# Patient Record
Sex: Female | Born: 1955 | Race: Black or African American | Hispanic: No | Marital: Single | Smoking: Never smoker
Health system: Southern US, Community
[De-identification: ages and names within clinical notes are randomized; demographics above are authoritative.]

## PROBLEM LIST (undated history)

## (undated) DIAGNOSIS — M722 Plantar fascial fibromatosis: Secondary | ICD-10-CM

## (undated) DIAGNOSIS — A64 Unspecified sexually transmitted disease: Secondary | ICD-10-CM

## (undated) DIAGNOSIS — E119 Type 2 diabetes mellitus without complications: Secondary | ICD-10-CM

## (undated) DIAGNOSIS — H269 Unspecified cataract: Secondary | ICD-10-CM

## (undated) DIAGNOSIS — R87619 Unspecified abnormal cytological findings in specimens from cervix uteri: Secondary | ICD-10-CM

## (undated) DIAGNOSIS — E785 Hyperlipidemia, unspecified: Secondary | ICD-10-CM

## (undated) DIAGNOSIS — Z9221 Personal history of antineoplastic chemotherapy: Secondary | ICD-10-CM

## (undated) DIAGNOSIS — Z923 Personal history of irradiation: Secondary | ICD-10-CM

## (undated) DIAGNOSIS — D649 Anemia, unspecified: Secondary | ICD-10-CM

## (undated) HISTORY — DX: Unspecified sexually transmitted disease: A64

## (undated) HISTORY — PX: COLONOSCOPY: SHX174

## (undated) HISTORY — DX: Unspecified abnormal cytological findings in specimens from cervix uteri: R87.619

## (undated) HISTORY — DX: Unspecified cataract: H26.9

## (undated) HISTORY — DX: Hyperlipidemia, unspecified: E78.5

## (undated) HISTORY — PX: EYE SURGERY: SHX253

## (undated) HISTORY — DX: Plantar fascial fibromatosis: M72.2

## (undated) HISTORY — DX: Anemia, unspecified: D64.9

## (undated) HISTORY — DX: Type 2 diabetes mellitus without complications: E11.9

---

## 1993-07-12 DIAGNOSIS — O24419 Gestational diabetes mellitus in pregnancy, unspecified control: Secondary | ICD-10-CM

## 1997-09-26 ENCOUNTER — Encounter: Admission: RE | Admit: 1997-09-26 | Discharge: 1997-12-25 | Payer: Self-pay | Admitting: *Deleted

## 1997-10-10 ENCOUNTER — Encounter: Admission: RE | Admit: 1997-10-10 | Discharge: 1997-10-10 | Payer: Self-pay | Admitting: Family Medicine

## 1997-10-11 ENCOUNTER — Ambulatory Visit (HOSPITAL_COMMUNITY): Admission: RE | Admit: 1997-10-11 | Discharge: 1997-10-11 | Payer: Self-pay | Admitting: *Deleted

## 1998-01-14 ENCOUNTER — Encounter: Admission: RE | Admit: 1998-01-14 | Discharge: 1998-01-14 | Payer: Self-pay | Admitting: Family Medicine

## 1998-01-31 ENCOUNTER — Encounter: Admission: RE | Admit: 1998-01-31 | Discharge: 1998-01-31 | Payer: Self-pay | Admitting: Family Medicine

## 1998-05-21 ENCOUNTER — Encounter: Admission: RE | Admit: 1998-05-21 | Discharge: 1998-05-21 | Payer: Self-pay | Admitting: Family Medicine

## 1998-10-16 ENCOUNTER — Encounter: Admission: RE | Admit: 1998-10-16 | Discharge: 1998-10-16 | Payer: Self-pay | Admitting: Family Medicine

## 1998-12-05 ENCOUNTER — Encounter: Admission: RE | Admit: 1998-12-05 | Discharge: 1998-12-05 | Payer: Self-pay | Admitting: Family Medicine

## 1999-01-30 ENCOUNTER — Encounter: Admission: RE | Admit: 1999-01-30 | Discharge: 1999-01-30 | Payer: Self-pay | Admitting: Family Medicine

## 1999-02-04 ENCOUNTER — Encounter (INDEPENDENT_AMBULATORY_CARE_PROVIDER_SITE_OTHER): Payer: Self-pay | Admitting: Specialist

## 1999-02-04 ENCOUNTER — Other Ambulatory Visit: Admission: RE | Admit: 1999-02-04 | Discharge: 1999-02-04 | Payer: Self-pay | Admitting: Obstetrics and Gynecology

## 1999-02-04 HISTORY — PX: COLPOSCOPY W/ BIOPSY / CURETTAGE: SUR283

## 1999-03-10 ENCOUNTER — Encounter: Admission: RE | Admit: 1999-03-10 | Discharge: 1999-03-10 | Payer: Self-pay | Admitting: Family Medicine

## 1999-07-20 ENCOUNTER — Encounter: Payer: Self-pay | Admitting: Obstetrics and Gynecology

## 1999-07-20 ENCOUNTER — Ambulatory Visit (HOSPITAL_COMMUNITY): Admission: RE | Admit: 1999-07-20 | Discharge: 1999-07-20 | Payer: Self-pay | Admitting: Obstetrics and Gynecology

## 1999-11-17 ENCOUNTER — Other Ambulatory Visit: Admission: RE | Admit: 1999-11-17 | Discharge: 1999-11-17 | Payer: Self-pay | Admitting: *Deleted

## 1999-11-26 ENCOUNTER — Other Ambulatory Visit: Admission: RE | Admit: 1999-11-26 | Discharge: 1999-11-26 | Payer: Self-pay | Admitting: Obstetrics and Gynecology

## 2000-02-02 ENCOUNTER — Encounter: Admission: RE | Admit: 2000-02-02 | Discharge: 2000-02-02 | Payer: Self-pay | Admitting: Family Medicine

## 2000-02-02 ENCOUNTER — Ambulatory Visit (HOSPITAL_COMMUNITY): Admission: RE | Admit: 2000-02-02 | Discharge: 2000-02-02 | Payer: Self-pay | Admitting: Family Medicine

## 2000-02-03 ENCOUNTER — Encounter: Admission: RE | Admit: 2000-02-03 | Discharge: 2000-02-03 | Payer: Self-pay | Admitting: *Deleted

## 2000-02-03 ENCOUNTER — Encounter: Payer: Self-pay | Admitting: *Deleted

## 2000-02-24 ENCOUNTER — Encounter: Payer: Self-pay | Admitting: Cardiology

## 2000-02-24 ENCOUNTER — Ambulatory Visit (HOSPITAL_COMMUNITY): Admission: RE | Admit: 2000-02-24 | Discharge: 2000-02-24 | Payer: Self-pay | Admitting: Cardiology

## 2000-04-13 ENCOUNTER — Encounter: Admission: RE | Admit: 2000-04-13 | Discharge: 2000-04-13 | Payer: Self-pay | Admitting: Family Medicine

## 2000-05-30 ENCOUNTER — Encounter: Admission: RE | Admit: 2000-05-30 | Discharge: 2000-05-30 | Payer: Self-pay | Admitting: Family Medicine

## 2000-07-26 ENCOUNTER — Other Ambulatory Visit: Admission: RE | Admit: 2000-07-26 | Discharge: 2000-07-26 | Payer: Self-pay | Admitting: *Deleted

## 2000-09-12 ENCOUNTER — Encounter: Admission: RE | Admit: 2000-09-12 | Discharge: 2000-09-12 | Payer: Self-pay | Admitting: Family Medicine

## 2000-11-01 ENCOUNTER — Other Ambulatory Visit: Admission: RE | Admit: 2000-11-01 | Discharge: 2000-11-01 | Payer: Self-pay | Admitting: Obstetrics and Gynecology

## 2000-11-16 ENCOUNTER — Encounter: Admission: RE | Admit: 2000-11-16 | Discharge: 2000-11-16 | Payer: Self-pay | Admitting: Family Medicine

## 2001-06-07 ENCOUNTER — Encounter: Admission: RE | Admit: 2001-06-07 | Discharge: 2001-06-07 | Payer: Self-pay | Admitting: Family Medicine

## 2001-06-08 ENCOUNTER — Encounter: Admission: RE | Admit: 2001-06-08 | Discharge: 2001-06-08 | Payer: Self-pay | Admitting: Family Medicine

## 2001-06-13 ENCOUNTER — Encounter: Admission: RE | Admit: 2001-06-13 | Discharge: 2001-06-13 | Payer: Self-pay | Admitting: Family Medicine

## 2001-09-15 ENCOUNTER — Encounter: Admission: RE | Admit: 2001-09-15 | Discharge: 2001-09-15 | Payer: Self-pay | Admitting: Family Medicine

## 2001-10-26 ENCOUNTER — Encounter (INDEPENDENT_AMBULATORY_CARE_PROVIDER_SITE_OTHER): Payer: Self-pay | Admitting: *Deleted

## 2001-10-26 LAB — CONVERTED CEMR LAB

## 2001-11-03 ENCOUNTER — Other Ambulatory Visit: Admission: RE | Admit: 2001-11-03 | Discharge: 2001-11-03 | Payer: Self-pay | Admitting: Obstetrics and Gynecology

## 2001-11-30 ENCOUNTER — Encounter: Admission: RE | Admit: 2001-11-30 | Discharge: 2001-11-30 | Payer: Self-pay | Admitting: Family Medicine

## 2001-12-06 ENCOUNTER — Encounter: Admission: RE | Admit: 2001-12-06 | Discharge: 2002-03-06 | Payer: Self-pay | Admitting: Family Medicine

## 2002-04-09 ENCOUNTER — Encounter: Admission: RE | Admit: 2002-04-09 | Discharge: 2002-04-09 | Payer: Self-pay | Admitting: Family Medicine

## 2002-08-23 ENCOUNTER — Encounter: Admission: RE | Admit: 2002-08-23 | Discharge: 2002-08-23 | Payer: Self-pay | Admitting: Family Medicine

## 2003-02-13 ENCOUNTER — Other Ambulatory Visit: Admission: RE | Admit: 2003-02-13 | Discharge: 2003-02-13 | Payer: Self-pay | Admitting: Obstetrics and Gynecology

## 2003-05-13 ENCOUNTER — Encounter: Admission: RE | Admit: 2003-05-13 | Discharge: 2003-05-13 | Payer: Self-pay | Admitting: Family Medicine

## 2003-05-25 ENCOUNTER — Emergency Department (HOSPITAL_COMMUNITY): Admission: EM | Admit: 2003-05-25 | Discharge: 2003-05-25 | Payer: Self-pay | Admitting: Emergency Medicine

## 2003-05-27 ENCOUNTER — Encounter: Admission: RE | Admit: 2003-05-27 | Discharge: 2003-05-27 | Payer: Self-pay | Admitting: Family Medicine

## 2003-06-14 ENCOUNTER — Encounter: Admission: RE | Admit: 2003-06-14 | Discharge: 2003-06-14 | Payer: Self-pay | Admitting: Sports Medicine

## 2003-07-15 ENCOUNTER — Encounter: Admission: RE | Admit: 2003-07-15 | Discharge: 2003-07-15 | Payer: Self-pay | Admitting: Family Medicine

## 2003-07-17 ENCOUNTER — Encounter: Admission: RE | Admit: 2003-07-17 | Discharge: 2003-07-17 | Payer: Self-pay | Admitting: Family Medicine

## 2003-07-18 ENCOUNTER — Encounter: Admission: RE | Admit: 2003-07-18 | Discharge: 2003-07-18 | Payer: Self-pay | Admitting: Family Medicine

## 2003-07-26 ENCOUNTER — Encounter: Admission: RE | Admit: 2003-07-26 | Discharge: 2003-07-26 | Payer: Self-pay | Admitting: Sports Medicine

## 2003-09-06 ENCOUNTER — Encounter: Admission: RE | Admit: 2003-09-06 | Discharge: 2003-09-06 | Payer: Self-pay | Admitting: Family Medicine

## 2004-03-20 ENCOUNTER — Other Ambulatory Visit: Admission: RE | Admit: 2004-03-20 | Discharge: 2004-03-20 | Payer: Self-pay | Admitting: Obstetrics and Gynecology

## 2004-07-01 ENCOUNTER — Ambulatory Visit: Payer: Self-pay | Admitting: Internal Medicine

## 2004-08-19 ENCOUNTER — Ambulatory Visit (HOSPITAL_COMMUNITY): Admission: RE | Admit: 2004-08-19 | Discharge: 2004-08-19 | Payer: Self-pay | Admitting: Internal Medicine

## 2004-08-19 ENCOUNTER — Ambulatory Visit: Payer: Self-pay | Admitting: *Deleted

## 2005-03-22 ENCOUNTER — Other Ambulatory Visit: Admission: RE | Admit: 2005-03-22 | Discharge: 2005-03-22 | Payer: Self-pay | Admitting: Obstetrics and Gynecology

## 2006-03-24 ENCOUNTER — Other Ambulatory Visit: Admission: RE | Admit: 2006-03-24 | Discharge: 2006-03-24 | Payer: Self-pay | Admitting: Obstetrics & Gynecology

## 2006-08-25 DIAGNOSIS — R519 Headache, unspecified: Secondary | ICD-10-CM | POA: Insufficient documentation

## 2006-08-25 DIAGNOSIS — E119 Type 2 diabetes mellitus without complications: Secondary | ICD-10-CM | POA: Insufficient documentation

## 2006-08-25 DIAGNOSIS — R51 Headache: Secondary | ICD-10-CM | POA: Insufficient documentation

## 2006-08-25 DIAGNOSIS — E669 Obesity, unspecified: Secondary | ICD-10-CM | POA: Insufficient documentation

## 2006-08-25 DIAGNOSIS — D62 Acute posthemorrhagic anemia: Secondary | ICD-10-CM | POA: Insufficient documentation

## 2006-08-25 DIAGNOSIS — F339 Major depressive disorder, recurrent, unspecified: Secondary | ICD-10-CM | POA: Insufficient documentation

## 2006-08-26 ENCOUNTER — Encounter (INDEPENDENT_AMBULATORY_CARE_PROVIDER_SITE_OTHER): Payer: Self-pay | Admitting: *Deleted

## 2007-05-19 ENCOUNTER — Ambulatory Visit (HOSPITAL_COMMUNITY): Admission: RE | Admit: 2007-05-19 | Discharge: 2007-05-19 | Payer: Self-pay | Admitting: Obstetrics

## 2010-04-13 ENCOUNTER — Encounter
Admission: RE | Admit: 2010-04-13 | Discharge: 2010-04-13 | Payer: Self-pay | Source: Home / Self Care | Attending: Internal Medicine | Admitting: Internal Medicine

## 2012-06-28 DIAGNOSIS — R87619 Unspecified abnormal cytological findings in specimens from cervix uteri: Secondary | ICD-10-CM

## 2012-06-28 HISTORY — DX: Unspecified abnormal cytological findings in specimens from cervix uteri: R87.619

## 2012-09-27 ENCOUNTER — Telehealth: Payer: Self-pay | Admitting: *Deleted

## 2012-09-27 MED ORDER — VALACYCLOVIR HCL 500 MG PO TABS: 500.0000 mg | ORAL_TABLET | Freq: Two times a day (BID) | ORAL | Status: DC

## 2012-09-27 NOTE — Telephone Encounter (Signed)
Last seen in office 07/2011

## 2012-09-29 NOTE — Telephone Encounter (Signed)
Pt came to office to follow up on her phone request- patient has scheduled an appointment and rx has been called to the pharmacy.

## 2012-11-02 ENCOUNTER — Ambulatory Visit: Payer: Self-pay | Admitting: Obstetrics

## 2012-12-21 ENCOUNTER — Ambulatory Visit (INDEPENDENT_AMBULATORY_CARE_PROVIDER_SITE_OTHER): Payer: 59 | Admitting: Obstetrics

## 2012-12-21 ENCOUNTER — Encounter: Payer: Self-pay | Admitting: Obstetrics

## 2012-12-21 VITALS — BP 126/78 | HR 82 | Temp 97.9°F | Ht 64.0 in | Wt 180.0 lb

## 2012-12-21 DIAGNOSIS — Z01419 Encounter for gynecological examination (general) (routine) without abnormal findings: Secondary | ICD-10-CM

## 2012-12-21 DIAGNOSIS — A6000 Herpesviral infection of urogenital system, unspecified: Secondary | ICD-10-CM | POA: Insufficient documentation

## 2012-12-21 DIAGNOSIS — N760 Acute vaginitis: Secondary | ICD-10-CM

## 2012-12-21 MED ORDER — VALACYCLOVIR HCL 500 MG PO TABS: 500.0000 mg | ORAL_TABLET | Freq: Two times a day (BID) | ORAL | Status: DC

## 2012-12-21 NOTE — Progress Notes (Signed)
.   Subjective:     Rachel Gaines is a 57 y.o. female here for a routine exam.  Current complaints: slight vaginal itching.  Personal health questionnaire reviewed: yes.   Gynecologic History No LMP recorded. Patient is postmenopausal. Contraception: none Last Pap: 07/2011 . Results were: normal Last mammogram: 2013. Results were: normal  Obstetric History OB History   Grav Para Term Preterm Abortions TAB SAB Ect Mult Living                   The following portions of the patient's history were reviewed and updated as appropriate: allergies, current medications, past family history, past medical history, past social history, past surgical history and problem list.  Review of Systems Pertinent items are noted in HPI.    Objective:    General appearance: alert and no distress Breasts: normal appearance, no masses or tenderness Abdomen: normal findings: soft, non-tender Pelvic: cervix normal in appearance, external genitalia normal, no adnexal masses or tenderness, no cervical motion tenderness, uterus normal size, shape, and consistency and vagina normal without discharge    Assessment:    Healthy female exam.    ?Trichomonas.  Will check Affirm.   Plan:    Education reviewed: safe sex/STD prevention and self breast exams. Follow up in: 1 year. Valtrex Rx renewed.

## 2012-12-21 NOTE — Addendum Note (Signed)
Addended by: Julaine Hua on: 12/21/2012 06:11 PM   Modules accepted: Orders

## 2012-12-22 LAB — PAP IG W/ RFLX HPV ASCU

## 2012-12-22 LAB — WET PREP BY MOLECULAR PROBE
Candida species: NEGATIVE
Gardnerella vaginalis: POSITIVE — AB
Trichomonas vaginosis: NEGATIVE

## 2012-12-25 LAB — HUMAN PAPILLOMAVIRUS, HIGH RISK: HPV DNA High Risk: NOT DETECTED

## 2013-12-03 ENCOUNTER — Ambulatory Visit: Payer: Self-pay | Admitting: Dietician

## 2014-01-25 ENCOUNTER — Ambulatory Visit: Payer: 59

## 2014-02-08 ENCOUNTER — Encounter: Payer: Self-pay | Admitting: Podiatry

## 2014-02-08 ENCOUNTER — Ambulatory Visit (INDEPENDENT_AMBULATORY_CARE_PROVIDER_SITE_OTHER): Payer: 59 | Admitting: Podiatry

## 2014-02-08 ENCOUNTER — Ambulatory Visit (INDEPENDENT_AMBULATORY_CARE_PROVIDER_SITE_OTHER): Payer: 59

## 2014-02-08 VITALS — BP 88/48 | HR 85 | Resp 18

## 2014-02-08 DIAGNOSIS — M7660 Achilles tendinitis, unspecified leg: Secondary | ICD-10-CM

## 2014-02-08 DIAGNOSIS — M7661 Achilles tendinitis, right leg: Secondary | ICD-10-CM

## 2014-02-08 DIAGNOSIS — M898X9 Other specified disorders of bone, unspecified site: Secondary | ICD-10-CM

## 2014-02-08 DIAGNOSIS — B351 Tinea unguium: Secondary | ICD-10-CM

## 2014-02-08 DIAGNOSIS — R52 Pain, unspecified: Secondary | ICD-10-CM

## 2014-02-08 MED ORDER — TRIAMCINOLONE ACETONIDE 10 MG/ML IJ SUSP
2.5000 mg | Freq: Once | INTRAMUSCULAR | Status: AC
  Administered 2014-02-08: 2.5 mg

## 2014-02-08 NOTE — Patient Instructions (Signed)
Achilles Tendinitis Achilles tendinitis is inflammation of the tough, cord-like band that attaches the lower muscles of your leg to your heel (Achilles tendon). It is usually caused by overusing the tendon and joint involved.  CAUSES Achilles tendinitis can happen because of:  A sudden increase in exercise or activity (such as running).  Doing the same exercises or activities (such as jumping) over and over.  Not warming up calf muscles before exercising.  Exercising in shoes that are worn out or not made for exercise.  Having arthritis or a bone growth on the back of the heel bone. This can rub against the tendon and hurt the tendon. SIGNS AND SYMPTOMS The most common symptoms are:  Pain in the back of the leg, just above the heel. The pain usually gets worse with exercise and better with rest.  Stiffness or soreness in the back of the leg, especially in the morning.  Swelling of the skin over the Achilles tendon.  Trouble standing on tiptoe. Sometimes, an Achilles tendon tears (ruptures). Symptoms of an Achilles tendon rupture can include:  Sudden, severe pain in the back of the leg.  Trouble putting weight on the foot or walking normally. DIAGNOSIS Achilles tendinitis will be diagnosed based on symptoms and a physical examination. An X-ray may be done to check if another condition is causing your symptoms. An MRI may be ordered if your health care provider suspects you may have completely torn your tendon, which is called an Achilles tendon rupture.  TREATMENT  Achilles tendinitis usually gets better over time. It can take weeks to months to heal completely. Treatment focuses on treating the symptoms and helping the injury heal. HOME CARE INSTRUCTIONS   Rest your Achilles tendon and avoid activities that cause pain.  Apply ice to the injured area:  Put ice in a plastic bag.  Place a towel between your skin and the bag.  Leave the ice on for 20 minutes, 2-3 times a  day  Try to avoid using the tendon (other than gentle range of motion) while the tendon is painful. Do not resume use until instructed by your health care provider. Then begin use gradually. Do not increase use to the point of pain. If pain does develop, decrease use and continue the above measures. Gradually increase activities that do not cause discomfort until you achieve normal use.  Do exercises to make your calf muscles stronger and more flexible. Your health care provider or physical therapist can recommend exercises for you to do.  Wrap your ankle with an elastic bandage or other wrap. This can help keep your tendon from moving too much. Your health care provider will show you how to wrap your ankle correctly.  Only take over-the-counter or prescription medicines for pain, discomfort, or fever as directed by your health care provider. SEEK MEDICAL CARE IF:   Your pain and swelling increase or pain is uncontrolled with medicines.  You develop new, unexplained symptoms or your symptoms get worse.  You are unable to move your toes or foot.  You develop warmth and swelling in your foot.  You have an unexplained temperature. MAKE SURE YOU:   Understand these instructions.  Will watch your condition.  Will get help right away if you are not doing well or get worse. Document Released: 03/24/2005 Document Revised: 04/04/2013 Document Reviewed: 01/24/2013 ExitCare Patient Information 2015 [employer]. This information is not intended to replace advice given to you by your health care provider. Make sure you discuss   any questions you have with your health care provider.  

## 2014-02-08 NOTE — Progress Notes (Signed)
   Subjective:    Patient ID: Rachel Gaines, female    DOB: 01/25/1956, 58 y.o.   MRN: [id number]  HPI  Patient presents the office today with complaints of right heel pain on the back of her heel as well as a painful bump on the dorsal lateral aspect of her right foot the.  She states that the heel pain his been present for approximately 3 months feels like it throbs and is tender to touch. She states that the lump on the top of her foot has been present for years but has been eating bigger over the last 2 months. She states that the lump has been getting more painful as well. Pain is worse with shoe gear and pressure over the area. She also has complaints of elongated nails which are growing on a curve. She is diabetic and states that his sugar runs in the low 100s..No other complaints at this time    Review of Systems  Eyes: Positive for itching.  All other systems reviewed and are negative.      Objective:   Physical Exam AAOx3, NAD DP/PT pulses palpable b/l 2/4, CRT < 3 sec.  Protective sensation intact with the Semmes Weinstein monofilament vibratory sensation intact. Pain on the posterior aspect of the right heel at the insertion of the Achilles tendon. No pain on the plantar aspect of the calcaneus. Pain with lateral compression. Equinus b/l. Achilles tendon intact. Small dorsal exostosis noted over the dorsolateral aspect of the right foot over the area of the fourth metatarsal base. There is a small amount edema overlying the exostosis tenderness to palpation.  No open skin lesions. Nails hypertrophic, elongated with yellow discoloration to bilateral hallux. No clinical signs of infection.      Assessment & Plan:  58-year-old female with right insertional Achilles tendinitis, painful dorsolateral exostosis right foot -Nails are sharply debrided without complications x2. -For the insertional Achilles tendinitis, recommended a CAM boot to be worn to help offload the area and  decrease the inflammation however the patient does not want at this time. States she "may" have a boot at home, but does not want to wear it. Dispensed heel lifts to be worn in a supportive sneaker. Ice. -Dorsal exostosis- both inserted and surgical intervention was discussed in detail including alternatives, risks, complications. Due to  inflammation overlying exostosis recommended a small amount of steroid to help with her symptoms. Discussed with the patient in detail that steroid could elevate her sugar, and it is important to monitor it closely. Under sterile skin preparation a total of 0.7 cc of a 50-50 mixture of Kenalog, Marcaine 0.5% plain, lidocaine 2% plain was infiltrated over the dorsal exostosis without any complications. Patient tolerated the injection well. -Followup in one month or sooner any problems are to arise.

## 2014-06-24 ENCOUNTER — Telehealth: Payer: Self-pay | Admitting: *Deleted

## 2014-06-24 NOTE — Telephone Encounter (Signed)
"  I want to find out if there's anything I can put on my great toe, I have an ingrown toenail.  It's very painful.  My appointment is not until January 8th.  I need a call back."  I attempted to return her call.  Her mailbox was full so I couldn't leave a message.  I was going to advise her to soak in warm Betadine or Epsom Salt water twice a day for 15 minutes each time and apply a bandaid with triple antibiotic ointment until scheduled appointment.

## 2014-06-27 NOTE — Telephone Encounter (Signed)
I left patient a message about soaking instructions and dressings.  Call if you have any further questions or concerns.

## 2014-07-05 ENCOUNTER — Ambulatory Visit (INDEPENDENT_AMBULATORY_CARE_PROVIDER_SITE_OTHER): Payer: 59 | Admitting: Podiatry

## 2014-07-05 ENCOUNTER — Encounter: Payer: Self-pay | Admitting: Podiatry

## 2014-07-05 VITALS — BP 106/61 | HR 88 | Resp 13

## 2014-07-05 DIAGNOSIS — M79609 Pain in unspecified limb: Secondary | ICD-10-CM

## 2014-07-05 DIAGNOSIS — L600 Ingrowing nail: Secondary | ICD-10-CM

## 2014-07-05 DIAGNOSIS — M7661 Achilles tendinitis, right leg: Secondary | ICD-10-CM

## 2014-07-05 DIAGNOSIS — B351 Tinea unguium: Secondary | ICD-10-CM

## 2014-07-05 MED ORDER — DICLOFENAC SODIUM 1 % TD GEL: 2.0000 g | Freq: Four times a day (QID) | TRANSDERMAL | Status: DC

## 2014-07-05 MED ORDER — TAVABOROLE 5 % EX SOLN: 1.0000 [drp] | CUTANEOUS | Status: DC

## 2014-07-05 NOTE — Patient Instructions (Signed)
Achilles Tendinitis   with Rehab  Achilles tendinitis is a disorder of the Achilles tendon. The Achilles tendon connects the large calf muscles (Gastrocnemius and Soleus) to the heel bone (calcaneus). This tendon is sometimes called the heel cord. It is important for pushing-off and standing on your toes and is important for walking, running, or jumping. Tendinitis is often caused by overuse and repetitive microtrauma.  SYMPTOMS  · Pain, tenderness, swelling, warmth, and redness may occur over the Achilles tendon even at rest.  · Pain with pushing off, or flexing or extending the ankle.  · Pain that is worsened after or during activity.  CAUSES   · Overuse sometimes seen with rapid increase in exercise programs or in sports requiring running and jumping.  · Poor physical conditioning (strength and flexibility or endurance).  · Running sports, especially training running down hills.  · Inadequate warm-up before practice or play or failure to stretch before participation.  · Injury to the tendon.  PREVENTION   · Warm up and stretch before practice or competition.  · Allow time for adequate rest and recovery between practices and competition.  · Keep up conditioning.  ¨ Keep up ankle and leg flexibility.  ¨ Improve or keep muscle strength and endurance.  ¨ Improve cardiovascular fitness.  · Use proper technique.  · Use proper equipment (shoes, skates).  · To help prevent recurrence, taping, protective strapping, or an adhesive bandage may be recommended for several weeks after healing is complete.  PROGNOSIS   · Recovery may take weeks to several months to heal.  · Longer recovery is expected if symptoms have been prolonged.  · Recovery is usually quicker if the inflammation is due to a direct blow as compared with overuse or sudden strain.  RELATED COMPLICATIONS   · Healing time will be prolonged if the condition is not correctly treated. The injury must be given plenty of time to heal.  · Symptoms can reoccur if  activity is resumed too soon.  · Untreated, tendinitis may increase the risk of tendon rupture requiring additional time for recovery and possibly surgery.  TREATMENT   · The first treatment consists of rest anti-inflammatory medication, and ice to relieve the pain.  · Stretching and strengthening exercises after resolution of pain will likely help reduce the risk of recurrence. Referral to a physical therapist or athletic trainer for further evaluation and treatment may be helpful.  · A walking boot or cast may be recommended to rest the Achilles tendon. This can help break the cycle of inflammation and microtrauma.  · Arch supports (orthotics) may be prescribed or recommended by your caregiver as an adjunct to therapy and rest.  · Surgery to remove the inflamed tendon lining or degenerated tendon tissue is rarely necessary and has shown less than predictable results.  MEDICATION   · Nonsteroidal anti-inflammatory medications, such as aspirin and ibuprofen, may be used for pain and inflammation relief. Do not take within 7 days before surgery. Take these as directed by your caregiver. Contact your caregiver immediately if any bleeding, stomach upset, or signs of allergic reaction occur. Other minor pain relievers, such as acetaminophen, may also be used.  · Pain relievers may be prescribed as necessary by your caregiver. Do not take prescription pain medication for longer than 4 to 7 days. Use only as directed and only as much as you need.  · Cortisone injections are rarely indicated. Cortisone injections may weaken tendons and predispose to rupture. It is better   to give the condition more time to heal than to use them.  HEAT AND COLD  · Cold is used to relieve pain and reduce inflammation for acute and chronic Achilles tendinitis. Cold should be applied for 10 to 15 minutes every 2 to 3 hours for inflammation and pain and immediately after any activity that aggravates your symptoms. Use ice packs or an ice  massage.  · Heat may be used before performing stretching and strengthening activities prescribed by your caregiver. Use a heat pack or a warm soak.  SEEK MEDICAL CARE IF:  · Symptoms get worse or do not improve in 2 weeks despite treatment.  · New, unexplained symptoms develop. Drugs used in treatment may produce side effects.  EXERCISES  RANGE OF MOTION (ROM) AND STRETCHING EXERCISES - Achilles Tendinitis   These exercises may help you when beginning to rehabilitate your injury. Your symptoms may resolve with or without further involvement from your physician, physical therapist or athletic trainer. While completing these exercises, remember:   · Restoring tissue flexibility helps normal motion to return to the joints. This allows healthier, less painful movement and activity.  · An effective stretch should be held for at least 30 seconds.  · A stretch should never be painful. You should only feel a gentle lengthening or release in the stretched tissue.  STRETCH - Gastroc, Standing   · Place hands on wall.  · Extend right / left leg, keeping the front knee somewhat bent.  · Slightly point your toes inward on your back foot.  · Keeping your right / left heel on the floor and your knee straight, shift your weight toward the wall, not allowing your back to arch.  · You should feel a gentle stretch in the right / left calf. Hold this position for __________ seconds.  Repeat __________ times. Complete this stretch __________ times per day.  STRETCH - Soleus, Standing   · Place hands on wall.  · Extend right / left leg, keeping the other knee somewhat bent.  · Slightly point your toes inward on your back foot.  · Keep your right / left heel on the floor, bend your back knee, and slightly shift your weight over the back leg so that you feel a gentle stretch deep in your back calf.  · Hold this position for __________ seconds.  Repeat __________ times. Complete this stretch __________ times per day.  STRETCH -  Gastrocsoleus, Standing   Note: This exercise can place a lot of stress on your foot and ankle. Please complete this exercise only if specifically instructed by your caregiver.   · Place the ball of your right / left foot on a step, keeping your other foot firmly on the same step.  · Hold on to the wall or a rail for balance.  · Slowly lift your other foot, allowing your body weight to press your heel down over the edge of the step.  · You should feel a stretch in your right / left calf.  · Hold this position for __________ seconds.  · Repeat this exercise with a slight bend in your knee.  Repeat __________ times. Complete this stretch __________ times per day.   STRENGTHENING EXERCISES - Achilles Tendinitis  These exercises may help you when beginning to rehabilitate your injury. They may resolve your symptoms with or without further involvement from your physician, physical therapist or athletic trainer. While completing these exercises, remember:   · Muscles can gain both the endurance   and the strength needed for everyday activities through controlled exercises.  · Complete these exercises as instructed by your physician, physical therapist or athletic trainer. Progress the resistance and repetitions only as guided.  · You may experience muscle soreness or fatigue, but the pain or discomfort you are trying to eliminate should never worsen during these exercises. If this pain does worsen, stop and make certain you are following the directions exactly. If the pain is still present after adjustments, discontinue the exercise until you can discuss the trouble with your clinician.  STRENGTH - Plantar-flexors   · Sit with your right / left leg extended. Holding onto both ends of a rubber exercise band/tubing, loop it around the ball of your foot. Keep a slight tension in the band.  · Slowly push your toes away from you, pointing them downward.  · Hold this position for __________ seconds. Return slowly, controlling the  tension in the band/tubing.  Repeat __________ times. Complete this exercise __________ times per day.   STRENGTH - Plantar-flexors   · Stand with your feet shoulder width apart. Steady yourself with a wall or table using as little support as needed.  · Keeping your weight evenly spread over the width of your feet, rise up on your toes.*  · Hold this position for __________ seconds.  Repeat __________ times. Complete this exercise __________ times per day.   *If this is too easy, shift your weight toward your right / left leg until you feel challenged. Ultimately, you may be asked to do this exercise with your right / left foot only.  STRENGTH - Plantar-flexors, Eccentric   Note: This exercise can place a lot of stress on your foot and ankle. Please complete this exercise only if specifically instructed by your caregiver.   · Place the balls of your feet on a step. With your hands, use only enough support from a wall or rail to keep your balance.  · Keep your knees straight and rise up on your toes.  · Slowly shift your weight entirely to your right / left toes and pick up your opposite foot. Gently and with controlled movement, lower your weight through your right / left foot so that your heel drops below the level of the step. You will feel a slight stretch in the back of your calf at the end position.  · Use the healthy leg to help rise up onto the balls of both feet, then lower weight only on the right / left leg again. Build up to 15 repetitions. Then progress to 3 consecutive sets of 15 repetitions.*  · After completing the above exercise, complete the same exercise with a slight knee bend (about 30 degrees). Again, build up to 15 repetitions. Then progress to 3 consecutive sets of 15 repetitions.*  Perform this exercise __________ times per day.   *When you easily complete 3 sets of 15, your physician, physical therapist or athletic trainer may advise you to add resistance by wearing a backpack filled with  additional weight.  STRENGTH - Plantar Flexors, Seated   · Sit on a chair that allows your feet to rest flat on the ground. If necessary, sit at the edge of the chair.  · Keeping your toes firmly on the ground, lift your right / left heel as far as you can without increasing any discomfort in your ankle.  Repeat __________ times. Complete this exercise __________ times a day.  *If instructed by your physician, physical therapist or athletic   trainer, you may add ____________________ of resistance by placing a weighted object on your right / left knee.  Document Released: 01/13/2005 Document Revised: 09/06/2011 Document Reviewed: 09/26/2008  Exit[employer]® Patient Information ©2015 [employer]. This information is not intended to replace advice given to you by your health care provider. Make sure you discuss any questions you have with your health care provider.

## 2014-07-08 NOTE — Progress Notes (Signed)
Patient ID: Rachel Gaines, female   DOB: 1955-10-25, 59 y.o.   MRN: [id number]  Subjective: 59 year old female presents the office today with complaints of her right big toe nail becoming increasingly painful as well as continued pain into the right heel. She states that the exostosis off the dorsal aspect of the right foot symptoms have resolved since the injection last appointment. She states that the pain in the back of her heel continues although it has decreased. She has been continuing the stretching exercises as well as ice to the area. In regards to left big toenail pain she was at the area has been ingrown. She has noticed that the nails have become thickened increasingly painful. She denies any recent redness or drainage from the nail sites. No other complaints at this time in no acute changes since last appointment. Denies any systemic complaints as fevers, chills, nausea, vomiting.  Objective: AAO x3, NAD DP/PT pulses palpable bilaterally, CRT less than 3 seconds Protective sensation intact with Simms Weinstein monofilament, vibratory sensation intact, Achilles tendon reflex intact Bilateral hallux nails are hypertrophic, dystrophic, brittle, discolored. There is evidence of incurvation on the medial nail borders. There is no site erythema or drainage around these areas. There is mild tenderness to palpation directly overlying the nail borders distally however there is no pain on the proximal nail borders. There was also found to be significant subungual debris to bilateral hallux nails. Bilateral hallux nails appear to be loose from the underlying nailbed distally and adhered proximally. Fifth digit nails are also hypertrophic, dystrophic, discolored. There is no surrounding erythema or drainage from the nail sites. There is mild tenderness to palpation overlying the right posterior aspect of the ankle along the insertion of the Achilles tendon onto the calcaneus. There is no tenderness on the  mid symptoms of the Achilles tendon. The tendon appears to be intact. There is no overlying edema, erythema, increase in warmth to this area. There is no pinpoint bony tenderness or pain with vibratory sensation to bilateral lower extremities. MMT 5/5, ROM WNL No pain with calf compression, swelling, warmth, erythema. No open lesions or pre-ulcerative lesions identified.  Assessment: 59 year old female with right insertional Achilles tendinitis; symptomatic onychomycosis/ingrown toenail.  Plan: -Treatment options were discussed the patient including alternatives, risks, competitions. -Currently, there is evidence of onychomycosis bilateral hallux and fifth digit toenails. Bilateral hallux nails or loosening underlying nailbed distally and there is evidence of incurvation on the distal nail borders. The nails are sharply debrided without complication/bleeding to patient comfort. Discussed the areas treatment options for onychomycosis. At this time the patient elects to proceed with treatment and she has decided on topical treatment. The prescription for [place] Mon was [facility] the patient was given information to follow-up at the pharmacy. Side effects the medication were discussed the patient and directed to call the office should any occur. -Discussed that if the ingrown portion of nail remains symptomatic may need a partial nail avulsion performed. -In regards the right insertional Achilles tendinitis pain discussed to continue stretching exercises and ice to the area. Continue heel lifts as needed. Prescribed voltaren gel. Discussed shoe gear modifications. -Follow-up in 3 months or sooner should any problems arise. In the meantime, encouraged to call the office with any questions, concerns, change in symptoms.

## 2014-08-02 ENCOUNTER — Telehealth: Payer: Self-pay

## 2014-08-02 NOTE — Telephone Encounter (Signed)
Faxed medical records of last PAP, mammograms, and office notes to [facility] [phone] as requested

## 2014-09-30 NOTE — Patient Outreach (Signed)
[facility]) Care Management  09/30/2014  Rachel Gaines 1956/01/22 [id number]   Member does not have an upcoming appointment with [facility] and she has not attended the required classes for benefit eligibility.  Appointment request letter sent. Peter Garter RN, [facility] Care Management [phone]

## 2014-10-04 ENCOUNTER — Ambulatory Visit: Payer: 59 | Admitting: Podiatry

## 2014-10-25 ENCOUNTER — Encounter: Payer: Self-pay | Admitting: Podiatry

## 2014-10-25 ENCOUNTER — Ambulatory Visit (INDEPENDENT_AMBULATORY_CARE_PROVIDER_SITE_OTHER): Payer: 59 | Admitting: Podiatry

## 2014-10-25 VITALS — BP 127/75 | HR 82 | Ht 64.0 in | Wt 178.0 lb

## 2014-10-25 DIAGNOSIS — M216X1 Other acquired deformities of right foot: Secondary | ICD-10-CM | POA: Diagnosis not present

## 2014-10-25 DIAGNOSIS — M21961 Unspecified acquired deformity of right lower leg: Secondary | ICD-10-CM | POA: Insufficient documentation

## 2014-10-25 DIAGNOSIS — M722 Plantar fascial fibromatosis: Secondary | ICD-10-CM | POA: Diagnosis not present

## 2014-10-25 DIAGNOSIS — M216X9 Other acquired deformities of unspecified foot: Secondary | ICD-10-CM | POA: Insufficient documentation

## 2014-10-25 DIAGNOSIS — G5750 Tarsal tunnel syndrome, unspecified lower limb: Secondary | ICD-10-CM | POA: Insufficient documentation

## 2014-10-25 DIAGNOSIS — G5751 Tarsal tunnel syndrome, right lower limb: Secondary | ICD-10-CM | POA: Diagnosis not present

## 2014-10-25 DIAGNOSIS — M79671 Pain in right foot: Secondary | ICD-10-CM | POA: Diagnosis not present

## 2014-10-25 NOTE — Patient Instructions (Signed)
Seen for pain in right heel. Noted of tight Achilles tendon. Need to do daily stretch exercise. All nails debrided. Cortisone injection given to right heel. Return for custom orthotics.

## 2014-10-25 NOTE — Progress Notes (Signed)
SUBJECTIVE: 59 y.o. year old female presents complaining of right heel pain x 2-3 months. On feet about 12 hours a day. Hurts the most in the morning and at night. First it happened in December and went away.  REVIEW OF SYSTEMS: Constitutional: negative Eyes: negative Ears, nose, mouth, throat, and face: negative Respiratory: negative Cardiovascular: negative Gastrointestinal: negative Genitourinary:negative Integument/breast: negative Musculoskeletal:Having left hip pain off and on.  Neurological: negative Allergic/Immunologic: negative  OBJECTIVE: DERMATOLOGIC EXAMINATION: Normal findings. VASCULAR EXAMINATION OF LOWER LIMBS: Pedal pulses: All pedal pulses are palpable with normal pulsation.  Capillary Filling times within 3 seconds in all digits.  No edema or erythema noted.  NEUROLOGIC EXAMINATION OF THE LOWER LIMBS: With light pressure and eversion of ankle joint, elicited pain along the Tarsal tunnel area that radiate to plantar medial heel right foot.  Monofilament (Semmes-Weinstein 10-gm) sensory testing positive 6 out of 6, bilateral. Vibratory sensations(128Hz  turning fork) intact at medial and lateral forefoot bilateral.  Sharp and Dull discriminatory sensations at the plantar ball of hallux is intact bilateral.  MUSCULOSKELETAL EXAMINATION: Positive for tight Achilles tendon on right. High arched cavus type foot bilateral. Pain at plantar heel and deltoid ligament with valgus rotation of rearfoot right.  RADIOGRAPHIC FINDINGS: Positive of posterior heel spur bilateral. Small spur formation on right plantar heel.  Lateral view shoe mild elevation of the first ray right.  Overall normal forefoot and rear foot relationship. No acute changes in osseous or articular surfaces. Normal metatarsal length, normal CYMA line, and no abnormal lateral deviation of the forefoot noted.   ASSESSMENT: 1. Plantar fasciitis right. 2. 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  3. Ankle Equinus right.  PLAN: Reviewed findings and available treatment options. Cortisone injection given to right plantar heel and Sinus tarsi area. Injected with mixture of 4 mg Dexamethasone, 4 mg Triamcinolone, and 1 cc of 0.5% Marcaine plain. Patient tolerated well without difficulty.  Night splint dispensed with instruction. Reviewed stretch exercise right Achilles tendon. Return for custom orthotics.

## 2014-10-31 ENCOUNTER — Encounter: Payer: 59 | Attending: Internal Medicine

## 2014-10-31 VITALS — Ht 64.0 in | Wt 183.1 lb

## 2014-10-31 DIAGNOSIS — Z713 Dietary counseling and surveillance: Secondary | ICD-10-CM | POA: Diagnosis not present

## 2014-10-31 DIAGNOSIS — Z794 Long term (current) use of insulin: Secondary | ICD-10-CM | POA: Insufficient documentation

## 2014-10-31 DIAGNOSIS — E119 Type 2 diabetes mellitus without complications: Secondary | ICD-10-CM | POA: Diagnosis not present

## 2014-11-02 NOTE — Progress Notes (Signed)

## 2014-11-07 DIAGNOSIS — E119 Type 2 diabetes mellitus without complications: Secondary | ICD-10-CM | POA: Diagnosis not present

## 2014-11-07 NOTE — Progress Notes (Signed)

## 2014-11-14 DIAGNOSIS — E119 Type 2 diabetes mellitus without complications: Secondary | ICD-10-CM

## 2014-11-20 NOTE — Progress Notes (Signed)
Patient was seen on 11/14/14 for the third of a series of three diabetes self-management courses at the [facility].   Rachel Gaines the amount of activity recommended for healthy living . Describe activities suitable for individual needs . Identify ways to regularly incorporate activity into daily life . Identify barriers to activity and ways to over come these barriers  Identify diabetes medications being personally used and their primary action for lowering glucose and possible side effects . Describe role of stress on blood glucose and develop strategies to address psychosocial issues . Identify diabetes complications and ways to prevent them  Explain how to manage diabetes during illness . Evaluate success in meeting personal goal . Establish 2-3 goals that they will plan to diligently work on until they return for the  28-month follow-up visit  Goals:   I will count my carb choices at most meals and snacks  I will try to do more activities  To help manage stress I will  relax at least 7 times a week  Your patient has identified these potential barriers to change:  [place] patient has identified their diabetes self-care support plan as  Family Forensic scientist Resources Plan:  Attend Core 4 in 4 months

## 2014-12-18 ENCOUNTER — Telehealth: Payer: Self-pay | Admitting: *Deleted

## 2014-12-18 NOTE — Telephone Encounter (Signed)
Pt states she had part of her toenail removed due to fungus and it is turning brown, and she would like an appt, because she is a diabetic.  Left message instructing pt to begin epsom salt soaks bid and antibiotic ointment to the toenail area after each soak until seen in the office.

## 2014-12-20 ENCOUNTER — Telehealth: Payer: Self-pay

## 2014-12-20 ENCOUNTER — Ambulatory Visit (INDEPENDENT_AMBULATORY_CARE_PROVIDER_SITE_OTHER): Payer: 59 | Admitting: Podiatry

## 2014-12-20 ENCOUNTER — Telehealth: Payer: Self-pay | Admitting: *Deleted

## 2014-12-20 ENCOUNTER — Encounter: Payer: Self-pay | Admitting: Podiatry

## 2014-12-20 VITALS — BP 108/58 | HR 81 | Resp 14

## 2014-12-20 DIAGNOSIS — L603 Nail dystrophy: Secondary | ICD-10-CM | POA: Diagnosis not present

## 2014-12-20 DIAGNOSIS — G5751 Tarsal tunnel syndrome, right lower limb: Secondary | ICD-10-CM | POA: Diagnosis not present

## 2014-12-20 DIAGNOSIS — S90222A Contusion of left lesser toe(s) with damage to nail, initial encounter: Secondary | ICD-10-CM | POA: Diagnosis not present

## 2014-12-20 DIAGNOSIS — M79674 Pain in right toe(s): Secondary | ICD-10-CM

## 2014-12-20 DIAGNOSIS — R202 Paresthesia of skin: Secondary | ICD-10-CM | POA: Diagnosis not present

## 2014-12-20 DIAGNOSIS — R200 Anesthesia of skin: Secondary | ICD-10-CM

## 2014-12-20 DIAGNOSIS — B351 Tinea unguium: Secondary | ICD-10-CM

## 2014-12-20 MED ORDER — CLINDAMYCIN HCL 300 MG PO CAPS: 300.0000 mg | ORAL_CAPSULE | Freq: Three times a day (TID) | ORAL | Status: DC

## 2014-12-20 NOTE — Telephone Encounter (Signed)
Left 1st toenail fragment sent to [facility] for definitive diagnosis of fungal element.

## 2014-12-20 NOTE — Telephone Encounter (Signed)
Entered in error

## 2014-12-20 NOTE — Patient Instructions (Signed)

## 2014-12-21 NOTE — Progress Notes (Signed)
Patient ID: Towana Stenglein, female   DOB: 06-16-1956, 59 y.o.   MRN: [id number]  Subjective: 59 year old female presents the office today for complaints of left big toe nail pain. She says been ongoing for a couple weeks. She states that she has pain to the toenail continue with pressure in shoe gear. She states the nails have started become more discolored over the last couple weeks and discloses a brown discoloration. She also states that she has numbness to her toe is been ongoing. She denies any drainage or any redness around the toenail. She also continues have pain to the right ankle. Since last point she did see another podiatrist who gave her an injection which she states it helped for approximately one week before the pain reoccurred. She was also given a night for which seems to help the pain somewhat although she does continue to have pain. She describes numbness and tingling to her right foot as well. Denies any history of injury or trauma. Denies any swelling or redness overlying bilateral lower semis. No other complaints at this time in no acute changes.  Objective: AAO 3, NAD DP/PT pulses palpable, CRT less than 3 seconds Protective sensation intact with Simms Weinstein monofilament although there subjective numbness continuing to bilateral feet. Left hallux toenails hypertrophic, dystrophic, brittle, discolored with some evidence of subungual hematoma and brown discoloration to the toenail. The nail does appear to be loose and underlying nailbed distally and only a few along the proximal aspect. Upon debridement of the nail there was thick serosanguineous drainage identified from underneath the toenail. Upon further debridement and nail root did appear to be an ulceration within the nailbed. Because of this I did recommended total nail avulsion see procedure note below. There was mild edema around the toenail. There is no associated erythema, ascending cellulitis, malodor. On the right foot  there is mild to palpation overlying the medial aspect of the medial band of the plantar fascial insertion of the plantar fascia as well as along the insertion of the Achilles tendon. There is no pain with lateral compression of the calcaneus or pain with vibratory sensation. There is a positive Tinel sign present on the right side. There is no other areas of tenderness to bilateral lower extremity's. No overlying edema, erythema, increase in warmth. No other open lesions or pre-ulcerative lesions identified elsewhere. No pain with calf compression, swelling, warmth, erythema.  59 year old female left hallux onychodystrophy/onycholysis with underlying ulceration of the nail bed; possible tarsal tunnel right side versus neuropathy with tendinitis.   Plan: -Treatment options discussed including all alternatives, risks, and complications  1.Left hallux toenail pathology -Upon debridement the nail but didn't appear to be thick drainage identified toenail and there did appear to be a wound within the nail bed underlying the toenail. Because of this it discussed total nail avulsion. -At this time, recommended total nail removal without chemical matricectomy to the left hallux nail. Risks and complications were discussed with the patient for which they understand and  verbally consent to the procedure. Under sterile conditions a total of 3 mL of a mixture of 2% lidocaine plain and 0.5% Marcaine plain was infiltrated in a hallux block fashion. Once anesthetized, the skin was prepped in sterile fashion. A tourniquet was then applied. Next the entire left toenail  of the hallux was excised making sure to remove the entire offending nail borders. Once the nail was removed, the area was debrided. The underlying skin the nail bed was intact. The area  was irrigated and hemostasis was obtained.  A dry sterile dressing was applied. After application of the dressing the tourniquet was removed and there is found  to be an immediate capillary refill time to the digit. The patient tolerated the procedure well any complications. Post procedure instructions were discussed the patient for which he verbally understood. Follow-up in one week for nail check or sooner if any problems are to arise. Discussed signs/symptoms of worsening infection and directed to call the office immediately should any occur or go directly to the emergency room. In the meantime, encouraged to call the office with any questions, concerns, changes symptoms.  2. Right ankle/foot pain -There is a positive tinel sign and there is concern for tarsal tunnel. Also, given diabetes, possible neuropathy leading to the bilateral numbness/tinglings. Will order NCV testing to rule out both of these pathologies. -Discussed further steroid injection however will this time due to the procedure and the left side , patient's request. -Continue stretching, icing activities, night splint. -Continue shoe gear modifications and orthotics.  -Follow-up 1 weeks or sooner if any problems arise. In the meantime, encouraged to call the office with any questions, concerns, change in symptoms.   Celesta Gentile, DPM

## 2014-12-23 ENCOUNTER — Telehealth: Payer: Self-pay | Admitting: *Deleted

## 2014-12-23 DIAGNOSIS — G629 Polyneuropathy, unspecified: Secondary | ICD-10-CM

## 2014-12-23 NOTE — Telephone Encounter (Addendum)
Dr. Jacqualyn Posey request nerve conduction testing r/o neuropathy/tarsal tunnel, B/L.  Informed pt of referral and [facility] [phone].

## 2014-12-24 NOTE — Telephone Encounter (Signed)
Entered in error

## 2014-12-27 ENCOUNTER — Encounter: Payer: Self-pay | Admitting: Podiatry

## 2014-12-27 ENCOUNTER — Ambulatory Visit (INDEPENDENT_AMBULATORY_CARE_PROVIDER_SITE_OTHER): Payer: 59 | Admitting: Podiatry

## 2014-12-27 VITALS — BP 108/59 | HR 76 | Resp 18

## 2014-12-27 DIAGNOSIS — Z9889 Other specified postprocedural states: Secondary | ICD-10-CM

## 2015-01-06 NOTE — Progress Notes (Signed)
Patient ID: Rachel Gaines, female   DOB: [birth year], 59 y.o.   MRN: [id number]  Subjective: 59 year old female presents the office today one week status post left hallux total nail avulsion. She states that she is doing well however she has no some clear drainage the area but denies any. She denies any pain to the area. Denies any surrounding redness or any red streaks. She's been soaking her foot twice a day Epson salt soaks followed by antibiotic ointment and a Band-Aid. Denies any systemic complaints as fevers, chills, nausea, vomiting. No other complaints at this time. She scheduled however nerve conduction test in August.  Objective: AAO 3, NAD Neurovascular status unchanged Status post left hallux total nail avulsion which is healing well. There is a small amount of hyperkeratotic tissue as well as granular tissue at the the procedure site from the nailbed however there is no tenderness to palpation overlying the area. There is no drainage or purulence expressed. No swelling erythema, edema, ascending cellulitis, flexors, crepitus, malodor. There is no signs of infection at this time. No other open lesions or pre-ulcer lesions identified bilaterally. No pain with calf compression, swelling, warmth, erythema bilaterally.  Assessment: 59 year old female 1 week status post left hallux total nail avulsion, healing well  Plan: -Treatment options discussed including all alternatives, risks, and complications -Recommended taking ascending Epson salt soaks twice a day covering with antibody ointment and a Band-Aid in the day. Can leave the area uncovered at night. Finish antibiotics. Monitor for any clinical signs or symptoms of infection and directed to call the office immediately should any occur or go to the ER. Follow-up in 2 weeks of the area is not completely healed or sooner if any palms are to arise. -Follow-up after NCV or sooner if any problems arise. In the meantime, encouraged to call the  office with any questions, concerns, change in symptoms.   Celesta Gentile, DPM

## 2015-01-17 ENCOUNTER — Encounter (INDEPENDENT_AMBULATORY_CARE_PROVIDER_SITE_OTHER): Payer: 59 | Admitting: Ophthalmology

## 2015-01-17 DIAGNOSIS — E11329 Type 2 diabetes mellitus with mild nonproliferative diabetic retinopathy without macular edema: Secondary | ICD-10-CM | POA: Diagnosis not present

## 2015-01-17 DIAGNOSIS — H35033 Hypertensive retinopathy, bilateral: Secondary | ICD-10-CM

## 2015-01-17 DIAGNOSIS — I10 Essential (primary) hypertension: Secondary | ICD-10-CM | POA: Diagnosis not present

## 2015-01-17 DIAGNOSIS — H43813 Vitreous degeneration, bilateral: Secondary | ICD-10-CM

## 2015-01-17 DIAGNOSIS — E11311 Type 2 diabetes mellitus with unspecified diabetic retinopathy with macular edema: Secondary | ICD-10-CM

## 2015-01-17 DIAGNOSIS — E11321 Type 2 diabetes mellitus with mild nonproliferative diabetic retinopathy with macular edema: Secondary | ICD-10-CM | POA: Diagnosis not present

## 2015-01-20 ENCOUNTER — Telehealth: Payer: Self-pay | Admitting: *Deleted

## 2015-01-20 MED ORDER — NUVAIL EX SOLN: 1.0000 [drp] | Freq: Every day | CUTANEOUS | Status: DC

## 2015-01-20 MED ORDER — NUVAIL EX SOLN
1.0000 [drp] | Freq: Every day | CUTANEOUS | Status: DC
Start: 2015-01-20 — End: 2018-12-08

## 2015-01-20 NOTE — Telephone Encounter (Addendum)
Dr. Jacqualyn Posey reviewed pt's results as negative for fungus and ordered [place].  Informed pt of results and treatment, pt request the [place].  Done.

## 2015-01-20 NOTE — Telephone Encounter (Signed)
Informing pt [facility] drug representative brought in new program that the [facility] went throught the [place] and was generally covered at $35.00 per bottle.  Information pertaining to the [place] was mailed to the pt.

## 2015-01-21 NOTE — Telephone Encounter (Signed)
Entered in error

## 2015-01-24 ENCOUNTER — Encounter (INDEPENDENT_AMBULATORY_CARE_PROVIDER_SITE_OTHER): Payer: 59 | Admitting: Ophthalmology

## 2015-01-24 DIAGNOSIS — E11321 Type 2 diabetes mellitus with mild nonproliferative diabetic retinopathy with macular edema: Secondary | ICD-10-CM | POA: Diagnosis not present

## 2015-01-24 DIAGNOSIS — E11311 Type 2 diabetes mellitus with unspecified diabetic retinopathy with macular edema: Secondary | ICD-10-CM

## 2015-02-07 ENCOUNTER — Ambulatory Visit: Payer: 59

## 2015-02-07 ENCOUNTER — Ambulatory Visit: Payer: 59 | Admitting: Podiatry

## 2015-02-14 ENCOUNTER — Ambulatory Visit (INDEPENDENT_AMBULATORY_CARE_PROVIDER_SITE_OTHER): Payer: 59 | Admitting: Podiatry

## 2015-02-14 ENCOUNTER — Encounter: Payer: Self-pay | Admitting: Podiatry

## 2015-02-14 VITALS — BP 105/69 | HR 72 | Resp 12

## 2015-02-14 DIAGNOSIS — L603 Nail dystrophy: Secondary | ICD-10-CM | POA: Diagnosis not present

## 2015-02-14 DIAGNOSIS — M722 Plantar fascial fibromatosis: Secondary | ICD-10-CM | POA: Diagnosis not present

## 2015-02-14 MED ORDER — MELOXICAM 7.5 MG PO TABS: 7.5000 mg | ORAL_TABLET | Freq: Every day | ORAL | Status: DC

## 2015-02-14 NOTE — Patient Instructions (Signed)
Plantar Fasciitis (Heel Spur Syndrome) with Rehab The plantar fascia is a fibrous, ligament-like, soft-tissue structure that spans the bottom of the foot. Plantar fasciitis is a condition that causes pain in the foot due to inflammation of the tissue. SYMPTOMS   Pain and tenderness on the underneath side of the foot.  Pain that worsens with standing or walking. CAUSES  Plantar fasciitis is caused by irritation and injury to the plantar fascia on the underneath side of the foot. Common mechanisms of injury include:  Direct trauma to bottom of the foot.  Damage to a small nerve that runs under the foot where the main fascia attaches to the heel bone.  Stress placed on the plantar fascia due to bone spurs. RISK INCREASES WITH:   Activities that place stress on the plantar fascia (running, jumping, pivoting, or cutting).  Poor strength and flexibility.  Improperly fitted shoes.  Tight calf muscles.  Flat feet.  Failure to warm-up properly before activity.  Obesity. PREVENTION  Warm up and stretch properly before activity.  Allow for adequate recovery between workouts.  Maintain physical fitness:  Strength, flexibility, and endurance.  Cardiovascular fitness.  Maintain a health body weight.  Avoid stress on the plantar fascia.  Wear properly fitted shoes, including arch supports for individuals who have flat feet. PROGNOSIS  If treated properly, then the symptoms of plantar fasciitis usually resolve without surgery. However, occasionally surgery is necessary. RELATED COMPLICATIONS   Recurrent symptoms that may result in a chronic condition.  Problems of the lower back that are caused by compensating for the injury, such as limping.  Pain or weakness of the foot during push-off following surgery.  Chronic inflammation, scarring, and partial or complete fascia tear, occurring more often from repeated injections. TREATMENT  Treatment initially involves the use of  ice and medication to help reduce pain and inflammation. The use of strengthening and stretching exercises may help reduce pain with activity, especially stretches of the Achilles tendon. These exercises may be performed at home or with a therapist. Your caregiver may recommend that you use heel cups of arch supports to help reduce stress on the plantar fascia. Occasionally, corticosteroid injections are given to reduce inflammation. If symptoms persist for greater than 6 months despite non-surgical (conservative), then surgery may be recommended.  MEDICATION   If pain medication is necessary, then nonsteroidal anti-inflammatory medications, such as aspirin and ibuprofen, or other minor pain relievers, such as acetaminophen, are often recommended.  Do not take pain medication within 7 days before surgery.  Prescription pain relievers may be given if deemed necessary by your caregiver. Use only as directed and only as much as you need.  Corticosteroid injections may be given by your caregiver. These injections should be reserved for the most serious cases, because they may only be given a certain number of times. HEAT AND COLD  Cold treatment (icing) relieves pain and reduces inflammation. Cold treatment should be applied for 10 to 15 minutes every 2 to 3 hours for inflammation and pain and immediately after any activity that aggravates your symptoms. Use ice packs or massage the area with a piece of ice (ice massage).  Heat treatment may be used prior to performing the stretching and strengthening activities prescribed by your caregiver, physical therapist, or athletic trainer. Use a heat pack or soak the injury in warm water. SEEK IMMEDIATE MEDICAL CARE IF:  Treatment seems to offer no benefit, or the condition worsens.  Any medications produce adverse side effects. EXERCISES RANGE   OF MOTION (ROM) AND STRETCHING EXERCISES - Plantar Fasciitis (Heel Spur Syndrome) These exercises may help you  when beginning to rehabilitate your injury. Your symptoms may resolve with or without further involvement from your physician, physical therapist or athletic trainer. While completing these exercises, remember:   Restoring tissue flexibility helps normal motion to return to the joints. This allows healthier, less painful movement and activity.  An effective stretch should be held for at least 30 seconds.  A stretch should never be painful. You should only feel a gentle lengthening or release in the stretched tissue. RANGE OF MOTION - Toe Extension, Flexion  Sit with your right / left leg crossed over your opposite knee.  Grasp your toes and gently pull them back toward the top of your foot. You should feel a stretch on the bottom of your toes and/or foot.  Hold this stretch for __________ seconds.  Now, gently pull your toes toward the bottom of your foot. You should feel a stretch on the top of your toes and or foot.  Hold this stretch for __________ seconds. Repeat __________ times. Complete this stretch __________ times per day.  RANGE OF MOTION - Ankle Dorsiflexion, Active Assisted  Remove shoes and sit on a chair that is preferably not on a carpeted surface.  Place right / left foot under knee. Extend your opposite leg for support.  Keeping your heel down, slide your right / left foot back toward the chair until you feel a stretch at your ankle or calf. If you do not feel a stretch, slide your bottom forward to the edge of the chair, while still keeping your heel down.  Hold this stretch for __________ seconds. Repeat __________ times. Complete this stretch __________ times per day.  STRETCH - Gastroc, Standing  Place hands on wall.  Extend right / left leg, keeping the front knee somewhat bent.  Slightly point your toes inward on your back foot.  Keeping your right / left heel on the floor and your knee straight, shift your weight toward the wall, not allowing your back to  arch.  You should feel a gentle stretch in the right / left calf. Hold this position for __________ seconds. Repeat __________ times. Complete this stretch __________ times per day. STRETCH - Soleus, Standing  Place hands on wall.  Extend right / left leg, keeping the other knee somewhat bent.  Slightly point your toes inward on your back foot.  Keep your right / left heel on the floor, bend your back knee, and slightly shift your weight over the back leg so that you feel a gentle stretch deep in your back calf.  Hold this position for __________ seconds. Repeat __________ times. Complete this stretch __________ times per day. STRETCH - Gastrocsoleus, Standing  Note: This exercise can place a lot of stress on your foot and ankle. Please complete this exercise only if specifically instructed by your caregiver.   Place the ball of your right / left foot on a step, keeping your other foot firmly on the same step.  Hold on to the wall or a rail for balance.  Slowly lift your other foot, allowing your body weight to press your heel down over the edge of the step.  You should feel a stretch in your right / left calf.  Hold this position for __________ seconds.  Repeat this exercise with a slight bend in your right / left knee. Repeat __________ times. Complete this stretch __________ times per day.    STRENGTHENING EXERCISES - Plantar Fasciitis (Heel Spur Syndrome)  These exercises may help you when beginning to rehabilitate your injury. They may resolve your symptoms with or without further involvement from your physician, physical therapist or athletic trainer. While completing these exercises, remember:   Muscles can gain both the endurance and the strength needed for everyday activities through controlled exercises.  Complete these exercises as instructed by your physician, physical therapist or athletic trainer. Progress the resistance and repetitions only as guided. STRENGTH -  Towel Curls  Sit in a chair positioned on a non-carpeted surface.  Place your foot on a towel, keeping your heel on the floor.  Pull the towel toward your heel by only curling your toes. Keep your heel on the floor.  If instructed by your physician, physical therapist or athletic trainer, add ____________________ at the end of the towel. Repeat __________ times. Complete this exercise __________ times per day. STRENGTH - Ankle Inversion  Secure one end of a rubber exercise band/tubing to a fixed object (table, pole). Loop the other end around your foot just before your toes.  Place your fists between your knees. This will focus your strengthening at your ankle.  Slowly, pull your big toe up and in, making sure the band/tubing is positioned to resist the entire motion.  Hold this position for __________ seconds.  Have your muscles resist the band/tubing as it slowly pulls your foot back to the starting position. Repeat __________ times. Complete this exercises __________ times per day.  Document Released: 06/14/2005 Document Revised: 09/06/2011 Document Reviewed: 09/26/2008 [employer] Patient Information 2015 [employer]. This information is not intended to replace advice given to you by your health care provider. Make sure you discuss any questions you have with your health care provider.  

## 2015-02-17 NOTE — Progress Notes (Signed)
Patient ID: Rachel Gaines, female   DOB: [birth year], 59 y.o.   MRN: [id number]  Subjective:  59 year old female presents a follow up evaluation of right heel pain. She states that since last time she did have the nerve conduction study performed. She continues have pain to her right heel in the mornings or after periods of activity or after standing for quite some time. She states that she started to have more consistent pain. She describes a throbbing pain has been about the same. She states that she presents in injection with another podiatrist but she is unsure if it helped. She is tried some intermittent stretching exercises well without much relief. She denies any history of injury or trauma. Denies any swelling or redness. The pain does not wake her up at night. She also states her left hallux toenail is doing well and there is no pain. She states that her right big toenail is starting to lift. Denies any drainage or redness. No other complaints at this time.  Objective: AAO x3, NAD DP/PT pulses palpable bilaterally, CRT less than 3 seconds Protective sensation intact with Simms Weinstein monofilament; negative tinel sign at today's appointment. Tenderness to palpation overlying the plantar medial tubercle of the calcaneus to the right heel at the insertion of the plantar fascia. There is no pain along the course of plantar fascia within the arch of the foot. There is no pain with lateral compression of the calcaneus or pain the vibratory sensation.  No pain on the posterior aspect of the calcaneus or along the course/insertion of the Achilles tendon. There is no overlying edema, erythema, increase in warmth.  MMT 5/5, ROM WNL Left hallux toenail site is healed. There is a small hyperkeratotic lesion on the nailbed. There is no tenderness or drainage. No surrounding erythema or drainage. Remaining nails are also hypertrophic, dystrophic, discolored, brittle. Particular the right hallux nail is  loose from the underlying nail bed. There is no drainage or purulence expressed. There is no surrounding erythema or drainage. No other areas of tenderness palpation or pain with vibratory sensation to the foot/ankle.  No open lesions or pre-ulcerative lesions are identified. No pain with calf compression, swelling, warmth, erythema.  Assessment : 59 year old female with right heel pain, likely plantar fasciitis; onychodystrophy   Plan: -Treatment options discussed including all alternatives, risks, and complications  1. Right heel pain - And she study was reviewed. It was negative for tarsal tunnel.  -Discussed likely etiology of her symptoms. For now we will treat for her fasciitis has her symptoms appear to be consistent with this.  Patient elects to proceed with steroid injection into the right heel. Under sterile skin preparation, a total of 2.5cc of kenalog 10, 0.5% Marcaine plain, and 2% lidocaine plain were infiltrated into the symptomatic area without complication. A band-aid was applied. Patient tolerated the injection well without complication. Post-injection care with discussed with the patient. Discussed with the patient to ice the area over the next couple of days to help prevent a steroid flare.  -Plantar fascial brace -Ice to the area daily. -Stretching exercises daily -Discussed shoe gear modifications and orthotics. -Prescribed mobic. Discussed side effects of the medication and directed to stop if any are to occur and call the office.   2. Onychodystrophy -Apparently culture result was negative for fungus. Continue NuVail -Nails debrided 10 without complication/bleeding. Right hallux nails loose. His debrided of all loose nail. She can perform Epson salt soaks the right foot. At this time there is  no signs of infection.  -Follow-up 3 weeks or sooner if any problems arise. In the meantime, encouraged to call the office with any questions, concerns, change in symptoms.

## 2015-02-19 ENCOUNTER — Ambulatory Visit: Payer: 59

## 2015-02-27 ENCOUNTER — Other Ambulatory Visit: Payer: Self-pay

## 2015-02-27 VITALS — BP 120/68 | HR 76 | Resp 14 | Ht 63.5 in | Wt 180.4 lb

## 2015-02-27 DIAGNOSIS — E119 Type 2 diabetes mellitus without complications: Secondary | ICD-10-CM

## 2015-02-27 NOTE — Patient Outreach (Signed)
[facility]) Care Management   02/27/2015  Rachel Gaines [birth year] [id number]  Rachel Gaines is an 59 y.o. female.   Member seen for follow up office visit for Link to Wellness program for self management of Type 2 diabetes  Subjective: Member states she has been having pain in her rt foot and she has been getting cortisone shots which have been making her blood sugars go up.  States that she has not been able to walk due to her foot pain but she is thinking about joining a gym that has water aerobics.  States she is still working 2 jobs and she is worried about her daughter in [place] who has lupus.    Objective:   Review of Systems  Musculoskeletal: Positive for joint pain.    Physical Exam  Today's Vitals   02/27/15 0847 02/27/15 0857  BP: 120/68   Pulse: 76   Resp: 14   Height: 1.613 m (5' 3.5")   Weight: 180 lb 6.4 oz (81.829 kg)   SpO2: 96%   PainSc: 8  8     Current Medications:   Current Outpatient Prescriptions  Medication Sig Dispense Refill  . aspirin EC 81 MG tablet Take 81 mg by mouth daily.    . canagliflozin (INVOKANA) 100 MG TABS tablet Take 100 mg by mouth.    Marland Kitchen CINNAMON PO Take by mouth.    . Dermatological Products, Misc. (NUVAIL) SOLN Apply 1 drop topically daily. 1 Bottle 11  . gabapentin (NEURONTIN) 300 MG capsule Take 300 mg by mouth 3 (three) times daily. Only takes at night    . glimepiride (AMARYL) 4 MG tablet Take 4 mg by mouth daily with breakfast.    . insulin glargine (LANTUS) 100 UNIT/ML injection Inject 30 Units into the skin 2 (two) times daily.    . Multiple Vitamin (MULTIVITAMIN) tablet Take 1 tablet by mouth daily.    . ramipril (ALTACE) 2.5 MG capsule Take 2.5 mg by mouth daily.    . simvastatin (ZOCOR) 40 MG tablet Take 40 mg by mouth daily.    . sitaGLIPtin (JANUVIA) 100 MG tablet Take 100 mg by mouth daily.    . valACYclovir (VALTREX) 500 MG tablet Take 1 tablet (500 mg total) by mouth 2 (two) times daily. For 5 days  30 tablet 11  . clindamycin (CLEOCIN) 300 MG capsule Take 1 capsule (300 mg total) by mouth 3 (three) times daily. (Patient not taking: Reported on 02/27/2015) 30 capsule 2  . diclofenac sodium (VOLTAREN) 1 % GEL Apply 2 g topically 4 (four) times daily. Rub into affected area of foot 2 to 4 times daily (Patient not taking: Reported on 02/27/2015) 100 g 2  . meloxicam (MOBIC) 7.5 MG tablet Take 1 tablet (7.5 mg total) by mouth daily. (Patient not taking: Reported on 02/27/2015) 30 tablet 0  . Tavaborole (KERYDIN) 5 % SOLN Apply 1 drop topically 1 day or 1 dose. Apply 1 drop to the toenail daily. (Patient not taking: Reported on 02/27/2015) 10 mL 2   No current facility-administered medications for this visit.    Functional Status:   In your present state of health, do you have any difficulty performing the following activities: 02/27/2015  Hearing? N  Vision? N  Difficulty concentrating or making decisions? N  Walking or climbing stairs? N  Dressing or bathing? N  Doing errands, shopping? N    Fall/Depression Screening:    PHQ 2/9 Scores 02/27/2015 11/02/2014  PHQ - 2 Score  1 0   THN CM Care Plan Problem One        Most Recent Value   Care Plan Problem One  Elevated blood sugars related to dx of Type 2 DM   Role Documenting the Problem One  Care Management Coordinator   Care Plan for Problem One  Active   THN Long Term Goal (31-90 days)  Member will lower hemoglobin A1C by one point in the next 90 days   THN Long Term Goal Start Date  02/27/15   Interventions for Problem One Long Term Goal  Reviewed CHO counting and portion control, Discussed making wise choices when getting fast food, Reinforced importance of  eating when she takes her glimepiride and to take her insulin daily, Issued True Metrix glucometer and  demonstrated on use, Instructed to contact MD to get order for strips sent to pharmacy, Reinforced importance of regular exercise for glycemic control      Assessment:  Member seen for  follow up office visit for Link to Wellness program for self management of Type 2 diabetes.  Member has not been well controlled with last hemoglobin A1C of 8.6.  Member has not been taking her morning Lantus insulin if her blood sugar is below 100.  Member has not been exercising due to foot pain  Plan:   Plan to eat 30-45 GM (2-3) servings of carbohydrate a meal and 15 GM for snacks.  Do not skip meals  Plan to check blood sugar twice a day either fasting or 1 -2hrs after a meal.  Goals are 80-130 fasting and after meals less than 180. Plan to go to water aerobics twice a week    Goals to get up to 150 minutes. Plan to complete EMMI programs by 05/29/15 Plan to see Dr. Virgina Jock on October 7 Plan to return to Link to Wellness on 05/09/15  Peter Garter RN, [facility] Care Management Coordinator-Link to [place] Management [phone]

## 2015-02-27 NOTE — Patient Instructions (Signed)
1. Plan to eat 30-45 GM (2-3) servings of carbohydrate a meal and 15 GM for snacks.  Do not skip meals  2. Plan to check blood sugar twice a day either fasting or 1 -2hrs after a meal.  Goals are 80-130 fasting and after meals less than 180. 3. Plan to go to water aerobics twice a week    Goals to get up to 150 minutes. 4. Plan to complete EMMI programs by 05/29/15 5. Plan to see Dr. Virgina Jock on October 7 6. Plan to return to Link to Wellness on 05/09/15 at [facility]

## 2015-03-14 ENCOUNTER — Ambulatory Visit (INDEPENDENT_AMBULATORY_CARE_PROVIDER_SITE_OTHER): Payer: 59 | Admitting: Podiatry

## 2015-03-14 ENCOUNTER — Encounter: Payer: Self-pay | Admitting: Podiatry

## 2015-03-14 VITALS — BP 109/56 | HR 73 | Resp 18

## 2015-03-14 DIAGNOSIS — M722 Plantar fascial fibromatosis: Secondary | ICD-10-CM | POA: Diagnosis not present

## 2015-03-14 DIAGNOSIS — L603 Nail dystrophy: Secondary | ICD-10-CM

## 2015-03-14 DIAGNOSIS — L84 Corns and callosities: Secondary | ICD-10-CM | POA: Diagnosis not present

## 2015-03-14 MED ORDER — MELOXICAM 7.5 MG PO TABS: 7.5000 mg | ORAL_TABLET | Freq: Every day | ORAL | Status: DC

## 2015-03-18 ENCOUNTER — Encounter: Payer: Self-pay | Admitting: Podiatry

## 2015-03-19 NOTE — Progress Notes (Signed)
Patient ID: Rachel Gaines, female   DOB: Aug 06, 1955, 59 y.o.   MRN: [id number]  Subjective: 59 year old phenol for since the office they for follow-up evaluation of right heel pain. She states that the pain is significantly improved compared to last appointment although she does continue to have pain to the heel. She's been stretching and icing. She denies any complications after the injection. She's been wearing the plantar fascial brace. Denies any swelling or redness. No tingling or numbness. No recent injury or trauma. She says her right big toenail is thick causing some discomfort. She's had some pain in the left nail bed on the side of the prior nail removal. Denies any surrounding redness or drainage. No other complaints at this time.  Objective: AAO x3, NAD DP/PT pulses palpable bilaterally, CRT less than 3 seconds Protective sensation intact with Simms Weinstein monofilament, vibratory sensation intact, Achilles tendon reflex intact There is mild but continued tenderness to palpation overlying the plantar medial tubercle of the calcaneus to the right heel at the insertion of the plantar fascia. There is no pain along the course of plantar fascia within the arch of the foot and the plantar fascia appears intact. There is no pain with lateral compression of the calcaneus or pain the vibratory sensation. No pain on the posterior aspect of the calcaneus or along the course/insertion of the Achilles tendon. Right hallux nails hypertrophic, dystrophic, brittle, discolored, elongated. Along the left hallux nail bed site there is a hyperkeratotic lesion overlying the area. There is no surrounding erythema or drainage on these sites. No pain to the remaining toenails. No surrounding redness or drainage. No other areas of tenderness to bilateral lower extremity's. There is no overlying edema, erythema, increase in warmth. No other areas of tenderness palpation or pain with vibratory sensation to the  foot/ankle. MMT 5/5, ROM WNL No open lesions or pre-ulcerative lesions are identified. No pain with calf compression, swelling, warmth, erythema.  Assessment: 59 year old female with resolving right heel pain, plantar fasciitis; right hallux onychodystrophy and left hallux hyperkeratotic tissue overlying the procedure site  Plan: -Treatment options discussed including all alternatives, risks, and complications -I discussed steroid injection to the right heel however she wishes to hold off on that at this time. Continue with shoe gear modifications and I discuss orthotics. Continue icing and stretching daily. Prescribed Voltaren gel she can limit the amount of anti-inflammatories she is taking by mouth. -Right hallux nail sharply debrided without complication/bleeding. Left hallux nail bedside with debridement of hyperkeratotic tissue without bleeding. -I recommended follow up in the next 3-4 weeks the right heel pain however she wishes to hold off. She'll follow-up with her regular 3 month routine care appointment or sooner if any problems are to arise. In the meantime I encouraged her to call the office with any questions, concerns, change in symptoms.  Celesta Gentile, DPM

## 2015-04-07 ENCOUNTER — Encounter: Payer: Self-pay | Admitting: Internal Medicine

## 2015-04-11 ENCOUNTER — Ambulatory Visit (INDEPENDENT_AMBULATORY_CARE_PROVIDER_SITE_OTHER): Payer: 59 | Admitting: Nurse Practitioner

## 2015-04-11 ENCOUNTER — Encounter: Payer: Self-pay | Admitting: Nurse Practitioner

## 2015-04-11 VITALS — BP 120/72 | HR 68 | Ht 64.25 in | Wt 178.0 lb

## 2015-04-11 DIAGNOSIS — B009 Herpesviral infection, unspecified: Secondary | ICD-10-CM | POA: Diagnosis not present

## 2015-04-11 DIAGNOSIS — Z01419 Encounter for gynecological examination (general) (routine) without abnormal findings: Secondary | ICD-10-CM | POA: Diagnosis not present

## 2015-04-11 DIAGNOSIS — Z0000 Encounter for general adult medical examination without abnormal findings: Secondary | ICD-10-CM | POA: Diagnosis not present

## 2015-04-11 MED ORDER — VALACYCLOVIR HCL 500 MG PO TABS: 500.0000 mg | ORAL_TABLET | Freq: Two times a day (BID) | ORAL | Status: DC

## 2015-04-11 NOTE — Patient Instructions (Addendum)

## 2015-04-11 NOTE — Progress Notes (Signed)
Patient ID: Rachel Gaines, female   DOB: February 13, 1956, 59 y.o.   MRN: [id number] 58 y.o. [id number] Single  African American Fe here for NGYN annual exam. She is a former pt here.  Rare vaso symptoms.  Not dating or SA.  No symptoms of vaginal dryness.  From time will have' itching' feelings that she thinks may be yeast from her diabetes.  Last Hgb AIC last Friday was 7.1.  Recent steroid injections in her foot for plantar fascitis.  Patient's last menstrual period was 06/28/2006 (approximate).          Sexually active: No.  The current method of family planning is post menopausal status.    Exercising: No.  The patient does not participate in regular exercise at present. Smoker:  no  Health Maintenance: Pap:  2014, Dr. Jodi Mourning ASCUS with negative HR HPV MMG:  11/2013, [facility], "Normal after additional imaging" - copy says pt was to have a follow up in 6 months and pt states she had done - no copy.  She will schedule update. Colonoscopy:  Maryanna Shape, has apt 04/2015 for recall BMD:   "long time ago" TDaP:  UTD Labs: Dr. Virgina Jock, 06/2014, has apt in 07/2015   reports that she has never smoked. She has never used smokeless tobacco. She reports that she does not drink alcohol or use illicit drugs.  Past Medical History  Diagnosis Date  . Diabetes mellitus without complication ([place])   . Plantar fasciitis   . Anemia     No past surgical history on file.  Current Outpatient Prescriptions  Medication Sig Dispense Refill  . aspirin EC 81 MG tablet Take 81 mg by mouth daily.    . canagliflozin (INVOKANA) 100 MG TABS tablet Take 100 mg by mouth.    Marland Kitchen CINNAMON PO Take by mouth.    . Dermatological Products, Misc. (NUVAIL) SOLN Apply 1 drop topically daily. 1 Bottle 11  . gabapentin (NEURONTIN) 300 MG capsule Take 300 mg by mouth 3 (three) times daily. Only takes at night    . glimepiride (AMARYL) 4 MG tablet Take 4 mg by mouth daily with breakfast.    . meloxicam (MOBIC) 7.5 MG tablet Take 1 tablet (7.5 mg  total) by mouth daily. 30 tablet 0  . Multiple Vitamin (MULTIVITAMIN) tablet Take 1 tablet by mouth daily.    . ramipril (ALTACE) 2.5 MG capsule Take 2.5 mg by mouth daily.    . simvastatin (ZOCOR) 40 MG tablet Take 40 mg by mouth daily.    . sitaGLIPtin (JANUVIA) 100 MG tablet Take 100 mg by mouth daily.    . Tavaborole (KERYDIN) 5 % SOLN Apply 1 drop topically 1 day or 1 dose. Apply 1 drop to the toenail daily. 10 mL 2  . valACYclovir (VALTREX) 500 MG tablet Take 1 tablet (500 mg total) by mouth 2 (two) times daily. For 5 days 30 tablet 11  . LANTUS SOLOSTAR 100 UNIT/ML Solostar Pen Inject 30 Units into the skin 2 (two) times daily. If CBG <80 in AM, take only 20 units.  1   No current facility-administered medications for this visit.    Family History  Problem Relation Age of Onset  . Diabetes Mother   . Diabetes Father   . Heart disease Father   . Cancer Father     Prostate  . Heart failure Father   . Heart disease Sister   . Diabetes Sister   . Diabetes Brother   . Lupus Daughter   .  Cancer Brother     lung cancer  . Diabetes Brother   . Diabetes Sister   . Heart disease Sister   . Diabetes Sister   . Diabetes Sister   . Diabetes Sister   . Diabetes Sister   . Diabetes Sister   . Diabetes Sister     ROS:  Pertinent items are noted in HPI.  Otherwise, a comprehensive ROS was negative.  Exam:   BP 120/72 mmHg  Pulse 68  Ht 5' 4.25" (1.632 m)  Wt 178 lb (80.74 kg)  BMI 30.31 kg/m2  LMP 06/28/2006 (Approximate) Height: 5' 4.25" (163.2 cm) (hair bun) Ht Readings from Last 3 Encounters:  04/11/15 5' 4.25" (1.632 m)  02/27/15 5' 3.5" (1.613 m)  11/02/14 5\' 4"  (1.626 m)    General appearance: alert, cooperative and appears stated age Head: Normocephalic, without obvious abnormality, atraumatic Neck: no adenopathy, supple, symmetrical, trachea midline and thyroid normal to inspection and palpation Lungs: clear to auscultation bilaterally Breasts: normal appearance,  no masses or tenderness Heart: regular rate and rhythm Abdomen: soft, non-tender; no masses,  no organomegaly Extremities: extremities normal, atraumatic, no cyanosis or edema Skin: Skin color, texture, turgor normal. No rashes or lesions Lymph nodes: Cervical, supraclavicular, and axillary nodes normal. No abnormal inguinal nodes palpated Neurologic: Grossly normal   Pelvic: External genitalia:  no lesions              Urethra:  normal appearing urethra with no masses, tenderness or lesions              Bartholin's and Skene's: normal                 Vagina: atrophic appearing vagina with normal color and discharge, no lesions              Cervix: anteverted              Pap taken: Yes.   Bimanual Exam:  Uterus:  normal size, contour, position, consistency, mobility, non-tender              Adnexa: no mass, fullness, tenderness               Rectovaginal: Confirms               Anus:  normal sphincter tone, no lesions  Chaperone present: yes  A:  Well Woman with normal exam  Postmenopausal no HRT  S/P colpo biopsy 02/04/1999 with cervicitis    History of ASCUS 2014 with negative HR HPV  History of DM, HTN, hypercholesterolemia  History of HSV with rare outbreak    P:   Reviewed health and wellness pertinent to exam  Pap smear as above  Mammogram is due and she will schedule  Refill of Valtrex for a year  Counseled on breast self exam, mammography screening, adequate intake of calcium and vitamin D, diet and exercise, Kegel's exercises return annually or prn  An After Visit Summary was printed and given to the patient.

## 2015-04-12 NOTE — Progress Notes (Signed)
Encounter reviewed by Dr. Brook Amundson C. Silva.  

## 2015-04-15 LAB — IPS PAP TEST WITH HPV

## 2015-04-18 ENCOUNTER — Encounter: Payer: Self-pay | Admitting: Podiatry

## 2015-05-09 ENCOUNTER — Ambulatory Visit: Payer: 59

## 2015-05-16 ENCOUNTER — Encounter: Payer: Self-pay | Admitting: Internal Medicine

## 2015-05-16 ENCOUNTER — Ambulatory Visit (AMBULATORY_SURGERY_CENTER): Payer: Self-pay | Admitting: *Deleted

## 2015-05-16 VITALS — Ht 64.0 in | Wt 180.0 lb

## 2015-05-16 DIAGNOSIS — Z1211 Encounter for screening for malignant neoplasm of colon: Secondary | ICD-10-CM

## 2015-05-16 NOTE — Progress Notes (Signed)
No egg or soy allergy No issues with past sedation No diet pills No home 02 use   emmi video declined  Pt requested miralax mixed in gatorade as this was her last prep. She tolerated this well. Last colon had good prep per Dr Olevia Perches

## 2015-05-30 ENCOUNTER — Ambulatory Visit (INDEPENDENT_AMBULATORY_CARE_PROVIDER_SITE_OTHER): Payer: 59 | Admitting: Ophthalmology

## 2015-06-06 ENCOUNTER — Encounter: Payer: 59 | Admitting: Internal Medicine

## 2015-06-10 ENCOUNTER — Other Ambulatory Visit: Payer: 59

## 2015-06-10 NOTE — Patient Outreach (Signed)
[facility]) Care Management  06/10/2015  [name] [birth year] [id number]   Member did not show for scheduled appointment.  Plan to send missed appointment letter. Peter Garter RN, [facility] Care Management Coordinator-Link to [place] Management [phone]

## 2015-06-13 ENCOUNTER — Ambulatory Visit (INDEPENDENT_AMBULATORY_CARE_PROVIDER_SITE_OTHER): Payer: 59 | Admitting: Podiatry

## 2015-06-13 ENCOUNTER — Encounter: Payer: Self-pay | Admitting: Podiatry

## 2015-06-13 ENCOUNTER — Ambulatory Visit (INDEPENDENT_AMBULATORY_CARE_PROVIDER_SITE_OTHER): Payer: 59

## 2015-06-13 DIAGNOSIS — M79676 Pain in unspecified toe(s): Secondary | ICD-10-CM | POA: Diagnosis not present

## 2015-06-13 DIAGNOSIS — M722 Plantar fascial fibromatosis: Secondary | ICD-10-CM

## 2015-06-13 DIAGNOSIS — B351 Tinea unguium: Secondary | ICD-10-CM

## 2015-06-13 DIAGNOSIS — M8430XA Stress fracture, unspecified site, initial encounter for fracture: Secondary | ICD-10-CM | POA: Diagnosis not present

## 2015-06-13 MED ORDER — MELOXICAM 7.5 MG PO TABS: 7.5000 mg | ORAL_TABLET | Freq: Every day | ORAL | Status: DC

## 2015-06-15 NOTE — Progress Notes (Signed)
Patient ID: Rachel Gaines, female   DOB: [birth year], 59 y.o.   MRN: [id number]  Subjective: 59 y.o. returns the office today for painful, elongated, thickened toenails which they cannot trim themself. Denies any redness or drainage around the nails. She also presents today for follow-up evaluation of continued right heel pain. She states that the right heel is "killing me" and it seems to have worsened over the last week. She states that she has pain when trying to ambulate after standing for long periods of time. She denies any swelling or redness. No recent injury or trauma. She has not changed increase her activities recently. She was having pain at night and did wake her up. Denies any claudication symptoms. Denies any acute changes since last appointment and no new complaints today. Denies any systemic complaints such as fevers, chills, nausea, vomiting.   Objective: AAO 3, NAD DP/PT pulses palpable 2/4, CRT less than 3 seconds Protective sensation intact with Simms Weinstein monofilament  Nails hypertrophic, dystrophic, elongated, brittle, discolored 9. There is tenderness overlying the nails 1-5 bilaterally. There is no surrounding erythema or drainage along the nail sites. No open lesions or pre-ulcerative lesions are identified. There is tenderness palpation along the plantar aspect of the right calcaneus at insertion the plantar fascia. There is also pain with medial to lateral compression of the calcaneus and there is pain with vibratory sensation overlying the calcaneus. There does appear to be some trace edema along the lateral aspect of the calcaneus as well. There is no erythema or increase in warmth. No pain with subtalar ankle joint range of motion. No other areas of tenderness bilateral lower extremities. No overlying edema, erythema, increased warmth. No pain with calf compression, swelling, warmth, erythema.  Assessment: Patient presents with symptomatic onychomycosis; possible  calcaneal stress fracture.   Plan: -Treatment options including alternatives, risks, complications were discussed -Nails sharply debrided 9 without complication/bleeding. -X-rays were obtained of the right heel. There is question will changes with a sclerotic line of the calcaneus concerning for stress fracture. Also given her clinical findings we'll treat for stress fracture. Cam boot was dispensed. Ice and elevation.  -Discussed daily foot inspection. If there are any changes, to call the office immediately.  -Follow-up in 2-3 weeks or sooner if any problems are to arise. In the meantime, encouraged to call the office with any questions, concerns, changes symptoms. *x-ray right heel; please do calcaneal axial with the foot views.   Celesta Gentile, DPM

## 2015-06-27 ENCOUNTER — Other Ambulatory Visit: Payer: Self-pay

## 2015-06-27 VITALS — BP 114/84 | HR 66 | Resp 16 | Ht 63.0 in | Wt 180.2 lb

## 2015-06-27 DIAGNOSIS — Z794 Long term (current) use of insulin: Principal | ICD-10-CM

## 2015-06-27 DIAGNOSIS — E119 Type 2 diabetes mellitus without complications: Secondary | ICD-10-CM

## 2015-06-27 NOTE — Patient Outreach (Signed)
[facility]) Care Management   06/27/2015  Rachel Gaines 04-26-1956 [id number]  Rachel Gaines is an 59 y.o. female.   Member seen for follow up office visit for Link to Wellness program for self management of Type 2 diabetes  Subjective: Member states she saw Dr.Russo in October and her hemoglobin A1C was 8.1 which was lower.  States he did not change any of her medications.  States she is to go back in February.  States she has had more stress over the holidays with her family and working 2 jobs.  States she has not been taking care of herself as she should.  States she is trying to watch her diet but she does eat more at night when she is at work.  States the only exercise she gets is walking her dog daily.  States her blood sugars have ranged from 70-156 in the morning.    Objective:   Review of Systems  All other systems reviewed and are negative. Did not bring glucometer for review  Physical Exam  Today's Vitals   06/27/15 1043 06/27/15 1048  BP: 114/84   Pulse: 66   Resp: 16   Height: 1.6 m (5\' 3" )   Weight: 180 lb 3.2 oz (81.738 kg)   SpO2: 96%   PainSc: 8  8     Current Medications:   Current Outpatient Prescriptions  Medication Sig Dispense Refill  . albuterol (PROVENTIL HFA;VENTOLIN HFA) 108 (90 BASE) MCG/ACT inhaler Inhale into the lungs every 6 (six) hours as needed for wheezing or shortness of breath.    Marland Kitchen aspirin EC 81 MG tablet Take 81 mg by mouth daily.    Marland Kitchen b complex vitamins tablet Take 1 tablet by mouth daily.    . Biotin (BIOTIN 5000) 5 MG CAPS Take 1 capsule by mouth daily.    . bisacodyl (DULCOLAX) 5 MG EC tablet Take 5 mg by mouth once. 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 canagliflozin (INVOKANA) 100 MG TABS tablet Take 100 mg by mouth.    Marland Kitchen CINNAMON PO Take by mouth.    . gabapentin (NEURONTIN) 300 MG capsule Take 300 mg by mouth 3 (three) times daily. Only takes at night    . glimepiride (AMARYL) 4 MG tablet Take 4 mg by mouth daily with breakfast.     . LANTUS SOLOSTAR 100 UNIT/ML Solostar Pen Inject 30 Units into the skin 2 (two) times daily. If CBG <80 in AM, take only 20 units.  1  . Magnesium 250 MG TABS Take 250 mg by mouth daily.    . meloxicam (MOBIC) 7.5 MG tablet Take 1 tablet (7.5 mg total) by mouth daily. 30 tablet 0  . Multiple Vitamin (MULTIVITAMIN) tablet Take 1 tablet by mouth daily.    . polyethylene glycol powder (MIRALAX) powder Take 1 Container by mouth once. 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 ramipril (ALTACE) 2.5 MG capsule Take 2.5 mg by mouth daily.    . simvastatin (ZOCOR) 40 MG tablet Take 40 mg by mouth daily.    . sitaGLIPtin (JANUVIA) 100 MG tablet Take 100 mg by mouth daily.    . Tavaborole (KERYDIN) 5 % SOLN Apply 1 drop topically 1 day or 1 dose. Apply 1 drop to the toenail daily. 10 mL 2  . Dermatological Products, Misc. (NUVAIL) SOLN Apply 1 drop topically daily. (Patient not taking: Reported on 06/27/2015) 1 Bottle 11  . diclofenac sodium (VOLTAREN) 1 % GEL Apply topically 4 (four) times daily.  Reported on 06/27/2015    . triamcinolone ointment (KENALOG) 0.5 % Apply 1 application topically 2 (two) times daily. Reported on 06/27/2015    . valACYclovir (VALTREX) 500 MG tablet Take 1 tablet (500 mg total) by mouth 2 (two) times daily. For 5 days (Patient not taking: Reported on 05/16/2015) 30 tablet 11   No current facility-administered medications for this visit.    Functional Status:   In your present state of health, do you have any difficulty performing the following activities: 06/27/2015 02/27/2015  Hearing? N N  Vision? N N  Difficulty concentrating or making decisions? N N  Walking or climbing stairs? N N  Dressing or bathing? N N  Doing errands, shopping? N N    Fall/Depression Screening:    PHQ 2/9 Scores 06/27/2015 02/27/2015 11/02/2014  PHQ - 2 Score 1 1 0    Assessment:   Member seen for follow up office visit for Link to Wellness program for self management of Type 2 diabetes.  Member continues to not be  at her diabetes self management goal of hemoglobin A1C of 7 or less but she has decreased it from 8.6 to 8.1.  Member reports watching CHO in diet but is getting very little exercise due to foot pain.  Member is being followed by podiatry.  Member reports increased stress and is willing to go to counseling if it does not improve.   Plan:  1. Plan to eat 30-45 GM (2-3) servings of carbohydrate a meal and 15 GM for snacks with protein.  Do not skip meals  2. Plan to check blood sugar twice a day either fasting or 1 -2hrs after a meal.  Goals are 80-130 fasting and after meals less than 180. 3. Plan to go to water aerobics twice a week after joining [employer].   Goals to get up to 150 minutes. 4. Plan to complete EMMI programs by 08/27/15 5. Plan to see Dr. Virgina Jock in February  6. Plan to return to Link to Wellness on September 05, 2015 at 10:30AM   [facility] CM Care Plan Problem One        Most Recent Value   Care Plan Problem One  Elevated blood sugars related to dx of Type 2 DM   Role Documenting the Problem One  Care Management Coordinator   Care Plan for Problem One  Active   [facility]N Long Term Goal (31-90 days)  Member will lower hemoglobin A1C by one point in the next 90 days   THN Long Term Goal Start Date  06/27/15 [Working on goal decreased hemoglobin A1C to 8.1 from 8.6]   Interventions for Problem One Long Term Goal  Reviewed CHO counting and portion control, Discussed making wise choices when getting fast food, Reinforced importance of  eating when she takes her glimepiride and to take her insulin as ordered, Discussed the role stress can have on her blood sugars and encouraged to contact the Employee Assistance Program to help with her stress, Instructed to contact MD if she starts to have depression that will not improve,  Instructed to keep appointment with Dr.Russo in February, Reinforced importance of regular exercise for glycemic control    Peter Garter RN, [facility] Care Management Coordinator-Link  to [place] Management [phone]

## 2015-06-27 NOTE — Patient Instructions (Signed)
1. Plan to eat 30-45 GM (2-3) servings of carbohydrate a meal and 15 GM for snacks with protein.  Do not skip meals  2. Plan to check blood sugar twice a day either fasting or 1 -2hrs after a meal.  Goals are 80-130 fasting and after meals less than 180. 3. Plan to go to water aerobics twice a week after joining [employer].   Goals to get up to 150 minutes. 4. Plan to complete EMMI programs by 08/27/15 5. Plan to see Dr. Virgina Jock in February  6. Plan to return to Link to Wellness on September 05, 2015 at 10:30AM

## 2015-07-07 ENCOUNTER — Ambulatory Visit: Payer: 59 | Admitting: Podiatry

## 2015-07-11 ENCOUNTER — Ambulatory Visit (INDEPENDENT_AMBULATORY_CARE_PROVIDER_SITE_OTHER): Payer: 59 | Admitting: Ophthalmology

## 2015-07-24 MED FILL — TRUE METRIX GLUCOSE TEST ST: 30 days supply | Qty: 100 | Fill #1

## 2015-07-24 MED FILL — GLIMEPIRIDE 4 MG TABLET: 4 | 90 days supply | Qty: 90 | Fill #3

## 2015-07-24 MED FILL — JANUVIA 100 MG TABLET: 100 | 90 days supply | Qty: 90 | Fill #0

## 2015-08-01 ENCOUNTER — Encounter: Payer: 59 | Admitting: Internal Medicine

## 2015-08-07 DIAGNOSIS — E1165 Type 2 diabetes mellitus with hyperglycemia: Secondary | ICD-10-CM | POA: Diagnosis not present

## 2015-08-07 DIAGNOSIS — Z0000 Encounter for general adult medical examination without abnormal findings: Secondary | ICD-10-CM | POA: Diagnosis not present

## 2015-08-08 ENCOUNTER — Ambulatory Visit (INDEPENDENT_AMBULATORY_CARE_PROVIDER_SITE_OTHER): Payer: 59 | Admitting: Podiatry

## 2015-08-08 ENCOUNTER — Encounter: Payer: Self-pay | Admitting: Podiatry

## 2015-08-08 ENCOUNTER — Ambulatory Visit (INDEPENDENT_AMBULATORY_CARE_PROVIDER_SITE_OTHER): Payer: 59

## 2015-08-08 ENCOUNTER — Ambulatory Visit (INDEPENDENT_AMBULATORY_CARE_PROVIDER_SITE_OTHER): Payer: 59 | Admitting: Ophthalmology

## 2015-08-08 VITALS — BP 102/50 | HR 67 | Resp 18

## 2015-08-08 DIAGNOSIS — R52 Pain, unspecified: Secondary | ICD-10-CM

## 2015-08-08 DIAGNOSIS — M722 Plantar fascial fibromatosis: Secondary | ICD-10-CM

## 2015-08-08 DIAGNOSIS — M79673 Pain in unspecified foot: Secondary | ICD-10-CM | POA: Diagnosis not present

## 2015-08-08 DIAGNOSIS — M8430XD Stress fracture, unspecified site, subsequent encounter for fracture with routine healing: Secondary | ICD-10-CM

## 2015-08-08 MED ORDER — MELOXICAM 7.5 MG PO TABS: 7.5000 mg | ORAL_TABLET | Freq: Every day | ORAL | Status: DC

## 2015-08-08 MED FILL — MELOXICAM 7.5 MG TABLET: 7.5 | 30 days supply | Qty: 30 | Fill #0

## 2015-08-10 NOTE — Progress Notes (Signed)
Patient ID: Rachel Gaines, female   DOB: 1955-08-08, 60 y.o.   MRN: [id number]  Subjective: 60 year old female presents the office they for follow-up evaluation of right heel pain. She states that during the week she does well however when she works on the weekend on hard surfaces she does get quite a bit of pain to her heel. Her pain is not on daily basis and appears to be worse with activity. She denies any swelling or redness. There is no tingling or numbness. She has been stretching and icing intimately. She has not been wearing a cam boot. No other complaints at this time in no acute changes since last appointment.  Objective: AAO 3, NAD DP/PT pulses palpable 2/4, CRT less than 3 seconds Protective sensation intact with Simms Weinstein monofilament  No open lesions or pre-ulcerative lesions are identified. There is tenderness to palpation to the plantar medial tubercle of the calcaneus and insertion of the plantar fascia. There is not appear to be any tenderness medial to lateral compression today there is no pain vibratory sensation. There is no overlying edema, erythema, increase in warmth. There is no pain on the course last insertion of the Achilles tendon. There is no other specific area pinpoint bony tenderness or pain the vibratory sensation. No overlying edema, erythema, increased warmth. No pain with calf compression, swelling, warmth, erythema.  Assessment: Patient presents with improving but continued heel pain right side   Plan: -Treatment options including alternatives, risks, complications were discussed -X-rays were obtained and reviewed with the patient. Sclerotic line does remain however it is very somewhat improved. Given that her pain is only Karren Cobble on hard surfaces and woodworking I do believe that she may benefit from orthotics to help support her foot when she is working. She would proceed with orthotics and she was scanned for them and they're sent to [facility] labs. If  the pain continues or worsens she should remain in the cam boot off and she has not been wearing this. Continue with stretching icing and she sizes daily. Continue supportive shoe gear all times. Follow-up in 3-4 weeks to pick up orthotics or sooner if any issues are to arise. In the meantime encouraged her to call the office with any questions, concerns, change in symptoms.  Celesta Gentile, DPM

## 2015-08-15 DIAGNOSIS — E784 Other hyperlipidemia: Secondary | ICD-10-CM | POA: Diagnosis not present

## 2015-08-15 DIAGNOSIS — E11319 Type 2 diabetes mellitus with unspecified diabetic retinopathy without macular edema: Secondary | ICD-10-CM | POA: Diagnosis not present

## 2015-08-15 DIAGNOSIS — R809 Proteinuria, unspecified: Secondary | ICD-10-CM | POA: Diagnosis not present

## 2015-08-15 DIAGNOSIS — J069 Acute upper respiratory infection, unspecified: Secondary | ICD-10-CM | POA: Diagnosis not present

## 2015-08-15 DIAGNOSIS — Z0000 Encounter for general adult medical examination without abnormal findings: Secondary | ICD-10-CM | POA: Diagnosis not present

## 2015-08-15 DIAGNOSIS — E669 Obesity, unspecified: Secondary | ICD-10-CM | POA: Diagnosis not present

## 2015-08-15 DIAGNOSIS — Z794 Long term (current) use of insulin: Secondary | ICD-10-CM | POA: Diagnosis not present

## 2015-08-15 DIAGNOSIS — Z1389 Encounter for screening for other disorder: Secondary | ICD-10-CM | POA: Diagnosis not present

## 2015-08-15 DIAGNOSIS — E1165 Type 2 diabetes mellitus with hyperglycemia: Secondary | ICD-10-CM | POA: Diagnosis not present

## 2015-08-15 MED FILL — GABAPENTIN 300 MG CAPSULE: 300 | 30 days supply | Qty: 60 | Fill #0

## 2015-09-01 MED FILL — RAMIPRIL 2.5 MG CAPSULE: 2.5 | 90 days supply | Qty: 90 | Fill #3

## 2015-09-01 MED FILL — VALACYCLOVIR HCL 500 MG TAB: 500 | 30 days supply | Qty: 30 | Fill #2

## 2015-09-01 MED FILL — INVOKANA 100 MG TABLET: 100 | 90 days supply | Qty: 90 | Fill #3

## 2015-09-01 MED FILL — SIMVASTATIN 40 MG TABLET: 40 | 90 days supply | Qty: 90 | Fill #0

## 2015-09-05 ENCOUNTER — Encounter: Payer: 59 | Admitting: Internal Medicine

## 2015-09-05 ENCOUNTER — Ambulatory Visit: Payer: 59 | Admitting: Podiatry

## 2015-09-05 ENCOUNTER — Ambulatory Visit: Payer: Self-pay

## 2015-09-11 MED FILL — LANTUS SOLOSTAR 100 UNITS/M: 100 | 90 days supply | Qty: 54 | Fill #0

## 2015-09-12 DIAGNOSIS — H524 Presbyopia: Secondary | ICD-10-CM | POA: Diagnosis not present

## 2015-09-12 DIAGNOSIS — E113312 Type 2 diabetes mellitus with moderate nonproliferative diabetic retinopathy with macular edema, left eye: Secondary | ICD-10-CM | POA: Diagnosis not present

## 2015-09-12 DIAGNOSIS — E11311 Type 2 diabetes mellitus with unspecified diabetic retinopathy with macular edema: Secondary | ICD-10-CM | POA: Diagnosis not present

## 2015-09-12 DIAGNOSIS — H2513 Age-related nuclear cataract, bilateral: Secondary | ICD-10-CM | POA: Diagnosis not present

## 2015-09-12 DIAGNOSIS — E113391 Type 2 diabetes mellitus with moderate nonproliferative diabetic retinopathy without macular edema, right eye: Secondary | ICD-10-CM | POA: Diagnosis not present

## 2015-09-12 DIAGNOSIS — H25013 Cortical age-related cataract, bilateral: Secondary | ICD-10-CM | POA: Diagnosis not present

## 2015-09-12 DIAGNOSIS — H40013 Open angle with borderline findings, low risk, bilateral: Secondary | ICD-10-CM | POA: Diagnosis not present

## 2015-09-19 ENCOUNTER — Other Ambulatory Visit: Payer: Self-pay

## 2015-09-19 VITALS — BP 110/72 | HR 75 | Resp 16 | Ht 63.0 in | Wt 188.0 lb

## 2015-09-19 DIAGNOSIS — Z794 Long term (current) use of insulin: Principal | ICD-10-CM

## 2015-09-19 DIAGNOSIS — E119 Type 2 diabetes mellitus without complications: Secondary | ICD-10-CM

## 2015-09-19 NOTE — Patient Instructions (Signed)
1. Plan to eat 30-45 GM (2-3) servings of carbohydrate a meal and 15 GM for snacks with protein.  Do not skip meals  2. Plan to check blood sugar twice a day either fasting or 1 -2hrs after a meal.  Try checking blood sugar after eating at work.  Goals are 80-130 fasting and after meals less than 180.  Please bring glucometer and blood sugars log to next meeting 3. Plan to go to water aerobics twice a week after joining [employer].   Goals to get up to 150 minutes. 4. Plan to complete EMMI programs by 10/27/15 5. Plan to call Monday Employee Assistance for an appointment [phone] 6. Plan to see Dr. Virgina Jock in May 7. Plan to return to Link to Wellness on 12/05/15 at 10:30AM
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
## 2015-09-26 ENCOUNTER — Ambulatory Visit (INDEPENDENT_AMBULATORY_CARE_PROVIDER_SITE_OTHER): Payer: 59 | Admitting: Podiatry

## 2015-09-26 ENCOUNTER — Encounter: Payer: Self-pay | Admitting: Podiatry

## 2015-09-26 ENCOUNTER — Telehealth: Payer: Self-pay | Admitting: *Deleted

## 2015-09-26 VITALS — BP 98/46 | HR 82 | Resp 18

## 2015-09-26 DIAGNOSIS — M722 Plantar fascial fibromatosis: Secondary | ICD-10-CM | POA: Diagnosis not present

## 2015-09-26 DIAGNOSIS — M79673 Pain in unspecified foot: Secondary | ICD-10-CM

## 2015-09-26 MED ORDER — MELOXICAM 7.5 MG PO TABS: 7.5000 mg | ORAL_TABLET | Freq: Every day | ORAL | Status: DC

## 2015-09-26 MED ORDER — NONFORMULARY OR COMPOUNDED ITEM: Status: DC

## 2015-09-26 MED FILL — MELOXICAM 7.5 MG TABLET: 7.5 | 30 days supply | Qty: 30 | Fill #0

## 2015-09-26 NOTE — Telephone Encounter (Signed)
Dr. Jacqualyn Posey ordered [place] antiinflammatory cream. Faxed.

## 2015-09-26 NOTE — Progress Notes (Signed)
Patient ID: Rachel Gaines, female   DOB: 1955/12/06, 60 y.o.   MRN: [id number]  Subjective: 60 year old female presents the office they for follow-up evaluation of right heel pain. She states that he has continued to have pain to her right heel and she wishes to hold off on any steroid injection. She states the majority of her pain is when she first gets up or when she gets and from work. She does continue to wear shoes which seems to help. She is continuing stretching, icing. She denies any recent injury or trauma. There is no swelling or redness. No tingling or numbness. The pain does not wake her up at night. No other complaints at this time.  Objective: AAO 3, NAD DP/PT pulses palpable 2/4, CRT less than 3 seconds Protective sensation intact with Simms Weinstein monofilament  No open lesions or pre-ulcerative lesions are identified. There is continued tenderness to palpation to the plantar medial tubercle of the calcaneus and insertion of the plantar fascia. There is not appear to be any tenderness medial to lateral compression today there is no pain vibratory sensation to the calcaneus or other areas of the foot. There is no overlying edema, erythema, increase in warmth. There is no pain on the course last insertion of the Achilles tendon. There is no other specific area pinpoint bony tenderness or pain the vibratory sensation. No overlying edema, erythema, increased warmth. No pain with calf compression, swelling, warmth, erythema.  Assessment: Patient presents with heel pain likely result stress fracture with plantar fasciitis.  Plan: -Treatment options including alternatives, risks, complications were discussed -At his home discussed steroid injection but she wishes to hold off. I discussed, concrete is ordered today. -Orthotics were dispensed today. Oral and written break in instructions were discussed. -Continue stretching, icing exercises as well as shoe gear  modifications. -Follow-up in 4 weeks or sooner if any issues are to arise. Call any questions or concerns.  Celesta Gentile, DPM

## 2015-10-27 MED FILL — JANUVIA 100 MG TABLET: 100 | 90 days supply | Qty: 90 | Fill #1

## 2015-10-27 MED FILL — GLIMEPIRIDE 4 MG TABLET: 4 | 90 days supply | Qty: 90 | Fill #0

## 2015-10-27 MED FILL — GABAPENTIN 300 MG CAPSULE: 300 | 30 days supply | Qty: 60 | Fill #1

## 2015-10-31 ENCOUNTER — Ambulatory Visit (INDEPENDENT_AMBULATORY_CARE_PROVIDER_SITE_OTHER): Payer: 59 | Admitting: Podiatry

## 2015-10-31 ENCOUNTER — Encounter: Payer: Self-pay | Admitting: Podiatry

## 2015-10-31 VITALS — BP 101/52 | HR 80 | Resp 16

## 2015-10-31 DIAGNOSIS — M722 Plantar fascial fibromatosis: Secondary | ICD-10-CM

## 2015-10-31 DIAGNOSIS — L03031 Cellulitis of right toe: Secondary | ICD-10-CM

## 2015-10-31 DIAGNOSIS — M79673 Pain in unspecified foot: Secondary | ICD-10-CM

## 2015-10-31 DIAGNOSIS — M79676 Pain in unspecified toe(s): Secondary | ICD-10-CM | POA: Diagnosis not present

## 2015-10-31 DIAGNOSIS — B351 Tinea unguium: Secondary | ICD-10-CM

## 2015-10-31 MED ORDER — DOXYCYCLINE HYCLATE 100 MG PO TABS: 100.0000 mg | ORAL_TABLET | Freq: Two times a day (BID) | ORAL | Status: DC

## 2015-10-31 MED FILL — DOXYCYCLINE HYCLATE 100 MG: 100 | 10 days supply | Qty: 20 | Fill #0

## 2015-10-31 NOTE — Patient Instructions (Signed)

## 2015-11-03 NOTE — Progress Notes (Signed)
Patient ID: Rachel Gaines, female   DOB: March 06, 1956, 60 y.o.   MRN: [id number]  Subjective: 60 year old female persist the also follow up evaluation of right heel pain. She states that the pain is somewhat better compared to last appointment. She still gets some pain to the heel but she does not want any steroid injections. She has been stretching icing at home. The plantar fascial braces at helping. She also presents today for thick, painful, elongated toenails. She denies any redness, drainage to the toenails have the nails are painful. Denies any systemic complaints such as fevers, chills, nausea, vomiting. No acute changes since last appointment, and no other complaints at this time.   Objective: AAO x3, NAD DP/PT pulses palpable bilaterally, CRT less than 3 seconds Protective sensation intact with Simms Weinstein monofilament There is continued tenderness to palpation of the plantar medial tubercle of the calcaneal insertion of the plantar fascia on the right side. There is no pain to medial to lateral compression of calcaneus. There is no overlying edema. Plantar fascia appears intact. No family Achilles tendon. Nails are hypertrophic, dystrophic, brittle, discolored, elongated 10. Upon debridement of the right hallux toenail there was some serosanguineous drainage expressed. The nails loose from the underlying nail bed. There is no surrounding erythema, red streaks. No malodor. There is tenderness to nails 1-5 bilaterally. No areas of pinpoint bony tenderness or pain with vibratory sensation. MMT 5/5, ROM WNL. No edema, erythema, increase in warmth to bilateral lower extremities.  No open lesions or pre-ulcerative lesions.  No pain with calf compression, swelling, warmth, erythema  Assessment: Continued heel pain likely plantar fasciitis as well as symptomatic onychomycosis with abscess right hallux toenail  Plan: -All treatment options discussed with the patient including all  alternatives, risks, complications.  -At this time discussed physical therapy for the heel pain which she be open to. She wishes to further medications. Continue stretching, icing. Perception for physical therapy provided today. -Nails to be 10. Upon debridement right hallux wound there was drainage expressed. The nails debrided. She wishes on total nail avulsion. Recommended Epson salt soaks as well as Neosporin and a bandage. Ability signs or symptoms of worsening infection of the areas not resolved within 1-2 weeks to call the office. Otherwise I'll see her back in 3 months. -Patient encouraged to call the office with any questions, concerns, change in symptoms.   Celesta Gentile, DPM

## 2015-11-21 ENCOUNTER — Ambulatory Visit: Payer: 59 | Admitting: Podiatry

## 2015-12-01 MED FILL — LANTUS SOLOSTAR 100 UNITS/M: 100 | 90 days supply | Qty: 54 | Fill #1

## 2015-12-01 MED FILL — RAMIPRIL 2.5 MG CAPSULE: 2.5 | 90 days supply | Qty: 45 | Fill #0

## 2015-12-01 MED FILL — SIMVASTATIN 40 MG TABLET: 40 | 90 days supply | Qty: 90 | Fill #1

## 2015-12-02 MED FILL — MELOXICAM 7.5 MG TABLET: 7.5 | 30 days supply | Qty: 30 | Fill #1

## 2015-12-02 MED FILL — TRUE METRIX GLUCOSE TEST ST: 30 days supply | Qty: 100 | Fill #2

## 2015-12-05 ENCOUNTER — Ambulatory Visit: Payer: Self-pay

## 2015-12-12 DIAGNOSIS — E668 Other obesity: Secondary | ICD-10-CM | POA: Diagnosis not present

## 2015-12-12 DIAGNOSIS — Z794 Long term (current) use of insulin: Secondary | ICD-10-CM | POA: Diagnosis not present

## 2015-12-12 DIAGNOSIS — Z780 Asymptomatic menopausal state: Secondary | ICD-10-CM | POA: Diagnosis not present

## 2015-12-12 DIAGNOSIS — F418 Other specified anxiety disorders: Secondary | ICD-10-CM | POA: Diagnosis not present

## 2015-12-12 DIAGNOSIS — E1165 Type 2 diabetes mellitus with hyperglycemia: Secondary | ICD-10-CM | POA: Diagnosis not present

## 2015-12-12 DIAGNOSIS — Z6830 Body mass index (BMI) 30.0-30.9, adult: Secondary | ICD-10-CM | POA: Diagnosis not present

## 2015-12-19 MED FILL — INVOKANA 100 MG TABLET: 100 | 90 days supply | Qty: 90 | Fill #0

## 2016-01-01 ENCOUNTER — Ambulatory Visit: Payer: Self-pay

## 2016-01-16 ENCOUNTER — Other Ambulatory Visit: Payer: Self-pay | Admitting: Podiatry

## 2016-01-16 ENCOUNTER — Telehealth: Payer: Self-pay | Admitting: *Deleted

## 2016-01-16 MED FILL — MELOXICAM 7.5 MG TABLET: 7.5 | 30 days supply | Qty: 30 | Fill #0

## 2016-01-16 MED FILL — GABAPENTIN 300 MG CAPSULE: 300 | 30 days supply | Qty: 60 | Fill #2

## 2016-01-16 NOTE — Telephone Encounter (Signed)
Pt needs and appt prior to future refills.

## 2016-01-16 NOTE — Telephone Encounter (Signed)
Pt left message, but only able to understand DOB and phone number.  Looked up in EPIC by DOB and phone number. Left message it call with her question.

## 2016-01-23 MED FILL — RAMIPRIL 2.5 MG CAPSULE: 2.5 | 30 days supply | Qty: 15 | Fill #1

## 2016-02-10 MED FILL — JANUVIA 100 MG TABLET: 100 | 90 days supply | Qty: 90 | Fill #2

## 2016-02-10 MED FILL — GLIMEPIRIDE 4 MG TABLET: 4 | 90 days supply | Qty: 90 | Fill #1

## 2016-02-24 MED FILL — VALACYCLOVIR HCL 500 MG TAB: 500 | 30 days supply | Qty: 30 | Fill #3

## 2016-02-24 MED FILL — RAMIPRIL 2.5 MG CAPSULE: 2.5 | 30 days supply | Qty: 15 | Fill #2

## 2016-02-27 MED FILL — SIMVASTATIN 40 MG TABLET: 40 | 90 days supply | Qty: 90 | Fill #2

## 2016-03-05 ENCOUNTER — Encounter (INDEPENDENT_AMBULATORY_CARE_PROVIDER_SITE_OTHER): Payer: 59 | Admitting: Ophthalmology

## 2016-04-01 MED FILL — LANTUS SOLOSTAR 100 UNITS/M: 100 | 90 days supply | Qty: 54 | Fill #2

## 2016-04-01 MED FILL — GABAPENTIN 300 MG CAPSULE: 300 | 30 days supply | Qty: 60 | Fill #3

## 2016-04-09 DIAGNOSIS — M25552 Pain in left hip: Secondary | ICD-10-CM | POA: Diagnosis not present

## 2016-04-09 DIAGNOSIS — Z6830 Body mass index (BMI) 30.0-30.9, adult: Secondary | ICD-10-CM | POA: Diagnosis not present

## 2016-04-09 DIAGNOSIS — E11319 Type 2 diabetes mellitus with unspecified diabetic retinopathy without macular edema: Secondary | ICD-10-CM | POA: Diagnosis not present

## 2016-04-09 DIAGNOSIS — E1165 Type 2 diabetes mellitus with hyperglycemia: Secondary | ICD-10-CM | POA: Diagnosis not present

## 2016-04-09 DIAGNOSIS — E668 Other obesity: Secondary | ICD-10-CM | POA: Diagnosis not present

## 2016-04-09 DIAGNOSIS — Z794 Long term (current) use of insulin: Secondary | ICD-10-CM | POA: Diagnosis not present

## 2016-04-09 DIAGNOSIS — F418 Other specified anxiety disorders: Secondary | ICD-10-CM | POA: Diagnosis not present

## 2016-04-09 MED FILL — RAMIPRIL 2.5 MG CAPSULE: 2.5 | 90 days supply | Qty: 45 | Fill #0

## 2016-04-16 ENCOUNTER — Other Ambulatory Visit: Payer: Self-pay

## 2016-04-16 MED FILL — INVOKANA 100 MG TABLET: 100 | 90 days supply | Qty: 90 | Fill #1

## 2016-04-16 NOTE — Telephone Encounter (Signed)
Medication refill request: valACYclovir HCL 500mg  Last AEX:  04/11/15 PG Next AEX: none scheduled Last MMG (if hormonal medication request): 12/06/13 BIRADS3 probably benign Refill authorized: 04/11/15 #30 w/11refills; today please advise

## 2016-04-21 ENCOUNTER — Other Ambulatory Visit: Payer: Self-pay

## 2016-04-21 DIAGNOSIS — B009 Herpesviral infection, unspecified: Secondary | ICD-10-CM

## 2016-04-21 MED ORDER — VALACYCLOVIR HCL 500 MG PO TABS: 500.0000 mg | ORAL_TABLET | Freq: Two times a day (BID) | ORAL | 0 refills | Status: DC

## 2016-04-21 NOTE — Telephone Encounter (Signed)
Medication refill request: valACYclovir 500mg  Last AEX:  04/11/15 PG Next AEX: none scheduled  Last MMG (if hormonal medication request): 12/06/13 BIRADS 3 probably benign Refill authorized: 04/11/15 #30 w/11 refills; today please advise

## 2016-04-22 MED FILL — VALACYCLOVIR HCL 500 MG TAB: 500 | 90 days supply | Qty: 30 | Fill #0

## 2016-04-23 ENCOUNTER — Ambulatory Visit (INDEPENDENT_AMBULATORY_CARE_PROVIDER_SITE_OTHER): Payer: 59 | Admitting: Ophthalmology

## 2016-04-23 DIAGNOSIS — H35033 Hypertensive retinopathy, bilateral: Secondary | ICD-10-CM

## 2016-04-23 DIAGNOSIS — E11319 Type 2 diabetes mellitus with unspecified diabetic retinopathy without macular edema: Secondary | ICD-10-CM

## 2016-04-23 DIAGNOSIS — I10 Essential (primary) hypertension: Secondary | ICD-10-CM

## 2016-04-23 DIAGNOSIS — H43813 Vitreous degeneration, bilateral: Secondary | ICD-10-CM | POA: Diagnosis not present

## 2016-04-23 DIAGNOSIS — E113293 Type 2 diabetes mellitus with mild nonproliferative diabetic retinopathy without macular edema, bilateral: Secondary | ICD-10-CM | POA: Diagnosis not present

## 2016-05-21 MED FILL — JANUVIA 100 MG TABLET: 100 | 90 days supply | Qty: 90 | Fill #0

## 2016-05-21 MED FILL — GLIMEPIRIDE 4 MG TABLET: 4 | 90 days supply | Qty: 90 | Fill #2

## 2016-05-24 DIAGNOSIS — H40013 Open angle with borderline findings, low risk, bilateral: Secondary | ICD-10-CM | POA: Diagnosis not present

## 2016-05-24 DIAGNOSIS — H04123 Dry eye syndrome of bilateral lacrimal glands: Secondary | ICD-10-CM | POA: Diagnosis not present

## 2016-06-04 MED FILL — GABAPENTIN 300 MG CAPSULE: 300 | 30 days supply | Qty: 60 | Fill #4

## 2016-06-04 MED FILL — SIMVASTATIN 40 MG TABLET: 40 | 90 days supply | Qty: 90 | Fill #3

## 2016-07-01 MED FILL — RAMIPRIL 2.5 MG CAPSULE: 2.5 | 90 days supply | Qty: 45 | Fill #1

## 2016-07-01 MED FILL — LANTUS SOLOSTAR 100 UNITS/M: 100 | 90 days supply | Qty: 54 | Fill #3

## 2016-07-05 MED FILL — UNIFINE PENTIPS 8MM 31G: 31G X 8 MM | 90 days supply | Qty: 200 | Fill #0

## 2016-07-23 MED FILL — INVOKANA 100 MG TABLET: 100 | 90 days supply | Qty: 90 | Fill #2

## 2016-08-05 MED FILL — GABAPENTIN 300 MG CAPSULE: 300 | 30 days supply | Qty: 60 | Fill #5

## 2016-08-13 DIAGNOSIS — E784 Other hyperlipidemia: Secondary | ICD-10-CM | POA: Diagnosis not present

## 2016-08-13 DIAGNOSIS — E1165 Type 2 diabetes mellitus with hyperglycemia: Secondary | ICD-10-CM | POA: Diagnosis not present

## 2016-08-13 DIAGNOSIS — Z0000 Encounter for general adult medical examination without abnormal findings: Secondary | ICD-10-CM | POA: Diagnosis not present

## 2016-08-20 DIAGNOSIS — Z1389 Encounter for screening for other disorder: Secondary | ICD-10-CM | POA: Diagnosis not present

## 2016-08-20 DIAGNOSIS — E11319 Type 2 diabetes mellitus with unspecified diabetic retinopathy without macular edema: Secondary | ICD-10-CM | POA: Diagnosis not present

## 2016-08-20 DIAGNOSIS — Z794 Long term (current) use of insulin: Secondary | ICD-10-CM | POA: Diagnosis not present

## 2016-08-20 DIAGNOSIS — Z0000 Encounter for general adult medical examination without abnormal findings: Secondary | ICD-10-CM | POA: Diagnosis not present

## 2016-08-20 DIAGNOSIS — E1165 Type 2 diabetes mellitus with hyperglycemia: Secondary | ICD-10-CM | POA: Diagnosis not present

## 2016-08-20 DIAGNOSIS — E668 Other obesity: Secondary | ICD-10-CM | POA: Diagnosis not present

## 2016-08-20 DIAGNOSIS — F418 Other specified anxiety disorders: Secondary | ICD-10-CM | POA: Diagnosis not present

## 2016-08-20 DIAGNOSIS — M25552 Pain in left hip: Secondary | ICD-10-CM | POA: Diagnosis not present

## 2016-08-20 DIAGNOSIS — E784 Other hyperlipidemia: Secondary | ICD-10-CM | POA: Diagnosis not present

## 2016-08-20 DIAGNOSIS — R808 Other proteinuria: Secondary | ICD-10-CM | POA: Diagnosis not present

## 2016-08-26 MED FILL — JANUVIA 100 MG TABLET: 100 | 90 days supply | Qty: 90 | Fill #1

## 2016-08-26 MED FILL — GLIMEPIRIDE 4 MG TABLET: 4 | 90 days supply | Qty: 90 | Fill #3

## 2016-09-02 MED FILL — SIMVASTATIN 40 MG TABLET: 40 | 90 days supply | Qty: 90 | Fill #0

## 2016-09-27 MED FILL — GABAPENTIN 300 MG CAPSULE: 300 | 30 days supply | Qty: 60 | Fill #0

## 2016-09-27 MED FILL — RAMIPRIL 2.5 MG CAPSULE: 2.5 | 90 days supply | Qty: 45 | Fill #2

## 2016-09-27 MED FILL — LANTUS SOLOSTAR 100 UNITS/M: 100 | 90 days supply | Qty: 54 | Fill #0

## 2016-10-15 ENCOUNTER — Ambulatory Visit (INDEPENDENT_AMBULATORY_CARE_PROVIDER_SITE_OTHER): Payer: 59 | Admitting: Nurse Practitioner

## 2016-10-15 ENCOUNTER — Encounter: Payer: Self-pay | Admitting: Nurse Practitioner

## 2016-10-15 VITALS — BP 110/74 | HR 72 | Ht 63.5 in | Wt 177.0 lb

## 2016-10-15 DIAGNOSIS — Z1212 Encounter for screening for malignant neoplasm of rectum: Secondary | ICD-10-CM

## 2016-10-15 DIAGNOSIS — Z1231 Encounter for screening mammogram for malignant neoplasm of breast: Secondary | ICD-10-CM | POA: Diagnosis not present

## 2016-10-15 DIAGNOSIS — Z0000 Encounter for general adult medical examination without abnormal findings: Secondary | ICD-10-CM | POA: Diagnosis not present

## 2016-10-15 DIAGNOSIS — B009 Herpesviral infection, unspecified: Secondary | ICD-10-CM | POA: Diagnosis not present

## 2016-10-15 DIAGNOSIS — E119 Type 2 diabetes mellitus without complications: Secondary | ICD-10-CM

## 2016-10-15 DIAGNOSIS — Z01411 Encounter for gynecological examination (general) (routine) with abnormal findings: Secondary | ICD-10-CM

## 2016-10-15 DIAGNOSIS — Z1211 Encounter for screening for malignant neoplasm of colon: Secondary | ICD-10-CM | POA: Diagnosis not present

## 2016-10-15 DIAGNOSIS — E559 Vitamin D deficiency, unspecified: Secondary | ICD-10-CM

## 2016-10-15 DIAGNOSIS — N898 Other specified noninflammatory disorders of vagina: Secondary | ICD-10-CM

## 2016-10-15 MED ORDER — VALACYCLOVIR HCL 1 G PO TABS: ORAL_TABLET | ORAL | 3 refills | Status: DC

## 2016-10-15 MED FILL — valACYclovir HCL 1 GM TABS: 1 | 45 days supply | Qty: 90 | Fill #0

## 2016-10-15 NOTE — Progress Notes (Signed)
Patient ID: Rachel Gaines, female   DOB: 1955-12-07, 61 y.o.   MRN: [id number]  61 y.o. [id number] Single  African American Fe here for annual exam.  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  She is having more flare of HSV and would like to go back and take daily for a while until stress is decreased.  Not dating in yrs.  Not SA in 15 yrs.  Complains about vaginal odor, no discharge is noted.  Patient's last menstrual period was 06/28/2006 (approximate).          Sexually active: No.  The current method of family planning is post menopausal status.    Exercising: No.  The patient does not participate in regular exercise at present. Smoker:  no  Health Maintenance: Pap: 04/11/15, Negative with neg HR HPV  12/21/12, ASCUS with neg HR HPV  History of Abnormal Pap: yes MMG: 10/15/16, no report at time of visit Self Breast exams: yes Colonoscopy: 08/19/04, normal, repeat in 10 years BMD: 05/23/07 T Score: unable to see images, / more recently with Dr. Virgina Jock TDaP: UTD with employer Shingles: Never Pneumonia: 2016, will have 2nd at next follow up with Dr. Virgina Jock Hep C and HIV: done today Labs: PCP takes care of all labs   reports that she has never smoked. She has never used smokeless tobacco. She reports that she does not drink alcohol or use drugs.  Past Medical History:  Diagnosis Date  . Abnormal Pap smear of cervix 2014   ASCUS with negative HR HPV   . Anemia   . Cataract   . Diabetes mellitus without complication ([place])   . Hyperlipidemia    on medicine  . Plantar fasciitis   . 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 COLONOSCOPY    . COLPOSCOPY W/ BIOPSY / CURETTAGE  02/04/1999   Chronic cervicitis    Current Outpatient Prescriptions  Medication Sig Dispense Refill  . albuterol (PROVENTIL HFA;VENTOLIN HFA) 108 (90 BASE) MCG/ACT inhaler Inhale into the lungs every 6 (six) hours as needed for wheezing  or shortness of breath.    Marland Kitchen aspirin EC 81 MG tablet Take 81 mg by mouth daily.    Marland Kitchen b complex vitamins tablet Take 1 tablet by mouth daily.    . Biotin (BIOTIN 5000) 5 MG CAPS Take 1 capsule by mouth daily.    . bisacodyl (DULCOLAX) 5 MG EC tablet Take 5 mg by mouth once. 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 canagliflozin (INVOKANA) 100 MG TABS tablet Take 100 mg by mouth.    Marland Kitchen CINNAMON PO Take by mouth. Reported on 09/19/2015    . Dermatological Products, Misc. (NUVAIL) SOLN Apply 1 drop topically daily. 1 Bottle 11  . gabapentin (NEURONTIN) 300 MG capsule Take 300 mg by mouth 3 (three) times daily. Only takes at night    . glimepiride (AMARYL) 4 MG tablet Take 4 mg by mouth daily with breakfast.    . LANTUS SOLOSTAR 100 UNIT/ML Solostar Pen Inject 30 Units into the skin 2 (two) times daily. If CBG <80 in AM, take only 20 units.  1  . Magnesium 250 MG TABS Take 250 mg by mouth daily. Reported on 09/19/2015    . Multiple Vitamin (MULTIVITAMIN) tablet Take 1 tablet by mouth daily. Reported on 09/19/2015    . NONFORMULARY OR COMPOUNDED ITEM [facility]:  Antiinflammatory cream - Diclofenac 3%, Baclofen  2%, Cyclobenzaprine 2%, Lidocaine 2%, dispense 120grams, apply 1-2 grams to affected area 3-4 times a day, +2refills. 120 each 2  . ramipril (ALTACE) 2.5 MG capsule Take 2.5 mg by mouth every other day.     . simvastatin (ZOCOR) 40 MG tablet Take 40 mg by mouth daily.    . sitaGLIPtin (JANUVIA) 100 MG tablet Take 100 mg by mouth daily.    . valACYclovir (VALTREX) 500 MG tablet Take 1 tablet (500 mg total) by mouth 2 (two) times daily. For 5 days 30 tablet 0   No current facility-administered medications for this visit.     Family History  Problem Relation Age of Onset  . Diabetes Mother   . Diabetes Father   . Heart disease Father   . Cancer Father     Prostate  . Heart failure Father   . Heart disease Sister   . Diabetes Sister   . Diabetes Brother   . Lupus Daughter   . Cancer Brother     lung  cancer  . Diabetes Brother   . Diabetes Sister   . Heart disease Sister   . Heart failure Sister   . Thyroid disease Sister   . Diabetes Sister   . Diabetes Sister   . Diabetes Sister   . Diabetes Sister   . Diabetes Sister   . Diabetes Sister   . Colon cancer Neg Hx   . Colon polyps Neg Hx   . Rectal cancer Neg Hx   . Stomach cancer Neg Hx   . Esophageal cancer Neg Hx     ROS:  Pertinent items are noted in HPI.  Otherwise, a comprehensive ROS was negative.  Exam:   BP 110/74 (BP Location: Right Arm, Patient Position: Sitting, Cuff Size: Large)   Pulse 72   Ht 5' 3.5" (1.613 m)   Wt 177 lb (80.3 kg)   LMP 06/28/2006 (Approximate)   BMI 30.86 kg/m  Height: 5' 3.5" (161.3 cm) Ht Readings from Last 3 Encounters:  10/15/16 5' 3.5" (1.613 m)  09/19/15 5\' 3"  (1.6 m)  06/27/15 5\' 3"  (1.6 m)    General appearance: alert, cooperative and appears stated age Head: Normocephalic, without obvious abnormality, atraumatic Neck: no adenopathy, supple, symmetrical, trachea midline and thyroid normal to inspection and palpation Lungs: clear to auscultation bilaterally Breasts: normal appearance, no masses or tenderness Heart: regular rate and rhythm Abdomen: soft, non-tender; no masses,  no organomegaly Extremities: extremities normal, atraumatic, no cyanosis or edema Skin: Skin color, texture, turgor normal. No rashes or lesions Lymph nodes: Cervical, supraclavicular, and axillary nodes normal. No abnormal inguinal nodes palpated Neurologic: Grossly normal   Pelvic: External genitalia:  no lesions              Urethra:  normal appearing urethra with no masses, tenderness or lesions              Bartholin's and Skene's: normal                 Vagina: atrophic appearing vagina with normal color and discharge, no lesions              Cervix: anteverted              Pap taken: No. Bimanual Exam:  Uterus:  normal size, contour, position, consistency, mobility, non-tender               Adnexa: no mass, fullness, tenderness  Rectovaginal: Confirms               Anus:  normal sphincter tone, no lesions  Chaperone present: yes  A:  Well Woman with normal exam  Postmenopausal no HRT             S/P colpo biopsy 02/04/1999 with cervicitis               History of ASCUS 2014 with negative HR HPV             History of DM, HTN, hypercholesterolemia             History of HSV with increased outbreaks  Recent stressors  Vaginal odor with atrophic vaginitis               P:   Reviewed health and wellness pertinent to exam  Pap smear: no  Mammogram is due 4/19 if today's is normal  Will get OTC Replens for vaginal dryness and use at least once a week for awhile.  will follow with [facility]  Referral placed with Dr. Collene Mares for repeat colonoscopy  Will follow with labs  Counseled on breast self exam, mammography screening, adequate intake of calcium and vitamin D, diet and exercise return annually or prn  An After Visit Summary was printed and given to the patient.

## 2016-10-15 NOTE — Patient Instructions (Signed)

## 2016-10-16 LAB — VITAMIN D 25 HYDROXY (VIT D DEFICIENCY, FRACTURES): Vit D, 25-Hydroxy: 11 ng/mL — ABNORMAL LOW (ref 30–100)

## 2016-10-16 LAB — WET PREP BY MOLECULAR PROBE
Candida species: NOT DETECTED
Gardnerella vaginalis: NOT DETECTED
Trichomonas vaginosis: NOT DETECTED

## 2016-10-16 LAB — HIV ANTIBODY (ROUTINE TESTING W REFLEX): HIV 1&2 Ab, 4th Generation: NONREACTIVE

## 2016-10-16 LAB — HEPATITIS C ANTIBODY: HCV Ab: NEGATIVE

## 2016-10-17 NOTE — Progress Notes (Signed)
Encounter reviewed by Dr. Barnie Sopko Amundson C. Silva.  

## 2016-10-18 ENCOUNTER — Telehealth: Payer: Self-pay | Admitting: *Deleted

## 2016-10-18 NOTE — Telephone Encounter (Signed)
I have attempted to contact this patient by phone with the following results: mailbox full, unable to leave message. I will continue to try later.

## 2016-10-18 NOTE — Telephone Encounter (Signed)
-----   Message from Kem Boroughs, [place] sent at 10/18/2016  8:34 AM EDT ----- Please let pt know that HIV and Hep C were negative as expected.  The Vit D was very low at 11 and needs Vit D per protocol.  The vaginal wet prep was negative for infection.

## 2016-10-20 NOTE — Telephone Encounter (Signed)
I have attempted to contact this patient by phone with the following results: mailbox full, unable to leave message. I will continue to try later. [phone] (Mobile) *Preferred*

## 2016-10-21 MED ORDER — VITAMIN D (ERGOCALCIFEROL) 1.25 MG (50000 UNIT) PO CAPS: 50000.0000 [IU] | ORAL_CAPSULE | ORAL | 1 refills | Status: DC

## 2016-10-21 MED FILL — VIT D2 1.25 MG (50,000 UNIT: 1.25 MG | 84 days supply | Qty: 24 | Fill #0

## 2016-10-21 NOTE — Telephone Encounter (Signed)
Pt notified in result note.  Closing encounter. 

## 2016-10-21 NOTE — Addendum Note (Signed)
Addended by: Graylon Good on: 10/21/2016 10:43 AM   Modules accepted: Orders

## 2016-10-22 ENCOUNTER — Ambulatory Visit (INDEPENDENT_AMBULATORY_CARE_PROVIDER_SITE_OTHER): Payer: 59 | Admitting: Ophthalmology

## 2016-10-28 ENCOUNTER — Encounter: Payer: Self-pay | Admitting: Nurse Practitioner

## 2016-10-28 DIAGNOSIS — R140 Abdominal distension (gaseous): Secondary | ICD-10-CM | POA: Diagnosis not present

## 2016-10-28 DIAGNOSIS — K580 Irritable bowel syndrome with diarrhea: Secondary | ICD-10-CM | POA: Diagnosis not present

## 2016-10-28 DIAGNOSIS — Z1211 Encounter for screening for malignant neoplasm of colon: Secondary | ICD-10-CM | POA: Diagnosis not present

## 2016-10-29 ENCOUNTER — Ambulatory Visit (INDEPENDENT_AMBULATORY_CARE_PROVIDER_SITE_OTHER): Payer: 59 | Admitting: Ophthalmology

## 2016-11-15 MED FILL — INVOKANA 100 MG TABLET: 100 | 90 days supply | Qty: 90 | Fill #0

## 2016-12-01 MED FILL — GLIMEPIRIDE 4 MG TABLET: 4 | 90 days supply | Qty: 90 | Fill #0

## 2016-12-01 MED FILL — SIMVASTATIN 40 MG TABLET: 40 | 90 days supply | Qty: 90 | Fill #1

## 2016-12-01 MED FILL — GABAPENTIN 300 MG CAPSULE: 300 | 30 days supply | Qty: 60 | Fill #1

## 2016-12-01 MED FILL — JANUVIA 100 MG TABLET: 100 | 90 days supply | Qty: 90 | Fill #2

## 2016-12-07 DIAGNOSIS — R5383 Other fatigue: Secondary | ICD-10-CM | POA: Diagnosis not present

## 2016-12-07 DIAGNOSIS — E1165 Type 2 diabetes mellitus with hyperglycemia: Secondary | ICD-10-CM | POA: Diagnosis not present

## 2016-12-07 DIAGNOSIS — E668 Other obesity: Secondary | ICD-10-CM | POA: Diagnosis not present

## 2016-12-07 DIAGNOSIS — M5489 Other dorsalgia: Secondary | ICD-10-CM | POA: Diagnosis not present

## 2016-12-07 DIAGNOSIS — E11319 Type 2 diabetes mellitus with unspecified diabetic retinopathy without macular edema: Secondary | ICD-10-CM | POA: Diagnosis not present

## 2016-12-07 DIAGNOSIS — Z794 Long term (current) use of insulin: Secondary | ICD-10-CM | POA: Diagnosis not present

## 2016-12-07 DIAGNOSIS — M1990 Unspecified osteoarthritis, unspecified site: Secondary | ICD-10-CM | POA: Diagnosis not present

## 2016-12-07 DIAGNOSIS — Z6831 Body mass index (BMI) 31.0-31.9, adult: Secondary | ICD-10-CM | POA: Diagnosis not present

## 2016-12-07 DIAGNOSIS — E559 Vitamin D deficiency, unspecified: Secondary | ICD-10-CM | POA: Diagnosis not present

## 2016-12-09 DIAGNOSIS — H40013 Open angle with borderline findings, low risk, bilateral: Secondary | ICD-10-CM | POA: Diagnosis not present

## 2016-12-09 DIAGNOSIS — H2513 Age-related nuclear cataract, bilateral: Secondary | ICD-10-CM | POA: Diagnosis not present

## 2016-12-09 DIAGNOSIS — H25013 Cortical age-related cataract, bilateral: Secondary | ICD-10-CM | POA: Diagnosis not present

## 2016-12-09 DIAGNOSIS — H25042 Posterior subcapsular polar age-related cataract, left eye: Secondary | ICD-10-CM | POA: Diagnosis not present

## 2016-12-22 ENCOUNTER — Other Ambulatory Visit: Payer: Self-pay | Admitting: Internal Medicine

## 2016-12-22 DIAGNOSIS — M545 Low back pain, unspecified: Secondary | ICD-10-CM

## 2016-12-24 ENCOUNTER — Ambulatory Visit (INDEPENDENT_AMBULATORY_CARE_PROVIDER_SITE_OTHER): Payer: 59 | Admitting: Ophthalmology

## 2016-12-24 DIAGNOSIS — H43813 Vitreous degeneration, bilateral: Secondary | ICD-10-CM | POA: Diagnosis not present

## 2016-12-24 DIAGNOSIS — E113293 Type 2 diabetes mellitus with mild nonproliferative diabetic retinopathy without macular edema, bilateral: Secondary | ICD-10-CM | POA: Diagnosis not present

## 2016-12-24 DIAGNOSIS — E11319 Type 2 diabetes mellitus with unspecified diabetic retinopathy without macular edema: Secondary | ICD-10-CM | POA: Diagnosis not present

## 2016-12-24 DIAGNOSIS — I10 Essential (primary) hypertension: Secondary | ICD-10-CM | POA: Diagnosis not present

## 2016-12-24 DIAGNOSIS — H35033 Hypertensive retinopathy, bilateral: Secondary | ICD-10-CM | POA: Diagnosis not present

## 2016-12-24 MED FILL — RAMIPRIL 2.5 MG CAPSULE: 2.5 | 90 days supply | Qty: 45 | Fill #3

## 2016-12-24 MED FILL — LANTUS SOLOSTAR 100 UNITS/M: 100 | 90 days supply | Qty: 54 | Fill #1

## 2017-01-11 ENCOUNTER — Telehealth: Payer: Self-pay | Admitting: *Deleted

## 2017-01-11 NOTE — Telephone Encounter (Signed)
I have attempted to contact this patient by phone with the following results: unable to leave message-mailbox full. I will continue to try later.

## 2017-01-11 NOTE — Telephone Encounter (Signed)
-----   Message from Graylon Good, [place] sent at 10/21/2016 10:43 AM EDT ----- Regarding: vit D follow up Needs Vit D check 12 weeks from 10/21/16.

## 2017-01-14 ENCOUNTER — Ambulatory Visit
Admission: RE | Admit: 2017-01-14 | Discharge: 2017-01-14 | Disposition: A | Discharge status: Home / Self Care | Payer: 59 | Source: Ambulatory Visit | Attending: Internal Medicine | Admitting: Internal Medicine

## 2017-01-14 DIAGNOSIS — M5126 Other intervertebral disc displacement, lumbar region: Secondary | ICD-10-CM | POA: Diagnosis not present

## 2017-01-14 DIAGNOSIS — M545 Low back pain, unspecified: Secondary | ICD-10-CM

## 2017-01-28 DIAGNOSIS — Z6830 Body mass index (BMI) 30.0-30.9, adult: Secondary | ICD-10-CM | POA: Diagnosis not present

## 2017-01-28 DIAGNOSIS — M5416 Radiculopathy, lumbar region: Secondary | ICD-10-CM | POA: Diagnosis not present

## 2017-01-28 DIAGNOSIS — M4317 Spondylolisthesis, lumbosacral region: Secondary | ICD-10-CM | POA: Diagnosis not present

## 2017-01-28 DIAGNOSIS — M4727 Other spondylosis with radiculopathy, lumbosacral region: Secondary | ICD-10-CM | POA: Diagnosis not present

## 2017-01-28 MED FILL — DICLOFENAC SOD 75 MG TAB EC: 75 | 30 days supply | Qty: 60 | Fill #0

## 2017-01-28 MED FILL — GABAPENTIN 300 MG CAPS: 300 | 30 days supply | Qty: 90 | Fill #0

## 2017-02-01 ENCOUNTER — Other Ambulatory Visit: Payer: Self-pay | Admitting: Neurological Surgery

## 2017-02-01 DIAGNOSIS — M4727 Other spondylosis with radiculopathy, lumbosacral region: Secondary | ICD-10-CM

## 2017-02-10 ENCOUNTER — Ambulatory Visit
Admission: RE | Admit: 2017-02-10 | Discharge: 2017-02-10 | Disposition: A | Discharge status: Home / Self Care | Payer: 59 | Source: Ambulatory Visit | Attending: Neurological Surgery | Admitting: Neurological Surgery

## 2017-02-10 DIAGNOSIS — M5126 Other intervertebral disc displacement, lumbar region: Secondary | ICD-10-CM | POA: Diagnosis not present

## 2017-02-10 DIAGNOSIS — M4727 Other spondylosis with radiculopathy, lumbosacral region: Secondary | ICD-10-CM

## 2017-02-10 MED ORDER — METHYLPREDNISOLONE ACETATE 40 MG/ML INJ SUSP (RADIOLOG
120.0000 mg | Freq: Once | INTRAMUSCULAR | Status: AC
  Administered 2017-02-10: 120 mg via EPIDURAL

## 2017-02-10 MED ORDER — IOPAMIDOL (ISOVUE-M 200) INJECTION 41%
1.0000 mL | Freq: Once | INTRAMUSCULAR | Status: AC
  Administered 2017-02-10: 1 mL via EPIDURAL

## 2017-02-10 NOTE — Discharge Instructions (Signed)

## 2017-03-01 ENCOUNTER — Telehealth: Payer: Self-pay | Admitting: *Deleted

## 2017-03-01 NOTE — Telephone Encounter (Signed)
That is fine.  I will cancel the Vit D order.  Ok to close encounter.

## 2017-03-01 NOTE — Telephone Encounter (Signed)
-----   Message from Megan Salon, MD sent at 02/28/2017 12:17 AM EDT ----- Regarding: Vit D recheck Raquel Sarna, This is a former pt of [facility].  She has not returned for her Vit D recheck.  Her level was 11.  I would really like her to come back for recheck if possible.  Lab order has been placed.  Thanks.  Vinnie Level

## 2017-03-01 NOTE — Telephone Encounter (Signed)
Patient is returning a call to [place]. Offered patient a vit d reck appointment patient declined. Patient states she will call back later to schedule this appointment.

## 2017-03-01 NOTE — Telephone Encounter (Signed)
Routing to Dr. Miller as FYI

## 2017-03-01 NOTE — Telephone Encounter (Signed)
Message noted from Dr. Miller. Encounter closed. 

## 2017-03-01 NOTE — Telephone Encounter (Signed)
Message left to return call to Johnel Yielding at [phone].    

## 2017-03-03 MED FILL — VIT D2 1.25 MG (50,000 UNIT: 1.25 MG | 84 days supply | Qty: 24 | Fill #1

## 2017-03-03 MED FILL — SIMVASTATIN 40 MG TABLET: 40 | 90 days supply | Qty: 90 | Fill #2

## 2017-03-03 MED FILL — GLIMEPIRIDE 4 MG TABLET: 4 | 90 days supply | Qty: 90 | Fill #1

## 2017-03-03 MED FILL — INVOKANA 100 MG TABLET: 100 | 90 days supply | Qty: 90 | Fill #1

## 2017-03-03 MED FILL — DICLOFENAC SOD 75 MG TAB EC: 75 | 30 days supply | Qty: 60 | Fill #1

## 2017-03-04 NOTE — Telephone Encounter (Signed)
See next phone encounter for attempts to contact patient.   Routing to Dr Sabra Heck for final review. Will close encounter.

## 2017-03-25 MED FILL — LANTUS SOLOSTAR 100 UNITS/M: 100 | 90 days supply | Qty: 54 | Fill #2

## 2017-03-25 MED FILL — RAMIPRIL 2.5 MG CAPSULE: 2.5 | 90 days supply | Qty: 45 | Fill #4

## 2017-04-14 MED FILL — GABAPENTIN 300 MG CAPSULE: 300 | 30 days supply | Qty: 90 | Fill #1

## 2017-04-14 MED FILL — JANUVIA 100 MG TABLET: 100 | 90 days supply | Qty: 90 | Fill #0

## 2017-04-26 DIAGNOSIS — E1165 Type 2 diabetes mellitus with hyperglycemia: Secondary | ICD-10-CM | POA: Diagnosis not present

## 2017-04-26 DIAGNOSIS — E668 Other obesity: Secondary | ICD-10-CM | POA: Diagnosis not present

## 2017-04-26 DIAGNOSIS — Z794 Long term (current) use of insulin: Secondary | ICD-10-CM | POA: Diagnosis not present

## 2017-04-26 DIAGNOSIS — Z6831 Body mass index (BMI) 31.0-31.9, adult: Secondary | ICD-10-CM | POA: Diagnosis not present

## 2017-04-26 DIAGNOSIS — M48061 Spinal stenosis, lumbar region without neurogenic claudication: Secondary | ICD-10-CM | POA: Diagnosis not present

## 2017-05-03 MED FILL — DICLOFENAC SOD 75 MG TAB EC: 75 | 30 days supply | Qty: 60 | Fill #2

## 2017-05-10 MED FILL — ACCU-CHEK GUIDE TEST STRIP: 90 days supply | Qty: 200 | Fill #0

## 2017-05-31 DIAGNOSIS — H2513 Age-related nuclear cataract, bilateral: Secondary | ICD-10-CM | POA: Diagnosis not present

## 2017-05-31 DIAGNOSIS — H25042 Posterior subcapsular polar age-related cataract, left eye: Secondary | ICD-10-CM | POA: Diagnosis not present

## 2017-05-31 DIAGNOSIS — H25013 Cortical age-related cataract, bilateral: Secondary | ICD-10-CM | POA: Diagnosis not present

## 2017-05-31 DIAGNOSIS — E113293 Type 2 diabetes mellitus with mild nonproliferative diabetic retinopathy without macular edema, bilateral: Secondary | ICD-10-CM | POA: Diagnosis not present

## 2017-05-31 DIAGNOSIS — H524 Presbyopia: Secondary | ICD-10-CM | POA: Diagnosis not present

## 2017-05-31 DIAGNOSIS — H25012 Cortical age-related cataract, left eye: Secondary | ICD-10-CM | POA: Diagnosis not present

## 2017-05-31 DIAGNOSIS — H269 Unspecified cataract: Secondary | ICD-10-CM | POA: Diagnosis not present

## 2017-05-31 DIAGNOSIS — H2512 Age-related nuclear cataract, left eye: Secondary | ICD-10-CM | POA: Diagnosis not present

## 2017-06-01 ENCOUNTER — Other Ambulatory Visit: Payer: Self-pay

## 2017-06-01 NOTE — Patient Outreach (Signed)
[facility]) Care Management  06/01/2017  Laurrie Toppin 1956-05-25 [id number]   Case closed in [place].  Member is being followed by the [employer] program Member enrolled in an external program. Peter Garter RN, [facility] Care Management Coordinator-Link to [place] Management [phone]

## 2017-06-13 MED FILL — OFLOXACIN 0.3% EYE DROPS: 0.3 | 25 days supply | Qty: 10 | Fill #0

## 2017-06-13 MED FILL — KETOROLAC 0.5% OPHTH SOLN: 0.5 | 25 days supply | Qty: 5 | Fill #0

## 2017-06-13 MED FILL — PREDNISOLONE AC 1% EYE DROP: 1 | 25 days supply | Qty: 5 | Fill #0

## 2017-06-14 MED FILL — INVOKANA 100 MG TABLET: 100 | 90 days supply | Qty: 90 | Fill #2

## 2017-06-14 MED FILL — GLIMEPIRIDE 4 MG TABLET: 4 | 90 days supply | Qty: 90 | Fill #2

## 2017-06-28 HISTORY — PX: CATARACT EXTRACTION: SUR2

## 2017-06-29 DIAGNOSIS — H25812 Combined forms of age-related cataract, left eye: Secondary | ICD-10-CM | POA: Diagnosis not present

## 2017-06-29 DIAGNOSIS — H2522 Age-related cataract, morgagnian type, left eye: Secondary | ICD-10-CM | POA: Diagnosis not present

## 2017-06-29 DIAGNOSIS — H2589 Other age-related cataract: Secondary | ICD-10-CM | POA: Diagnosis not present

## 2017-07-01 ENCOUNTER — Ambulatory Visit (INDEPENDENT_AMBULATORY_CARE_PROVIDER_SITE_OTHER): Payer: 59 | Admitting: Ophthalmology

## 2017-07-07 MED FILL — LANTUS SOLOSTAR 100 UNITS/M: 100 | 90 days supply | Qty: 54 | Fill #3

## 2017-07-07 MED FILL — SIMVASTATIN 40 MG TABLET: 40 | 90 days supply | Qty: 90 | Fill #0

## 2017-07-26 MED FILL — RAMIPRIL 2.5 MG CAPSULE: 2.5 | 90 days supply | Qty: 45 | Fill #0

## 2017-07-26 MED FILL — JANUVIA 100 MG TABLET: 100 | 90 days supply | Qty: 90 | Fill #1

## 2017-08-04 MED FILL — GABAPENTIN 300 MG CAPSULE: 300 | 30 days supply | Qty: 90 | Fill #2

## 2017-08-19 DIAGNOSIS — Z0000 Encounter for general adult medical examination without abnormal findings: Secondary | ICD-10-CM | POA: Diagnosis not present

## 2017-08-19 DIAGNOSIS — E1165 Type 2 diabetes mellitus with hyperglycemia: Secondary | ICD-10-CM | POA: Diagnosis not present

## 2017-08-19 DIAGNOSIS — R82998 Other abnormal findings in urine: Secondary | ICD-10-CM | POA: Diagnosis not present

## 2017-08-19 DIAGNOSIS — E559 Vitamin D deficiency, unspecified: Secondary | ICD-10-CM | POA: Diagnosis not present

## 2017-08-26 DIAGNOSIS — Z0000 Encounter for general adult medical examination without abnormal findings: Secondary | ICD-10-CM | POA: Diagnosis not present

## 2017-08-26 DIAGNOSIS — R5383 Other fatigue: Secondary | ICD-10-CM | POA: Diagnosis not present

## 2017-08-26 DIAGNOSIS — E1165 Type 2 diabetes mellitus with hyperglycemia: Secondary | ICD-10-CM | POA: Diagnosis not present

## 2017-08-26 DIAGNOSIS — E11319 Type 2 diabetes mellitus with unspecified diabetic retinopathy without macular edema: Secondary | ICD-10-CM | POA: Diagnosis not present

## 2017-08-26 DIAGNOSIS — Z794 Long term (current) use of insulin: Secondary | ICD-10-CM | POA: Diagnosis not present

## 2017-08-26 DIAGNOSIS — Z1389 Encounter for screening for other disorder: Secondary | ICD-10-CM | POA: Diagnosis not present

## 2017-08-26 DIAGNOSIS — R0609 Other forms of dyspnea: Secondary | ICD-10-CM | POA: Diagnosis not present

## 2017-08-26 DIAGNOSIS — M48061 Spinal stenosis, lumbar region without neurogenic claudication: Secondary | ICD-10-CM | POA: Diagnosis not present

## 2017-08-26 DIAGNOSIS — F418 Other specified anxiety disorders: Secondary | ICD-10-CM | POA: Diagnosis not present

## 2017-08-26 DIAGNOSIS — R51 Headache: Secondary | ICD-10-CM | POA: Diagnosis not present

## 2017-08-26 DIAGNOSIS — Z23 Encounter for immunization: Secondary | ICD-10-CM | POA: Diagnosis not present

## 2017-08-26 MED FILL — DICLOFENAC SOD EC 50 MG TAB: 50 | 30 days supply | Qty: 60 | Fill #0

## 2017-08-26 MED FILL — ROSUVASTATIN CALCIUM 20 MG: 20 | 90 days supply | Qty: 90 | Fill #0

## 2017-08-26 MED FILL — VIT D2 1.25 MG (50,000 UNIT: 1.25 MG | 84 days supply | Qty: 12 | Fill #0

## 2017-08-30 ENCOUNTER — Telehealth: Payer: Self-pay

## 2017-08-30 NOTE — Telephone Encounter (Signed)
NT REFERRAL TO SCHEDULING FROM DR Jenny Reichmann RUSSO PH# [phone]

## 2017-09-26 MED FILL — INVOKANA 100 MG TABLET: 100 | 90 days supply | Qty: 90 | Fill #0

## 2017-09-26 MED FILL — LANTUS SOLOSTAR 100 UNITS/M: 100 | 90 days supply | Qty: 54 | Fill #4

## 2017-09-26 MED FILL — GLIMEPIRIDE 4 MG TABLET: 4 | 90 days supply | Qty: 90 | Fill #0

## 2017-10-28 ENCOUNTER — Ambulatory Visit: Payer: 59 | Admitting: Podiatry

## 2017-10-28 ENCOUNTER — Ambulatory Visit (INDEPENDENT_AMBULATORY_CARE_PROVIDER_SITE_OTHER): Payer: 59

## 2017-10-28 ENCOUNTER — Encounter: Payer: Self-pay | Admitting: Podiatry

## 2017-10-28 DIAGNOSIS — B351 Tinea unguium: Secondary | ICD-10-CM | POA: Diagnosis not present

## 2017-10-28 DIAGNOSIS — Z79899 Other long term (current) drug therapy: Secondary | ICD-10-CM | POA: Diagnosis not present

## 2017-10-28 DIAGNOSIS — M79676 Pain in unspecified toe(s): Secondary | ICD-10-CM | POA: Diagnosis not present

## 2017-10-28 DIAGNOSIS — M779 Enthesopathy, unspecified: Secondary | ICD-10-CM

## 2017-10-28 DIAGNOSIS — M722 Plantar fascial fibromatosis: Secondary | ICD-10-CM | POA: Diagnosis not present

## 2017-10-28 MED ORDER — MELOXICAM 15 MG PO TABS: 15.0000 mg | ORAL_TABLET | Freq: Every day | ORAL | 0 refills | Status: DC

## 2017-10-28 MED FILL — MELOXICAM 15 MG TABLET: 15 | 30 days supply | Qty: 30 | Fill #0

## 2017-10-28 NOTE — Patient Instructions (Signed)
Plantar Fasciitis (Heel Spur Syndrome) with Rehab The plantar fascia is a fibrous, ligament-like, soft-tissue structure that spans the bottom of the foot. Plantar fasciitis is a condition that causes pain in the foot due to inflammation of the tissue. SYMPTOMS   Pain and tenderness on the underneath side of the foot.  Pain that worsens with standing or walking. CAUSES  Plantar fasciitis is caused by irritation and injury to the plantar fascia on the underneath side of the foot. Common mechanisms of injury include:  Direct trauma to bottom of the foot.  Damage to a small nerve that runs under the foot where the main fascia attaches to the heel bone.  Stress placed on the plantar fascia due to bone spurs. RISK INCREASES WITH:   Activities that place stress on the plantar fascia (running, jumping, pivoting, or cutting).  Poor strength and flexibility.  Improperly fitted shoes.  Tight calf muscles.  Flat feet.  Failure to warm-up properly before activity.  Obesity. PREVENTION  Warm up and stretch properly before activity.  Allow for adequate recovery between workouts.  Maintain physical fitness:  Strength, flexibility, and endurance.  Cardiovascular fitness.  Maintain a health body weight.  Avoid stress on the plantar fascia.  Wear properly fitted shoes, including arch supports for individuals who have flat feet.  PROGNOSIS  If treated properly, then the symptoms of plantar fasciitis usually resolve without surgery. However, occasionally surgery is necessary.  RELATED COMPLICATIONS   Recurrent symptoms that may result in a chronic condition.  Problems of the lower back that are caused by compensating for the injury, such as limping.  Pain or weakness of the foot during push-off following surgery.  Chronic inflammation, scarring, and partial or complete fascia tear, occurring more often from repeated injections.  TREATMENT  Treatment initially involves the  use of ice and medication to help reduce pain and inflammation. The use of strengthening and stretching exercises may help reduce pain with activity, especially stretches of the Achilles tendon. These exercises may be performed at home or with a therapist. Your caregiver may recommend that you use heel cups of arch supports to help reduce stress on the plantar fascia. Occasionally, corticosteroid injections are given to reduce inflammation. If symptoms persist for greater than 6 months despite non-surgical (conservative), then surgery may be recommended.   MEDICATION   If pain medication is necessary, then nonsteroidal anti-inflammatory medications, such as aspirin and ibuprofen, or other minor pain relievers, such as acetaminophen, are often recommended.  Do not take pain medication within 7 days before surgery.  Prescription pain relievers may be given if deemed necessary by your caregiver. Use only as directed and only as much as you need.  Corticosteroid injections may be given by your caregiver. These injections should be reserved for the most serious cases, because they may only be given a certain number of times.  HEAT AND COLD  Cold treatment (icing) relieves pain and reduces inflammation. Cold treatment should be applied for 10 to 15 minutes every 2 to 3 hours for inflammation and pain and immediately after any activity that aggravates your symptoms. Use ice packs or massage the area with a piece of ice (ice massage).  Heat treatment may be used prior to performing the stretching and strengthening activities prescribed by your caregiver, physical therapist, or athletic trainer. Use a heat pack or soak the injury in warm water.  SEEK IMMEDIATE MEDICAL CARE IF:  Treatment seems to offer no benefit, or the condition worsens.  Any medications   produce adverse side effects.  EXERCISES- RANGE OF MOTION (ROM) AND STRETCHING EXERCISES - Plantar Fasciitis (Heel Spur Syndrome) These exercises  may help you when beginning to rehabilitate your injury. Your symptoms may resolve with or without further involvement from your physician, physical therapist or athletic trainer. While completing these exercises, remember:   Restoring tissue flexibility helps normal motion to return to the joints. This allows healthier, less painful movement and activity.  An effective stretch should be held for at least 30 seconds.  A stretch should never be painful. You should only feel a gentle lengthening or release in the stretched tissue.  RANGE OF MOTION - Toe Extension, Flexion  Sit with your right / left leg crossed over your opposite knee.  Grasp your toes and gently pull them back toward the top of your foot. You should feel a stretch on the bottom of your toes and/or foot.  Hold this stretch for 10 seconds.  Now, gently pull your toes toward the bottom of your foot. You should feel a stretch on the top of your toes and or foot.  Hold this stretch for 10 seconds. Repeat  times. Complete this stretch 3 times per day.   RANGE OF MOTION - Ankle Dorsiflexion, Active Assisted  Remove shoes and sit on a chair that is preferably not on a carpeted surface.  Place right / left foot under knee. Extend your opposite leg for support.  Keeping your heel down, slide your right / left foot back toward the chair until you feel a stretch at your ankle or calf. If you do not feel a stretch, slide your bottom forward to the edge of the chair, while still keeping your heel down.  Hold this stretch for 10 seconds. Repeat 3 times. Complete this stretch 2 times per day.   STRETCH  Gastroc, Standing  Place hands on wall.  Extend right / left leg, keeping the front knee somewhat bent.  Slightly point your toes inward on your back foot.  Keeping your right / left heel on the floor and your knee straight, shift your weight toward the wall, not allowing your back to arch.  You should feel a gentle stretch  in the right / left calf. Hold this position for 10 seconds. Repeat 3 times. Complete this stretch 2 times per day.  STRETCH  Soleus, Standing  Place hands on wall.  Extend right / left leg, keeping the other knee somewhat bent.  Slightly point your toes inward on your back foot.  Keep your right / left heel on the floor, bend your back knee, and slightly shift your weight over the back leg so that you feel a gentle stretch deep in your back calf.  Hold this position for 10 seconds. Repeat 3 times. Complete this stretch 2 times per day.  STRETCH  Gastrocsoleus, Standing  Note: This exercise can place a lot of stress on your foot and ankle. Please complete this exercise only if specifically instructed by your caregiver.   Place the ball of your right / left foot on a step, keeping your other foot firmly on the same step.  Hold on to the wall or a rail for balance.  Slowly lift your other foot, allowing your body weight to press your heel down over the edge of the step.  You should feel a stretch in your right / left calf.  Hold this position for 10 seconds.  Repeat this exercise with a slight bend in your right /   left knee. Repeat 3 times. Complete this stretch 2 times per day.   STRENGTHENING EXERCISES - Plantar Fasciitis (Heel Spur Syndrome)  These exercises may help you when beginning to rehabilitate your injury. They may resolve your symptoms with or without further involvement from your physician, physical therapist or athletic trainer. While completing these exercises, remember:   Muscles can gain both the endurance and the strength needed for everyday activities through controlled exercises.  Complete these exercises as instructed by your physician, physical therapist or athletic trainer. Progress the resistance and repetitions only as guided.  STRENGTH - Towel Curls  Sit in a chair positioned on a non-carpeted surface.  Place your foot on a towel, keeping your heel  on the floor.  Pull the towel toward your heel by only curling your toes. Keep your heel on the floor. Repeat 3 times. Complete this exercise 2 times per day.  STRENGTH - Ankle Inversion  Secure one end of a rubber exercise band/tubing to a fixed object (table, pole). Loop the other end around your foot just before your toes.  Place your fists between your knees. This will focus your strengthening at your ankle.  Slowly, pull your big toe up and in, making sure the band/tubing is positioned to resist the entire motion.  Hold this position for 10 seconds.  Have your muscles resist the band/tubing as it slowly pulls your foot back to the starting position. Repeat 3 times. Complete this exercises 2 times per day.  Document Released: 06/14/2005 Document Revised: 09/06/2011 Document Reviewed: 09/26/2008 [facility] Patient Information 2014 [place], [place].   Terbinafine oral granules What is this medicine? TERBINAFINE (TER bin a feen) is an antifungal medicine. It is used to treat certain kinds of fungal or yeast infections. This medicine may be used for other purposes; ask your health care provider or pharmacist if you have questions. COMMON BRAND NAME(S): Lamisil What should I tell my health care provider before I take this medicine? They need to know if you have any of these conditions: -drink alcoholic beverages -kidney disease -liver disease -an unusual or allergic reaction to Terbinafine, other medicines, foods, dyes, or preservatives -pregnant or trying to get pregnant -breast-feeding How should I use this medicine? Take this medicine by mouth. Follow the directions on the prescription label. Hold packet with cut line on top. Shake packet gently to settle contents. Tear packet open along cut line, or use scissors to cut across line. Carefully pour the entire contents of packet onto a spoonful of a soft food, such as pudding or other soft, non-acidic food such as mashed potatoes (do  NOT use applesauce or a fruit-based food). If two packets are required for each dose, you may either sprinkle the content of both packets on one spoonful of non-acidic food, or sprinkle the contents of both packets on two spoonfuls of non-acidic food. Make sure that no granules remain in the packet. Swallow the mxiture of the food and granules without chewing. Take your medicine at regular intervals. Do not take it more often than directed. Take all of your medicine as directed even if you think you are better. Do not skip doses or stop your medicine early. Contact your pediatrician or health care professional regarding the use of this medicine in children. While this medicine may be prescribed for children as young as 4 years for selected conditions, precautions do apply. Overdosage: If you think you have taken too much of this medicine contact a poison control center or emergency room  at once. NOTE: This medicine is only for you. Do not share this medicine with others. What if I miss a dose? If you miss a dose, take it as soon as you can. If it is almost time for your next dose, take only that dose. Do not take double or extra doses. What may interact with this medicine? Do not take this medicine with any of the following medications: -thioridazine This medicine may also interact with the following medications: -beta-blockers -caffeine -cimetidine -cyclosporine -MAOIs like Carbex, Eldepryl, Marplan, Nardil, and Parnate -medicines for fungal infections like fluconazole and ketoconazole -medicines for irregular heartbeat like amiodarone, flecainide and propafenone -rifampin -SSRIs like citalopram, escitalopram, fluoxetine, fluvoxamine, paroxetine and sertraline -tricyclic antidepressants like amitriptyline, clomipramine, desipramine, imipramine, nortriptyline, and others -warfarin This list may not describe all possible interactions. Give your health care provider a list of all the medicines,  herbs, non-prescription drugs, or dietary supplements you use. Also tell them if you smoke, drink alcohol, or use illegal drugs. Some items may interact with your medicine. What should I watch for while using this medicine? Your doctor may monitor your liver function. Tell your doctor right away if you have nausea or vomiting, loss of appetite, stomach pain on your right upper side, yellow skin, dark urine, light stools, or are over tired. You need to take this medicine for 6 weeks or longer to cure the fungal infection. Take your medicine regularly for as long as your doctor or health care professional tells you to. What side effects may I notice from receiving this medicine? Side effects that you should report to your doctor or health care professional as soon as possible: -allergic reactions like skin rash or hives, swelling of the face, lips, or tongue -change in vision -dark urine -fever or infection -general ill feeling or flu-like symptoms -light-colored stools -loss of appetite, nausea -redness, blistering, peeling or loosening of the skin, including inside the mouth -right upper belly pain -unusually weak or tired -yellowing of the eyes or skin Side effects that usually do not require medical attention (report to your doctor or health care professional if they continue or are bothersome): -changes in taste -diarrhea -hair loss -muscle or joint pain -stomach upset This list may not describe all possible side effects. Call your doctor for medical advice about side effects. You may report side effects to FDA at 1-800-FDA-1088. Where should I keep my medicine? Keep out of the reach of children. Store at room temperature between 15 and 30 degrees C (59 and 86 degrees F). Throw away any unused medicine after the expiration date. NOTE: This sheet is a summary. It may not cover all possible information. If you have questions about this medicine, talk to your doctor, pharmacist, or health  care provider.  2018 Elsevier/Gold Standard (2007-08-25 17:25:48)

## 2017-10-30 NOTE — Progress Notes (Signed)
Subjective: Rachel Gaines presents the office today for concerns of recurrent right heel pain as well as pain in the top of her left foot.  She states this been ongoing for some time.  She denies any recent injury or trauma.  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  Denies any increase in swelling denies any recent redness to the areas.  She also states her nails are thick and elongated mostly her big toes and second toes on both feet.  They do cause irritation with shoes.  Denies any redness or drainage or any swelling to the toenail sites.Denies any systemic complaints such as fevers, chills, nausea, vomiting. No acute changes since last appointment, and no other complaints at this time.   Objective: AAO x3, NAD DP/PT pulses palpable bilaterally, CRT less than 3 seconds Tenderness to palpation along the plantar medial tubercle of the calcaneus at the insertion of plantar fascia on the right foot. There is no pain along the course of the plantar fascia within the arch of the foot. Plantar fascia appears to be intact. There is no pain with lateral compression of the calcaneus or pain with vibratory sensation. There is no pain along the course or insertion of the achilles tendon.  There is tenderness the dorsal aspect the left foot along the Lisfranc joint is a prominent dorsal spurring present.  No other areas of tenderness to bilateral lower extremities. Bilateral hallux as well as second digit toenails are hypertrophic, dystrophic, discolored with ill-defined discoloration most of the hallux toenails are mostly hypertrophic.  There is no surrounding redness or drainage or any clinical signs of infection. No open lesions or pre-ulcerative lesions.  No pain with calf compression, swelling, warmth, erythema  Assessment: Right foot plantar fasciitis; left foot capsulitis, osteoarthritis; symptomatic onychomycosis  Plan: -All treatment  options discussed with the patient including all alternatives, risks, complications.  -X-rays were obtained reviewed.  No evidence of acute fracture or stress pressure.  Inferior calcaneal spurring present.  Arthritic changes present in the midfoot on the left side. -Steroid injection performed on the right foot.  See procedure note below.  Continue with stretching, icing exercises daily right foot.  Continue plantar fascial brace was dispensed to her today as well.  On the also discussed orthotics left foot continue with supportive shoe discussed a stiffer soled shoe. -Prescribed mobic. Discussed side effects of the medication and directed to stop if any are to occur and call the office.  -Debrided nails x4 without any complications or bleeding.  Treatment for the fungus.  We discussed Lamisil as well as other options and she like to proceed with oral Lamisil.  Discussed this is not a guarantee resolution of symptoms we discussed side effects.  We will check a CBC and LFT prior to starting the medication. -Patient encouraged to call the office with any questions, concerns, change in symptoms.   Procedure: Injection Tendon/Ligament Discussed alternatives, risks, complications and verbal consent was obtained.  Location: Right plantar fascia at the glabrous junction; medial approach. Skin Prep: Alcohol. Injectate: 0.5cc 0.5% marcaine plain, 0.5 cc 2% lidocaine plain and, 1 cc kenalog 10. Disposition: Patient tolerated procedure well. Injection site dressed with a band-aid.  Post-injection care was discussed and return precautions discussed.    Trula Slade DPM

## 2017-10-31 MED FILL — JANUVIA 100 MG TABLET: 100 | 90 days supply | Qty: 90 | Fill #0

## 2017-11-04 DIAGNOSIS — Z79899 Other long term (current) drug therapy: Secondary | ICD-10-CM | POA: Diagnosis not present

## 2017-11-04 LAB — HEPATIC FUNCTION PANEL
AG Ratio: 1.4 (calc) (ref 1.0–2.5)
ALT: 15 U/L (ref 6–29)
AST: 14 U/L (ref 10–35)
Albumin: 4.1 g/dL (ref 3.6–5.1)
Alkaline phosphatase (APISO): 85 U/L (ref 33–130)
Bilirubin, Direct: 0.2 mg/dL (ref 0.0–0.2)
Globulin: 3 g/dL (calc) (ref 1.9–3.7)
Indirect Bilirubin: 0.8 mg/dL (calc) (ref 0.2–1.2)
Total Bilirubin: 1 mg/dL (ref 0.2–1.2)
Total Protein: 7.1 g/dL (ref 6.1–8.1)

## 2017-11-04 LAB — CBC WITH DIFFERENTIAL/PLATELET
Basophils Absolute: 41 cells/uL (ref 0–200)
Basophils Relative: 0.7 %
Eosinophils Absolute: 128 cells/uL (ref 15–500)
Eosinophils Relative: 2.2 %
HCT: 43.8 % (ref 35.0–45.0)
Hemoglobin: 14.9 g/dL (ref 11.7–15.5)
Lymphs Abs: 2146 cells/uL (ref 850–3900)
MCH: 28.1 pg (ref 27.0–33.0)
MCHC: 34 g/dL (ref 32.0–36.0)
MCV: 82.6 fL (ref 80.0–100.0)
MPV: 9.9 fL (ref 7.5–12.5)
Monocytes Relative: 6.3 %
Neutro Abs: 3120 cells/uL (ref 1500–7800)
Neutrophils Relative %: 53.8 %
Platelets: 259 10*3/uL (ref 140–400)
RBC: 5.3 10*6/uL — ABNORMAL HIGH (ref 3.80–5.10)
RDW: 13.9 % (ref 11.0–15.0)
Total Lymphocyte: 37 %
WBC mixed population: 365 cells/uL (ref 200–950)
WBC: 5.8 10*3/uL (ref 3.8–10.8)

## 2017-11-10 ENCOUNTER — Telehealth: Payer: Self-pay | Admitting: *Deleted

## 2017-11-10 MED ORDER — TERBINAFINE HCL 250 MG PO TABS: 250.0000 mg | ORAL_TABLET | Freq: Every day | ORAL | 0 refills | Status: DC

## 2017-11-10 MED FILL — TERBINAFINE HCL 250 MG TABS: 250 | 90 days supply | Qty: 90 | Fill #0

## 2017-11-10 NOTE — Telephone Encounter (Signed)
I informed pt of Dr. Leigh Aurora review of results and orders. Pt states understanding and will check her schedule and call again for 6 week appt.

## 2017-11-10 NOTE — Telephone Encounter (Signed)
-----   Message from Trula Slade, DPM sent at 11/09/2017  5:09 PM EDT ----- Val- please let her know that the labs are normral and she can start Lamisil. Please order 90 days and have her follow-up in 6 weeks. Thanks.

## 2017-11-28 MED FILL — DICLOFENAC SOD EC 50 MG TAB: 50 | 30 days supply | Qty: 60 | Fill #1

## 2017-11-28 MED FILL — RAMIPRIL 2.5 MG CAPSULE: 2.5 | 90 days supply | Qty: 45 | Fill #1

## 2017-12-09 DIAGNOSIS — G6289 Other specified polyneuropathies: Secondary | ICD-10-CM | POA: Diagnosis not present

## 2017-12-09 DIAGNOSIS — E7849 Other hyperlipidemia: Secondary | ICD-10-CM | POA: Diagnosis not present

## 2017-12-09 DIAGNOSIS — Z794 Long term (current) use of insulin: Secondary | ICD-10-CM | POA: Diagnosis not present

## 2017-12-09 DIAGNOSIS — E668 Other obesity: Secondary | ICD-10-CM | POA: Diagnosis not present

## 2017-12-09 DIAGNOSIS — E1165 Type 2 diabetes mellitus with hyperglycemia: Secondary | ICD-10-CM | POA: Diagnosis not present

## 2017-12-09 DIAGNOSIS — E11319 Type 2 diabetes mellitus with unspecified diabetic retinopathy without macular edema: Secondary | ICD-10-CM | POA: Diagnosis not present

## 2017-12-09 DIAGNOSIS — M48061 Spinal stenosis, lumbar region without neurogenic claudication: Secondary | ICD-10-CM | POA: Diagnosis not present

## 2017-12-09 DIAGNOSIS — Z23 Encounter for immunization: Secondary | ICD-10-CM | POA: Diagnosis not present

## 2017-12-09 DIAGNOSIS — R0609 Other forms of dyspnea: Secondary | ICD-10-CM | POA: Diagnosis not present

## 2017-12-09 MED FILL — GABAPENTIN 300 MG CAPSULE: 300 | 90 days supply | Qty: 270 | Fill #0

## 2017-12-12 MED FILL — INVOKANA 100 MG TABLET: 100 | 90 days supply | Qty: 90 | Fill #1

## 2017-12-12 MED FILL — ROSUVASTATIN CALCIUM 20 MG: 20 | 90 days supply | Qty: 90 | Fill #1

## 2017-12-26 MED FILL — LANTUS SOLOSTAR 100 UNITS/M: 100 | 90 days supply | Qty: 54 | Fill #0

## 2018-01-06 ENCOUNTER — Encounter: Payer: Self-pay | Admitting: Obstetrics & Gynecology

## 2018-01-06 ENCOUNTER — Ambulatory Visit (INDEPENDENT_AMBULATORY_CARE_PROVIDER_SITE_OTHER): Payer: 59 | Admitting: Obstetrics & Gynecology

## 2018-01-06 ENCOUNTER — Other Ambulatory Visit (HOSPITAL_COMMUNITY)
Admission: RE | Admit: 2018-01-06 | Discharge: 2018-01-06 | Disposition: A | Discharge status: Home / Self Care | Payer: 59 | Source: Ambulatory Visit | Attending: Obstetrics & Gynecology | Admitting: Obstetrics & Gynecology

## 2018-01-06 VITALS — BP 118/64 | HR 84 | Resp 16 | Ht 64.0 in | Wt 175.4 lb

## 2018-01-06 DIAGNOSIS — E2839 Other primary ovarian failure: Secondary | ICD-10-CM

## 2018-01-06 DIAGNOSIS — Z01419 Encounter for gynecological examination (general) (routine) without abnormal findings: Secondary | ICD-10-CM

## 2018-01-06 DIAGNOSIS — Z124 Encounter for screening for malignant neoplasm of cervix: Secondary | ICD-10-CM | POA: Diagnosis not present

## 2018-01-06 DIAGNOSIS — Z1211 Encounter for screening for malignant neoplasm of colon: Secondary | ICD-10-CM | POA: Diagnosis not present

## 2018-01-06 DIAGNOSIS — R109 Unspecified abdominal pain: Secondary | ICD-10-CM | POA: Diagnosis not present

## 2018-01-06 MED ORDER — ROSUVASTATIN CALCIUM 20 MG PO TABS: 20.0000 mg | ORAL_TABLET | Freq: Every day | ORAL | Status: DC

## 2018-01-06 NOTE — Progress Notes (Signed)
Patient scheduled while in office for abdominal US on 01/20/18 at 8:45am, arriving at 8:25am. [facility], [address] location. NPO after midnight. Patient verbalizes understanding and is agreeable.

## 2018-01-06 NOTE — Addendum Note (Signed)
Addended by: Burnice Logan on: 01/06/2018 02:14 PM   Modules accepted: Orders

## 2018-01-06 NOTE — Progress Notes (Signed)
62 y.o. [id number] SingleAfrican AmericanF here for annual exam.  Doing well.  Denies vaginal bleeding.    Having epigastric pain that is worse with eating.  Has been going on for several months.   PCP:  Dr. Virgina Jock.  Last HbA1C 8.4.  Goal is around 7.0.  Had been 10 in February.    Patient's last menstrual period was 06/28/2006 (approximate).          Sexually active: No.  The current method of family planning is post menopausal status.    Exercising: No.   Smoker:  no  Health Maintenance: Pap:  04/11/15 Neg. HR HPV:neg   12/21/12 ASCUS. HR HPV:neg  History of abnormal Pap:  yes MMG:  10/15/16 BIRADS1:neg  Colonoscopy:  08/19/04 Normal. F/u 10 years  BMD:   04/2007 TDaP:  07/2017 Pneumonia vaccine(s):  20Hep C testing: 10/15/16 neg 16 Shingrix:   07/2017 Screening Labs: done 10/2017   reports that she has never smoked. She has never used smokeless tobacco. She reports that she does not drink alcohol or use drugs.  Past Medical History:  Diagnosis Date  . Abnormal Pap smear of cervix 2014   ASCUS with negative HR HPV   . Anemia   . Cataract   . Diabetes mellitus without complication ([place])   . Hyperlipidemia    on medicine  . Plantar fasciitis   . 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 CATARACT EXTRACTION  2019  . COLONOSCOPY    . COLPOSCOPY W/ BIOPSY / CURETTAGE  02/04/1999   Chronic cervicitis    Current Outpatient Medications  Medication Sig Dispense Refill  . albuterol (PROVENTIL HFA;VENTOLIN HFA) 108 (90 BASE) MCG/ACT inhaler Inhale into the lungs every 6 (six) hours as needed for wheezing or shortness of breath.    Marland Kitchen aspirin EC 81 MG tablet Take 81 mg by mouth daily.    Marland Kitchen b complex vitamins tablet Take 1 tablet by mouth daily.    . Biotin (BIOTIN 5000) 5 MG CAPS Take 1 capsule by mouth daily.    . bisacodyl (DULCOLAX) 5 MG EC tablet Take 5 mg by mouth once. 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 canagliflozin (INVOKANA) 100  MG TABS tablet Take 100 mg by mouth.    Marland Kitchen CINNAMON PO Take by mouth. Reported on 09/19/2015    . Dermatological Products, Misc. (NUVAIL) SOLN Apply 1 drop topically daily. 1 Bottle 11  . gabapentin (NEURONTIN) 300 MG capsule Take 300 mg by mouth 3 (three) times daily. Only takes at night    . glimepiride (AMARYL) 4 MG tablet Take 4 mg by mouth daily with breakfast.    . LANTUS SOLOSTAR 100 UNIT/ML Solostar Pen Inject 32 Units into the skin 2 (two) times daily. If CBG <80 in AM, take only 20 units.   1  . Magnesium 250 MG TABS Take 250 mg by mouth daily. Reported on 09/19/2015    . meloxicam (MOBIC) 15 MG tablet Take 1 tablet (15 mg total) by mouth daily. 30 tablet 0  . Multiple Vitamin (MULTIVITAMIN) tablet Take 1 tablet by mouth daily. Reported on 09/19/2015    . NONFORMULARY OR COMPOUNDED ITEM [facility]:  Antiinflammatory cream - Diclofenac 3%, Baclofen 2%, Cyclobenzaprine 2%, Lidocaine 2%, dispense 120grams, apply 1-2 grams to affected area 3-4 times a day, +2refills. 120 each 2  . ramipril (ALTACE) 2.5 MG capsule Take 2.5 mg by mouth every other  day.     . simvastatin (ZOCOR) 40 MG tablet Take 40 mg by mouth daily.    . sitaGLIPtin (JANUVIA) 100 MG tablet Take 100 mg by mouth daily.    Marland Kitchen terbinafine (LAMISIL) 250 MG tablet Take 1 tablet (250 mg total) by mouth daily. 90 tablet 0  . valACYclovir (VALTREX) 1000 MG tablet Take 1/2 daily and then 1/2 tablet twice a day for 3-5 days for flare 90 tablet 3  . Vitamin D, Ergocalciferol, (DRISDOL) 50000 units CAPS capsule Take 1 capsule (50,000 Units total) by mouth every 7 (seven) days. 30 capsule 1   No current facility-administered medications for this visit.     Family History  Problem Relation Age of Onset  . Diabetes Mother   . Diabetes Father   . Heart disease Father   . Cancer Father        Prostate  . Heart failure Father   . Heart disease Sister   . Diabetes Sister   . Diabetes Brother   . Lupus Daughter   . Cancer Brother         lung cancer  . Diabetes Brother   . Diabetes Sister   . Heart disease Sister   . Heart failure Sister   . Thyroid disease Sister   . Diabetes Sister   . Diabetes Sister   . Diabetes Sister   . Diabetes Sister   . Diabetes Sister   . Diabetes Sister   . Colon cancer Neg Hx   . Colon polyps Neg Hx   . Rectal cancer Neg Hx   . Stomach cancer Neg Hx   . Esophageal cancer Neg Hx     Review of Systems  Constitutional: Negative.   HENT: Negative.   Eyes: Negative.   Respiratory: Negative.   Cardiovascular: Negative.   Gastrointestinal:       Stomach pain  Genitourinary: Negative.   Musculoskeletal: Negative.   Skin: Negative.   Neurological: Negative.   Endo/Heme/Allergies: Negative.   Psychiatric/Behavioral: Negative.   All other systems reviewed and are negative.   Exam:   BP 118/64   Pulse 84   Resp 16   Ht 5\' 4"  (1.626 m)   Wt 175 lb 6.4 oz (79.6 kg)   LMP 06/28/2006 (Approximate)   BMI 30.11 kg/m   Height:   Height: 5\' 4"  (162.6 cm)  Ht Readings from Last 3 Encounters:  01/06/18 5\' 4"  (1.626 m)  10/15/16 5' 3.5" (1.613 m)  09/19/15 5\' 3"  (1.6 m)    General appearance: alert, cooperative and appears stated age Head: Normocephalic, without obvious abnormality, atraumatic Neck: no adenopathy, supple, symmetrical, trachea midline and thyroid normal to inspection and palpation Lungs: clear to auscultation bilaterally Breasts: normal appearance, no masses or tenderness Heart: regular rate and rhythm Abdomen: soft, non-tender; bowel sounds normal; no masses,  no organomegaly Extremities: extremities normal, atraumatic, no cyanosis or edema Skin: Skin color, texture, turgor normal. No rashes or lesions Lymph nodes: Cervical, supraclavicular, and axillary nodes normal. No abnormal inguinal nodes palpated Neurologic: Grossly normal   Pelvic: External genitalia:  no lesions              Urethra:  normal appearing urethra with no masses, tenderness or  lesions              Bartholins and Skenes: normal                 Vagina: normal appearing vagina with normal color and discharge, no lesions  Cervix: no lesions              Pap taken: Yes.   Bimanual Exam:  Uterus:  normal size, contour, position, consistency, mobility, non-tender              Adnexa: normal adnexa and no mass, fullness, tenderness               Rectovaginal: Confirms               Anus:  normal sphincter tone, no lesions  Chaperone was present for exam.  A:  Well Woman with normal exam PMP, no HRT Diabetes Elevated lipids H/o HSV, no recent outbreaks H/O ASCUS pap 2014 with neg HR HPV Epigastric pain  P:   Mammogram guidelines reviewed.  Will schedule with BMD for pt Abd ultrasound is needed pap smear with HR HPV obtained today Lab work is done with Dr. Virgina Jock. Vaccines UTD and she is in the process of finishing the Shingrix vaccination Referral to Dr. Collene Mares for colonoscopy Return annually or prn

## 2018-01-09 ENCOUNTER — Telehealth: Payer: Self-pay | Admitting: *Deleted

## 2018-01-09 ENCOUNTER — Other Ambulatory Visit: Payer: Self-pay | Admitting: Obstetrics & Gynecology

## 2018-01-09 DIAGNOSIS — Z1231 Encounter for screening mammogram for malignant neoplasm of breast: Secondary | ICD-10-CM

## 2018-01-09 LAB — CYTOLOGY - PAP
Diagnosis: NEGATIVE
HPV: NOT DETECTED

## 2018-01-09 NOTE — Telephone Encounter (Signed)
Called [facility] scheduled BMD and MMG appt for patient.  appt for Friday 02/24/18. Check in at 7:40am BMD 8:00am MMG 8:30am  Left voicemail for patient with appt information.

## 2018-01-17 ENCOUNTER — Encounter: Payer: Self-pay | Admitting: Podiatry

## 2018-01-17 ENCOUNTER — Ambulatory Visit: Payer: 59 | Admitting: Podiatry

## 2018-01-17 DIAGNOSIS — M7661 Achilles tendinitis, right leg: Secondary | ICD-10-CM | POA: Diagnosis not present

## 2018-01-17 DIAGNOSIS — Z79899 Other long term (current) drug therapy: Secondary | ICD-10-CM | POA: Diagnosis not present

## 2018-01-17 DIAGNOSIS — M722 Plantar fascial fibromatosis: Secondary | ICD-10-CM | POA: Diagnosis not present

## 2018-01-17 MED ORDER — MELOXICAM 15 MG PO TABS: 15.0000 mg | ORAL_TABLET | Freq: Every day | ORAL | 0 refills | Status: DC

## 2018-01-17 MED FILL — MELOXICAM 15 MG TABLET: 15 | 30 days supply | Qty: 30 | Fill #0

## 2018-01-17 MED FILL — GLIMEPIRIDE 4 MG TABLET: 4 | 90 days supply | Qty: 90 | Fill #1

## 2018-01-17 MED FILL — ACCU-CHEK GUIDE STRP: 90 days supply | Qty: 200 | Fill #1

## 2018-01-17 NOTE — Progress Notes (Signed)
Subjective: 62 year old female presents the office today for follow-up evaluation of right plantar fasciitis but she also states that she is to get some pain to the back of her heel and she points on the Achilles tendon.  She also notes some mild bruising to the area.  She denies any recent injury or trauma since I last saw her.  She states that the brace did help with the injection did not help provide significant relief and neither did the anti-inflammatories.  She is also taking the Lamisil she is almost monitor for 30 days and did not have any side effects.  No pain in the nails. Denies any systemic complaints such as fevers, chills, nausea, vomiting. No acute changes since last appointment, and no other complaints at this time.   Objective: AAO x3, NAD DP/PT pulses palpable bilaterally, CRT less than 3 seconds Overall the nails appear to be unchanged and there is nail polish on so I cannot fully evaluate them but there is no pain in the nails and there is no surrounding redness or drainage and there is no signs of infection.  There is tenderness palpation along the plantar medial tubercle of the calcaneus and insertion of the plantar fascia on the right foot however there is no significant pain on the course the plantar fashion the arch of the foot.  There is mild discomfort along the insertion of the Achilles tendon into the calcaneus.  Thompson test is negative the Achilles tendon appears to be intact there is no deficit noted.  There is mild swelling on the posterior calcaneus.  There does appear to be a couple patches of ecchymosis more to the medial heel but there is no significant discomfort to that area. No open lesions or pre-ulcerative lesions.  No pain with calf compression, swelling, warmth, erythema  Assessment: Right plantar fasciitis, Achilles tendinitis; onychomycosis  Plan: -All treatment options discussed with the patient including all alternatives, risks, complications.  -In  regards to onychomycosis going continue Lamisil.  She is having no side effects but continue to monitor closely.  We are going to recheck a CBC and LFT. -Regards to her right foot pain I do recommend immobilization in a cam boot at this time which she Artie has at home.  Doing this because the increase in pain and mild swelling along the Achilles tendon.  I prescribed a compound cream for anti-inflammatory through [employer].  Can also use meloxicam.  Ice to the area daily.  If symptoms continue discussed an MRI but she wishes to hold off on that today. -Patient encouraged to call the office with any questions, concerns, change in symptoms.   Return in about 3 weeks (around 02/07/2018).  Trula Slade DPM

## 2018-01-20 ENCOUNTER — Ambulatory Visit
Admission: RE | Admit: 2018-01-20 | Discharge: 2018-01-20 | Disposition: A | Discharge status: Home / Self Care | Payer: 59 | Source: Ambulatory Visit | Attending: Obstetrics & Gynecology | Admitting: Obstetrics & Gynecology

## 2018-01-20 DIAGNOSIS — H2511 Age-related nuclear cataract, right eye: Secondary | ICD-10-CM | POA: Diagnosis not present

## 2018-01-20 DIAGNOSIS — E113293 Type 2 diabetes mellitus with mild nonproliferative diabetic retinopathy without macular edema, bilateral: Secondary | ICD-10-CM | POA: Diagnosis not present

## 2018-01-20 DIAGNOSIS — H25011 Cortical age-related cataract, right eye: Secondary | ICD-10-CM | POA: Diagnosis not present

## 2018-01-20 DIAGNOSIS — R109 Unspecified abdominal pain: Secondary | ICD-10-CM

## 2018-01-20 DIAGNOSIS — H40013 Open angle with borderline findings, low risk, bilateral: Secondary | ICD-10-CM | POA: Diagnosis not present

## 2018-01-20 DIAGNOSIS — K760 Fatty (change of) liver, not elsewhere classified: Secondary | ICD-10-CM | POA: Diagnosis not present

## 2018-01-20 DIAGNOSIS — Z961 Presence of intraocular lens: Secondary | ICD-10-CM | POA: Diagnosis not present

## 2018-01-27 ENCOUNTER — Telehealth: Payer: Self-pay | Admitting: Obstetrics & Gynecology

## 2018-01-27 NOTE — Telephone Encounter (Signed)
Patient is requesting ultrasound results.

## 2018-01-27 NOTE — Telephone Encounter (Signed)
Dr. Sabra Heck -please review US abdomen results dated 01/20/18 and advise.

## 2018-01-30 NOTE — Telephone Encounter (Signed)
Mailbox full, unable to leave message

## 2018-01-30 NOTE — Telephone Encounter (Signed)
Patient calling for ultrasound results

## 2018-01-31 NOTE — Telephone Encounter (Signed)
Routing to Dr. Miller

## 2018-01-31 NOTE — Telephone Encounter (Signed)
Spoke with patient, calling for US abdomen results.   Advised US abdomen: fatty liver, no acute abnormality noted.   Reviewed with patient ways to reduce risk of fatty liver, healthy diet, healthy weight and exercise.   Patient is scheduled to see Dr. Collene Mares on 02/02/18, will discuss further.   Advised Dr. Sabra Heck will review, I will return call with any additional recommendations. Patient verbalizes understanding.    Routing to provider for final review. Patient is agreeable to disposition. Will close encounter.

## 2018-02-02 ENCOUNTER — Other Ambulatory Visit: Payer: Self-pay | Admitting: Gastroenterology

## 2018-02-02 DIAGNOSIS — K7581 Nonalcoholic steatohepatitis (NASH): Secondary | ICD-10-CM | POA: Diagnosis not present

## 2018-02-02 DIAGNOSIS — R197 Diarrhea, unspecified: Secondary | ICD-10-CM | POA: Diagnosis not present

## 2018-02-02 DIAGNOSIS — R1011 Right upper quadrant pain: Secondary | ICD-10-CM | POA: Diagnosis not present

## 2018-02-02 DIAGNOSIS — R194 Change in bowel habit: Secondary | ICD-10-CM | POA: Diagnosis not present

## 2018-02-02 DIAGNOSIS — Z1211 Encounter for screening for malignant neoplasm of colon: Secondary | ICD-10-CM | POA: Diagnosis not present

## 2018-02-02 DIAGNOSIS — R1033 Periumbilical pain: Secondary | ICD-10-CM | POA: Diagnosis not present

## 2018-02-07 ENCOUNTER — Ambulatory Visit: Payer: 59 | Admitting: Podiatry

## 2018-02-13 ENCOUNTER — Ambulatory Visit: Payer: 59 | Admitting: Podiatry

## 2018-02-13 ENCOUNTER — Other Ambulatory Visit (HOSPITAL_COMMUNITY): Payer: 59

## 2018-02-17 ENCOUNTER — Ambulatory Visit (HOSPITAL_COMMUNITY): Payer: 59

## 2018-02-24 ENCOUNTER — Ambulatory Visit
Admission: RE | Admit: 2018-02-24 | Discharge: 2018-02-24 | Disposition: A | Discharge status: Home / Self Care | Payer: 59 | Source: Ambulatory Visit | Attending: Obstetrics & Gynecology | Admitting: Obstetrics & Gynecology

## 2018-02-24 DIAGNOSIS — Z1231 Encounter for screening mammogram for malignant neoplasm of breast: Secondary | ICD-10-CM

## 2018-02-24 DIAGNOSIS — Z13820 Encounter for screening for osteoporosis: Secondary | ICD-10-CM | POA: Diagnosis not present

## 2018-02-24 DIAGNOSIS — E2839 Other primary ovarian failure: Secondary | ICD-10-CM

## 2018-02-24 DIAGNOSIS — Z780 Asymptomatic menopausal state: Secondary | ICD-10-CM | POA: Diagnosis not present

## 2018-03-10 ENCOUNTER — Ambulatory Visit (HOSPITAL_COMMUNITY)
Admission: RE | Admit: 2018-03-10 | Discharge: 2018-03-10 | Disposition: A | Discharge status: Home / Self Care | Payer: 59 | Source: Ambulatory Visit | Attending: Gastroenterology | Admitting: Gastroenterology

## 2018-03-10 DIAGNOSIS — R1011 Right upper quadrant pain: Secondary | ICD-10-CM | POA: Diagnosis not present

## 2018-03-10 DIAGNOSIS — R110 Nausea: Secondary | ICD-10-CM | POA: Diagnosis not present

## 2018-03-10 MED ORDER — TECHNETIUM TC 99M MEBROFENIN IV KIT: 5.3000 | PACK | Freq: Once | INTRAVENOUS | Status: DC | PRN

## 2018-03-30 ENCOUNTER — Other Ambulatory Visit: Payer: Self-pay | Admitting: Podiatry

## 2018-03-30 DIAGNOSIS — R1033 Periumbilical pain: Secondary | ICD-10-CM | POA: Diagnosis not present

## 2018-03-30 DIAGNOSIS — R194 Change in bowel habit: Secondary | ICD-10-CM | POA: Diagnosis not present

## 2018-03-30 DIAGNOSIS — K7581 Nonalcoholic steatohepatitis (NASH): Secondary | ICD-10-CM | POA: Diagnosis not present

## 2018-03-30 DIAGNOSIS — R110 Nausea: Secondary | ICD-10-CM | POA: Diagnosis not present

## 2018-03-30 MED FILL — LANTUS SOLOSTAR 100 UNITS/M: 100 | 90 days supply | Qty: 54 | Fill #1

## 2018-03-30 MED FILL — ROSUVASTATIN CALCIUM 20 MG: 20 | 90 days supply | Qty: 90 | Fill #2

## 2018-03-30 MED FILL — JANUVIA 100 MG TABLET: 100 | 90 days supply | Qty: 90 | Fill #1

## 2018-04-28 ENCOUNTER — Other Ambulatory Visit: Payer: Self-pay | Admitting: Podiatry

## 2018-04-28 ENCOUNTER — Ambulatory Visit (INDEPENDENT_AMBULATORY_CARE_PROVIDER_SITE_OTHER): Payer: 59

## 2018-04-28 ENCOUNTER — Ambulatory Visit (INDEPENDENT_AMBULATORY_CARE_PROVIDER_SITE_OTHER): Payer: 59 | Admitting: Podiatry

## 2018-04-28 DIAGNOSIS — M778 Other enthesopathies, not elsewhere classified: Secondary | ICD-10-CM

## 2018-04-28 DIAGNOSIS — B351 Tinea unguium: Secondary | ICD-10-CM | POA: Diagnosis not present

## 2018-04-28 DIAGNOSIS — M779 Enthesopathy, unspecified: Secondary | ICD-10-CM

## 2018-04-28 DIAGNOSIS — M7730 Calcaneal spur, unspecified foot: Secondary | ICD-10-CM

## 2018-04-28 DIAGNOSIS — M7661 Achilles tendinitis, right leg: Secondary | ICD-10-CM

## 2018-04-28 DIAGNOSIS — M79676 Pain in unspecified toe(s): Secondary | ICD-10-CM | POA: Diagnosis not present

## 2018-04-28 MED FILL — GAVILYTE-G SOLUTION: 236 | 1 days supply | Qty: 4000 | Fill #0

## 2018-04-30 NOTE — Progress Notes (Signed)
Subjective: 62 year old female presents the office today for follow-up evaluation of continued pain mostly to the back of her heels with the right side worse than the left.  She states that overall her symptoms are about the same and she denies any worsening.  She states that she has not had any injury or trauma to the feet and she denies any redness or swelling.  She also asked for the nails be trimmed as they are getting thick and elongated but really the left hallux toenail as it is being thickening putting pressure on the other toe and wear shoes.  Denies any redness or drainage from the toenail sites. Denies any systemic complaints such as fevers, chills, nausea, vomiting. No acute changes since last appointment, and no other complaints at this time.   Objective: AAO x3, NAD DP/PT pulses palpable bilaterally, CRT less than 3 seconds There is tenderness palpation of the posterior aspect of the calcaneus on insertion of the Achilles tendon.  Spurs palpable.  No pain with lateral compression of calcaneus.  No edema, erythema.  Achilles tendon appears to be intact.  No other areas of pinpoint tenderness identified bilaterally.   Nails are hypertrophic, dystrophic, brittle, discolored, elongated 10. No surrounding redness or drainage. Tenderness nails 1-5 bilaterally. No open lesions or pre-ulcerative lesions are identified today.  Particular left hallux toenail is more hypertrophic compared to the others. No open lesions or pre-ulcerative lesions.  No pain with calf compression, swelling, warmth, erythema  Assessment: Posterior heel spur, Achilles tendinitis; symptomatic onychomycosis  Plan: -All treatment options discussed with the patient including all alternatives, risks, complications.  -Repeat x-rays were obtained reviewed.  Heel spurring is present.  No evidence of acute fracture identified today.  In regards to heel pain, no center to physical therapy.  She states when he stretches it feels  better and I want her to get him good habits and this would be beneficial for her.  Also continue supportive shoes and good arch supports which is also been helpful for her.  She just does not do the treatment consistently. -Nails debrided x10 without any complications or bleeding.  In the future and consider left hallux toenail removal if she wants. -Patient encouraged to call the office with any questions, concerns, change in symptoms.   Trula Slade DPM

## 2018-05-01 MED FILL — GLIMEPIRIDE 4 MG TABLET: 4 | 90 days supply | Qty: 90 | Fill #2

## 2018-05-02 ENCOUNTER — Other Ambulatory Visit: Payer: Self-pay

## 2018-05-02 MED ORDER — VALACYCLOVIR HCL 1 G PO TABS: ORAL_TABLET | ORAL | 3 refills | Status: DC

## 2018-05-02 NOTE — Telephone Encounter (Signed)
Medication refill request: Valtrex 1000mg  Last AEX:  01/06/18 Next AEX: Not scheduled at this time  Last MMG (if hormonal medication request): 03/01/18 Bi-rads 1 neg  Refill authorized: #[id number] with 3RF

## 2018-05-03 MED FILL — valACYclovir HCL 1 GM TABS: 1 | 60 days supply | Qty: 45 | Fill #0

## 2018-05-05 ENCOUNTER — Encounter: Payer: Self-pay | Admitting: Obstetrics & Gynecology

## 2018-05-05 DIAGNOSIS — K621 Rectal polyp: Secondary | ICD-10-CM | POA: Diagnosis not present

## 2018-05-05 DIAGNOSIS — K635 Polyp of colon: Secondary | ICD-10-CM | POA: Diagnosis not present

## 2018-05-05 DIAGNOSIS — D124 Benign neoplasm of descending colon: Secondary | ICD-10-CM | POA: Diagnosis not present

## 2018-05-05 DIAGNOSIS — D128 Benign neoplasm of rectum: Secondary | ICD-10-CM | POA: Diagnosis not present

## 2018-05-05 DIAGNOSIS — K6389 Other specified diseases of intestine: Secondary | ICD-10-CM | POA: Diagnosis not present

## 2018-05-05 DIAGNOSIS — R194 Change in bowel habit: Secondary | ICD-10-CM | POA: Diagnosis not present

## 2018-05-05 DIAGNOSIS — Z1211 Encounter for screening for malignant neoplasm of colon: Secondary | ICD-10-CM | POA: Diagnosis not present

## 2018-05-11 MED FILL — INVOKANA 100 MG TABLET: 100 | 90 days supply | Qty: 90 | Fill #2

## 2018-06-16 DIAGNOSIS — Z6831 Body mass index (BMI) 31.0-31.9, adult: Secondary | ICD-10-CM | POA: Diagnosis not present

## 2018-06-16 DIAGNOSIS — E11319 Type 2 diabetes mellitus with unspecified diabetic retinopathy without macular edema: Secondary | ICD-10-CM | POA: Diagnosis not present

## 2018-06-16 DIAGNOSIS — Z1389 Encounter for screening for other disorder: Secondary | ICD-10-CM | POA: Diagnosis not present

## 2018-06-16 DIAGNOSIS — E1165 Type 2 diabetes mellitus with hyperglycemia: Secondary | ICD-10-CM | POA: Diagnosis not present

## 2018-06-16 DIAGNOSIS — G6289 Other specified polyneuropathies: Secondary | ICD-10-CM | POA: Diagnosis not present

## 2018-06-16 DIAGNOSIS — E668 Other obesity: Secondary | ICD-10-CM | POA: Diagnosis not present

## 2018-06-16 DIAGNOSIS — Z794 Long term (current) use of insulin: Secondary | ICD-10-CM | POA: Diagnosis not present

## 2018-06-16 DIAGNOSIS — M48061 Spinal stenosis, lumbar region without neurogenic claudication: Secondary | ICD-10-CM | POA: Diagnosis not present

## 2018-06-16 MED FILL — RAMIPRIL 2.5 MG CAPSULE: 2.5 | 90 days supply | Qty: 45 | Fill #0

## 2018-06-16 MED FILL — LANTUS SOLOSTAR 100 UNITS/M: 100 | 31 days supply | Qty: 21 | Fill #0

## 2018-07-14 ENCOUNTER — Encounter (INDEPENDENT_AMBULATORY_CARE_PROVIDER_SITE_OTHER): Payer: 59 | Admitting: Ophthalmology

## 2018-07-14 DIAGNOSIS — E113391 Type 2 diabetes mellitus with moderate nonproliferative diabetic retinopathy without macular edema, right eye: Secondary | ICD-10-CM | POA: Diagnosis not present

## 2018-07-14 DIAGNOSIS — H2511 Age-related nuclear cataract, right eye: Secondary | ICD-10-CM

## 2018-07-14 DIAGNOSIS — I10 Essential (primary) hypertension: Secondary | ICD-10-CM

## 2018-07-14 DIAGNOSIS — E11311 Type 2 diabetes mellitus with unspecified diabetic retinopathy with macular edema: Secondary | ICD-10-CM

## 2018-07-14 DIAGNOSIS — E113312 Type 2 diabetes mellitus with moderate nonproliferative diabetic retinopathy with macular edema, left eye: Secondary | ICD-10-CM | POA: Diagnosis not present

## 2018-07-14 DIAGNOSIS — H43813 Vitreous degeneration, bilateral: Secondary | ICD-10-CM

## 2018-07-14 DIAGNOSIS — H35033 Hypertensive retinopathy, bilateral: Secondary | ICD-10-CM

## 2018-07-27 MED FILL — LANTUS SOLOSTAR 100 UNITS/M: 100 | 31 days supply | Qty: 21 | Fill #1

## 2018-07-27 MED FILL — JANUVIA 100 MG TABLET: 100 | 90 days supply | Qty: 90 | Fill #0

## 2018-07-27 MED FILL — GABAPENTIN 300 MG CAPSULE: 300 | 90 days supply | Qty: 270 | Fill #1

## 2018-07-27 MED FILL — INVOKANA 100 MG TABLET: 100 | 90 days supply | Qty: 90 | Fill #0

## 2018-08-01 MED FILL — ROSUVASTATIN CALCIUM 20 MG: 20 | 90 days supply | Qty: 90 | Fill #3

## 2018-08-25 ENCOUNTER — Encounter: Payer: Self-pay | Admitting: Podiatry

## 2018-08-25 ENCOUNTER — Ambulatory Visit: Payer: 59 | Admitting: Podiatry

## 2018-08-25 DIAGNOSIS — M79676 Pain in unspecified toe(s): Secondary | ICD-10-CM | POA: Diagnosis not present

## 2018-08-25 DIAGNOSIS — M7730 Calcaneal spur, unspecified foot: Secondary | ICD-10-CM | POA: Diagnosis not present

## 2018-08-25 DIAGNOSIS — B351 Tinea unguium: Secondary | ICD-10-CM

## 2018-08-25 DIAGNOSIS — E119 Type 2 diabetes mellitus without complications: Secondary | ICD-10-CM

## 2018-08-25 DIAGNOSIS — M7661 Achilles tendinitis, right leg: Secondary | ICD-10-CM | POA: Diagnosis not present

## 2018-08-25 MED ORDER — DICLOFENAC SODIUM 75 MG PO TBEC: 75.0000 mg | DELAYED_RELEASE_TABLET | Freq: Two times a day (BID) | ORAL | 0 refills | Status: DC

## 2018-08-25 MED ORDER — UREA 40 % EX OINT: TOPICAL_OINTMENT | Freq: Every day | CUTANEOUS | 0 refills | Status: DC

## 2018-08-25 MED FILL — ACCU-CHEK GUIDE STRP: 90 days supply | Qty: 200 | Fill #0

## 2018-08-25 MED FILL — DICLOFENAC SODIUM 75 MG TAB: 75 | 15 days supply | Qty: 30 | Fill #0

## 2018-08-25 MED FILL — UREA 40% CREAM: 40 | 30 days supply | Qty: 28 | Fill #0

## 2018-08-25 NOTE — Patient Instructions (Signed)

## 2018-08-26 NOTE — Progress Notes (Signed)
Subjective: 63 year old female presents the office today for concerns of pain to the back of her right heel.  She just got off work and she states that walking aggravates her symptoms.  This is been an ongoing issue for her.  She wants to do physical therapy at this point.  Also discussed other treatment options.  Also she is concerned about her toenails and the fact that she cannot cut them.  She previously was on Lamisil without any significant improvement.  The nails are painful with pressure and she has particularly thickened toenails.  She denies any redness or drainage or any swelling.Denies any systemic complaints such as fevers, chills, nausea, vomiting. No acute changes since last appointment, and no other complaints at this time.   Objective: AAO x3, NAD DP/PT pulses palpable bilaterally, CRT less than 3 seconds To the posterior aspect the right heel is a palpable bone spur present there is localized edema to this area today there is no erythema or warmth.  Thompson test is negative.  Achilles tendon appears to be intact.  No pain with lateral compression of the calcaneus or other areas of tenderness. The toenails are hypertrophic, dystrophic with yellow-brown discoloration tenderness nails 1-5 bilaterally. No open lesions or pre-ulcerative lesions.  No pain with calf compression, swelling, warmth, erythema  Assessment: Posterior calcaneal spurring right side with insertional Achilles tendinitis; symptomatic onychomycosis  Plan: -All treatment options discussed with the patient including all alternatives, risks, complications.  -Reviewed her previous x-rays with her.  Today I dispensed a night splint.  Also order physical [facility] for [facility].  He will hold other anti-inflammatories and recommend switch to Voltaren. -I debrided the nails x10 without any complications or bleeding.  I ordered urea cream for her. -Patient encouraged to call the office with any questions,  concerns, change in symptoms.   Trula Slade DPM

## 2018-08-28 ENCOUNTER — Telehealth: Payer: Self-pay | Admitting: Podiatry

## 2018-08-28 NOTE — Addendum Note (Signed)
Addended by: Cranford Mon R on: 08/28/2018 04:40 PM   Modules accepted: Orders

## 2018-08-28 NOTE — Telephone Encounter (Signed)
I informed [name], the urea ointment could be switched to the urea cream.

## 2018-08-28 NOTE — Telephone Encounter (Signed)
Rachel Gaines called wanting to know if the urea ointment can be urea cream instead as the ointment is not available. Requested a call back to okay the change.

## 2018-09-02 ENCOUNTER — Telehealth: Payer: Self-pay | Admitting: *Deleted

## 2018-09-02 NOTE — Telephone Encounter (Signed)
Per Dr Jacqualyn Posey ok for the patient to get the urea 40% cream due to [facility] did not have the ointment. Lattie Haw

## 2018-09-04 MED FILL — GLIMEPIRIDE 4 MG TABLET: 4 | 90 days supply | Qty: 90 | Fill #0

## 2018-09-22 DIAGNOSIS — M25671 Stiffness of right ankle, not elsewhere classified: Secondary | ICD-10-CM | POA: Diagnosis not present

## 2018-09-22 DIAGNOSIS — M25571 Pain in right ankle and joints of right foot: Secondary | ICD-10-CM | POA: Diagnosis not present

## 2018-09-22 DIAGNOSIS — M722 Plantar fascial fibromatosis: Secondary | ICD-10-CM | POA: Diagnosis not present

## 2018-09-29 ENCOUNTER — Encounter (INDEPENDENT_AMBULATORY_CARE_PROVIDER_SITE_OTHER): Payer: 59 | Admitting: Ophthalmology

## 2018-10-11 ENCOUNTER — Other Ambulatory Visit: Payer: Self-pay

## 2018-10-11 ENCOUNTER — Encounter (INDEPENDENT_AMBULATORY_CARE_PROVIDER_SITE_OTHER): Payer: 59 | Admitting: Ophthalmology

## 2018-10-11 DIAGNOSIS — E113391 Type 2 diabetes mellitus with moderate nonproliferative diabetic retinopathy without macular edema, right eye: Secondary | ICD-10-CM | POA: Diagnosis not present

## 2018-10-11 DIAGNOSIS — H43813 Vitreous degeneration, bilateral: Secondary | ICD-10-CM

## 2018-10-11 DIAGNOSIS — E113312 Type 2 diabetes mellitus with moderate nonproliferative diabetic retinopathy with macular edema, left eye: Secondary | ICD-10-CM

## 2018-10-11 DIAGNOSIS — H35033 Hypertensive retinopathy, bilateral: Secondary | ICD-10-CM | POA: Diagnosis not present

## 2018-10-11 DIAGNOSIS — E11311 Type 2 diabetes mellitus with unspecified diabetic retinopathy with macular edema: Secondary | ICD-10-CM | POA: Diagnosis not present

## 2018-10-11 DIAGNOSIS — I10 Essential (primary) hypertension: Secondary | ICD-10-CM | POA: Diagnosis not present

## 2018-10-12 MED FILL — BESIVANCE 0.6% SUSP: 0.6 | 30 days supply | Qty: 5 | Fill #0

## 2018-10-14 MED FILL — LANTUS SOLOSTAR 100 UNITS/M: 100 | 30 days supply | Qty: 21 | Fill #0 | Status: TO

## 2018-10-27 ENCOUNTER — Ambulatory Visit: Payer: 59 | Admitting: Podiatry

## 2018-11-09 ENCOUNTER — Encounter (INDEPENDENT_AMBULATORY_CARE_PROVIDER_SITE_OTHER): Payer: 59 | Admitting: Ophthalmology

## 2018-11-10 ENCOUNTER — Other Ambulatory Visit: Payer: Self-pay

## 2018-11-10 ENCOUNTER — Encounter (INDEPENDENT_AMBULATORY_CARE_PROVIDER_SITE_OTHER): Payer: 59 | Admitting: Ophthalmology

## 2018-11-10 DIAGNOSIS — H35033 Hypertensive retinopathy, bilateral: Secondary | ICD-10-CM | POA: Diagnosis not present

## 2018-11-10 DIAGNOSIS — H43813 Vitreous degeneration, bilateral: Secondary | ICD-10-CM | POA: Diagnosis not present

## 2018-11-10 DIAGNOSIS — E11311 Type 2 diabetes mellitus with unspecified diabetic retinopathy with macular edema: Secondary | ICD-10-CM | POA: Diagnosis not present

## 2018-11-10 DIAGNOSIS — E113312 Type 2 diabetes mellitus with moderate nonproliferative diabetic retinopathy with macular edema, left eye: Secondary | ICD-10-CM

## 2018-11-10 DIAGNOSIS — I10 Essential (primary) hypertension: Secondary | ICD-10-CM | POA: Diagnosis not present

## 2018-11-10 DIAGNOSIS — E113291 Type 2 diabetes mellitus with mild nonproliferative diabetic retinopathy without macular edema, right eye: Secondary | ICD-10-CM | POA: Diagnosis not present

## 2018-11-21 MED FILL — ROSUVASTATIN CALCIUM 20 MG: 20 | 90 days supply | Qty: 90 | Fill #0

## 2018-11-24 ENCOUNTER — Ambulatory Visit: Payer: 59 | Admitting: Podiatry

## 2018-11-24 DIAGNOSIS — E559 Vitamin D deficiency, unspecified: Secondary | ICD-10-CM | POA: Diagnosis not present

## 2018-11-24 DIAGNOSIS — Z0000 Encounter for general adult medical examination without abnormal findings: Secondary | ICD-10-CM | POA: Diagnosis not present

## 2018-11-24 DIAGNOSIS — E1165 Type 2 diabetes mellitus with hyperglycemia: Secondary | ICD-10-CM | POA: Diagnosis not present

## 2018-11-24 MED FILL — JANUVIA 100 MG TABLET: 100 | 30 days supply | Qty: 30 | Fill #1

## 2018-11-28 DIAGNOSIS — Z0000 Encounter for general adult medical examination without abnormal findings: Secondary | ICD-10-CM | POA: Diagnosis not present

## 2018-11-28 DIAGNOSIS — E669 Obesity, unspecified: Secondary | ICD-10-CM | POA: Diagnosis not present

## 2018-11-28 DIAGNOSIS — E785 Hyperlipidemia, unspecified: Secondary | ICD-10-CM | POA: Diagnosis not present

## 2018-11-28 DIAGNOSIS — E11319 Type 2 diabetes mellitus with unspecified diabetic retinopathy without macular edema: Secondary | ICD-10-CM | POA: Diagnosis not present

## 2018-11-28 DIAGNOSIS — Z794 Long term (current) use of insulin: Secondary | ICD-10-CM | POA: Diagnosis not present

## 2018-11-28 DIAGNOSIS — E1165 Type 2 diabetes mellitus with hyperglycemia: Secondary | ICD-10-CM | POA: Diagnosis not present

## 2018-11-28 DIAGNOSIS — Z1331 Encounter for screening for depression: Secondary | ICD-10-CM | POA: Diagnosis not present

## 2018-11-28 DIAGNOSIS — G629 Polyneuropathy, unspecified: Secondary | ICD-10-CM | POA: Diagnosis not present

## 2018-11-28 DIAGNOSIS — R0609 Other forms of dyspnea: Secondary | ICD-10-CM | POA: Diagnosis not present

## 2018-11-28 DIAGNOSIS — R51 Headache: Secondary | ICD-10-CM | POA: Diagnosis not present

## 2018-11-28 MED FILL — ROSUVASTATIN CALCIUM 40 MG: 40 | 90 days supply | Qty: 90 | Fill #0

## 2018-11-28 MED FILL — GLIMEPIRIDE 4 MG TABLET: 4 | 90 days supply | Qty: 90 | Fill #0

## 2018-11-28 MED FILL — VIT D2 1.25 MG (50,000 UNIT: 1.25 MG | 84 days supply | Qty: 12 | Fill #0

## 2018-11-29 MED FILL — FREESTYLE LITE METER: 30 days supply | Qty: 1 | Fill #0

## 2018-11-29 MED FILL — FREESTYLE LITE TEST STRIP: 90 days supply | Qty: 200 | Fill #0

## 2018-11-29 MED FILL — FREESTYLE LANCETS: 90 days supply | Qty: 200 | Fill #0

## 2018-11-30 DIAGNOSIS — E1165 Type 2 diabetes mellitus with hyperglycemia: Secondary | ICD-10-CM | POA: Diagnosis not present

## 2018-11-30 DIAGNOSIS — R82998 Other abnormal findings in urine: Secondary | ICD-10-CM | POA: Diagnosis not present

## 2018-12-05 MED FILL — LANTUS SOLOSTAR 100 UNITS/M: 100 | 30 days supply | Qty: 21 | Fill #0

## 2018-12-07 ENCOUNTER — Encounter (INDEPENDENT_AMBULATORY_CARE_PROVIDER_SITE_OTHER): Payer: 59 | Admitting: Ophthalmology

## 2018-12-07 ENCOUNTER — Other Ambulatory Visit: Payer: Self-pay

## 2018-12-07 DIAGNOSIS — E113312 Type 2 diabetes mellitus with moderate nonproliferative diabetic retinopathy with macular edema, left eye: Secondary | ICD-10-CM

## 2018-12-07 DIAGNOSIS — E113291 Type 2 diabetes mellitus with mild nonproliferative diabetic retinopathy without macular edema, right eye: Secondary | ICD-10-CM | POA: Diagnosis not present

## 2018-12-07 DIAGNOSIS — H35033 Hypertensive retinopathy, bilateral: Secondary | ICD-10-CM | POA: Diagnosis not present

## 2018-12-07 DIAGNOSIS — H43813 Vitreous degeneration, bilateral: Secondary | ICD-10-CM

## 2018-12-07 DIAGNOSIS — E11311 Type 2 diabetes mellitus with unspecified diabetic retinopathy with macular edema: Secondary | ICD-10-CM | POA: Diagnosis not present

## 2018-12-07 DIAGNOSIS — H2511 Age-related nuclear cataract, right eye: Secondary | ICD-10-CM

## 2018-12-07 DIAGNOSIS — I10 Essential (primary) hypertension: Secondary | ICD-10-CM

## 2018-12-08 ENCOUNTER — Encounter: Payer: Self-pay | Admitting: Obstetrics & Gynecology

## 2018-12-08 ENCOUNTER — Ambulatory Visit: Payer: 59 | Admitting: Obstetrics & Gynecology

## 2018-12-08 ENCOUNTER — Other Ambulatory Visit: Payer: Self-pay

## 2018-12-08 VITALS — BP 126/70 | HR 80 | Temp 98.0°F | Ht 64.0 in | Wt 168.0 lb

## 2018-12-08 DIAGNOSIS — N898 Other specified noninflammatory disorders of vagina: Secondary | ICD-10-CM | POA: Diagnosis not present

## 2018-12-08 MED ORDER — FLUCONAZOLE 150 MG PO TABS: ORAL_TABLET | ORAL | 2 refills | Status: DC

## 2018-12-08 MED ORDER — TERCONAZOLE 0.4 % VA CREA: 1.0000 | TOPICAL_CREAM | Freq: Every day | VAGINAL | 0 refills | Status: DC

## 2018-12-08 MED FILL — CLINDAMYCIN HCL 300 MG CAPS: 300 | 10 days supply | Qty: 40 | Fill #0

## 2018-12-08 MED FILL — TERCONAZOLE 0.4% VAG CREAM: 0.4 | 7 days supply | Qty: 45 | Fill #0

## 2018-12-08 MED FILL — FLUCONAZOLE 150 MG TABS: 150 | 4 days supply | Qty: 2 | Fill #0

## 2018-12-08 MED FILL — HYDROCODON-APAP 10-325: 10-325 | 8 days supply | Qty: 30 | Fill #0

## 2018-12-08 NOTE — Progress Notes (Signed)
GYNECOLOGY  VISIT  CC:   Vaginitis symptoms   HPI: 63 y.o. [id number] Single Black or African American female here for itching and burning x 1 week.  She is having some associated vaginal discharge.  She is not SA.  Denies pelvic pain, back pain or urinary symptoms.  Denies vaginal bleeding.  She admits she is not using her insulin as regularly as she is almost out. BPs have been in the 140s and 150s in the last week.  Needs coupon for Lantus cost.  Helped her with Lantus coupon today.  Reports she has rx at [facility].  GYNECOLOGIC HISTORY: Patient's last menstrual period was 06/28/2006 (approximate). Contraception: PMP Menopausal hormone therapy: none  Patient Active Problem List   Diagnosis Date Noted  . Tendonitis, Achilles, right 01/17/2018  . Heel pain 09/26/2015  . Plantar fasciitis of right foot 10/25/2014  . Equinus deformity of foot, acquired 10/25/2014  . Tarsal tunnel syndrome 10/25/2014  . Deformity of metatarsal bone of right foot 10/25/2014  . Genital herpes, unspecified 12/21/2012  . DIABETES MELLITUS II, UNCOMPLICATED 09/62/8366  . OBESITY, NOS 08/25/2006  . ANEMIA, ACUTE BLOOD LOSS 08/25/2006  . DEPRESSION, MAJOR, RECURRENT 08/25/2006  . HEADACHE, UNSPECIFIED 08/25/2006    Past Medical History:  Diagnosis Date  . Abnormal Pap smear of cervix 2014   ASCUS with negative HR HPV   . Anemia   . Cataract   . Diabetes mellitus without complication ([place])   . Hyperlipidemia    on medicine  . Plantar fasciitis   . 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 CATARACT EXTRACTION  2019  . COLONOSCOPY    . COLPOSCOPY W/ BIOPSY / CURETTAGE  02/04/1999   Chronic cervicitis    MEDS:   Current Outpatient Medications on File Prior to Visit  Medication Sig Dispense Refill  . aspirin EC 81 MG tablet Take 81 mg by mouth daily.    Marland Kitchen b complex vitamins tablet Take 1 tablet by mouth daily.    Marland Kitchen BESIVANCE  0.6 % SUSP     . Biotin (BIOTIN 5000) 5 MG CAPS Take 1 capsule by mouth daily.    . canagliflozin (INVOKANA) 100 MG TABS tablet Take 100 mg by mouth.    . gabapentin (NEURONTIN) 300 MG capsule Take 300 mg by mouth 3 (three) times daily. Only takes at night    . glimepiride (AMARYL) 4 MG tablet Take 4 mg by mouth daily with breakfast.    . LANTUS SOLOSTAR 100 UNIT/ML Solostar Pen Inject 34 Units into the skin 2 (two) times daily. If CBG <80 in AM, take only 20 units.   1  . Magnesium 250 MG TABS Take 250 mg by mouth daily. Reported on 09/19/2015    . Multiple Vitamin (MULTIVITAMIN) tablet Take 1 tablet by mouth daily. Reported on 09/19/2015    . NON FORMULARY [facility]  Achilles Tendonitis Cream- Diclofenac 3%, Baclofen 2%, Bupivacaine 1%, Doxepin 5%, Gabapentin 6%, Ibuprofen 3%, Pentoxifylline 3% Apply 1-2 grams to affected area 3-4 times daily Qty. 120 gm 3 refills    . NONFORMULARY OR COMPOUNDED ITEM [facility]:  Antiinflammatory cream - Diclofenac 3%, Baclofen 2%, Cyclobenzaprine 2%, Lidocaine 2%, dispense 120grams, apply 1-2 grams to affected area 3-4 times a day, +2refills. 120 each 2  . ramipril (ALTACE) 2.5 MG capsule Take 2.5 mg by mouth every other day.     . rosuvastatin (CRESTOR)  40 MG tablet Take 1 tablet by mouth daily.    . sitaGLIPtin (JANUVIA) 100 MG tablet Take 100 mg by mouth daily.    . urea (GORDONS UREA) 40 % ointment Apply topically at bedtime. Apply to toenails 30 g 0  . valACYclovir (VALTREX) 1000 MG tablet Take 1/2 daily and then 1/2 tablet twice a day for 3-5 days for flare 90 tablet 3  . Vitamin D, Ergocalciferol, (DRISDOL) 50000 units CAPS capsule Take 1 capsule (50,000 Units total) by mouth every 7 (seven) days. 30 capsule 1   No current facility-administered medications on file prior to visit.     ALLERGIES: Penicillins  Family History  Problem Relation Age of Onset  . Diabetes Mother   . Diabetes Father   . Heart disease Father   . Cancer  Father        Prostate  . Heart failure Father   . Heart disease Sister   . Diabetes Sister   . Diabetes Brother   . Lupus Daughter   . Cancer Brother        lung cancer  . Diabetes Brother   . Diabetes Sister   . Heart disease Sister   . Heart failure Sister   . Thyroid disease Sister   . Diabetes Sister   . Diabetes Sister   . Diabetes Sister   . Diabetes Sister   . Diabetes Sister   . Diabetes Sister   . Colon cancer Neg Hx   . Colon polyps Neg Hx   . Rectal cancer Neg Hx   . Stomach cancer Neg Hx   . Esophageal cancer Neg Hx     SH:  Single, non smoker  Review of Systems  Genitourinary: Positive for frequency.       Itching and burning   All other systems reviewed and are negative.   PHYSICAL EXAMINATION:    BP 126/70   Pulse 80   Temp 98 F (36.7 C) (Temporal)   Ht 5\' 4"  (1.626 m)   Wt 168 lb (76.2 kg)   LMP 06/28/2006 (Approximate)   BMI 28.84 kg/m     General appearance: alert, cooperative and appears stated age Abdomen: soft, non-tender; bowel sounds normal; no masses,  no organomegaly Lymph:  no inguinal LAD noted  Pelvic: External genitalia:  no lesions              Urethra:  normal appearing urethra with no masses, tenderness or lesions              Bartholins and Skenes: normal                 Vagina: normal appearing vagina with yellowish discharge, no lesions              Cervix: no lesions              Bimanual Exam:  Uterus:  normal size, contour, position, consistency, mobility, non-tender              Adnexa: no mass, fullness, tenderness  Chaperone was present for exam.  Assessment: Vaginal discharge Vaginal and vulvar itching Diabetes, insulin requiring  Plan: Rx for diflucan 150mg  po x 1, repeat 72 hours.  #2/0RF Terazol 7 nightly vaginally and externally for 7 days for symptom relief  Affirm probe obtained today

## 2018-12-09 LAB — VAGINITIS/VAGINOSIS, DNA PROBE
Candida Species: NEGATIVE
Gardnerella vaginalis: NEGATIVE
Trichomonas vaginosis: NEGATIVE

## 2018-12-12 ENCOUNTER — Telehealth: Payer: Self-pay | Admitting: *Deleted

## 2018-12-12 NOTE — Telephone Encounter (Signed)
-----   Message from Megan Salon, MD sent at 12/09/2018 10:45 PM EDT ----- Please let pt know her vaginitis testing was negative.  How are her symptoms?  Was she able to get her insulin?

## 2018-12-12 NOTE — Telephone Encounter (Signed)
LM for pt with normal results and to call back to update symptoms. Per pt release form.

## 2018-12-15 ENCOUNTER — Ambulatory Visit: Payer: 59 | Admitting: Podiatry

## 2018-12-15 ENCOUNTER — Other Ambulatory Visit: Payer: Self-pay

## 2018-12-15 ENCOUNTER — Encounter: Payer: Self-pay | Admitting: Podiatry

## 2018-12-15 VITALS — Temp 97.6°F

## 2018-12-15 DIAGNOSIS — E119 Type 2 diabetes mellitus without complications: Secondary | ICD-10-CM

## 2018-12-15 DIAGNOSIS — B351 Tinea unguium: Secondary | ICD-10-CM | POA: Diagnosis not present

## 2018-12-15 DIAGNOSIS — M79676 Pain in unspecified toe(s): Secondary | ICD-10-CM | POA: Diagnosis not present

## 2018-12-15 NOTE — Telephone Encounter (Signed)
Patient states she is feeling better and not having any symptoms. She was able to get her insulin with a coupon from the pharmacy.  Encounter closed.

## 2018-12-21 DIAGNOSIS — Z794 Long term (current) use of insulin: Secondary | ICD-10-CM | POA: Diagnosis not present

## 2018-12-21 DIAGNOSIS — E1165 Type 2 diabetes mellitus with hyperglycemia: Secondary | ICD-10-CM | POA: Diagnosis not present

## 2018-12-21 DIAGNOSIS — R079 Chest pain, unspecified: Secondary | ICD-10-CM | POA: Diagnosis not present

## 2018-12-22 NOTE — Progress Notes (Signed)
Subjective: 63 year old female presents the office today with concerns mostly of thick toenails mostly in the left big toe that she cannot trim himself.  The nails do cause pain with pressure in shoes.  Denies any redness or drainage or any swelling.  Overall the heels are doing much better.  He should get some occasional discomfort but is much improved.  No swelling, recent injury. Denies any systemic complaints such as fevers, chills, nausea, vomiting. No acute changes since last appointment, and no other complaints at this time.   Objective: AAO x3, NAD DP/PT pulses palpable bilaterally, CRT less than 3 seconds Nails are hypertrophic, dystrophic, brittle, discolored, elongated 10. No surrounding redness or drainage. Tenderness nails 1-5 bilaterally. No open lesions or pre-ulcerative lesions are identified today. At this time there is no tenderness on palpation of the posterior aspect of the heel along the course/insertion of Achilles tendon.  Thompson test is negative. No open lesions or pre-ulcerative lesions.  No pain with calf compression, swelling, warmth, erythema  Assessment: Symptomatic onychomycosis  Plan: -All treatment options discussed with the patient including all alternatives, risks, complications.  -Debrided nails x10 without any complications or bleeding. -Continue with stretching, ice today as well as supportive shoes.  Offloading for the heel spur.  -Patient encouraged to call the office with any questions, concerns, change in symptoms.   Trula Slade DPM

## 2019-01-03 MED FILL — INVOKANA 100 MG TABLET: 100 | 30 days supply | Qty: 30 | Fill #1

## 2019-01-03 MED FILL — BESIVANCE 0.6% SUSP: 0.6 | 30 days supply | Qty: 5 | Fill #0

## 2019-01-03 MED FILL — JANUVIA 100 MG TABLET: 100 | 30 days supply | Qty: 30 | Fill #2

## 2019-01-04 ENCOUNTER — Other Ambulatory Visit: Payer: Self-pay

## 2019-01-04 ENCOUNTER — Encounter (INDEPENDENT_AMBULATORY_CARE_PROVIDER_SITE_OTHER): Payer: 59 | Admitting: Ophthalmology

## 2019-01-04 DIAGNOSIS — H2511 Age-related nuclear cataract, right eye: Secondary | ICD-10-CM

## 2019-01-04 DIAGNOSIS — H43813 Vitreous degeneration, bilateral: Secondary | ICD-10-CM | POA: Diagnosis not present

## 2019-01-04 DIAGNOSIS — H35033 Hypertensive retinopathy, bilateral: Secondary | ICD-10-CM

## 2019-01-04 DIAGNOSIS — E113312 Type 2 diabetes mellitus with moderate nonproliferative diabetic retinopathy with macular edema, left eye: Secondary | ICD-10-CM

## 2019-01-04 DIAGNOSIS — I10 Essential (primary) hypertension: Secondary | ICD-10-CM | POA: Diagnosis not present

## 2019-01-04 DIAGNOSIS — E113391 Type 2 diabetes mellitus with moderate nonproliferative diabetic retinopathy without macular edema, right eye: Secondary | ICD-10-CM

## 2019-01-04 DIAGNOSIS — E11311 Type 2 diabetes mellitus with unspecified diabetic retinopathy with macular edema: Secondary | ICD-10-CM

## 2019-01-05 DIAGNOSIS — Z794 Long term (current) use of insulin: Secondary | ICD-10-CM | POA: Diagnosis not present

## 2019-01-05 DIAGNOSIS — E113212 Type 2 diabetes mellitus with mild nonproliferative diabetic retinopathy with macular edema, left eye: Secondary | ICD-10-CM | POA: Diagnosis not present

## 2019-01-05 DIAGNOSIS — H40013 Open angle with borderline findings, low risk, bilateral: Secondary | ICD-10-CM | POA: Diagnosis not present

## 2019-01-05 DIAGNOSIS — H25041 Posterior subcapsular polar age-related cataract, right eye: Secondary | ICD-10-CM | POA: Diagnosis not present

## 2019-01-11 MED FILL — LANTUS SOLOSTAR 100 UNITS/M: 100 | 30 days supply | Qty: 21 | Fill #1

## 2019-01-15 MED FILL — HYDROCODON-APAP 10-325: 10-325 | 5 days supply | Qty: 20 | Fill #0

## 2019-01-16 DIAGNOSIS — M545 Low back pain: Secondary | ICD-10-CM | POA: Diagnosis not present

## 2019-01-16 DIAGNOSIS — M1612 Unilateral primary osteoarthritis, left hip: Secondary | ICD-10-CM | POA: Diagnosis not present

## 2019-01-17 MED FILL — predniSONE 5 MG TABS: 5 | 12 days supply | Qty: 48 | Fill #0

## 2019-02-02 ENCOUNTER — Other Ambulatory Visit: Payer: Self-pay

## 2019-02-02 ENCOUNTER — Encounter (INDEPENDENT_AMBULATORY_CARE_PROVIDER_SITE_OTHER): Payer: 59 | Admitting: Ophthalmology

## 2019-02-02 DIAGNOSIS — E113312 Type 2 diabetes mellitus with moderate nonproliferative diabetic retinopathy with macular edema, left eye: Secondary | ICD-10-CM | POA: Diagnosis not present

## 2019-02-02 DIAGNOSIS — H43813 Vitreous degeneration, bilateral: Secondary | ICD-10-CM

## 2019-02-02 DIAGNOSIS — E11311 Type 2 diabetes mellitus with unspecified diabetic retinopathy with macular edema: Secondary | ICD-10-CM

## 2019-02-02 DIAGNOSIS — I10 Essential (primary) hypertension: Secondary | ICD-10-CM | POA: Diagnosis not present

## 2019-02-02 DIAGNOSIS — H35033 Hypertensive retinopathy, bilateral: Secondary | ICD-10-CM

## 2019-02-02 DIAGNOSIS — E113391 Type 2 diabetes mellitus with moderate nonproliferative diabetic retinopathy without macular edema, right eye: Secondary | ICD-10-CM | POA: Diagnosis not present

## 2019-02-02 DIAGNOSIS — H2511 Age-related nuclear cataract, right eye: Secondary | ICD-10-CM | POA: Diagnosis not present

## 2019-02-02 DIAGNOSIS — H25041 Posterior subcapsular polar age-related cataract, right eye: Secondary | ICD-10-CM | POA: Diagnosis not present

## 2019-02-02 DIAGNOSIS — H25011 Cortical age-related cataract, right eye: Secondary | ICD-10-CM | POA: Diagnosis not present

## 2019-02-02 MED FILL — JANUVIA 100 MG TABLET: 100 | 30 days supply | Qty: 30 | Fill #3

## 2019-02-15 MED FILL — JARDIANCE 10 MG TABLET: 10 | 30 days supply | Qty: 30 | Fill #0

## 2019-02-27 MED FILL — LANTUS SOLOSTAR 100 UNITS/M: 100 | 30 days supply | Qty: 21 | Fill #2

## 2019-03-01 ENCOUNTER — Encounter (INDEPENDENT_AMBULATORY_CARE_PROVIDER_SITE_OTHER): Payer: 59 | Admitting: Ophthalmology

## 2019-03-09 ENCOUNTER — Other Ambulatory Visit: Payer: Self-pay

## 2019-03-09 ENCOUNTER — Encounter (INDEPENDENT_AMBULATORY_CARE_PROVIDER_SITE_OTHER): Payer: 59 | Admitting: Ophthalmology

## 2019-03-09 DIAGNOSIS — E113312 Type 2 diabetes mellitus with moderate nonproliferative diabetic retinopathy with macular edema, left eye: Secondary | ICD-10-CM | POA: Diagnosis not present

## 2019-03-09 DIAGNOSIS — E113391 Type 2 diabetes mellitus with moderate nonproliferative diabetic retinopathy without macular edema, right eye: Secondary | ICD-10-CM

## 2019-03-09 DIAGNOSIS — E11311 Type 2 diabetes mellitus with unspecified diabetic retinopathy with macular edema: Secondary | ICD-10-CM

## 2019-03-09 DIAGNOSIS — H43813 Vitreous degeneration, bilateral: Secondary | ICD-10-CM

## 2019-03-09 DIAGNOSIS — H35033 Hypertensive retinopathy, bilateral: Secondary | ICD-10-CM

## 2019-03-09 DIAGNOSIS — H2511 Age-related nuclear cataract, right eye: Secondary | ICD-10-CM | POA: Diagnosis not present

## 2019-03-09 DIAGNOSIS — I10 Essential (primary) hypertension: Secondary | ICD-10-CM | POA: Diagnosis not present

## 2019-03-12 MED FILL — JANUVIA 100 MG TABLET: 100 | 30 days supply | Qty: 30 | Fill #4

## 2019-03-12 MED FILL — GLIMEPIRIDE 4 MG TABLET: 4 | 90 days supply | Qty: 90 | Fill #1

## 2019-03-14 DIAGNOSIS — H25011 Cortical age-related cataract, right eye: Secondary | ICD-10-CM | POA: Diagnosis not present

## 2019-03-14 DIAGNOSIS — H25041 Posterior subcapsular polar age-related cataract, right eye: Secondary | ICD-10-CM | POA: Diagnosis not present

## 2019-03-14 DIAGNOSIS — H2511 Age-related nuclear cataract, right eye: Secondary | ICD-10-CM | POA: Diagnosis not present

## 2019-03-14 DIAGNOSIS — H25811 Combined forms of age-related cataract, right eye: Secondary | ICD-10-CM | POA: Diagnosis not present

## 2019-03-19 MED FILL — JARDIANCE 10 MG TABLET: 10 | 30 days supply | Qty: 30 | Fill #1

## 2019-03-23 ENCOUNTER — Ambulatory Visit: Payer: 59 | Admitting: Podiatry

## 2019-03-27 ENCOUNTER — Other Ambulatory Visit: Payer: Self-pay | Admitting: Obstetrics & Gynecology

## 2019-03-27 DIAGNOSIS — Z1231 Encounter for screening mammogram for malignant neoplasm of breast: Secondary | ICD-10-CM

## 2019-03-30 DIAGNOSIS — R079 Chest pain, unspecified: Secondary | ICD-10-CM | POA: Diagnosis not present

## 2019-03-30 DIAGNOSIS — R0609 Other forms of dyspnea: Secondary | ICD-10-CM | POA: Diagnosis not present

## 2019-03-30 DIAGNOSIS — E1165 Type 2 diabetes mellitus with hyperglycemia: Secondary | ICD-10-CM | POA: Diagnosis not present

## 2019-03-30 DIAGNOSIS — E11319 Type 2 diabetes mellitus with unspecified diabetic retinopathy without macular edema: Secondary | ICD-10-CM | POA: Diagnosis not present

## 2019-03-30 DIAGNOSIS — R519 Headache, unspecified: Secondary | ICD-10-CM | POA: Diagnosis not present

## 2019-03-30 DIAGNOSIS — E669 Obesity, unspecified: Secondary | ICD-10-CM | POA: Diagnosis not present

## 2019-03-30 DIAGNOSIS — Z794 Long term (current) use of insulin: Secondary | ICD-10-CM | POA: Diagnosis not present

## 2019-03-30 MED FILL — LANTUS SOLOSTAR 100 UNITS/M: 100 | 30 days supply | Qty: 21 | Fill #0

## 2019-03-30 MED FILL — GABAPENTIN 300 MG CAPSULE: 300 | 90 days supply | Qty: 270 | Fill #0

## 2019-03-30 MED FILL — RAMIPRIL 2.5 MG CAPSULE: 2.5 | 90 days supply | Qty: 45 | Fill #0

## 2019-04-02 ENCOUNTER — Telehealth: Payer: Self-pay | Admitting: Cardiology

## 2019-04-02 NOTE — Telephone Encounter (Signed)
LVM for patient to call and schedule a new patient appointment for chest pain and dyspepsia.

## 2019-04-05 DIAGNOSIS — Z23 Encounter for immunization: Secondary | ICD-10-CM | POA: Diagnosis not present

## 2019-04-06 ENCOUNTER — Other Ambulatory Visit: Payer: Self-pay

## 2019-04-06 ENCOUNTER — Encounter (INDEPENDENT_AMBULATORY_CARE_PROVIDER_SITE_OTHER): Payer: 59 | Admitting: Ophthalmology

## 2019-04-06 DIAGNOSIS — H43813 Vitreous degeneration, bilateral: Secondary | ICD-10-CM

## 2019-04-06 DIAGNOSIS — E11311 Type 2 diabetes mellitus with unspecified diabetic retinopathy with macular edema: Secondary | ICD-10-CM

## 2019-04-06 DIAGNOSIS — I10 Essential (primary) hypertension: Secondary | ICD-10-CM | POA: Diagnosis not present

## 2019-04-06 DIAGNOSIS — E113313 Type 2 diabetes mellitus with moderate nonproliferative diabetic retinopathy with macular edema, bilateral: Secondary | ICD-10-CM | POA: Diagnosis not present

## 2019-04-06 DIAGNOSIS — H35033 Hypertensive retinopathy, bilateral: Secondary | ICD-10-CM

## 2019-04-06 DIAGNOSIS — E1165 Type 2 diabetes mellitus with hyperglycemia: Secondary | ICD-10-CM | POA: Diagnosis not present

## 2019-04-06 MED FILL — PREDNISOLONE AC 1% EYE DROP: 1 | 30 days supply | Qty: 5 | Fill #0

## 2019-04-13 DIAGNOSIS — Z20828 Contact with and (suspected) exposure to other viral communicable diseases: Secondary | ICD-10-CM | POA: Diagnosis not present

## 2019-04-19 MED FILL — ROSUVASTATIN CALCIUM 40 MG: 40 | 90 days supply | Qty: 90 | Fill #1

## 2019-04-19 MED FILL — JANUVIA 100 MG TABLET: 100 | 30 days supply | Qty: 30 | Fill #5

## 2019-04-19 MED FILL — JARDIANCE 10 MG TABLET: 10 | 30 days supply | Qty: 30 | Fill #2

## 2019-04-27 ENCOUNTER — Ambulatory Visit: Payer: 59 | Admitting: Cardiology

## 2019-04-27 ENCOUNTER — Other Ambulatory Visit: Payer: Self-pay

## 2019-04-27 ENCOUNTER — Encounter: Payer: Self-pay | Admitting: Cardiology

## 2019-04-27 VITALS — BP 118/70 | HR 66 | Temp 97.0°F | Ht 64.0 in | Wt 176.0 lb

## 2019-04-27 DIAGNOSIS — Z01812 Encounter for preprocedural laboratory examination: Secondary | ICD-10-CM | POA: Diagnosis not present

## 2019-04-27 DIAGNOSIS — R0609 Other forms of dyspnea: Secondary | ICD-10-CM

## 2019-04-27 DIAGNOSIS — R072 Precordial pain: Secondary | ICD-10-CM | POA: Diagnosis not present

## 2019-04-27 DIAGNOSIS — E669 Obesity, unspecified: Secondary | ICD-10-CM | POA: Diagnosis not present

## 2019-04-27 DIAGNOSIS — Z7189 Other specified counseling: Secondary | ICD-10-CM | POA: Diagnosis not present

## 2019-04-27 DIAGNOSIS — E782 Mixed hyperlipidemia: Secondary | ICD-10-CM | POA: Diagnosis not present

## 2019-04-27 DIAGNOSIS — R0600 Dyspnea, unspecified: Secondary | ICD-10-CM

## 2019-04-27 DIAGNOSIS — E1169 Type 2 diabetes mellitus with other specified complication: Secondary | ICD-10-CM | POA: Diagnosis not present

## 2019-04-27 DIAGNOSIS — Z79899 Other long term (current) drug therapy: Secondary | ICD-10-CM

## 2019-04-27 LAB — BASIC METABOLIC PANEL
BUN/Creatinine Ratio: 7 — ABNORMAL LOW (ref 12–28)
BUN: 5 mg/dL — ABNORMAL LOW (ref 8–27)
CO2: 24 mmol/L (ref 20–29)
Calcium: 9.9 mg/dL (ref 8.7–10.3)
Chloride: 105 mmol/L (ref 96–106)
Creatinine, Ser: 0.68 mg/dL (ref 0.57–1.00)
GFR calc Af Amer: 107 mL/min/{1.73_m2} (ref >59)
GFR calc non Af Amer: 93 mL/min/{1.73_m2} (ref >59)
Glucose: 169 mg/dL — ABNORMAL HIGH (ref 65–99)
Potassium: 4.2 mmol/L (ref 3.5–5.2)
Sodium: 146 mmol/L — ABNORMAL HIGH (ref 134–144)

## 2019-04-27 LAB — LIPID PANEL
Chol/HDL Ratio: 3.3 ratio (ref 0.0–4.4)
Cholesterol, Total: 156 mg/dL (ref 100–199)
HDL: 47 mg/dL (ref >39)
LDL Chol Calc (NIH): 84 mg/dL (ref 0–99)
Triglycerides: 143 mg/dL (ref 0–149)
VLDL Cholesterol Cal: 25 mg/dL (ref 5–40)

## 2019-04-27 MED ORDER — METOPROLOL TARTRATE 25 MG PO TABS: ORAL_TABLET | ORAL | 0 refills | Status: DC

## 2019-04-27 MED FILL — METOPROLOL TARTRATE 25 MG T: 25 | 1 days supply | Qty: 1 | Fill #0

## 2019-04-27 NOTE — Patient Instructions (Signed)
Medication Instructions:  Your Physician recommend you continue on your current medication as directed.    *If you need a refill on your cardiac medications before your next appointment, please call your pharmacy*  Lab Work: Your physician recommends that you return for lab work today (BMP, Lipid)  If you have labs (blood work) drawn today and your tests are completely normal, you will receive your results only by: Marland Kitchen MyChart Message (if you have MyChart) OR . A paper copy in the mail If you have any lab test that is abnormal or we need to change your treatment, we will call you to review the results.  Testing/Procedures: Your physician has requested that you have an echocardiogram. Echocardiography is a painless test that uses sound waves to create images of your heart. It provides your doctor with information about the size and shape of your heart and how well your heart's chambers and valves are working. This procedure takes approximately one hour. There are no restrictions for this procedure. [place] [address]   Non-Cardiac CT Angiography (CTA), is a special type of CT scan that uses a computer to produce multi-dimensional views of major blood vessels throughout the body. In CT angiography, a contrast material is injected through an IV to help visualize the blood vessels [facility]  Follow-Up: At [facility], you and your health needs are our priority.  As part of our continuing mission to provide you with exceptional heart care, we have created designated Provider Care Teams.  These Care Teams include your primary Cardiologist (physician) and Advanced Practice Providers (APPs -  Physician Assistants and Nurse Practitioners) who all work together to provide you with the care you need, when you need it.  Your next appointment:   2 months  The format for your next appointment:   In Person  Provider:   Buford Dresser, MD  Your cardiac CT will be  scheduled at one of the below locations:   [facility] [address] [place], [place] [address] [phone]  If scheduled at [facility], please arrive at the [facility] main entrance of [facility] 30-45 minutes prior to test start time. Proceed to the [facility] Radiology Department (first floor) to check-in and test prep.  If scheduled at [facility], please arrive 15 mins early for check-in and test prep.  Please follow these instructions carefully (unless otherwise directed):   On the Night Before the Test: . Be sure to Drink plenty of water. . Do not consume any caffeinated/decaffeinated beverages or chocolate 12 hours prior to your test. . Do not take any antihistamines 12 hours prior to your test.   On the Day of the Test: . Drink plenty of water. Do not drink any water within one hour of the test. . Do not eat any food 4 hours prior to the test. . You may take your regular medications prior to the test.  . Take metoprolol (Lopressor) two hours prior to test. . FEMALES- please wear underwire-free bra if available        After the Test: . Drink plenty of water. . After receiving IV contrast, you may experience a mild flushed feeling. This is normal. . On occasion, you may experience a mild rash up to 24 hours after the test. This is not dangerous. If this occurs, you can take Benadryl 25 mg and increase your fluid intake. . If you experience trouble breathing, this can be  serious. If it is severe call 911 IMMEDIATELY. If it is mild, please call our office. . If you take any of these medications: Glipizide/Metformin, Avandament, Glucavance, please do not take 48 hours after completing test unless otherwise instructed.   Once we have confirmed authorization from your insurance company, we will call you to set up a date and time for your test.   For non-scheduling related questions, please contact the cardiac  imaging nurse navigator should you have any questions/concerns: Marchia Bond, RN Navigator Cardiac Imaging [facility] [phone] Office

## 2019-04-27 NOTE — Progress Notes (Signed)
Cardiology Office Note:    Date:  04/27/2019   ID:  Tory, Lindenmuth [birth year], MRN [id number]  PCP:  Shon Baton, MD  Cardiologist:  Buford Dresser, MD  Referring MD: Shon Baton, MD   Chief Complaint  Patient presents with  . New Patient (Initial Visit)  . Shortness of Breath  . Chest Pain    Discomfort.    History of Present Illness:    Rachel Gaines is a 63 y.o. female with a hx of type II diabetes, obesity, arthritis, hyperlipidemia who is seen as a new consult at the request of Shon Baton, MD for the evaluation and management of chest pain.  She is also concerned about shortness of breath walking up the stairs. 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  Chest pain: -Initial onset: several months ago -Quality: left breast region, discomfort. Not too sharp, hard to describe. Does not radiate -Frequency: not in several weeks. At peak happening about 1x/week -Duration: several minutes -Associated symptoms: occasional shortness of breath, no nausea or diaphoresis -Aggravating/alleviating factors: worse with movement. Worse with spicy foods. Better with rest -Prior cardiac history: none -Prior workup: nuclear stress in 28-Sep-1999 normal. Not sure if she has had one since. -Prior treatment: none -Alcohol: none -Tobacco: never -Comorbidities: Has type II diabetes, since about 09-28-11. Denies hypertension personally, but is on ramipril. Denies kidney disease. BMI 30. -Exercise level: no routine exercise, but does daily activities without issue. -Cardiac ROS: no shortness of breath, no PND, no orthopnea, no LE edema, no syncope -Family history: father, sister had heart problems. Sister had PM, then ICD, had frequent shocks so they took out his ICD. Passed away in 09-28-2015 in her late 51s. Brother had unknown heart issues, had "dark legs." Another brother with unknown heart issues, has diabetes; she thinks he had stents placed.  Dyspnea on exertion: notices mostly when she walks up steps. Not  every day, but has been noticing more recently. Denies shortness of breath at rest. No PND, orthopnea, LE edema or unexpected weight gain. No syncope or palpitations.  Has been on crestor since about June, was on simvastatin prior. Hasn't had labs rechecked since starting this.   Mild cough wthout sputum for 2-3 weeks, covid neg.  Past Medical History:  Diagnosis Date  . Abnormal Pap smear of cervix 09/27/2012   ASCUS with negative HR HPV   . Anemia   . Cataract   . Diabetes mellitus without complication ([place])   . Hyperlipidemia    on medicine  . Plantar fasciitis   . 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 CATARACT EXTRACTION  09-27-2017  . COLONOSCOPY    . COLPOSCOPY W/ BIOPSY / CURETTAGE  02/04/1999   Chronic cervicitis    Current Medications: Current Outpatient Medications on File Prior to Visit  Medication Sig  . aspirin EC 81 MG tablet Take 81 mg by mouth daily.  Marland Kitchen b complex vitamins tablet Take 1 tablet by mouth daily.  Marland Kitchen BESIVANCE 0.6 % SUSP   . Biotin (BIOTIN 5000) 5 MG CAPS Take 1 capsule by mouth daily.  . canagliflozin (INVOKANA) 100 MG TABS tablet Take 100 mg by mouth.  . fluconazole (DIFLUCAN) 150 MG tablet Take 1 tablet and repeat in 72 hours  . gabapentin (NEURONTIN) 300 MG capsule Take 300 mg by mouth 3 (three) times daily. Only takes at night  . glimepiride (AMARYL) 4 MG tablet Take 4  mg by mouth daily with breakfast.  . LANTUS SOLOSTAR 100 UNIT/ML Solostar Pen Inject 34 Units into the skin 2 (two) times daily. If CBG <80 in AM, take only 20 units.   . Magnesium 250 MG TABS Take 250 mg by mouth daily. Reported on 09/19/2015  . Multiple Vitamin (MULTIVITAMIN) tablet Take 1 tablet by mouth daily. Reported on 09/19/2015  . NON FORMULARY [facility]  [place] Tendonitis Cream- Diclofenac 3%, Baclofen 2%, Bupivacaine 1%, Doxepin 5%, Gabapentin 6%, Ibuprofen 3%, Pentoxifylline 3% Apply 1-2 grams to  affected area 3-4 times daily Qty. 120 gm 3 refills  . NONFORMULARY OR COMPOUNDED ITEM [facility]:  Antiinflammatory cream - Diclofenac 3%, Baclofen 2%, Cyclobenzaprine 2%, Lidocaine 2%, dispense 120grams, apply 1-2 grams to affected area 3-4 times a day, +2refills.  . ramipril (ALTACE) 2.5 MG capsule Take 2.5 mg by mouth every other day.   . rosuvastatin (CRESTOR) 40 MG tablet Take 1 tablet by mouth daily.  . sitaGLIPtin (JANUVIA) 100 MG tablet Take 100 mg by mouth daily.  Marland Kitchen terconazole (TERAZOL 7) 0.4 % vaginal cream Place 1 applicator vaginally at bedtime.  . urea (GORDONS UREA) 40 % ointment Apply topically at bedtime. Apply to toenails  . valACYclovir (VALTREX) 1000 MG tablet Take 1/2 daily and then 1/2 tablet twice a day for 3-5 days for flare  . Vitamin D, Ergocalciferol, (DRISDOL) 50000 units CAPS capsule Take 1 capsule (50,000 Units total) by mouth every 7 (seven) days.   No current facility-administered medications on file prior to visit.      Allergies:   Penicillins   Social History   Tobacco Use  . Smoking status: Never Smoker  . Smokeless tobacco: Never Used  Substance Use Topics  . Alcohol use: No    Alcohol/week: 0.0 standard drinks  . Drug use: No    Family History: family history includes Cancer in her brother and father; Diabetes in her brother, brother, father, mother, sister, sister, sister, sister, sister, sister, sister, and sister; Heart disease in her father, sister, and sister; Heart failure in her father and sister; Lupus in her daughter; Thyroid disease in her sister. There is no history of Colon cancer, Colon polyps, Rectal cancer, Stomach cancer, or Esophageal cancer.  ROS:   Please see the history of present illness.  Additional pertinent ROS: Constitutional: Negative for chills, fever, night sweats, unintentional weight loss  HENT: Negative for ear pain and hearing loss.   Eyes: Negative for loss of vision and eye pain.  Respiratory: Mild  cough, sputum, no wheezing.   Cardiovascular: See HPI. Gastrointestinal: Negative for abdominal pain, melena, and hematochezia.  Genitourinary: Negative for dysuria and hematuria.  Musculoskeletal: Negative for falls and myalgias.  Skin: Negative for itching and rash.  Neurological: Negative for focal weakness, focal sensory changes and loss of consciousness.  Endo/Heme/Allergies: Does not bruise/bleed easily.     EKGs/Labs/Other Studies Reviewed:    The following studies were reviewed today: 02/24/2000 Nuclear stress FINDINGS CLINICAL DATA:  CHEST PAIN.  FAMILY HISTORY OF CAD. STRESS CARDIOLITE SPECT IMAGING: REST SPECT IMAGES WERE OBTAINED IN THREE PROJECTIONS FOLLOWING IV INJECTION OF 10 mCi 74mTc SESTAMIBI.  THE PATIENT EXERCISED ON THE TREADMILL ACCORDING TO THE BRUCE PROTOCOL FOR FIVE MINUTES, AND ACHIEVED A HEART RATE OF 175.   DURING PEAK EXERCISE, 30 mCi 5mTc SESTAMIBI WAS INJECTED, AND POST STRESS SPECT IMAGES WERE ACQUIRED.  NO REVERSIBLE OR FIXED PERFUSION DEFECTS ARE DETECTED. GATED SPECT WALL MOTION STUDY: LV WALL MOTION IS STUDIED IN THREE  PROJECTIONS.  WITH SYSTOLE THERE IS NORMAL LV WALL THICKENING AND COLOR INTENSIFICATION.  NO HYPOKINETIC SEGMENTS ARE NOTED. QUANTITATIVE GATED SPECT STUDY: EDV OF THE LV IS 71 CC.  ESV IS 70 CC.  RESTING LVEF IS 76%. IMPRESSION THE STUDY IS NEGATIVE FOR ISCHEMIA OR INFARCTION.  NORMAL LV WALL MOTION AT REST.  RESTING LVEF IS 76%.  EKG:  EKG is personally reviewed.  The ekg ordered today demonstrates NSR, poor R wave progression  Recent Labs: No results found for requested labs within last 8760 hours.  Recent Lipid Panel No results found for: CHOL, TRIG, HDL, CHOLHDL, VLDL, LDLCALC, LDLDIRECT  Physical Exam:    VS:  BP 118/70 (BP Location: Left Arm, Patient Position: Sitting, Cuff Size: Normal)   Pulse 66   Temp (!) 97 F (36.1 C)   Ht 5\' 4"  (1.626 m)   Wt 176 lb (79.8 kg)   LMP 06/28/2006 (Approximate)   BMI 30.21 kg/m      Wt Readings from Last 3 Encounters:  04/27/19 176 lb (79.8 kg)  12/08/18 168 lb (76.2 kg)  01/06/18 175 lb 6.4 oz (79.6 kg)    GEN: Well nourished, well developed in no acute distress HEENT: Normal, moist mucous membranes NECK: No JVD CARDIAC: regular rhythm, normal S1 and S2, no rubs or gallops. No murmurs. VASCULAR: Radial and DP pulses 2+ bilaterally. No carotid bruits RESPIRATORY:  Clear to auscultation without rales, wheezing or rhonchi  ABDOMEN: Soft, non-tender, non-distended MUSCULOSKELETAL:  Ambulates independently SKIN: Warm and dry, no edema NEUROLOGIC:  Alert and oriented x 3. No focal neuro deficits noted. PSYCHIATRIC:  Normal affect    ASSESSMENT:    1. Precordial pain   2. Pre-procedure lab exam   3. Medication management   4. DOE (dyspnea on exertion)   5. Diabetes mellitus type 2 in obese (HCC)   6. Cardiac risk counseling   7. Counseling on health promotion and disease prevention   8. Mixed hyperlipidemia    PLAN:    Chest pain, dyspnea on exertion: significant risk factors, including type II diabetes that has been difficult to control. -discussed treadmill stress, nuclear stress/lexiscan, and CT coronary angiography. Discussed pros and cons of each, including but not limited to false positive/false negative risk, radiation risk, and risk of IV contrast dye. Based on shared decision making, decision was made to pursue CT coronary angiography. -will give one time dose of metoprolol -counseled on need to get BMET prior to test, ordered today -counseled on use of sublingual nitroglycerin and its importance to a good test -will get echocardiogram as well given her shortness of breath and risk factors  Type II diabetes in obesity, managed by Dr. Virgina Jock -on ramipril for history of microalbuminuria -on empagliflozin, glimepiride, lantus insulin, sitagliptin -on aspirin, rosuvastatin  Mixed hyperlipidemia: has not been rechecked since starting rosuvastatin  -check today  Cardiac risk counseling and prevention recommendations: -recommend heart healthy/Mediterranean diet, with whole grains, fruits, vegetable, fish, lean meats, nuts, and olive oil. Limit salt. -recommend moderate walking, 3-5 times/week for 30-50 minutes each session. Aim for at least 150 minutes.week. Goal should be pace of 3 miles/hours, or walking 1.5 miles in 30 minutes -recommend avoidance of tobacco products. Avoid excess alcohol. -Additional risk factor control:  -Diabetes risk: A1c is 9.5  -Lipids: as above  -Blood pressure control: at goal  -Weight: BMI 30, just over the line of obesity -ASCVD risk score: The 10-year ASCVD risk score Mikey Bussing DC Brooke Bonito., et al., 2013) is: 13.6%  Values used to calculate the score:     Age: 24 years     Sex: Female     Is Non-Hispanic African American: Yes     Diabetic: Yes     Tobacco smoker: No     Systolic Blood Pressure: 123456 mmHg     Is BP treated: Yes     HDL Cholesterol: 47 mg/dL     Total Cholesterol: 156 mg/dL    Plan for follow up: 2 mos  Medication Adjustments/Labs and Tests Ordered: Current medicines are reviewed at length with the patient today.  Concerns regarding medicines are outlined above.  Orders Placed This Encounter  Procedures  . CT CORONARY MORPH W/CTA COR W/SCORE W/CA W/CM &/OR WO/CM  . CT CORONARY FRACTIONAL FLOW RESERVE DATA PREP  . CT CORONARY FRACTIONAL FLOW RESERVE FLUID ANALYSIS  . Basic metabolic panel  . Lipid panel  . EKG 12-Lead  . ECHOCARDIOGRAM COMPLETE   Meds ordered this encounter  Medications  . metoprolol tartrate (LOPRESSOR) 25 MG tablet    Sig: TAKE 1 TABLET 2 HR PRIOR TO CARDIAC PROCEDURE    Dispense:  1 tablet    Refill:  0    Patient Instructions  Medication Instructions:  Your Physician recommend you continue on your current medication as directed.    *If you need a refill on your cardiac medications before your next appointment, please call your pharmacy*  Lab Work: Your  physician recommends that you return for lab work today (BMP, Lipid)  If you have labs (blood work) drawn today and your tests are completely normal, you will receive your results only by: Marland Kitchen MyChart Message (if you have MyChart) OR . A paper copy in the mail If you have any lab test that is abnormal or we need to change your treatment, we will call you to review the results.  Testing/Procedures: Your physician has requested that you have an echocardiogram. Echocardiography is a painless test that uses sound waves to create images of your heart. It provides your doctor with information about the size and shape of your heart and how well your heart's chambers and valves are working. This procedure takes approximately one hour. There are no restrictions for this procedure. [address]   Non-Cardiac CT Angiography (CTA), is a special type of CT scan that uses a computer to produce multi-dimensional views of major blood vessels throughout the body. In CT angiography, a contrast material is injected through an IV to help visualize the blood vessels [facility]  Follow-Up: At [facility], you and your health needs are our priority.  As part of our continuing mission to provide you with exceptional heart care, we have created designated Provider Care Teams.  These Care Teams include your primary Cardiologist (physician) and Advanced Practice Providers (APPs -  Physician Assistants and Nurse Practitioners) who all work together to provide you with the care you need, when you need it.  Your next appointment:   2 months  The format for your next appointment:   In Person  Provider:   Buford Dresser, MD  Your cardiac CT will be scheduled at one of the below locations:   [facility] [address] [place], [place] [address] [phone]  If scheduled at [facility], please arrive at the [facility] main entrance of [facility]  30-45 minutes prior to test start time. Proceed to the [facility] Radiology Department (first floor) to check-in and test  prep.  If scheduled at [facility], please arrive 15 mins early for check-in and test prep.  Please follow these instructions carefully (unless otherwise directed):   On the Night Before the Test: . Be sure to Drink plenty of water. . Do not consume any caffeinated/decaffeinated beverages or chocolate 12 hours prior to your test. . Do not take any antihistamines 12 hours prior to your test.   On the Day of the Test: . Drink plenty of water. Do not drink any water within one hour of the test. . Do not eat any food 4 hours prior to the test. . You may take your regular medications prior to the test.  . Take metoprolol (Lopressor) two hours prior to test. . FEMALES- please wear underwire-free bra if available        After the Test: . Drink plenty of water. . After receiving IV contrast, you may experience a mild flushed feeling. This is normal. . On occasion, you may experience a mild rash up to 24 hours after the test. This is not dangerous. If this occurs, you can take Benadryl 25 mg and increase your fluid intake. . If you experience trouble breathing, this can be serious. If it is severe call 911 IMMEDIATELY. If it is mild, please call our office. . If you take any of these medications: Glipizide/Metformin, Avandament, Glucavance, please do not take 48 hours after completing test unless otherwise instructed.   Once we have confirmed authorization from your insurance company, we will call you to set up a date and time for your test.   For non-scheduling related questions, please contact the cardiac imaging nurse navigator should you have any questions/concerns: Marchia Bond, RN Navigator Cardiac Imaging [facility] [phone] Office       Signed, Buford Dresser, MD PhD 04/27/2019  [place]

## 2019-04-28 DIAGNOSIS — R072 Precordial pain: Secondary | ICD-10-CM | POA: Insufficient documentation

## 2019-04-28 DIAGNOSIS — E669 Obesity, unspecified: Secondary | ICD-10-CM | POA: Insufficient documentation

## 2019-04-28 DIAGNOSIS — R0609 Other forms of dyspnea: Secondary | ICD-10-CM | POA: Insufficient documentation

## 2019-04-28 DIAGNOSIS — E1169 Type 2 diabetes mellitus with other specified complication: Secondary | ICD-10-CM | POA: Insufficient documentation

## 2019-04-28 DIAGNOSIS — R0600 Dyspnea, unspecified: Secondary | ICD-10-CM | POA: Insufficient documentation

## 2019-05-04 ENCOUNTER — Other Ambulatory Visit: Payer: Self-pay

## 2019-05-04 ENCOUNTER — Encounter (INDEPENDENT_AMBULATORY_CARE_PROVIDER_SITE_OTHER): Payer: 59 | Admitting: Ophthalmology

## 2019-05-04 DIAGNOSIS — E113392 Type 2 diabetes mellitus with moderate nonproliferative diabetic retinopathy without macular edema, left eye: Secondary | ICD-10-CM

## 2019-05-04 DIAGNOSIS — H43813 Vitreous degeneration, bilateral: Secondary | ICD-10-CM | POA: Diagnosis not present

## 2019-05-04 DIAGNOSIS — E113311 Type 2 diabetes mellitus with moderate nonproliferative diabetic retinopathy with macular edema, right eye: Secondary | ICD-10-CM

## 2019-05-04 DIAGNOSIS — I10 Essential (primary) hypertension: Secondary | ICD-10-CM | POA: Diagnosis not present

## 2019-05-04 DIAGNOSIS — H35033 Hypertensive retinopathy, bilateral: Secondary | ICD-10-CM

## 2019-05-04 DIAGNOSIS — E11311 Type 2 diabetes mellitus with unspecified diabetic retinopathy with macular edema: Secondary | ICD-10-CM | POA: Diagnosis not present

## 2019-05-11 ENCOUNTER — Ambulatory Visit
Admission: RE | Admit: 2019-05-11 | Discharge: 2019-05-11 | Disposition: A | Discharge status: Home / Self Care | Payer: 59 | Source: Ambulatory Visit | Attending: Obstetrics & Gynecology | Admitting: Obstetrics & Gynecology

## 2019-05-11 ENCOUNTER — Ambulatory Visit (HOSPITAL_COMMUNITY): Payer: 59 | Attending: Cardiovascular Disease

## 2019-05-11 ENCOUNTER — Other Ambulatory Visit: Payer: Self-pay

## 2019-05-11 DIAGNOSIS — R0600 Dyspnea, unspecified: Secondary | ICD-10-CM | POA: Diagnosis not present

## 2019-05-11 DIAGNOSIS — Z1231 Encounter for screening mammogram for malignant neoplasm of breast: Secondary | ICD-10-CM

## 2019-05-11 DIAGNOSIS — R0609 Other forms of dyspnea: Secondary | ICD-10-CM

## 2019-05-11 MED FILL — LANTUS SOLOSTAR 100 UNITS/M: 100 | 30 days supply | Qty: 21 | Fill #1

## 2019-05-28 MED FILL — JANUVIA 100 MG TABLET: 100 | 30 days supply | Qty: 30 | Fill #6

## 2019-05-28 MED FILL — JARDIANCE 10 MG TABLET: 10 | 30 days supply | Qty: 30 | Fill #3

## 2019-05-29 ENCOUNTER — Telehealth: Payer: Self-pay | Admitting: Cardiology

## 2019-05-29 NOTE — Telephone Encounter (Signed)
New Message    Pt is calling to schedule her CT     Please call

## 2019-05-29 NOTE — Telephone Encounter (Signed)
Called patient, advised that the appointment for CT is scheduled by the hospital- they are behind due to [place], but hopefully she would be contacted soon. Patient verbalized understanding. Thankful for callback.

## 2019-06-01 ENCOUNTER — Other Ambulatory Visit: Payer: Self-pay

## 2019-06-01 ENCOUNTER — Encounter (INDEPENDENT_AMBULATORY_CARE_PROVIDER_SITE_OTHER): Payer: 59 | Admitting: Ophthalmology

## 2019-06-01 DIAGNOSIS — E113213 Type 2 diabetes mellitus with mild nonproliferative diabetic retinopathy with macular edema, bilateral: Secondary | ICD-10-CM

## 2019-06-01 DIAGNOSIS — E11311 Type 2 diabetes mellitus with unspecified diabetic retinopathy with macular edema: Secondary | ICD-10-CM | POA: Diagnosis not present

## 2019-06-01 DIAGNOSIS — H35033 Hypertensive retinopathy, bilateral: Secondary | ICD-10-CM | POA: Diagnosis not present

## 2019-06-01 DIAGNOSIS — H43813 Vitreous degeneration, bilateral: Secondary | ICD-10-CM | POA: Diagnosis not present

## 2019-06-01 DIAGNOSIS — I10 Essential (primary) hypertension: Secondary | ICD-10-CM | POA: Diagnosis not present

## 2019-06-11 MED FILL — LANTUS SOLOSTAR 100 UNITS/M: 100 | 30 days supply | Qty: 21 | Fill #3

## 2019-06-18 ENCOUNTER — Other Ambulatory Visit: Payer: Self-pay | Admitting: Cardiology

## 2019-06-18 ENCOUNTER — Telehealth: Payer: Self-pay

## 2019-06-18 DIAGNOSIS — Z01812 Encounter for preprocedural laboratory examination: Secondary | ICD-10-CM | POA: Diagnosis not present

## 2019-06-18 MED ORDER — METOPROLOL TARTRATE 25 MG PO TABS: ORAL_TABLET | ORAL | 0 refills | Status: DC

## 2019-06-18 MED FILL — METOPROLOL TARTRATE 25 MG T: 25 | 1 days supply | Qty: 1 | Fill #0

## 2019-06-18 NOTE — Telephone Encounter (Signed)
Patient calling stating she was given 1 metoprolol tartrate (LOPRESSOR) 25 MG tablet for her CT on 12/24. She states she misplaced it and asked for a refill, but it was denied.

## 2019-06-18 NOTE — Telephone Encounter (Signed)
Pt came to office for labs for pre procedure CTA, pt had test BMET on 04/17/19 and was not in the 30 day window prior to test. Order has been place.

## 2019-06-19 ENCOUNTER — Encounter (HOSPITAL_COMMUNITY): Payer: Self-pay

## 2019-06-19 LAB — BASIC METABOLIC PANEL
BUN/Creatinine Ratio: 14 (ref 12–28)
BUN: 10 mg/dL (ref 8–27)
CO2: 24 mmol/L (ref 20–29)
Calcium: 10.2 mg/dL (ref 8.7–10.3)
Chloride: 105 mmol/L (ref 96–106)
Creatinine, Ser: 0.71 mg/dL (ref 0.57–1.00)
GFR calc Af Amer: 104 mL/min/{1.73_m2} (ref >59)
GFR calc non Af Amer: 90 mL/min/{1.73_m2} (ref >59)
Glucose: 119 mg/dL — ABNORMAL HIGH (ref 65–99)
Potassium: 4 mmol/L (ref 3.5–5.2)
Sodium: 142 mmol/L (ref 134–144)

## 2019-06-20 ENCOUNTER — Telehealth (HOSPITAL_COMMUNITY): Payer: Self-pay | Admitting: Emergency Medicine

## 2019-06-20 NOTE — Telephone Encounter (Signed)
Reaching out to patient to offer assistance regarding upcoming cardiac imaging study; pt verbalizes understanding of appt date/time, parking situation and where to check in, pre-test NPO status and medications ordered, and verified current allergies; name and call back number provided for further questions should they arise Trenell Moxey RN Navigator Cardiac Imaging [facility] [phone] office [phone] cell 

## 2019-06-21 ENCOUNTER — Other Ambulatory Visit: Payer: Self-pay

## 2019-06-21 ENCOUNTER — Ambulatory Visit (HOSPITAL_COMMUNITY)
Admission: RE | Admit: 2019-06-21 | Discharge: 2019-06-21 | Disposition: A | Discharge status: Home / Self Care | Payer: 59 | Source: Ambulatory Visit | Attending: Cardiology | Admitting: Cardiology

## 2019-06-21 DIAGNOSIS — R072 Precordial pain: Secondary | ICD-10-CM | POA: Insufficient documentation

## 2019-06-21 MED ORDER — IOHEXOL 350 MG/ML SOLN
80.0000 mL | Freq: Once | INTRAVENOUS | Status: AC | PRN
  Administered 2019-06-21: 80 mL via INTRAVENOUS

## 2019-06-21 MED ORDER — NITROGLYCERIN 0.4 MG SL SUBL
SUBLINGUAL_TABLET | SUBLINGUAL | Status: AC
  Filled 2019-06-21: qty 2

## 2019-06-21 MED ORDER — NITROGLYCERIN 0.4 MG SL SUBL
0.8000 mg | SUBLINGUAL_TABLET | Freq: Once | SUBLINGUAL | Status: AC
  Administered 2019-06-21: 0.8 mg via SUBLINGUAL

## 2019-06-28 DIAGNOSIS — B351 Tinea unguium: Secondary | ICD-10-CM | POA: Diagnosis not present

## 2019-06-28 DIAGNOSIS — M25774 Osteophyte, right foot: Secondary | ICD-10-CM | POA: Diagnosis not present

## 2019-06-28 DIAGNOSIS — M25775 Osteophyte, left foot: Secondary | ICD-10-CM | POA: Diagnosis not present

## 2019-07-02 ENCOUNTER — Encounter: Payer: Self-pay | Admitting: Cardiology

## 2019-07-02 ENCOUNTER — Other Ambulatory Visit: Payer: Self-pay

## 2019-07-02 ENCOUNTER — Ambulatory Visit (INDEPENDENT_AMBULATORY_CARE_PROVIDER_SITE_OTHER): Payer: 59 | Admitting: Cardiology

## 2019-07-02 VITALS — BP 132/62 | HR 62 | Ht 64.0 in | Wt 171.6 lb

## 2019-07-02 DIAGNOSIS — R0600 Dyspnea, unspecified: Secondary | ICD-10-CM

## 2019-07-02 DIAGNOSIS — E782 Mixed hyperlipidemia: Secondary | ICD-10-CM

## 2019-07-02 DIAGNOSIS — Z712 Person consulting for explanation of examination or test findings: Secondary | ICD-10-CM

## 2019-07-02 DIAGNOSIS — R0609 Other forms of dyspnea: Secondary | ICD-10-CM

## 2019-07-02 DIAGNOSIS — Z7189 Other specified counseling: Secondary | ICD-10-CM

## 2019-07-02 DIAGNOSIS — R072 Precordial pain: Secondary | ICD-10-CM | POA: Diagnosis not present

## 2019-07-02 NOTE — Progress Notes (Signed)
Cardiology Office Note:    Date:  07/02/2019   ID:  Rachel Gaines, DOB [birth year], MRN [id number]  PCP:  Shon Baton, MD  Cardiologist:  Buford Dresser, MD  Referring MD: Shon Baton, MD   CC: follow up  History of Present Illness:    Rachel Gaines is a 64 y.o. female with a hx of type II diabetes, obesity, arthritis, hyperlipidemia who is seen for follow up. I initially saw her 04/27/19 as a new consult at the request of Shon Baton, MD for the evaluation and management of chest pain.  Today: Reviewed test results today, both cardiac CT and echo. We reviewed these together at length. Spoke that the findings are very positive, suggest that her chest pain and shortness of breath are unlikely to be caused by issues with her heart. She is relieved by this.   Reviewed medications, discussed prevention strategy. She is tolerating all of her medications and is on an excellent prevention strategy.  Still having intermittent chest discomfort, 1-2 times/week. Cut back on spicy foods, which helped. Still has mild dyspnea on exertion, which is worse when she is wearing her mask. Able to climb stairs with groceries, though feels tired at the top of the stairs.   Denies shortness of breath at rest. No PND, orthopnea, LE edema or unexpected weight gain. No syncope or palpitations.  Has had rare dark stools occasionally, sometimes looks "thick" but not sticky. Takes supplements but no iron. Did start taking turmeric for arthritis.    Past Medical History:  Diagnosis Date  . Abnormal Pap smear of cervix 2014   ASCUS with negative HR HPV   . Anemia   . Cataract   . Diabetes mellitus without complication ([place])   . Hyperlipidemia    on medicine  . Plantar fasciitis   . 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 CATARACT EXTRACTION  2019  . COLONOSCOPY    . COLPOSCOPY W/ BIOPSY / CURETTAGE  02/04/1999   Chronic cervicitis     Current Medications: Current Outpatient Medications on File Prior to Visit  Medication Sig  . aspirin EC 81 MG tablet Take 81 mg by mouth daily.  . Biotin (BIOTIN 5000) 5 MG CAPS Take 1 capsule by mouth daily.  . empagliflozin (JARDIANCE) 10 MG TABS tablet Take 10 mg by mouth daily.  . fluconazole (DIFLUCAN) 150 MG tablet Take 1 tablet and repeat in 72 hours  . gabapentin (NEURONTIN) 300 MG capsule Take 300 mg by mouth 3 (three) times daily. Only takes at night  . glimepiride (AMARYL) 4 MG tablet Take 4 mg by mouth daily with breakfast.  . LANTUS SOLOSTAR 100 UNIT/ML Solostar Pen Inject 34 Units into the skin 2 (two) times daily. If CBG <80 in AM, take only 20 units.   . metoprolol tartrate (LOPRESSOR) 25 MG tablet TAKE 1 TABLET 2 HR PRIOR TO CARDIAC PROCEDURE  . NONFORMULARY OR COMPOUNDED ITEM [facility]:  Antiinflammatory cream - Diclofenac 3%, Baclofen 2%, Cyclobenzaprine 2%, Lidocaine 2%, dispense 120grams, apply 1-2 grams to affected area 3-4 times a day, +2refills.  . ramipril (ALTACE) 2.5 MG capsule Take 2.5 mg by mouth every other day.   . rosuvastatin (CRESTOR) 40 MG tablet Take 1 tablet by mouth daily.  . sitaGLIPtin (JANUVIA) 100 MG tablet Take 100 mg by mouth daily.  Marland Kitchen terconazole (TERAZOL 7) 0.4 % vaginal cream Place 1 applicator vaginally at bedtime. (Patient  taking differently: Place 1 applicator vaginally as needed. )  . valACYclovir (VALTREX) 1000 MG tablet Take 1/2 daily and then 1/2 tablet twice a day for 3-5 days for flare  . Vitamin D, Ergocalciferol, (DRISDOL) 50000 units CAPS capsule Take 1 capsule (50,000 Units total) by mouth every 7 (seven) days.   No current facility-administered medications on file prior to visit.     Allergies:   Penicillins   Social History   Tobacco Use  . Smoking status: Never Smoker  . Smokeless tobacco: Never Used  Substance Use Topics  . Alcohol use: No    Alcohol/week: 0.0 standard drinks  . Drug use: No    Family  History: family history includes Cancer in her brother and father; Diabetes in her brother, brother, father, mother, sister, sister, sister, sister, sister, sister, sister, and sister; Heart disease in her father, sister, and sister; Heart failure in her father and sister; Lupus in her daughter; Thyroid disease in her sister. There is no history of Colon cancer, Colon polyps, Rectal cancer, Stomach cancer, or Esophageal cancer.  father, sister had heart problems. Sister had PM, then ICD, had frequent shocks so they took out his ICD. Passed away in 10-11-15 in her late 58s. Brother had unknown heart issues, had "dark legs." Another brother with unknown heart issues, has diabetes; she thinks he had stents placed.  ROS:   Please see the history of present illness.  Additional pertinent ROS: Constitutional: Negative for chills, fever, night sweats, unintentional weight loss  HENT: Negative for ear pain and hearing loss.   Eyes: Negative for loss of vision and eye pain.  Respiratory: Negative for cough, sputum, wheezing.   Cardiovascular: See HPI. Gastrointestinal: Negative for abdominal pain, melena, and hematochezia. See HPI re: dark stools. Genitourinary: Negative for dysuria and hematuria.  Musculoskeletal: Negative for falls and myalgias.  Skin: Negative for itching and rash.  Neurological: Negative for focal weakness, focal sensory changes and loss of consciousness.  Endo/Heme/Allergies: Does not bruise/bleed easily.    EKGs/Labs/Other Studies Reviewed:    The following studies were reviewed today: Cardiac CT 06/21/19 1. No evidence of CAD, CADRADS = 0. 2. Coronary calcium score of 0. This was 0 percentile for age and sex matched control. 3. Normal coronary origin with right dominance.  Echo 05/11/19  1. Left ventricular ejection fraction, by visual estimation, is 60 to 65%. The left ventricle has normal function. There is no left ventricular hypertrophy.  2. The left ventricle has no  regional wall motion abnormalities.  3. LVEF by 3D assessment 63%.  4. Global right ventricle has normal systolic function.The right ventricular size is normal. No increase in right ventricular wall thickness.  5. Left atrial size was normal.  6. Right atrial size was normal.  7. Presence of pericardial fat pad.  8. Trivial pericardial effusion is present.  9. The mitral valve is grossly normal. No evidence of mitral valve regurgitation. 10. The tricuspid valve is grossly normal. Tricuspid valve regurgitation is not demonstrated. 11. The aortic valve is tricuspid. Aortic valve regurgitation is not visualized. No evidence of aortic valve sclerosis or stenosis. 12. The pulmonic valve was grossly normal. Pulmonic valve regurgitation is not visualized. 13. The average left ventricular global longitudinal strain is -21.7 %.  02/24/2000 Nuclear stress FINDINGS CLINICAL DATA:  CHEST PAIN.  FAMILY HISTORY OF CAD. STRESS CARDIOLITE SPECT IMAGING: REST SPECT IMAGES WERE OBTAINED IN THREE PROJECTIONS FOLLOWING IV INJECTION OF 10 mCi 78mTc SESTAMIBI.  THE PATIENT EXERCISED ON THE  TREADMILL ACCORDING TO THE BRUCE PROTOCOL FOR FIVE MINUTES, AND ACHIEVED A HEART RATE OF 175.   DURING PEAK EXERCISE, 30 mCi 29mTc SESTAMIBI WAS INJECTED, AND POST STRESS SPECT IMAGES WERE ACQUIRED.  NO REVERSIBLE OR FIXED PERFUSION DEFECTS ARE DETECTED. GATED SPECT WALL MOTION STUDY: LV WALL MOTION IS STUDIED IN THREE PROJECTIONS.  WITH SYSTOLE THERE IS NORMAL LV WALL THICKENING AND COLOR INTENSIFICATION.  NO HYPOKINETIC SEGMENTS ARE NOTED. QUANTITATIVE GATED SPECT STUDY: EDV OF THE LV IS 71 CC.  ESV IS 70 CC.  RESTING LVEF IS 76%. IMPRESSION THE STUDY IS NEGATIVE FOR ISCHEMIA OR INFARCTION.  NORMAL LV WALL MOTION AT REST.  RESTING LVEF IS 76%.  EKG:  EKG is personally reviewed.  The ekg ordered 04/26/29 demonstrates NSR, poor R wave progression  Recent Labs: 06/18/2019: BUN 10; Creatinine, Ser 0.71; Potassium 4.0; Sodium  142  Recent Lipid Panel    Component Value Date/Time   CHOL 156 04/27/2019 1033   TRIG 143 04/27/2019 1033   HDL 47 04/27/2019 1033   CHOLHDL 3.3 04/27/2019 1033   LDLCALC 84 04/27/2019 1033    Physical Exam:    VS:  BP 132/62   Pulse 62   Ht 5\' 4"  (1.626 m)   Wt 171 lb 9.6 oz (77.8 kg)   LMP 06/28/2006 (Approximate)   SpO2 94%   BMI 29.46 kg/m     Wt Readings from Last 3 Encounters:  07/02/19 171 lb 9.6 oz (77.8 kg)  04/27/19 176 lb (79.8 kg)  12/08/18 168 lb (76.2 kg)    GEN: Well nourished, well developed in no acute distress HEENT: Normal, moist mucous membranes NECK: No JVD CARDIAC: regular rhythm, normal S1 and S2, no rubs or gallops. No murmur. VASCULAR: Radial and DP pulses 2+ bilaterally. No carotid bruits RESPIRATORY:  Clear to auscultation without rales, wheezing or rhonchi  ABDOMEN: Soft, non-tender, non-distended MUSCULOSKELETAL:  Ambulates independently SKIN: Warm and dry, no edema NEUROLOGIC:  Alert and oriented x 3. No focal neuro deficits noted. PSYCHIATRIC:  Normal affect   ASSESSMENT:    1. Encounter to discuss test results   2. Precordial pain   3. DOE (dyspnea on exertion)   4. Cardiac risk counseling   5. Mixed hyperlipidemia   6. Counseling on health promotion and disease prevention    PLAN:    Chest pain, dyspnea on exertion: both cardiac CT and echo personally reviewed with her, >25 minutes of test discussion, prevention strategies, long term risk scoring -highly suggests that her symptoms are not cardiac related -encouraged her to continue to try to exercise, etc to increase her conditioning level  Type II diabetes in obesity, managed by Dr. Virgina Jock -on ramipril for history of microalbuminuria -on empagliflozin, glimepiride, lantus insulin, sitagliptin -on aspirin, rosuvastatin  Mixed hyperlipidemia:  -lipids checked 04/27/19, LDL 84  Cardiac risk counseling and prevention recommendations: -recommend heart healthy/Mediterranean  diet, with whole grains, fruits, vegetable, fish, lean meats, nuts, and olive oil. Limit salt. -recommend moderate walking, 3-5 times/week for 30-50 minutes each session. Aim for at least 150 minutes.week. Goal should be pace of 3 miles/hours, or walking 1.5 miles in 30 minutes -recommend avoidance of tobacco products. Avoid excess alcohol. -ASCVD risk score: The 10-year ASCVD risk score Mikey Bussing DC Brooke Bonito., et al., 2013) is: 17.7%   Values used to calculate the score:     Age: 59 years     Sex: Female     Is Non-Hispanic African American: Yes     Diabetic: Yes  Tobacco smoker: No     Systolic Blood Pressure: [id number] mmHg     Is BP treated: Yes     HDL Cholesterol: 47 mg/dL     Total Cholesterol: 156 mg/dL    Plan for follow up: 1 year or sooner PRN  TIME SPENT WITH PATIENT: 30 minutes of direct patient care. More than 50% of that time was spent on coordination of care and counseling regarding test results, long term prevention and risk management.  Buford Dresser, MD, PhD [place]  [facility]   Medication Adjustments/Labs and Tests Ordered: Current medicines are reviewed at length with the patient today.  Concerns regarding medicines are outlined above.  No orders of the defined types were placed in this encounter.  No orders of the defined types were placed in this encounter.   Patient Instructions  Medication Instructions:  Your Physician recommend you continue on your current medication as directed.    *If you need a refill on your cardiac medications before your next appointment, please call your pharmacy*  Lab Work: None  Testing/Procedures: None  Follow-Up: At [facility], you and your health needs are our priority.  As part of our continuing mission to provide you with exceptional heart care, we have created designated Provider Care Teams.  These Care Teams include your primary Cardiologist (physician) and Advanced Practice Providers (APPs -  Physician  Assistants and Nurse Practitioners) who all work together to provide you with the care you need, when you need it.  Your next appointment:   1 year(s)  The format for your next appointment:   In Person  Provider:   Buford Dresser, MD     Signed, Buford Dresser, MD PhD 07/02/2019  Protection

## 2019-07-02 NOTE — Patient Instructions (Signed)

## 2019-07-03 ENCOUNTER — Telehealth: Payer: Self-pay | Admitting: Obstetrics & Gynecology

## 2019-07-03 ENCOUNTER — Other Ambulatory Visit: Payer: Self-pay | Admitting: Obstetrics & Gynecology

## 2019-07-03 NOTE — Telephone Encounter (Signed)
Spoke with patient. Patient reports external vaginal itching, requesting RX for terazol. Denies vag d/c, odor, bleeding. Patient is diabetic. Was seen in office on 12/08/18 for vag itching, vaginitis testing neg. Was prescribed terazol 7 to apply externally, this helped with itching.   Advised patient she could try OTC monistat externally or coconut oil, patient declines. Offered OV, patient declined for now.   Last AEX 01/06/18. Pap neg, hov neg. Hx of ASCUS pap 12/19/12, neg hpv. No AEX scheduled. Advised AEX recommended yearly for Well Woman Exam to include breast exam, menopause issues, pelvic exam and bone health to name a few. Patient states she is at work, will return call to schedule AEX at later date.    1. Patient asking if RX for Terazol 7 can be sent to pharmacy on file?   2. Patient asking when next PAP due?   Advised I will review with Dr. Sabra Heck and return call with recommendations. Patient agreeable.   Dr. Sabra Heck -please advise

## 2019-07-03 NOTE — Telephone Encounter (Signed)
Terazol 7 sent to pharmacy.  If symptoms do not resolve, needs OV.  Pap and HR HPV both negative 2019.  By current guidelines, repeat 5 years from that date.

## 2019-07-03 NOTE — Telephone Encounter (Signed)
Call placed to patient, left detailed message, ok per dpr. Advised as seen below per Dr. Sabra Heck. Return call to office if any additional questions.   Encounter closed.

## 2019-07-03 NOTE — Telephone Encounter (Signed)
Patient requesting refill on terazol cream. States she doesn't think she has a yeast infection, just having some itching. Denied OV. Would also like to know if she needs to have a pap this year. Last pap done at [facility] 01/06/2018.

## 2019-07-05 ENCOUNTER — Other Ambulatory Visit (HOSPITAL_COMMUNITY): Payer: Self-pay | Admitting: Internal Medicine

## 2019-07-05 ENCOUNTER — Telehealth: Payer: Self-pay | Admitting: *Deleted

## 2019-07-05 MED FILL — JANUVIA 100 MG TABLET: 100 | 60 days supply | Qty: 60 | Fill #0

## 2019-07-05 MED FILL — GLIMEPIRIDE 4 MG TABLET: 4 | 90 days supply | Qty: 90 | Fill #2

## 2019-07-05 MED FILL — JARDIANCE 10 MG TABLET: 10 | 30 days supply | Qty: 30 | Fill #4

## 2019-07-05 NOTE — Telephone Encounter (Signed)
Pt states she was to have an appt with Dr. Prudence Davidson and was scheduled in March with Dr. Jacqualyn Posey, and she went to see Dr. Gershon Mussel and was told she has an infection. I told pt that we last saw her in June and she could have gotten an infection between then and now, I would have one of the schedulers call to see if that can get her in earlier then March, we sometimes get cancellations.

## 2019-07-09 MED FILL — LANTUS SOLOSTAR 100 UNITS/M: 100 | 30 days supply | Qty: 21 | Fill #2

## 2019-07-10 MED FILL — JARDIANCE 10 MG TABLET: 10 | 30 days supply | Qty: 30 | Fill #4

## 2019-07-20 ENCOUNTER — Encounter (INDEPENDENT_AMBULATORY_CARE_PROVIDER_SITE_OTHER): Payer: 59 | Admitting: Ophthalmology

## 2019-07-20 ENCOUNTER — Other Ambulatory Visit: Payer: Self-pay

## 2019-08-02 DIAGNOSIS — M1990 Unspecified osteoarthritis, unspecified site: Secondary | ICD-10-CM | POA: Diagnosis not present

## 2019-08-02 DIAGNOSIS — M25552 Pain in left hip: Secondary | ICD-10-CM | POA: Diagnosis not present

## 2019-08-02 DIAGNOSIS — R079 Chest pain, unspecified: Secondary | ICD-10-CM | POA: Diagnosis not present

## 2019-08-02 DIAGNOSIS — M48061 Spinal stenosis, lumbar region without neurogenic claudication: Secondary | ICD-10-CM | POA: Diagnosis not present

## 2019-08-02 DIAGNOSIS — E1165 Type 2 diabetes mellitus with hyperglycemia: Secondary | ICD-10-CM | POA: Diagnosis not present

## 2019-08-02 DIAGNOSIS — Z794 Long term (current) use of insulin: Secondary | ICD-10-CM | POA: Diagnosis not present

## 2019-08-02 DIAGNOSIS — E11319 Type 2 diabetes mellitus with unspecified diabetic retinopathy without macular edema: Secondary | ICD-10-CM | POA: Diagnosis not present

## 2019-08-02 DIAGNOSIS — E669 Obesity, unspecified: Secondary | ICD-10-CM | POA: Diagnosis not present

## 2019-08-14 MED FILL — ROSUVASTATIN CALCIUM 40 MG: 40 | 90 days supply | Qty: 90 | Fill #2

## 2019-08-14 MED FILL — LANTUS SOLOSTAR 100 UNITS/M: 100 | 30 days supply | Qty: 21 | Fill #3

## 2019-08-14 MED FILL — JARDIANCE 10 MG TABLET: 10 | 30 days supply | Qty: 30 | Fill #5

## 2019-08-31 ENCOUNTER — Other Ambulatory Visit: Payer: Self-pay

## 2019-08-31 ENCOUNTER — Encounter: Payer: Self-pay | Admitting: Podiatry

## 2019-08-31 ENCOUNTER — Ambulatory Visit (INDEPENDENT_AMBULATORY_CARE_PROVIDER_SITE_OTHER): Payer: 59 | Admitting: Podiatry

## 2019-08-31 VITALS — Temp 97.1°F

## 2019-08-31 DIAGNOSIS — B351 Tinea unguium: Secondary | ICD-10-CM

## 2019-08-31 DIAGNOSIS — E119 Type 2 diabetes mellitus without complications: Secondary | ICD-10-CM

## 2019-08-31 NOTE — Progress Notes (Signed)
Complaint:  Visit Type: Patient returns to my office for continued preventative foot care services. Complaint: Patient states" my nails have grown long and thick and become painful to walk and wear shoes" Patient has been diagnosed with DM with no foot complications. The patient presents for preventative foot care services.  Podiatric Exam: Vascular: dorsalis pedis and posterior tibial pulses are palpable bilateral. Capillary return is immediate. Temperature gradient is WNL. Skin turgor WNL  Sensorium: Normal Semmes Weinstein monofilament test. Normal tactile sensation bilaterally. Nail Exam: Pt has thick disfigured discolored nails with subungual debris noted bilateral entire nail hallux through fifth toenails Ulcer Exam: There is no evidence of ulcer or pre-ulcerative changes or infection. Orthopedic Exam: Muscle tone and strength are WNL. No limitations in general ROM. No crepitus or effusions noted. Foot type and digits show no abnormalities.HAV  B/L.   Skin: No Porokeratosis. No infection or ulcers  Diagnosis:  Onychomycosis, , Pain in right toe, pain in left toes  Treatment & Plan Procedures and Treatment: Consent by patient was obtained for treatment procedures.   Debridement of mycotic and hypertrophic toenails, 1 through 5 bilateral and clearing of subungual debris. No ulceration, no infection noted.  Return Visit-Office Procedure: Patient instructed to return to the office for a follow up visit 3 months for continued evaluation and treatment.    Gardiner Barefoot DPM

## 2019-09-24 DIAGNOSIS — E1165 Type 2 diabetes mellitus with hyperglycemia: Secondary | ICD-10-CM | POA: Diagnosis not present

## 2019-09-24 MED FILL — LANTUS SOLOSTAR 100 UNITS/M: 100 | 30 days supply | Qty: 21 | Fill #4

## 2019-09-24 MED FILL — HUMALOG 100 UNITS/ML KWIKPE: 100 | 80 days supply | Qty: 12 | Fill #0

## 2019-09-24 MED FILL — JARDIANCE 10 MG TABLET: 10 | 30 days supply | Qty: 30 | Fill #6

## 2019-09-24 MED FILL — UNIFINE PENTIPS 32GX5/32: 32G X 4 MM | 75 days supply | Qty: 300 | Fill #0

## 2019-09-24 MED FILL — RAMIPRIL 2.5 MG CAPSULE: 2.5 | 90 days supply | Qty: 45 | Fill #1

## 2019-09-24 MED FILL — JANUVIA 100 MG TABLET: 100 | 60 days supply | Qty: 60 | Fill #1

## 2019-10-19 DIAGNOSIS — Z961 Presence of intraocular lens: Secondary | ICD-10-CM | POA: Diagnosis not present

## 2019-10-19 DIAGNOSIS — E113291 Type 2 diabetes mellitus with mild nonproliferative diabetic retinopathy without macular edema, right eye: Secondary | ICD-10-CM | POA: Diagnosis not present

## 2019-10-19 DIAGNOSIS — H40013 Open angle with borderline findings, low risk, bilateral: Secondary | ICD-10-CM | POA: Diagnosis not present

## 2019-10-19 DIAGNOSIS — E113212 Type 2 diabetes mellitus with mild nonproliferative diabetic retinopathy with macular edema, left eye: Secondary | ICD-10-CM | POA: Diagnosis not present

## 2019-10-25 ENCOUNTER — Other Ambulatory Visit (HOSPITAL_COMMUNITY): Payer: Self-pay | Admitting: Internal Medicine

## 2019-10-25 DIAGNOSIS — Z794 Long term (current) use of insulin: Secondary | ICD-10-CM | POA: Diagnosis not present

## 2019-10-25 DIAGNOSIS — E1165 Type 2 diabetes mellitus with hyperglycemia: Secondary | ICD-10-CM | POA: Diagnosis not present

## 2019-10-26 ENCOUNTER — Other Ambulatory Visit (HOSPITAL_COMMUNITY): Payer: Self-pay | Admitting: Internal Medicine

## 2019-10-26 MED FILL — BASAGLAR 100 UNIT/ML KWIKPE: 100 | 22 days supply | Qty: 15 | Fill #0

## 2019-10-29 MED FILL — FLUCONAZOLE 150 MG TABLET: 150 | 2 days supply | Qty: 1 | Fill #1

## 2019-10-29 MED FILL — JARDIANCE 10 MG TABLET: 10 | 30 days supply | Qty: 30 | Fill #7

## 2019-11-22 DIAGNOSIS — E1165 Type 2 diabetes mellitus with hyperglycemia: Secondary | ICD-10-CM | POA: Diagnosis not present

## 2019-11-22 DIAGNOSIS — E7849 Other hyperlipidemia: Secondary | ICD-10-CM | POA: Diagnosis not present

## 2019-11-22 DIAGNOSIS — E559 Vitamin D deficiency, unspecified: Secondary | ICD-10-CM | POA: Diagnosis not present

## 2019-11-22 DIAGNOSIS — Z0000 Encounter for general adult medical examination without abnormal findings: Secondary | ICD-10-CM | POA: Diagnosis not present

## 2019-11-23 MED FILL — BASAGLAR 100 UNIT/ML KWIKPE: 100 | 22 days supply | Qty: 15 | Fill #1

## 2019-11-27 ENCOUNTER — Other Ambulatory Visit (HOSPITAL_COMMUNITY): Payer: Self-pay | Admitting: Internal Medicine

## 2019-11-27 ENCOUNTER — Other Ambulatory Visit: Payer: Self-pay | Admitting: Obstetrics & Gynecology

## 2019-11-27 MED FILL — JANUVIA 100 MG TABLET: 100 | 30 days supply | Qty: 30 | Fill #2

## 2019-11-27 MED FILL — GABAPENTIN 300 MG CAPSULE: 300 | 90 days supply | Qty: 270 | Fill #1

## 2019-11-27 MED FILL — ROSUVASTATIN CALCIUM 40 MG: 40 | 90 days supply | Qty: 90 | Fill #0

## 2019-11-27 MED FILL — valACYclovir HCL 1 GM TABS: 1 | 60 days supply | Qty: 45 | Fill #0

## 2019-11-27 NOTE — Telephone Encounter (Signed)
Medication refill request: Valtrex Last AEX:  01-06-18 SM  Next AEX: message left for patient to call and schedule  Last MMG (if hormonal medication request): 05-11-2019 density B/BIRADS 1 negative  Refill authorized: Today, please advise.   Medication pended for #45, 0RF. Please refill if appropriate.

## 2019-11-29 DIAGNOSIS — E1165 Type 2 diabetes mellitus with hyperglycemia: Secondary | ICD-10-CM | POA: Diagnosis not present

## 2019-11-29 DIAGNOSIS — E7849 Other hyperlipidemia: Secondary | ICD-10-CM | POA: Diagnosis not present

## 2019-11-29 DIAGNOSIS — R5383 Other fatigue: Secondary | ICD-10-CM | POA: Diagnosis not present

## 2019-11-29 DIAGNOSIS — E559 Vitamin D deficiency, unspecified: Secondary | ICD-10-CM | POA: Diagnosis not present

## 2019-11-29 DIAGNOSIS — Z0000 Encounter for general adult medical examination without abnormal findings: Secondary | ICD-10-CM | POA: Diagnosis not present

## 2019-11-29 DIAGNOSIS — E11319 Type 2 diabetes mellitus with unspecified diabetic retinopathy without macular edema: Secondary | ICD-10-CM | POA: Diagnosis not present

## 2019-11-29 DIAGNOSIS — Z794 Long term (current) use of insulin: Secondary | ICD-10-CM | POA: Diagnosis not present

## 2019-11-29 DIAGNOSIS — G629 Polyneuropathy, unspecified: Secondary | ICD-10-CM | POA: Diagnosis not present

## 2019-11-29 DIAGNOSIS — E669 Obesity, unspecified: Secondary | ICD-10-CM | POA: Diagnosis not present

## 2019-11-29 MED FILL — HUMALOG 100 UNITS/ML KWIKPE: 100 | 80 days supply | Qty: 12 | Fill #0

## 2019-12-07 ENCOUNTER — Other Ambulatory Visit: Payer: Self-pay | Admitting: Podiatry

## 2019-12-07 ENCOUNTER — Encounter: Payer: Self-pay | Admitting: Podiatry

## 2019-12-07 ENCOUNTER — Ambulatory Visit (INDEPENDENT_AMBULATORY_CARE_PROVIDER_SITE_OTHER): Payer: 59 | Admitting: Podiatry

## 2019-12-07 ENCOUNTER — Other Ambulatory Visit: Payer: Self-pay

## 2019-12-07 DIAGNOSIS — M7661 Achilles tendinitis, right leg: Secondary | ICD-10-CM

## 2019-12-07 DIAGNOSIS — E119 Type 2 diabetes mellitus without complications: Secondary | ICD-10-CM

## 2019-12-07 DIAGNOSIS — B351 Tinea unguium: Secondary | ICD-10-CM | POA: Diagnosis not present

## 2019-12-07 MED ORDER — MELOXICAM 7.5 MG PO TABS: 7.5000 mg | ORAL_TABLET | Freq: Two times a day (BID) | ORAL | 2 refills | Status: DC

## 2019-12-07 NOTE — Progress Notes (Signed)
This patient returns to my office for at risk foot care.  This patient requires this care by a professional since this patient will be at risk due to having diabetes type 2.   Patient is having pain in the back of her right heel.  Previously diagnosed as achilles tendinitis by Dr.  Jacqualyn Posey.  This patient is unable to cut nails herself since the patient cannot reach her nails.These nails are painful walking and wearing shoes.  This patient presents for at risk foot care today.  General Appearance  Alert, conversant and in no acute stress.  Vascular  Dorsalis pedis and posterior tibial  pulses are palpable  bilaterally.  Capillary return is within normal limits  bilaterally. Temperature is within normal limits  bilaterally.  Neurologic  Senn-Weinstein monofilament wire test within normal limits  bilaterally. Muscle power within normal limits bilaterally.  Nails Thick disfigured discolored nails with subungual debris  from hallux to fifth toes bilaterally. No evidence of bacterial infection or drainage bilaterally.  Orthopedic  No limitations of motion  feet .  No crepitus or effusions noted.  No bony pathology or digital deformities noted.  HAV  B/L.  Retrocancaneal spur right foot.  Skin  normotropic skin with no porokeratosis noted bilaterally.  No signs of infections or ulcers noted.     Onychomycosis  Pain in right toes  Pain in left toes  Achilles tendinitis right foot.  Consent was obtained for treatment procedures.   Mechanical debridement of nails 1-5  bilaterally performed with a nail nipper.  Filed with dremel without incident. Told her to follow up Dr. Jacqualyn Posey for continued achilles tendinitis treatment.  Prescribe Mobic 7.5 mg  With 2 refills.   Return office visit  3 months                    Told patient to return for periodic foot care and evaluation due to potential at risk complications.   Gardiner Barefoot DPM

## 2019-12-13 MED FILL — DICLOFENAC SODIUM 1 % GEL: 1 | 13 days supply | Qty: 100 | Fill #0

## 2019-12-17 MED FILL — BASAGLAR 100 UNIT/ML KWIKPE: 100 | 57 days supply | Qty: 39 | Fill #2

## 2020-01-10 MED FILL — JANUVIA 100 MG TABLET: 100 | 30 days supply | Qty: 30 | Fill #3

## 2020-01-10 MED FILL — JARDIANCE 10 MG TABLET: 10 | 30 days supply | Qty: 30 | Fill #9

## 2020-02-11 MED FILL — CELECOXIB 100 MG CAP: 100 | 14 days supply | Qty: 28 | Fill #0

## 2020-02-11 MED FILL — JANUVIA 100 MG TABLET: 100 | 30 days supply | Qty: 30 | Fill #4

## 2020-02-11 MED FILL — JARDIANCE 10 MG TABLET: 10 | 30 days supply | Qty: 30 | Fill #10

## 2020-02-11 MED FILL — BASAGLAR 100 UNIT/ML KWIKPE: 100 | 57 days supply | Qty: 39 | Fill #3

## 2020-02-11 MED FILL — MELOXICAM 7.5 MG TABLET: 7.5 | 15 days supply | Qty: 30 | Fill #1

## 2020-02-28 NOTE — Progress Notes (Deleted)
64 y.o. [id number] Single Black or African American female here for annual exam.    Patient's last menstrual period was 06/28/2006 (approximate).          Sexually active: {yes no:314532}  The current method of family planning is post menopausal status.    Exercising: {yes no:314532}  {types:19826} Smoker:  {YES NO:22349}  Health Maintenance: Pap:  04-11-15 neg HPV HR neg,01-06-18 neg HPV HR neg History of abnormal Pap:  yes MMG:  05-13-2019 category b density birads 1:neg Colonoscopy: 05-05-18  BMD:   2019 TDaP:  2019 Pneumonia vaccine(s):  *** Shingrix:   *** Hep C testing: *** Screening Labs: ***   reports that she has never smoked. She has never used smokeless tobacco. She reports that she does not drink alcohol and does not use drugs.  Past Medical History:  Diagnosis Date  . Abnormal Pap smear of cervix 2014   ASCUS with negative HR HPV   . Anemia   . Cataract   . Diabetes mellitus without complication ([place])   . Hyperlipidemia    on medicine  . Plantar fasciitis   . 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 CATARACT EXTRACTION  2019  . COLONOSCOPY    . COLPOSCOPY W/ BIOPSY / CURETTAGE  02/04/1999   Chronic cervicitis    Current Outpatient Medications  Medication Sig Dispense Refill  . aspirin EC 81 MG tablet Take 81 mg by mouth daily.    . Biotin (BIOTIN 5000) 5 MG CAPS Take 1 capsule by mouth daily.    . clindamycin (CLEOCIN) 300 MG capsule     . Continuous Blood Gluc Sensor (FREESTYLE LIBRE 2 SENSOR) MISC CHANGE EVERY 14 DAYS TO MONITOR BLOOD GLUCOSE CONTINUOSLY    . empagliflozin (JARDIANCE) 10 MG TABS tablet Take 10 mg by mouth daily.    . fluconazole (DIFLUCAN) 150 MG tablet Take 1 tablet and repeat in 72 hours 1 tablet 2  . FREESTYLE LITE test strip     . gabapentin (NEURONTIN) 300 MG capsule Take 300 mg by mouth 3 (three) times daily. Only takes at night    . glimepiride (AMARYL) 4 MG tablet  Take 4 mg by mouth daily with breakfast.    . HUMALOG KWIKPEN 100 UNIT/ML KwikPen SMARTSIG:5 Unit(s) SUB-Q 3 Times Daily    . LANTUS SOLOSTAR 100 UNIT/ML Solostar Pen Inject 34 Units into the skin 2 (two) times daily. If CBG <80 in AM, take only 20 units.   1  . LEVEMIR FLEXTOUCH 100 UNIT/ML FlexPen     . meloxicam (MOBIC) 7.5 MG tablet Take 1 tablet (7.5 mg total) by mouth in the morning and at bedtime. 30 tablet 2  . metoprolol tartrate (LOPRESSOR) 25 MG tablet TAKE 1 TABLET 2 HR PRIOR TO CARDIAC PROCEDURE 1 tablet 0  . NONFORMULARY OR COMPOUNDED ITEM [facility]:  Antiinflammatory cream - Diclofenac 3%, Baclofen 2%, Cyclobenzaprine 2%, Lidocaine 2%, dispense 120grams, apply 1-2 grams to affected area 3-4 times a day, +2refills. 120 each 2  . prednisoLONE acetate (PRED FORTE) 1 % ophthalmic suspension     . ramipril (ALTACE) 2.5 MG capsule Take 2.5 mg by mouth every other day.     . rosuvastatin (CRESTOR) 40 MG tablet Take 1 tablet by mouth daily.    . sitaGLIPtin (JANUVIA) 100 MG tablet Take 100 mg by mouth daily.    Marland Kitchen terconazole (TERAZOL 7) 0.4 % vaginal cream Place  1 applicator vaginally at bedtime. (Patient taking differently: Place 1 applicator vaginally as needed. ) 45 g 0  . UNIFINE PENTIPS 32G X 4 MM MISC USE WITH LEVEMIR AND HUMALOG 4X A DAY [id number]    . valACYclovir (VALTREX) 1000 MG tablet TAKE 1/2 TABLET BY MOUTH DAILY, AND THEN 1/2 TABLET TWICE DAILY FOR 3 TO 5 DAYS FOR FLARE 45 tablet 0  . Vitamin D, Ergocalciferol, (DRISDOL) 50000 units CAPS capsule Take 1 capsule (50,000 Units total) by mouth every 7 (seven) days. 30 capsule 1   No current facility-administered medications for this visit.    Family History  Problem Relation Age of Onset  . Diabetes Mother   . Diabetes Father   . Heart disease Father   . Cancer Father        Prostate  . Heart failure Father   . Heart disease Sister   . Diabetes Sister   . Diabetes Brother   . Lupus Daughter   . Cancer Brother         lung cancer  . Diabetes Brother   . Diabetes Sister   . Heart disease Sister   . Heart failure Sister   . Thyroid disease Sister   . Diabetes Sister   . Diabetes Sister   . Diabetes Sister   . Diabetes Sister   . Diabetes Sister   . Diabetes Sister   . Colon cancer Neg Hx   . Colon polyps Neg Hx   . Rectal cancer Neg Hx   . Stomach cancer Neg Hx   . Esophageal cancer Neg Hx     Review of Systems  Exam:   LMP 06/28/2006 (Approximate)      General appearance: alert, cooperative and appears stated age Head: Normocephalic, without obvious abnormality, atraumatic Neck: no adenopathy, supple, symmetrical, trachea midline and thyroid {EXAM; THYROID:18604} Lungs: clear to auscultation bilaterally Breasts: {Exam; breast:13139::"normal appearance, no masses or tenderness"} Heart: regular rate and rhythm Abdomen: soft, non-tender; bowel sounds normal; no masses,  no organomegaly Extremities: extremities normal, atraumatic, no cyanosis or edema Skin: Skin color, texture, turgor normal. No rashes or lesions Lymph nodes: Cervical, supraclavicular, and axillary nodes normal. No abnormal inguinal nodes palpated Neurologic: Grossly normal   Pelvic: External genitalia:  no lesions              Urethra:  normal appearing urethra with no masses, tenderness or lesions              Bartholins and Skenes: normal                 Vagina: normal appearing vagina with normal color and discharge, no lesions              Cervix: {exam; cervix:14595}              Pap taken: {yes no:314532} Bimanual Exam:  Uterus:  {exam; uterus:12215}              Adnexa: {exam; adnexa:12223}               Rectovaginal: Confirms               Anus:  normal sphincter tone, no lesions  Chaperone, ***Terence Lux, CMA, was present for exam.  A:  Well Woman with normal exam  P:   {plan; gyn:5269::"mammogram","pap smear","return annually or prn"}

## 2020-03-04 ENCOUNTER — Ambulatory Visit: Payer: 59 | Admitting: Obstetrics & Gynecology

## 2020-03-04 ENCOUNTER — Ambulatory Visit (INDEPENDENT_AMBULATORY_CARE_PROVIDER_SITE_OTHER): Payer: 59 | Admitting: Obstetrics & Gynecology

## 2020-03-04 ENCOUNTER — Other Ambulatory Visit: Payer: Self-pay

## 2020-03-04 ENCOUNTER — Encounter: Payer: Self-pay | Admitting: Obstetrics & Gynecology

## 2020-03-04 VITALS — BP 116/72 | HR 68 | Resp 16 | Ht 62.5 in | Wt 164.0 lb

## 2020-03-04 DIAGNOSIS — N898 Other specified noninflammatory disorders of vagina: Secondary | ICD-10-CM

## 2020-03-04 DIAGNOSIS — Z01419 Encounter for gynecological examination (general) (routine) without abnormal findings: Secondary | ICD-10-CM | POA: Diagnosis not present

## 2020-03-04 MED ORDER — VALACYCLOVIR HCL 500 MG PO TABS: 500.0000 mg | ORAL_TABLET | Freq: Two times a day (BID) | ORAL | 2 refills | Status: DC

## 2020-03-04 MED ORDER — TERCONAZOLE 0.4 % VA CREA: TOPICAL_CREAM | VAGINAL | 0 refills | Status: DC

## 2020-03-04 MED FILL — VALACYCLOVIR HCL 500 MG TAB: 500 | 15 days supply | Qty: 30 | Fill #0

## 2020-03-04 MED FILL — TERCONAZOLE 0.4% VAG CREAM: 0.4 | 7 days supply | Qty: 45 | Fill #0

## 2020-03-04 NOTE — Progress Notes (Signed)
64 y.o. [id number] Single Black or [place] American female here for annual exam.  Denies vaginal bleeding.  Is having external vaginal itching.    Having significant issues with diarrhea.  Has appt with Dr. Collene Mares on 03/11/2020.  Taking kaopectate.  Wonders if this is psychological because she always has issues at work.  Sometimes she doesn't even feel it coming out of her.  Denies blood in stool  Saw Dr. Harrell Gave in December.  Was having some SOB.  Had coronary CT and echo.  Has follow up one year.  Patient's last menstrual period was 06/28/2006 (approximate).          Sexually active: No.  The current method of family planning is post menopausal status.    Exercising: Yes.    walking Smoker:  no  Health Maintenance: Pap:  04-11-15 neg HPV HR neg, 01-06-18 neg HPV HR neg History of abnormal Pap:  yes MMG:  05-13-2019 category b density birads 1:neg Colonoscopy:  05-15-18 BMD:   2019 TDaP:  2019 Pneumonia vaccine(s):  no Shingrix:   Per patient no Hep C testing: neg per patient Screening Labs: does with Dr. Virgina Jock   reports that she has never smoked. She has never used smokeless tobacco. She reports that she does not drink alcohol and does not use drugs.  Past Medical History:  Diagnosis Date  . Abnormal Pap smear of cervix 2014   ASCUS with negative HR HPV   . Anemia   . Cataract   . Diabetes mellitus without complication ([place])   . Hyperlipidemia    on medicine  . Plantar fasciitis   . 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 CATARACT EXTRACTION  2019  . COLONOSCOPY    . COLPOSCOPY W/ BIOPSY / CURETTAGE  02/04/1999   Chronic cervicitis    Current Outpatient Medications  Medication Sig Dispense Refill  . aspirin EC 81 MG tablet Take 81 mg by mouth daily.    . Biotin (BIOTIN 5000) 5 MG CAPS Take 1 capsule by mouth daily.    . Continuous Blood Gluc Sensor (FREESTYLE LIBRE 2 SENSOR) MISC CHANGE EVERY 14 DAYS TO  MONITOR BLOOD GLUCOSE CONTINUOSLY    . empagliflozin (JARDIANCE) 10 MG TABS tablet Take 10 mg by mouth daily.    Marland Kitchen FREESTYLE LITE test strip     . gabapentin (NEURONTIN) 300 MG capsule Take 300 mg by mouth 3 (three) times daily. Only takes at night    . HUMALOG KWIKPEN 100 UNIT/ML KwikPen SMARTSIG:5 Unit(s) SUB-Q 3 Times Daily    . LANTUS SOLOSTAR 100 UNIT/ML Solostar Pen Inject 34 Units into the skin 2 (two) times daily. If CBG <80 in AM, take only 20 units.   1  . NONFORMULARY OR COMPOUNDED ITEM [facility]:  Antiinflammatory cream - Diclofenac 3%, Baclofen 2%, Cyclobenzaprine 2%, Lidocaine 2%, dispense 120grams, apply 1-2 grams to affected area 3-4 times a day, +2refills. 120 each 2  . ramipril (ALTACE) 2.5 MG capsule Take 2.5 mg by mouth every other day.     . rosuvastatin (CRESTOR) 40 MG tablet Take 1 tablet by mouth daily.    . sitaGLIPtin (JANUVIA) 100 MG tablet Take 100 mg by mouth daily.    Marland Kitchen UNIFINE PENTIPS 32G X 4 MM MISC USE WITH LEVEMIR AND HUMALOG 4X A DAY DX-E11.65    . VITAMIN D PO Take by mouth.    . meloxicam (MOBIC) 7.5 MG  tablet Take 1 tablet (7.5 mg total) by mouth in the morning and at bedtime. (Patient not taking: Reported on 03/04/2020) 30 tablet 2   No current facility-administered medications for this visit.    Family History  Problem Relation Age of Onset  . Diabetes Mother   . Diabetes Father   . Heart disease Father   . Cancer Father        Prostate  . Heart failure Father   . Heart disease Sister   . Diabetes Sister   . Diabetes Brother   . Lupus Daughter   . Cancer Brother        lung cancer  . Diabetes Brother   . Diabetes Sister   . Heart disease Sister   . Heart failure Sister   . Thyroid disease Sister   . Diabetes Sister   . Diabetes Sister   . Diabetes Sister   . Diabetes Sister   . Diabetes Sister   . Diabetes Sister   . Colon cancer Neg Hx   . Colon polyps Neg Hx   . Rectal cancer Neg Hx   . Stomach cancer Neg Hx   . Esophageal  cancer Neg Hx     Review of Systems  Constitutional: Negative.   HENT: Negative.   Eyes: Negative.   Respiratory: Negative.   Cardiovascular: Negative.   Gastrointestinal: Negative.   Endocrine: Negative.   Genitourinary:       Vaginal itching  Musculoskeletal: Negative.   Skin: Negative.   Allergic/Immunologic: Negative.   Neurological: Negative.   Hematological: Negative.   Psychiatric/Behavioral: Negative.     Exam:   Ht 5' 2.5" (1.588 m)   Wt 164 lb (74.4 kg)   LMP 06/28/2006 (Approximate)   BMI 29.52 kg/m   Height: 5' 2.5" (158.8 cm)  General appearance: alert, cooperative and appears stated age Head: Normocephalic, without obvious abnormality, atraumatic Neck: no adenopathy, supple, symmetrical, trachea midline and thyroid normal to inspection and palpation Lungs: clear to auscultation bilaterally Breasts: normal appearance, no masses or tenderness Heart: regular rate and rhythm Abdomen: soft, non-tender; bowel sounds normal; no masses,  no organomegaly Extremities: extremities normal, atraumatic, no cyanosis or edema Skin: Skin color, texture, turgor normal. No rashes or lesions Lymph nodes: Cervical, supraclavicular, and axillary nodes normal. No abnormal inguinal nodes palpated Neurologic: Grossly normal   Pelvic: External genitalia:  Erythema with whitish adherent discharge to skin in inner labia majora and no perirectal lesions              Urethra:  normal appearing urethra with no masses, tenderness or lesions              Bartholins and Skenes: normal                 Vagina: normal appearing vagina with normal color and discharge, no lesions              Cervix: no lesions              Pap taken: No. Bimanual Exam:  Uterus:  normal size, contour, position, consistency, mobility, non-tender              Adnexa: normal adnexa and no mass, fullness, tenderness               Rectovaginal: not done today due to diarrhea  Chaperone, Terence Lux, CMA,  was present for exam.  A:  Well Woman with normal exam PMP, no HRT Diabetes, recent hba1c per  pt was in th e9 range Elevated lipids H/o HSV, no recent outbreaks H/o ASCUS pap 2014 with neg HR HPV Recent worsening diarrhea Vulvar, external, itching  P:   Mammogram guidelines reviewed pap smear neg with neg HR HPV 12/2017 Colonoscopy neg 2019 BMD done 2019 Lab work done with Dr. Virgina Jock Vaccines UTD.  Pt states she has completed the shingrix series.  Pneumonia vaccination reviewed for after 64th birthday Affirm pending Terazol 7 externally bid x 7 days return annually or prn

## 2020-03-05 LAB — VAGINITIS/VAGINOSIS, DNA PROBE
Candida Species: POSITIVE — AB
Gardnerella vaginalis: NEGATIVE
Trichomonas vaginosis: NEGATIVE

## 2020-03-07 ENCOUNTER — Telehealth: Payer: Self-pay

## 2020-03-07 MED FILL — ROSUVASTATIN CALCIUM 40 MG: 40 | 90 days supply | Qty: 90 | Fill #1

## 2020-03-07 MED FILL — RAMIPRIL 2.5 MG CAPSULE: 2.5 | 90 days supply | Qty: 45 | Fill #3

## 2020-03-07 NOTE — Telephone Encounter (Signed)
Left message for call back.

## 2020-03-07 NOTE — Telephone Encounter (Signed)
Patient notified of results. See lab 

## 2020-03-07 NOTE — Telephone Encounter (Signed)
-----   Message from Megan Salon, MD sent at 03/07/2020  8:03 AM EDT ----- Please let pt know her vaginitis testing showed yeast.  She was treated with Terazol 7.  I'd like to know if she is feeling better.  She should finish 7 full days of treatment.  Thanks.

## 2020-03-11 DIAGNOSIS — R194 Change in bowel habit: Secondary | ICD-10-CM | POA: Diagnosis not present

## 2020-03-11 DIAGNOSIS — R152 Fecal urgency: Secondary | ICD-10-CM | POA: Diagnosis not present

## 2020-03-11 DIAGNOSIS — K7581 Nonalcoholic steatohepatitis (NASH): Secondary | ICD-10-CM | POA: Diagnosis not present

## 2020-03-11 DIAGNOSIS — R197 Diarrhea, unspecified: Secondary | ICD-10-CM | POA: Diagnosis not present

## 2020-03-14 ENCOUNTER — Other Ambulatory Visit: Payer: Self-pay

## 2020-03-14 ENCOUNTER — Encounter: Payer: Self-pay | Admitting: Podiatry

## 2020-03-14 ENCOUNTER — Ambulatory Visit: Payer: 59 | Admitting: Podiatry

## 2020-03-14 DIAGNOSIS — E119 Type 2 diabetes mellitus without complications: Secondary | ICD-10-CM | POA: Diagnosis not present

## 2020-03-14 DIAGNOSIS — M79676 Pain in unspecified toe(s): Secondary | ICD-10-CM

## 2020-03-14 DIAGNOSIS — B351 Tinea unguium: Secondary | ICD-10-CM

## 2020-03-14 NOTE — Progress Notes (Signed)
This patient returns to my office for at risk foot care.  This patient requires this care by a professional since this patient will be at risk due to having diabetes type 2.   Patient is having pain in the back of her right heel.  Previously diagnosed as achilles tendinitis by Dr.  Jacqualyn Posey.  This patient is unable to cut nails herself since the patient cannot reach her nails.These nails are painful walking and wearing shoes.  This patient presents for at risk foot care today.  General Appearance  Alert, conversant and in no acute stress.  Vascular  Dorsalis pedis and posterior tibial  pulses are palpable  bilaterally.  Capillary return is within normal limits  bilaterally. Temperature is within normal limits  bilaterally.  Neurologic  Senn-Weinstein monofilament wire test within normal limits  bilaterally. Muscle power within normal limits bilaterally.  Nails Thick disfigured discolored nails with subungual debris  from hallux to fifth toes bilaterally. No evidence of bacterial infection or drainage bilaterally.  Orthopedic  No limitations of motion  feet .  No crepitus or effusions noted.  No bony pathology or digital deformities noted.  HAV  B/L.  Retrocancaneal spur right foot.  Skin  normotropic skin with no porokeratosis noted bilaterally.  No signs of infections or ulcers noted.     Onychomycosis  Pain in right toes  Pain in left toes  Achilles tendinitis right foot.  Consent was obtained for treatment procedures.   Mechanical debridement of nails 1-5  bilaterally performed with a nail nipper.  Filed with dremel without incident. Told her to follow up Dr. Jacqualyn Posey for continued achilles tendinitis treatment.   Return office visit  3 months                    Told patient to return for periodic foot care and evaluation due to potential at risk complications.   Gardiner Barefoot DPM

## 2020-03-20 ENCOUNTER — Other Ambulatory Visit (HOSPITAL_COMMUNITY): Payer: Self-pay | Admitting: Internal Medicine

## 2020-03-20 MED FILL — JARDIANCE 10 MG TABLET: 10 | 30 days supply | Qty: 30 | Fill #0

## 2020-03-20 MED FILL — JANUVIA 100 MG TABLET: 100 | 30 days supply | Qty: 30 | Fill #5

## 2020-03-31 MED FILL — JANUVIA 100 MG TABLET: 100 | 30 days supply | Qty: 30 | Fill #5

## 2020-03-31 MED FILL — JARDIANCE 10 MG TABLET: 10 | 30 days supply | Qty: 30 | Fill #0

## 2020-03-31 MED FILL — FREESTYLE LIBRE 2 SENSOR MI: 28 days supply | Qty: 2 | Fill #1

## 2020-04-16 ENCOUNTER — Other Ambulatory Visit (HOSPITAL_COMMUNITY): Payer: Self-pay | Admitting: Registered Nurse

## 2020-04-16 DIAGNOSIS — E785 Hyperlipidemia, unspecified: Secondary | ICD-10-CM | POA: Diagnosis not present

## 2020-04-16 DIAGNOSIS — Z1152 Encounter for screening for COVID-19: Secondary | ICD-10-CM | POA: Diagnosis not present

## 2020-04-16 DIAGNOSIS — R059 Cough, unspecified: Secondary | ICD-10-CM | POA: Diagnosis not present

## 2020-04-16 DIAGNOSIS — Z794 Long term (current) use of insulin: Secondary | ICD-10-CM | POA: Diagnosis not present

## 2020-04-16 DIAGNOSIS — E11319 Type 2 diabetes mellitus with unspecified diabetic retinopathy without macular edema: Secondary | ICD-10-CM | POA: Diagnosis not present

## 2020-04-16 DIAGNOSIS — U071 COVID-19: Secondary | ICD-10-CM | POA: Diagnosis not present

## 2020-04-16 MED FILL — levoFLOXacin 500 MG TABS: 500 | 7 days supply | Qty: 7 | Fill #0

## 2020-04-16 MED FILL — ALBUTEROL SULFATE HFA 108 (: 108 (90 BAS | 17 days supply | Qty: 18 | Fill #0

## 2020-04-17 ENCOUNTER — Telehealth (HOSPITAL_COMMUNITY): Payer: Self-pay

## 2020-04-17 ENCOUNTER — Other Ambulatory Visit: Payer: Self-pay | Admitting: Family

## 2020-04-17 DIAGNOSIS — U071 COVID-19: Secondary | ICD-10-CM

## 2020-04-17 NOTE — Telephone Encounter (Signed)
Called to Discuss with patient about Covid symptoms and the use of the monoclonal antibody infusion for those with mild to moderate Covid symptoms and at a high risk of hospitalization.     Pt appears to qualify for this infusion due to co-morbid conditions and/or a member of an at-risk group in accordance with the FDA Emergency Use Authorization.    Pre-screened and referred to APP to call and schedule.

## 2020-04-17 NOTE — Progress Notes (Signed)
I connected by phone with Rachel Gaines on 04/17/2020 at 12:48 PM to discuss the potential use of a new treatment for mild to moderate COVID-19 viral infection in non-hospitalized patients.  This patient is a 64 y.o. female that meets the FDA criteria for Emergency Use Authorization of COVID monoclonal antibody casirivimab/imdevimab or bamlanivimab/eteseviamb.  Has a (+) direct SARS-CoV-2 viral test result  Has mild or moderate COVID-19   Is NOT hospitalized due to COVID-19  Is within 10 days of symptom onset  Has at least one of the high risk factor(s) for progression to severe COVID-19 and/or hospitalization as defined in EUA.  Specific high risk criteria : Older age (>/= 65 yo) and Diabetes Symptoms of cough and SOB began 04/12/20.  I have spoken and communicated the following to the patient or parent/caregiver regarding COVID monoclonal antibody treatment:  1. FDA has authorized the emergency use for the treatment of mild to moderate COVID-19 in adults and pediatric patients with positive results of direct SARS-CoV-2 viral testing who are 61 years of age and older weighing at least 40 kg, and who are at high risk for progressing to severe COVID-19 and/or hospitalization.  2. The significant known and potential risks and benefits of COVID monoclonal antibody, and the extent to which such potential risks and benefits are unknown.  3. Information on available alternative treatments and the risks and benefits of those alternatives, including clinical trials.  4. Patients treated with COVID monoclonal antibody should continue to self-isolate and use infection control measures (e.g., wear mask, isolate, social distance, avoid sharing personal items, clean and disinfect "high touch" surfaces, and frequent handwashing) according to CDC guidelines.   5. The patient or parent/caregiver has the option to accept or refuse COVID monoclonal antibody treatment.  After reviewing this information  with the patient, the patient has agreed to receive one of the available covid 19 monoclonal antibodies and will be provided an appropriate fact sheet prior to infusion. [name], NP 04/17/2020 12:48 PM

## 2020-04-18 ENCOUNTER — Other Ambulatory Visit (HOSPITAL_COMMUNITY): Payer: Self-pay | Admitting: Internal Medicine

## 2020-04-18 ENCOUNTER — Ambulatory Visit (HOSPITAL_COMMUNITY)
Admission: RE | Admit: 2020-04-18 | Discharge: 2020-04-18 | Disposition: A | Discharge status: Home / Self Care | Payer: 59 | Source: Ambulatory Visit | Attending: Pulmonary Disease | Admitting: Pulmonary Disease

## 2020-04-18 DIAGNOSIS — R54 Age-related physical debility: Secondary | ICD-10-CM | POA: Diagnosis not present

## 2020-04-18 DIAGNOSIS — E119 Type 2 diabetes mellitus without complications: Secondary | ICD-10-CM | POA: Insufficient documentation

## 2020-04-18 DIAGNOSIS — U071 COVID-19: Secondary | ICD-10-CM | POA: Diagnosis not present

## 2020-04-18 MED ORDER — METHYLPREDNISOLONE SODIUM SUCC 125 MG IJ SOLR: 125.0000 mg | Freq: Once | INTRAMUSCULAR | Status: DC | PRN

## 2020-04-18 MED ORDER — EPINEPHRINE 0.3 MG/0.3ML IJ SOAJ: 0.3000 mg | Freq: Once | INTRAMUSCULAR | Status: DC | PRN

## 2020-04-18 MED ORDER — DIPHENHYDRAMINE HCL 50 MG/ML IJ SOLN: 50.0000 mg | Freq: Once | INTRAMUSCULAR | Status: DC | PRN

## 2020-04-18 MED ORDER — FAMOTIDINE IN NACL 20-0.9 MG/50ML-% IV SOLN: 20.0000 mg | Freq: Once | INTRAVENOUS | Status: DC | PRN

## 2020-04-18 MED ORDER — ALBUTEROL SULFATE HFA 108 (90 BASE) MCG/ACT IN AERS: 2.0000 | INHALATION_SPRAY | Freq: Once | RESPIRATORY_TRACT | Status: DC | PRN

## 2020-04-18 MED ORDER — SODIUM CHLORIDE 0.9 % IV SOLN: INTRAVENOUS | Status: DC | PRN

## 2020-04-18 MED ORDER — LOPERAMIDE HCL 2 MG PO CAPS
4.0000 mg | ORAL_CAPSULE | ORAL | Status: DC | PRN
  Filled 2020-04-18 (×2): qty 2

## 2020-04-18 MED ORDER — SODIUM CHLORIDE 0.9 % IV SOLN: Freq: Once | INTRAVENOUS | Status: AC

## 2020-04-18 NOTE — Discharge Instructions (Signed)

## 2020-04-18 NOTE — Progress Notes (Signed)
  Diagnosis: COVID-19  Physician: Dr. Joya Gaskins  Procedure: Cov[facility] Infusion Clinic Med: bamlanivimab\etesevimab infusion - Provided patient with bamlanimivab\etesevimab fact sheet for patients, parents and caregivers prior to infusion.  Complications: No immediate complications noted.  Discharge: Discharged home   Rachel Gaines 04/18/2020

## 2020-05-12 MED FILL — BASAGLAR 100 UNIT/ML KWIKPE: 100 | 57 days supply | Qty: 39 | Fill #4

## 2020-05-12 MED FILL — JANUVIA 100 MG TABLET: 100 | 30 days supply | Qty: 30 | Fill #6

## 2020-05-13 MED FILL — UNIFINE PENTIPS 8MM 31G: 31G X 8 MM | 25 days supply | Qty: 100 | Fill #0

## 2020-05-20 MED FILL — JARDIANCE 10 MG TABLET: 10 | 30 days supply | Qty: 30 | Fill #1

## 2020-05-20 MED FILL — MELOXICAM 7.5 MG TABLET: 7.5 | 15 days supply | Qty: 30 | Fill #2

## 2020-06-13 ENCOUNTER — Ambulatory Visit: Payer: 59 | Admitting: Podiatry

## 2020-06-17 DIAGNOSIS — K5730 Diverticulosis of large intestine without perforation or abscess without bleeding: Secondary | ICD-10-CM | POA: Diagnosis not present

## 2020-06-17 DIAGNOSIS — R152 Fecal urgency: Secondary | ICD-10-CM | POA: Diagnosis not present

## 2020-06-17 DIAGNOSIS — R197 Diarrhea, unspecified: Secondary | ICD-10-CM | POA: Diagnosis not present

## 2020-06-17 DIAGNOSIS — R159 Full incontinence of feces: Secondary | ICD-10-CM | POA: Diagnosis not present

## 2020-06-17 DIAGNOSIS — K7581 Nonalcoholic steatohepatitis (NASH): Secondary | ICD-10-CM | POA: Diagnosis not present

## 2020-06-28 DIAGNOSIS — C801 Malignant (primary) neoplasm, unspecified: Secondary | ICD-10-CM

## 2020-06-28 HISTORY — DX: Malignant (primary) neoplasm, unspecified: C80.1

## 2020-07-01 ENCOUNTER — Other Ambulatory Visit (HOSPITAL_COMMUNITY): Payer: Self-pay | Admitting: Internal Medicine

## 2020-07-01 DIAGNOSIS — H40013 Open angle with borderline findings, low risk, bilateral: Secondary | ICD-10-CM | POA: Diagnosis not present

## 2020-07-01 DIAGNOSIS — E113212 Type 2 diabetes mellitus with mild nonproliferative diabetic retinopathy with macular edema, left eye: Secondary | ICD-10-CM | POA: Diagnosis not present

## 2020-07-01 DIAGNOSIS — Z794 Long term (current) use of insulin: Secondary | ICD-10-CM | POA: Diagnosis not present

## 2020-07-01 DIAGNOSIS — E113291 Type 2 diabetes mellitus with mild nonproliferative diabetic retinopathy without macular edema, right eye: Secondary | ICD-10-CM | POA: Diagnosis not present

## 2020-07-01 DIAGNOSIS — Z961 Presence of intraocular lens: Secondary | ICD-10-CM | POA: Diagnosis not present

## 2020-07-01 MED FILL — BENZONATATE 200 MG CAPS: 200 | 10 days supply | Qty: 30 | Fill #0

## 2020-07-02 DIAGNOSIS — Z03818 Encounter for observation for suspected exposure to other biological agents ruled out: Secondary | ICD-10-CM | POA: Diagnosis not present

## 2020-07-03 ENCOUNTER — Other Ambulatory Visit (HOSPITAL_COMMUNITY): Payer: Self-pay | Admitting: Internal Medicine

## 2020-07-03 MED FILL — BASAGLAR 100 UNIT/ML KWIKPE: 100 | 48 days supply | Qty: 33 | Fill #5

## 2020-07-03 MED FILL — JANUVIA 100 MG TABLET: 100 | 30 days supply | Qty: 30 | Fill #7

## 2020-07-03 MED FILL — RAMIPRIL 2.5 MG CAPSULE: 2.5 | 90 days supply | Qty: 45 | Fill #0

## 2020-07-04 ENCOUNTER — Other Ambulatory Visit: Payer: Self-pay

## 2020-07-04 ENCOUNTER — Encounter: Payer: Self-pay | Admitting: Cardiology

## 2020-07-04 ENCOUNTER — Ambulatory Visit: Payer: 59 | Admitting: Cardiology

## 2020-07-04 VITALS — BP 128/66 | HR 68 | Ht 64.0 in | Wt 165.8 lb

## 2020-07-04 DIAGNOSIS — G629 Polyneuropathy, unspecified: Secondary | ICD-10-CM | POA: Diagnosis not present

## 2020-07-04 DIAGNOSIS — E559 Vitamin D deficiency, unspecified: Secondary | ICD-10-CM | POA: Diagnosis not present

## 2020-07-04 DIAGNOSIS — Z794 Long term (current) use of insulin: Secondary | ICD-10-CM

## 2020-07-04 DIAGNOSIS — R079 Chest pain, unspecified: Secondary | ICD-10-CM | POA: Diagnosis not present

## 2020-07-04 DIAGNOSIS — E782 Mixed hyperlipidemia: Secondary | ICD-10-CM | POA: Insufficient documentation

## 2020-07-04 DIAGNOSIS — Z8616 Personal history of COVID-19: Secondary | ICD-10-CM | POA: Diagnosis not present

## 2020-07-04 DIAGNOSIS — F418 Other specified anxiety disorders: Secondary | ICD-10-CM | POA: Diagnosis not present

## 2020-07-04 DIAGNOSIS — Z7189 Other specified counseling: Secondary | ICD-10-CM | POA: Diagnosis not present

## 2020-07-04 DIAGNOSIS — E785 Hyperlipidemia, unspecified: Secondary | ICD-10-CM | POA: Diagnosis not present

## 2020-07-04 DIAGNOSIS — E119 Type 2 diabetes mellitus without complications: Secondary | ICD-10-CM

## 2020-07-04 DIAGNOSIS — M1990 Unspecified osteoarthritis, unspecified site: Secondary | ICD-10-CM | POA: Diagnosis not present

## 2020-07-04 DIAGNOSIS — R072 Precordial pain: Secondary | ICD-10-CM | POA: Diagnosis not present

## 2020-07-04 DIAGNOSIS — E669 Obesity, unspecified: Secondary | ICD-10-CM | POA: Diagnosis not present

## 2020-07-04 DIAGNOSIS — E11319 Type 2 diabetes mellitus with unspecified diabetic retinopathy without macular edema: Secondary | ICD-10-CM | POA: Diagnosis not present

## 2020-07-04 NOTE — Patient Instructions (Signed)
Medication Instructions:  Your Physician recommend you continue on your current medication as directed.    *If you need a refill on your cardiac medications before your next appointment, please call your pharmacy*   Lab Work: None   Testing/Procedures: None   Follow-Up: At [facility], you and your health needs are our priority.  As part of our continuing mission to provide you with exceptional heart care, we have created designated Provider Care Teams.  These Care Teams include your primary Cardiologist (physician) and Advanced Practice Providers (APPs -  Physician Assistants and Nurse Practitioners) who all work together to provide you with the care you need, when you need it.  We recommend signing up for the patient portal called "MyChart".  Sign up information is provided on this After Visit Summary.  MyChart is used to connect with patients for Virtual Visits (Telemedicine).  Patients are able to view lab/test results, encounter notes, upcoming appointments, etc.  Non-urgent messages can be sent to your provider as well.   To learn more about what you can do with MyChart, go to [email or web address].    Your next appointment:   2 year(s)  The format for your next appointment:   In Person  Provider:   Bridgette Christopher, MD     

## 2020-07-04 NOTE — Progress Notes (Signed)
Cardiology Office Note:    Date:  07/04/2020   ID:  Rachel Gaines, DOB 1955-09-15, MRN [id number]  PCP:  Shon Baton, MD  Cardiologist:  Buford Dresser, MD  Referring MD: Shon Baton, MD   CC: follow up  History of Present Illness:    Rachel Gaines is a 65 y.o. female with a hx of type II diabetes, obesity, arthritis, hyperlipidemia who is seen for follow up. I initially saw her 04/27/19 as a new consult at the request of Shon Baton, MD for the evaluation and management of chest pain.  Today: Since our last visit, diagnose with Covid 03/2020, received infusion. Had been vaccinated, had mild symptoms. Is waiting on a Covid test result from last week, had a runny nose but no fevers/chills. She was told not to come to her appt with Dr. Virgina Jock later today unless negative, but she presented in person to our appt today.  Continues to have occasional chest tightness, mild. Not exertional, not limiting.    Denies shortness of breath at rest or with normal exertion. If she pushes herself with heavy exertion, she gets mild shortness of breath. No PND, orthopnea, LE edema or unexpected weight gain. No syncope or palpitations.   Past Medical History:  Diagnosis Date  . Abnormal Pap smear of cervix 2014   ASCUS with negative HR HPV   . Anemia   . Cataract   . Diabetes mellitus without complication ([place])   . Hyperlipidemia    on medicine  . Plantar fasciitis   . 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 CATARACT EXTRACTION  2019  . COLONOSCOPY    . COLPOSCOPY W/ BIOPSY / CURETTAGE  02/04/1999   Chronic cervicitis    Current Medications: Current Outpatient Medications on File Prior to Visit  Medication Sig  . aspirin EC 81 MG tablet Take 81 mg by mouth daily.  . Biotin 5 MG CAPS Take 1 capsule by mouth daily.  . Continuous Blood Gluc Sensor (FREESTYLE LIBRE 2 SENSOR) MISC CHANGE EVERY 14 DAYS TO MONITOR BLOOD  GLUCOSE CONTINUOSLY  . diclofenac Sodium (VOLTAREN) 1 % GEL SMARTSIG:2 Gram(s) Topical 1 to 4 Times Daily PRN  . empagliflozin (JARDIANCE) 10 MG TABS tablet Take 10 mg by mouth daily.  Marland Kitchen FREESTYLE LITE test strip   . gabapentin (NEURONTIN) 300 MG capsule Take 300 mg by mouth 3 (three) times daily. Only takes at night  . HUMALOG KWIKPEN 100 UNIT/ML KwikPen SMARTSIG:5 Unit(s) SUB-Q 3 Times Daily  . LANTUS SOLOSTAR 100 UNIT/ML Solostar Pen Inject 34 Units into the skin 2 (two) times daily. If CBG <80 in AM, take only 20 units.   . meloxicam (MOBIC) 7.5 MG tablet Take 1 tablet (7.5 mg total) by mouth in the morning and at bedtime.  . NONFORMULARY OR COMPOUNDED ITEM [facility]:  Antiinflammatory cream - Diclofenac 3%, Baclofen 2%, Cyclobenzaprine 2%, Lidocaine 2%, dispense 120grams, apply 1-2 grams to affected area 3-4 times a day, +2refills.  . ramipril (ALTACE) 2.5 MG capsule Take 2.5 mg by mouth every other day.   . rosuvastatin (CRESTOR) 40 MG tablet Take 1 tablet by mouth daily.  . sitaGLIPtin (JANUVIA) 100 MG tablet Take 100 mg by mouth daily.  Marland Kitchen UNIFINE PENTIPS 32G X 4 MM MISC USE WITH LEVEMIR AND HUMALOG 4X A DAY DX-E11.65  . valACYclovir (VALTREX) 500 MG tablet Take 1 tablet (500 mg total) by mouth 2 (two) times  daily. Take for 3 days for outbreak  . VITAMIN D PO Take by mouth.   No current facility-administered medications on file prior to visit.     Allergies:   Penicillins   Social History   Tobacco Use  . Smoking status: Never Smoker  . Smokeless tobacco: Never Used  Substance Use Topics  . Alcohol use: No    Alcohol/week: 0.0 standard drinks  . Drug use: No    Family History: family history includes Cancer in her brother and father; Diabetes in her brother, brother, father, mother, sister, sister, sister, sister, sister, sister, sister, and sister; Heart disease in her father, sister, and sister; Heart failure in her father and sister; Lupus in her daughter; Thyroid  disease in her sister. There is no history of Colon cancer, Colon polyps, Rectal cancer, Stomach cancer, or Esophageal cancer.  father, sister had heart problems. Sister had PM, then ICD, had frequent shocks so they took out his ICD. Passed away in 10-12-2015 in her late 68s. Brother had unknown heart issues, had "dark legs." Another brother with unknown heart issues, has diabetes; she thinks he had stents placed.  ROS:   Please see the history of present illness.  Additional pertinent ROS otherwise unremarkable.  EKGs/Labs/Other Studies Reviewed:    The following studies were reviewed today: Cardiac CT 06/21/19 1. No evidence of CAD, CADRADS = 0. 2. Coronary calcium score of 0. This was 0 percentile for age and sex matched control. 3. Normal coronary origin with right dominance.  Echo 05/11/19  1. Left ventricular ejection fraction, by visual estimation, is 60 to 65%. The left ventricle has normal function. There is no left ventricular hypertrophy.  2. The left ventricle has no regional wall motion abnormalities.  3. LVEF by 3D assessment 63%.  4. Global right ventricle has normal systolic function.The right ventricular size is normal. No increase in right ventricular wall thickness.  5. Left atrial size was normal.  6. Right atrial size was normal.  7. Presence of pericardial fat pad.  8. Trivial pericardial effusion is present.  9. The mitral valve is grossly normal. No evidence of mitral valve regurgitation. 10. The tricuspid valve is grossly normal. Tricuspid valve regurgitation is not demonstrated. 11. The aortic valve is tricuspid. Aortic valve regurgitation is not visualized. No evidence of aortic valve sclerosis or stenosis. 12. The pulmonic valve was grossly normal. Pulmonic valve regurgitation is not visualized. 13. The average left ventricular global longitudinal strain is -21.7 %.  02/24/2000 Nuclear stress FINDINGS CLINICAL DATA:  CHEST PAIN.  FAMILY HISTORY OF CAD. STRESS  CARDIOLITE SPECT IMAGING: REST SPECT IMAGES WERE OBTAINED IN THREE PROJECTIONS FOLLOWING IV INJECTION OF 10 mCi 48mTc SESTAMIBI.  THE PATIENT EXERCISED ON THE TREADMILL ACCORDING TO THE BRUCE PROTOCOL FOR FIVE MINUTES, AND ACHIEVED A HEART RATE OF 175.   DURING PEAK EXERCISE, 30 mCi 54mTc SESTAMIBI WAS INJECTED, AND POST STRESS SPECT IMAGES WERE ACQUIRED.  NO REVERSIBLE OR FIXED PERFUSION DEFECTS ARE DETECTED. GATED SPECT WALL MOTION STUDY: LV WALL MOTION IS STUDIED IN THREE PROJECTIONS.  WITH SYSTOLE THERE IS NORMAL LV WALL THICKENING AND COLOR INTENSIFICATION.  NO HYPOKINETIC SEGMENTS ARE NOTED. QUANTITATIVE GATED SPECT STUDY: EDV OF THE LV IS 71 CC.  ESV IS 70 CC.  RESTING LVEF IS 76%. IMPRESSION THE STUDY IS NEGATIVE FOR ISCHEMIA OR INFARCTION.  NORMAL LV WALL MOTION AT REST.  RESTING LVEF IS 76%.  EKG:  EKG is personally reviewed.  The ekg ordered today demonstrates NSR at 68  bpm.  Recent Labs: No results found for requested labs within last 8760 hours.  Recent Lipid Panel    Component Value Date/Time   CHOL 156 04/27/2019 1033   TRIG 143 04/27/2019 1033   HDL 47 04/27/2019 1033   CHOLHDL 3.3 04/27/2019 1033   LDLCALC 84 04/27/2019 1033    Physical Exam:    VS:  BP 128/66   Pulse 68   Ht 5\' 4"  (1.626 m)   Wt 165 lb 12.8 oz (75.2 kg)   LMP 06/28/2006 (Approximate)   SpO2 98%   BMI 28.46 kg/m     Wt Readings from Last 3 Encounters:  07/04/20 165 lb 12.8 oz (75.2 kg)  03/04/20 164 lb (74.4 kg)  07/02/19 171 lb 9.6 oz (77.8 kg)    GEN: Well nourished, well developed in no acute distress HEENT: Normal, moist mucous membranes NECK: No JVD CARDIAC: regular rhythm, normal S1 and S2, no rubs or gallops. No murmur. VASCULAR: Radial and DP pulses 2+ bilaterally. No carotid bruits RESPIRATORY:  Clear to auscultation without rales, wheezing or rhonchi  ABDOMEN: Soft, non-tender, non-distended MUSCULOSKELETAL:  Ambulates independently SKIN: Warm and dry, no edema NEUROLOGIC:   Alert and oriented x 3. No focal neuro deficits noted. PSYCHIATRIC:  Normal affect   ASSESSMENT:    1. Precordial pain   2. Type 2 diabetes mellitus without complication, with long-term current use of insulin (HCC)   3. Mixed hyperlipidemia   4. Personal history of COVID-19   5. Counseling on health promotion and disease prevention   6. Cardiac risk counseling    PLAN:    Chest pain, dyspnea on exertion: mild, nonlimiting -both cardiac CT and echo have been personally reviewed. -highly suggests that her symptoms are not cardiac related -encouraged her to continue to try to exercise, etc to increase her conditioning level  Type II diabetes on insulin, managed by Dr. Timothy Lasso -on ramipril for history of microalbuminuria -on empagliflozin, lantus insulin, sitagliptin -on aspirin, rosuvastatin -per [facility], last A1c 9.3 11/22/19  Mixed hyperlipidemia:  -lipids per [facility] 11/22/19, LDL 83, TG 113  Personal history of Covid-19 -mild symptoms, received infusion 03/2020 -has been vaccinated -awaiting results on a Covid test from last week. Symptom of runny nose. Instructed that our policy is the same as Dr. Ferd Hibbs, we do virtual visits while covid tests are pending  Cardiac risk counseling and prevention recommendations: -recommend heart healthy/Mediterranean diet, with whole grains, fruits, vegetable, fish, lean meats, nuts, and olive oil. Limit salt. -recommend moderate walking, 3-5 times/week for 30-50 minutes each session. Aim for at least 150 minutes.week. Goal should be pace of 3 miles/hours, or walking 1.5 miles in 30 minutes -recommend avoidance of tobacco products. Avoid excess alcohol. -ASCVD risk score: The 10-year ASCVD risk score Denman George DC Montez Hageman., et al., 2013) is: 17.2%   Values used to calculate the score:     Age: 25 years     Sex: Female     Is Non-Hispanic African American: Yes     Diabetic: Yes     Tobacco smoker: No     Systolic Blood Pressure: 128 mmHg     Is BP treated:  Yes     HDL Cholesterol: 47 mg/dL     Total Cholesterol: 156 mg/dL    Plan for follow up: 2 years or sooner PRN  Jodelle Red, MD, PhD, [facility]  [facility]   Medication Adjustments/Labs and Tests Ordered: Current medicines are reviewed at length with the patient today.  Concerns regarding medicines are outlined above.  Orders Placed This Encounter  Procedures  . EKG 12-Lead   No orders of the defined types were placed in this encounter.   Patient Instructions  Medication Instructions:  Your Physician recommend you continue on your current medication as directed.     *If you need a refill on your cardiac medications before your next appointment, please call your pharmacy*   Lab Work: None   Testing/Procedures: None   Follow-Up: At [facility], you and your health needs are our priority.  As part of our continuing mission to provide you with exceptional heart care, we have created designated Provider Care Teams.  These Care Teams include your primary Cardiologist (physician) and Advanced Practice Providers (APPs -  Physician Assistants and Nurse Practitioners) who all work together to provide you with the care you need, when you need it.  We recommend signing up for the patient portal called "MyChart".  Sign up information is provided on this After Visit Summary.  MyChart is used to connect with patients for Virtual Visits (Telemedicine).  Patients are able to view lab/test results, encounter notes, upcoming appointments, etc.  Non-urgent messages can be sent to your provider as well.   To learn more about what you can do with MyChart, go to [email or web address].    Your next appointment:   2 year(s)  The format for your next appointment:   In Person  Provider:   Buford Dresser, MD       Signed, Buford Dresser, MD PhD 07/04/2020  [place]

## 2020-07-08 ENCOUNTER — Other Ambulatory Visit: Payer: Self-pay | Admitting: Internal Medicine

## 2020-07-08 DIAGNOSIS — Z1231 Encounter for screening mammogram for malignant neoplasm of breast: Secondary | ICD-10-CM

## 2020-07-11 ENCOUNTER — Other Ambulatory Visit (HOSPITAL_COMMUNITY): Payer: Self-pay | Admitting: Gastroenterology

## 2020-07-11 ENCOUNTER — Other Ambulatory Visit: Payer: Self-pay

## 2020-07-11 ENCOUNTER — Ambulatory Visit
Admission: RE | Admit: 2020-07-11 | Discharge: 2020-07-11 | Disposition: A | Discharge status: Home / Self Care | Payer: 59 | Source: Ambulatory Visit | Attending: Internal Medicine | Admitting: Internal Medicine

## 2020-07-11 DIAGNOSIS — Z1231 Encounter for screening mammogram for malignant neoplasm of breast: Secondary | ICD-10-CM | POA: Diagnosis not present

## 2020-07-11 MED FILL — ZENPEP 40000-126000 UNIT CP: 40000-12600 | 30 days supply | Qty: 240 | Fill #0

## 2020-07-15 ENCOUNTER — Other Ambulatory Visit: Payer: Self-pay | Admitting: Internal Medicine

## 2020-07-15 DIAGNOSIS — R928 Other abnormal and inconclusive findings on diagnostic imaging of breast: Secondary | ICD-10-CM

## 2020-07-18 ENCOUNTER — Ambulatory Visit: Payer: 59 | Admitting: Podiatry

## 2020-07-18 ENCOUNTER — Other Ambulatory Visit: Payer: Self-pay | Admitting: Podiatry

## 2020-07-18 MED FILL — JARDIANCE 10 MG TABLET: 10 | 30 days supply | Qty: 30 | Fill #2

## 2020-07-18 MED FILL — ROSUVASTATIN CALCIUM 40 MG: 40 | 90 days supply | Qty: 90 | Fill #2

## 2020-07-25 ENCOUNTER — Ambulatory Visit
Admission: RE | Admit: 2020-07-25 | Discharge: 2020-07-25 | Disposition: A | Discharge status: Home / Self Care | Payer: 59 | Source: Ambulatory Visit | Attending: Internal Medicine | Admitting: Internal Medicine

## 2020-07-25 ENCOUNTER — Other Ambulatory Visit: Payer: Self-pay | Admitting: Internal Medicine

## 2020-07-25 ENCOUNTER — Other Ambulatory Visit: Payer: Self-pay

## 2020-07-25 DIAGNOSIS — N6310 Unspecified lump in the right breast, unspecified quadrant: Secondary | ICD-10-CM

## 2020-07-25 DIAGNOSIS — R928 Other abnormal and inconclusive findings on diagnostic imaging of breast: Secondary | ICD-10-CM | POA: Diagnosis not present

## 2020-07-25 DIAGNOSIS — N6311 Unspecified lump in the right breast, upper outer quadrant: Secondary | ICD-10-CM | POA: Diagnosis not present

## 2020-07-28 DIAGNOSIS — M545 Low back pain, unspecified: Secondary | ICD-10-CM | POA: Diagnosis not present

## 2020-07-29 DIAGNOSIS — E11319 Type 2 diabetes mellitus with unspecified diabetic retinopathy without macular edema: Secondary | ICD-10-CM | POA: Diagnosis not present

## 2020-07-29 DIAGNOSIS — Z794 Long term (current) use of insulin: Secondary | ICD-10-CM | POA: Diagnosis not present

## 2020-08-08 ENCOUNTER — Other Ambulatory Visit: Payer: Self-pay

## 2020-08-08 ENCOUNTER — Other Ambulatory Visit: Payer: Self-pay | Admitting: Radiology

## 2020-08-08 ENCOUNTER — Ambulatory Visit
Admission: RE | Admit: 2020-08-08 | Discharge: 2020-08-08 | Disposition: A | Discharge status: Home / Self Care | Payer: 59 | Source: Ambulatory Visit | Attending: Internal Medicine | Admitting: Internal Medicine

## 2020-08-08 DIAGNOSIS — N6310 Unspecified lump in the right breast, unspecified quadrant: Secondary | ICD-10-CM

## 2020-08-08 DIAGNOSIS — C50411 Malignant neoplasm of upper-outer quadrant of right female breast: Secondary | ICD-10-CM | POA: Diagnosis not present

## 2020-08-08 HISTORY — PX: BREAST BIOPSY: SHX20

## 2020-08-15 ENCOUNTER — Other Ambulatory Visit: Payer: Self-pay

## 2020-08-15 ENCOUNTER — Other Ambulatory Visit: Payer: Self-pay | Admitting: Hematology and Oncology

## 2020-08-15 ENCOUNTER — Encounter: Payer: Self-pay | Admitting: *Deleted

## 2020-08-15 ENCOUNTER — Inpatient Hospital Stay: Payer: 59 | Attending: Hematology and Oncology | Admitting: Hematology and Oncology

## 2020-08-15 VITALS — BP 144/59 | HR 82 | Temp 98.1°F | Resp 18 | Ht 64.0 in | Wt 166.0 lb

## 2020-08-15 DIAGNOSIS — C50411 Malignant neoplasm of upper-outer quadrant of right female breast: Secondary | ICD-10-CM | POA: Diagnosis not present

## 2020-08-15 DIAGNOSIS — Z880 Allergy status to penicillin: Secondary | ICD-10-CM | POA: Diagnosis not present

## 2020-08-15 DIAGNOSIS — Z170 Estrogen receptor positive status [ER+]: Secondary | ICD-10-CM | POA: Diagnosis not present

## 2020-08-15 DIAGNOSIS — Z801 Family history of malignant neoplasm of trachea, bronchus and lung: Secondary | ICD-10-CM | POA: Diagnosis not present

## 2020-08-15 MED ORDER — LIDOCAINE-PRILOCAINE 2.5-2.5 % EX CREA: TOPICAL_CREAM | CUTANEOUS | 3 refills | Status: DC

## 2020-08-15 MED FILL — LIDOCAINE-PRILOCAINE CREAM: 2.5-2.5 | 30 days supply | Qty: 30 | Fill #0

## 2020-08-15 NOTE — Assessment & Plan Note (Addendum)
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 After reviewing the pathology in detail, we proceeded to discuss the different treatment options between surgery, radiation, chemotherapy, antiestrogen therapies.   Treatment plan: 1.  Neoadjuvant chemotherapy with Taxol and Herceptin weekly x12 followed by Herceptin maintenance 2. breast conserving surgery with some Huntington biopsy 3.  Adjuvant radiation 4.  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  She lives at home and her son visits her often.  Chemotherapy Counseling: I discussed the risks and benefits of chemotherapy including the risks of nausea/ vomiting, risk of infection from low WBC count, fatigue due to chemo or anemia, bruising or bleeding due to low platelets, mouth sores, loss/ change in taste and decreased appetite. Liver and kidney function will be monitored through out chemotherapy as abnormalities in liver and kidney function may be a side effect of treatment. Cardiac dysfunction due to Herceptin and neuropathy risk from Taxol were discussed in detail. Risk of permanent bone marrow dysfunction and leukemia due to chemo were also discussed.  Plan: Echo, chemo class, port  Return to clinic in couple weeks to start chemo.

## 2020-08-15 NOTE — Progress Notes (Signed)
START OFF PATHWAY REGIMEN - Breast   [id number]:Paclitaxel + Trastuzumab:   A cycle is every 28 days:     Paclitaxel      Trastuzumab-xxxx      Trastuzumab-xxxx   **Always confirm dose/schedule in your pharmacy ordering system**  Patient Characteristics: Preoperative or Nonsurgical Candidate (Clinical Staging), Neoadjuvant Therapy followed by Surgery, Invasive Disease, Chemotherapy, HER2 Positive, ER Positive Therapeutic Status: Preoperative or Nonsurgical Candidate (Clinical Staging) AJCC M Category: cM0 AJCC Grade: G3 Breast Surgical Plan: Neoadjuvant Therapy followed by Surgery ER Status: Positive (+) AJCC 8 Stage Grouping: IB HER2 Status: Positive (+) AJCC T Category: cT2 AJCC N Category: cN0 PR Status: Positive (+) Intent of Therapy: Curative Intent, Discussed with Patient

## 2020-08-15 NOTE — Progress Notes (Signed)
[place] NOTE  Patient Care Team: Shon Baton, MD as PCP - General (Internal Medicine) Buford Dresser, MD as PCP - Cardiology (Cardiology)  CHIEF COMPLAINTS/PURPOSE OF CONSULTATION:  Newly diagnosed breast cancer  HISTORY OF PRESENTING ILLNESS:  Rachel Gaines 65 y.o. female is here because of recent diagnosis of right breast cancer.  Patient had a routine screening mammogram detected right breast mass which led to ultrasound.  It showed a 2.2 cm mass in the 10 o'clock position of the right breast.  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  She was seen by Dr. Donne Hazel who referred her to [place] for consideration for neoadjuvant chemotherapy.  I reviewed her records extensively and collaborated the history with the patient.  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 Abnormal Pap smear of cervix 2014   ASCUS with negative HR HPV   . Anemia   . Cataract   . Diabetes mellitus without complication ([place])   . Hyperlipidemia    on medicine  . Plantar fasciitis   . 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 CATARACT EXTRACTION  2019  . COLONOSCOPY    . COLPOSCOPY W/ BIOPSY / CURETTAGE  02/04/1999   Chronic cervicitis    SOCIAL HISTORY: Social History   Socioeconomic History  . Marital status: Single    Spouse name: Not on file  . Number of children: Not on file  . Years of education: Not on file  . Highest education  level: Not on file  Occupational History  . Not on file  Tobacco Use  . Smoking status: Never Smoker  . Smokeless tobacco: Never Used  Substance and Sexual Activity  . Alcohol use: No    Alcohol/week: 0.0 standard drinks  . Drug use: No  . Sexual activity: Not Currently    Birth control/protection: Post-menopausal  Other Topics Concern  . Not on file  Social History Narrative  . Not on file   Social Determinants of Health   Financial Resource Strain: Not on file  Food Insecurity: Not on file  Transportation Needs: Not on file  Physical Activity: Not on file  Stress: Not on file  Social Connections: Not on file  Intimate Partner Violence: Not on file    FAMILY HISTORY: Family History  Problem Relation Age of Onset  . Diabetes Mother   . Diabetes Father   . Heart disease Father   . Cancer Father        Prostate  . Heart failure Father   . Heart disease Sister   . Diabetes Sister   . Diabetes Brother   . Lupus Daughter   . Cancer Brother        lung cancer  . Diabetes Brother   . Diabetes Sister   . Heart disease Sister   . Heart failure Sister   . Thyroid disease Sister   . Diabetes Sister   . Diabetes Sister   . Diabetes Sister   .  Diabetes Sister   . Diabetes Sister   . Diabetes Sister   . Colon cancer Neg Hx   . Colon polyps Neg Hx   . Rectal cancer Neg Hx   . Stomach cancer Neg Hx   . Esophageal cancer Neg Hx     ALLERGIES:  is allergic to penicillins.  MEDICATIONS:  Current Outpatient Medications  Medication Sig Dispense Refill  . aspirin EC 81 MG tablet Take 81 mg by mouth daily.    . Biotin 5 MG CAPS Take 1 capsule by mouth daily.    . Continuous Blood Gluc Sensor (FREESTYLE LIBRE 2 SENSOR) MISC CHANGE EVERY 14 DAYS TO MONITOR BLOOD GLUCOSE CONTINUOSLY    . diclofenac Sodium (VOLTAREN) 1 % GEL SMARTSIG:2 Gram(s) Topical 1 to 4 Times Daily PRN    . empagliflozin (JARDIANCE) 10 MG TABS tablet Take 10 mg by mouth daily.    Marland Kitchen FREESTYLE LITE test  strip     . gabapentin (NEURONTIN) 300 MG capsule Take 300 mg by mouth 3 (three) times daily. Only takes at night    . HUMALOG KWIKPEN 100 UNIT/ML KwikPen SMARTSIG:5 Unit(s) SUB-Q 3 Times Daily    . LANTUS SOLOSTAR 100 UNIT/ML Solostar Pen Inject 34 Units into the skin 2 (two) times daily. If CBG <80 in AM, take only 20 units.   1  . meloxicam (MOBIC) 7.5 MG tablet Take 1 tablet (7.5 mg total) by mouth in the morning and at bedtime. 30 tablet 2  . NONFORMULARY OR COMPOUNDED ITEM [facility]:  Antiinflammatory cream - Diclofenac 3%, Baclofen 2%, Cyclobenzaprine 2%, Lidocaine 2%, dispense 120grams, apply 1-2 grams to affected area 3-4 times a day, +2refills. 120 each 2  . ramipril (ALTACE) 2.5 MG capsule Take 2.5 mg by mouth every other day.     . rosuvastatin (CRESTOR) 40 MG tablet Take 1 tablet by mouth daily.    . sitaGLIPtin (JANUVIA) 100 MG tablet Take 100 mg by mouth daily.    Marland Kitchen UNIFINE PENTIPS 32G X 4 MM MISC USE WITH LEVEMIR AND HUMALOG 4X A DAY DX-E11.65    . valACYclovir (VALTREX) 500 MG tablet Take 1 tablet (500 mg total) by mouth 2 (two) times daily. Take for 3 days for outbreak 30 tablet 2  . VITAMIN D PO Take by mouth.     No current facility-administered medications for this visit.    REVIEW OF SYSTEMS:    All other systems were reviewed with the patient and are negative.  PHYSICAL EXAMINATION: ECOG PERFORMANCE STATUS: 1 - Symptomatic but completely ambulatory  Vitals:   08/15/20 1242  BP: (!) 144/59  Pulse: 82  Resp: 18  Temp: 98.1 F (36.7 C)  SpO2: 97%   Filed Weights   08/15/20 1242  Weight: 166 lb (75.3 kg)       LABORATORY DATA:  I have reviewed the data as listed Lab Results  Component Value Date   WBC 5.8 11/04/2017   HGB 14.9 11/04/2017   HCT 43.8 11/04/2017   MCV 82.6 11/04/2017   PLT 259 11/04/2017   Lab Results  Component Value Date   NA 142 06/18/2019   K 4.0 06/18/2019   CL 105 06/18/2019   CO2 24 06/18/2019    RADIOGRAPHIC  STUDIES: I have personally reviewed the radiological reports and agreed with the findings in the report.  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 After reviewing the pathology in detail, we proceeded to discuss the different treatment options between surgery, radiation, chemotherapy, antiestrogen therapies.   Treatment plan: 1.  Neoadjuvant chemotherapy with Taxol and Herceptin weekly x12 followed by Herceptin maintenance 2. breast conserving surgery with some Huntington biopsy 3.  Adjuvant radiation 4.  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  She lives at home and her son visits her often.  Chemotherapy Counseling: I discussed the risks and benefits of chemotherapy including the risks of nausea/ vomiting, risk of infection from low WBC count, fatigue due to chemo or anemia, bruising or bleeding due to low platelets, mouth sores, loss/ change in taste and decreased appetite. Liver and kidney function will be monitored through out chemotherapy as abnormalities in liver and kidney function may be a side effect of treatment. Cardiac dysfunction due to Herceptin and neuropathy risk from Taxol were discussed in detail. Risk of permanent bone marrow dysfunction and leukemia due to chemo were also discussed.  Plan: Echo, chemo class, port  Return to clinic in couple weeks to start chemo.     All questions were answered. The patient knows to  call the clinic with any problems, questions or concerns.    Harriette Ohara, MD 08/15/20

## 2020-08-18 ENCOUNTER — Other Ambulatory Visit: Payer: Self-pay | Admitting: *Deleted

## 2020-08-18 ENCOUNTER — Encounter: Payer: Self-pay | Admitting: *Deleted

## 2020-08-18 DIAGNOSIS — Z170 Estrogen receptor positive status [ER+]: Secondary | ICD-10-CM

## 2020-08-18 DIAGNOSIS — C50411 Malignant neoplasm of upper-outer quadrant of right female breast: Secondary | ICD-10-CM

## 2020-08-19 ENCOUNTER — Other Ambulatory Visit: Payer: Self-pay | Admitting: Hematology and Oncology

## 2020-08-19 ENCOUNTER — Encounter: Payer: Self-pay | Admitting: *Deleted

## 2020-08-19 ENCOUNTER — Encounter: Payer: Self-pay | Admitting: Licensed Clinical Social Worker

## 2020-08-19 DIAGNOSIS — Z170 Estrogen receptor positive status [ER+]: Secondary | ICD-10-CM

## 2020-08-19 DIAGNOSIS — C50411 Malignant neoplasm of upper-outer quadrant of right female breast: Secondary | ICD-10-CM

## 2020-08-19 MED FILL — JANUVIA 100 MG TABLET: 100 | 30 days supply | Qty: 30 | Fill #0

## 2020-08-19 MED FILL — FREESTYLE LIBRE 2 SENSOR MI: 28 days supply | Qty: 2 | Fill #2

## 2020-08-19 NOTE — Progress Notes (Signed)
[facility] Work  Clinical Social Work was referred by new patient protocol for assessment of psychosocial needs.  Clinical Social Worker contacted patient by phone  to offer support and assess for needs.     Patient works for [employer] and is planning to continue working if possible, depending on how she feels during chemo. She has started the process to get paperwork for [employer] [facility] and does have short-term disability benefit if needed.  No other resource needs noted at this time. Patient stated that she may want someone to talk to as she goes through treatment. CSW encouraged patient to call if she decides she wants to talk and will also monitor pt's schedule. Provided information on support services team and programs and gave direct contact information.    [place], Perrin Worker [employer]

## 2020-08-20 ENCOUNTER — Other Ambulatory Visit: Payer: Self-pay

## 2020-08-20 ENCOUNTER — Encounter (HOSPITAL_BASED_OUTPATIENT_CLINIC_OR_DEPARTMENT_OTHER): Payer: Self-pay | Admitting: General Surgery

## 2020-08-20 NOTE — Progress Notes (Signed)
Pharmacist Chemotherapy Monitoring - Initial Assessment    Anticipated start date: 08/27/20  Regimen:  . Are orders appropriate based on the patient's diagnosis, regimen, and cycle? Yes . Does the plan date match the patient's scheduled date? Yes . Is the sequencing of drugs appropriate? Yes . Are the premedications appropriate for the patient's regimen? Yes . Prior Authorization for treatment is: Pending o If applicable, is the correct biosimilar selected based on the patient's insurance? yes  Organ Function and Labs: Marland Kitchen Are dose adjustments needed based on the patient's renal function, hepatic function, or hematologic function? Yes . Are appropriate labs ordered prior to the start of patient's treatment? Yes . Other organ system assessment, if indicated: trastuzumab: Echo/ MUGA . The following baseline labs, if indicated, have been ordered: N/A  Dose Assessment: . Are the drug doses appropriate? Yes . Are the following correct: o Drug concentrations Yes o IV fluid compatible with drug Yes o Administration routes Yes o Timing of therapy Yes . If applicable, does the patient have documented access for treatment and/or plans for port-a-cath placement? yes . If applicable, have lifetime cumulative doses been properly documented and assessed? not applicable Lifetime Dose Tracking  No doses have been documented on this patient for the following tracked chemicals: Doxorubicin, Epirubicin, Idarubicin, Daunorubicin, Mitoxantrone, Bleomycin, Oxaliplatin, Carboplatin, Liposomal Doxorubicin  o   Toxicity Monitoring/Prevention: . The patient has the following take home antiemetics prescribed: Prochlorperazine . The patient has the following take home medications prescribed: N/A . Medication allergies and previous infusion related reactions, if applicable, have been reviewed and addressed. Yes . The patient's current medication list has been assessed for drug-drug interactions with their  chemotherapy regimen. no significant drug-drug interactions were identified on review.  Order Review: . Are the treatment plan orders signed? Yes . Is the patient scheduled to see a provider prior to their treatment? Yes  I verify that I have reviewed each item in the above checklist and answered each question accordingly.  Philomena Course, [place], 08/20/2020  3:01 PM

## 2020-08-21 ENCOUNTER — Telehealth: Payer: Self-pay | Admitting: *Deleted

## 2020-08-21 ENCOUNTER — Other Ambulatory Visit: Payer: Self-pay | Admitting: *Deleted

## 2020-08-21 ENCOUNTER — Other Ambulatory Visit: Payer: Self-pay | Admitting: Hematology and Oncology

## 2020-08-21 MED ORDER — ONDANSETRON HCL 8 MG PO TABS: 8.0000 mg | ORAL_TABLET | Freq: Three times a day (TID) | ORAL | 0 refills | Status: DC | PRN

## 2020-08-21 MED ORDER — PROCHLORPERAZINE MALEATE 10 MG PO TABS: 10.0000 mg | ORAL_TABLET | Freq: Four times a day (QID) | ORAL | 0 refills | Status: DC | PRN

## 2020-08-21 MED ORDER — LIDOCAINE-PRILOCAINE 2.5-2.5 % EX CREA: 1.0000 "application " | TOPICAL_CREAM | CUTANEOUS | 0 refills | Status: DC | PRN

## 2020-08-21 MED FILL — PROCHLORPERAZINE 10 MG TAB: 10 | 7 days supply | Qty: 30 | Fill #0

## 2020-08-21 MED FILL — ONDANSETRON HCL 8 MG TABLET: 8 | 10 days supply | Qty: 30 | Fill #0

## 2020-08-21 NOTE — Telephone Encounter (Signed)
Attempted to call patient but unable to leave voicemail because it was full.

## 2020-08-21 NOTE — Progress Notes (Signed)
The following biosimilar Kanjinti (trastuzumab-anns) has been selected for use in this patient.  Kennith Center, Pharm.D., CPP 08/21/2020@9 :20 AM

## 2020-08-22 ENCOUNTER — Encounter: Payer: Self-pay | Admitting: Licensed Clinical Social Worker

## 2020-08-22 ENCOUNTER — Other Ambulatory Visit (HOSPITAL_COMMUNITY)
Admission: RE | Admit: 2020-08-22 | Discharge: 2020-08-22 | Disposition: A | Discharge status: Home / Self Care | Payer: 59 | Source: Ambulatory Visit | Attending: General Surgery | Admitting: General Surgery

## 2020-08-22 ENCOUNTER — Inpatient Hospital Stay: Payer: 59

## 2020-08-22 ENCOUNTER — Other Ambulatory Visit: Payer: Self-pay

## 2020-08-22 ENCOUNTER — Other Ambulatory Visit (HOSPITAL_COMMUNITY): Payer: 59

## 2020-08-22 ENCOUNTER — Encounter (HOSPITAL_BASED_OUTPATIENT_CLINIC_OR_DEPARTMENT_OTHER)
Admission: RE | Admit: 2020-08-22 | Discharge: 2020-08-22 | Disposition: A | Discharge status: Home / Self Care | Payer: 59 | Source: Ambulatory Visit | Attending: General Surgery | Admitting: General Surgery

## 2020-08-22 ENCOUNTER — Ambulatory Visit (HOSPITAL_COMMUNITY)
Admission: RE | Admit: 2020-08-22 | Discharge: 2020-08-22 | Disposition: A | Discharge status: Home / Self Care | Payer: 59 | Source: Ambulatory Visit | Attending: Hematology and Oncology | Admitting: Hematology and Oncology

## 2020-08-22 DIAGNOSIS — Z01812 Encounter for preprocedural laboratory examination: Secondary | ICD-10-CM | POA: Insufficient documentation

## 2020-08-22 DIAGNOSIS — Z20822 Contact with and (suspected) exposure to covid-19: Secondary | ICD-10-CM | POA: Insufficient documentation

## 2020-08-22 DIAGNOSIS — Z01818 Encounter for other preprocedural examination: Secondary | ICD-10-CM | POA: Insufficient documentation

## 2020-08-22 DIAGNOSIS — C50411 Malignant neoplasm of upper-outer quadrant of right female breast: Secondary | ICD-10-CM

## 2020-08-22 DIAGNOSIS — E119 Type 2 diabetes mellitus without complications: Secondary | ICD-10-CM | POA: Insufficient documentation

## 2020-08-22 DIAGNOSIS — I10 Essential (primary) hypertension: Secondary | ICD-10-CM | POA: Insufficient documentation

## 2020-08-22 DIAGNOSIS — Z170 Estrogen receptor positive status [ER+]: Secondary | ICD-10-CM

## 2020-08-22 LAB — BASIC METABOLIC PANEL
Anion gap: 7 (ref 5–15)
BUN: 7 mg/dL — ABNORMAL LOW (ref 8–23)
CO2: 29 mmol/L (ref 22–32)
Calcium: 9.8 mg/dL (ref 8.9–10.3)
Chloride: 106 mmol/L (ref 98–111)
Creatinine, Ser: 0.77 mg/dL (ref 0.44–1.00)
GFR, Estimated: >60 mL/min (ref >60)
Glucose, Bld: 125 mg/dL — ABNORMAL HIGH (ref 70–99)
Potassium: 3.7 mmol/L (ref 3.5–5.1)
Sodium: 142 mmol/L (ref 135–145)

## 2020-08-22 LAB — SARS CORONAVIRUS 2 (TAT 6-24 HRS): SARS Coronavirus 2: NEGATIVE

## 2020-08-22 LAB — ECHOCARDIOGRAM COMPLETE
Area-P 1/2: 2.99 cm2
S' Lateral: 2.5 cm

## 2020-08-22 NOTE — Progress Notes (Signed)
[place] Psychosocial Distress Screening Clinical Social Work  Clinical Social Work was referred by distress screening protocol.  The patient scored a 8 on the Psychosocial Distress Thermometer which indicates severe distress. Clinical Social Worker completed assessment with patient on 08/19/2020. Provided information on support services and direct contact information at that time.  [facility] DISTRESS SCREENING 08/22/2020  Screening Type Initial Screening  Distress experienced in past week (1-10) 8  Emotional problem type Nervousness/Anxiety;Isolation/feeling alone  Referral to clinical social work Yes     Clinical Social Worker follow up needed: Yes.    If yes, follow up plan: CSW will plan to see pt in infusion on 3/2.    Sohaib Vereen E Liborio Saccente, LCSW

## 2020-08-22 NOTE — Progress Notes (Signed)
  Echocardiogram 2D Echocardiogram has been performed.  Rachel Gaines 08/22/2020, 11:06 AM

## 2020-08-22 NOTE — Progress Notes (Signed)

## 2020-08-25 ENCOUNTER — Other Ambulatory Visit: Payer: Self-pay | Admitting: General Surgery

## 2020-08-25 NOTE — Anesthesia Preprocedure Evaluation (Addendum)
Anesthesia Evaluation  Patient identified by MRN, date of birth, ID band Patient awake    Reviewed: Allergy & Precautions, NPO status , Patient's Chart, lab work & pertinent test results  Airway Mallampati: II  TM Distance: >3 FB Neck ROM: Full    Dental no notable dental hx. (+) Dental Advisory Given, Upper Dentures   Pulmonary    Pulmonary exam normal breath sounds clear to auscultation       Cardiovascular Exercise Tolerance: Good Normal cardiovascular exam Rhythm:Regular Rate:Normal     Neuro/Psych  Headaches, PSYCHIATRIC DISORDERS Depression  Neuromuscular disease    GI/Hepatic negative GI ROS, Neg liver ROS,   Endo/Other  diabetes, Well Controlled, Type 1  Renal/GU negative Renal ROS     Musculoskeletal negative musculoskeletal ROS (+)   Abdominal   Peds  Hematology  (+) anemia ,   Anesthesia Other Findings Breast ca  Reproductive/Obstetrics                           Anesthesia Physical Anesthesia Plan  ASA: III  Anesthesia Plan: General   Post-op Pain Management:    Induction: Intravenous  PONV Risk Score and Plan: 4 or greater and Treatment may vary due to age or medical condition, Midazolam, Dexamethasone and Ondansetron  Airway Management Planned: LMA  Additional Equipment: None  Intra-op Plan:   Post-operative Plan:   Informed Consent: I have reviewed the patients History and Physical, chart, labs and discussed the procedure including the risks, benefits and alternatives for the proposed anesthesia with the patient or authorized representative who has indicated his/her understanding and acceptance.     Dental advisory given  Plan Discussed with: CRNA and Anesthesiologist  Anesthesia Plan Comments:        Anesthesia Quick Evaluation

## 2020-08-26 ENCOUNTER — Encounter (HOSPITAL_BASED_OUTPATIENT_CLINIC_OR_DEPARTMENT_OTHER)
Admission: RE | Disposition: A | Discharge status: Home / Self Care | Payer: Self-pay | Source: Ambulatory Visit | Attending: General Surgery

## 2020-08-26 ENCOUNTER — Other Ambulatory Visit (HOSPITAL_BASED_OUTPATIENT_CLINIC_OR_DEPARTMENT_OTHER): Payer: Self-pay | Admitting: General Surgery

## 2020-08-26 ENCOUNTER — Ambulatory Visit (HOSPITAL_BASED_OUTPATIENT_CLINIC_OR_DEPARTMENT_OTHER)
Admission: RE | Admit: 2020-08-26 | Discharge: 2020-08-26 | Disposition: A | Discharge status: Home / Self Care | Payer: 59 | Source: Ambulatory Visit | Attending: General Surgery | Admitting: General Surgery

## 2020-08-26 ENCOUNTER — Ambulatory Visit (HOSPITAL_COMMUNITY): Payer: 59

## 2020-08-26 ENCOUNTER — Other Ambulatory Visit: Payer: Self-pay

## 2020-08-26 ENCOUNTER — Ambulatory Visit (HOSPITAL_BASED_OUTPATIENT_CLINIC_OR_DEPARTMENT_OTHER): Payer: 59 | Admitting: Anesthesiology

## 2020-08-26 ENCOUNTER — Encounter (HOSPITAL_BASED_OUTPATIENT_CLINIC_OR_DEPARTMENT_OTHER): Payer: Self-pay | Admitting: General Surgery

## 2020-08-26 DIAGNOSIS — Z452 Encounter for adjustment and management of vascular access device: Secondary | ICD-10-CM | POA: Diagnosis not present

## 2020-08-26 DIAGNOSIS — Z880 Allergy status to penicillin: Secondary | ICD-10-CM | POA: Insufficient documentation

## 2020-08-26 DIAGNOSIS — Z794 Long term (current) use of insulin: Secondary | ICD-10-CM | POA: Insufficient documentation

## 2020-08-26 DIAGNOSIS — Z95828 Presence of other vascular implants and grafts: Secondary | ICD-10-CM

## 2020-08-26 DIAGNOSIS — Z79899 Other long term (current) drug therapy: Secondary | ICD-10-CM | POA: Diagnosis not present

## 2020-08-26 DIAGNOSIS — Z170 Estrogen receptor positive status [ER+]: Secondary | ICD-10-CM | POA: Diagnosis not present

## 2020-08-26 DIAGNOSIS — E782 Mixed hyperlipidemia: Secondary | ICD-10-CM | POA: Diagnosis not present

## 2020-08-26 DIAGNOSIS — C50411 Malignant neoplasm of upper-outer quadrant of right female breast: Secondary | ICD-10-CM | POA: Diagnosis not present

## 2020-08-26 DIAGNOSIS — E109 Type 1 diabetes mellitus without complications: Secondary | ICD-10-CM | POA: Diagnosis not present

## 2020-08-26 HISTORY — PX: PORTACATH PLACEMENT: SHX2246

## 2020-08-26 LAB — GLUCOSE, CAPILLARY
Glucose-Capillary: 139 mg/dL — ABNORMAL HIGH (ref 70–99)
Glucose-Capillary: 116 mg/dL — ABNORMAL HIGH (ref 70–99)

## 2020-08-26 SURGERY — INSERTION, TUNNELED CENTRAL VENOUS DEVICE, WITH PORT
Anesthesia: General | Site: Breast

## 2020-08-26 MED ORDER — DEXAMETHASONE SODIUM PHOSPHATE 4 MG/ML IJ SOLN
INTRAMUSCULAR | Status: DC | PRN
  Administered 2020-08-26: 4 mg via INTRAVENOUS

## 2020-08-26 MED ORDER — EPHEDRINE 5 MG/ML INJ
INTRAVENOUS | Status: AC
  Filled 2020-08-26: qty 10

## 2020-08-26 MED ORDER — ACETAMINOPHEN 500 MG PO TABS
ORAL_TABLET | ORAL | Status: AC
  Filled 2020-08-26: qty 2

## 2020-08-26 MED ORDER — CIPROFLOXACIN IN D5W 400 MG/200ML IV SOLN
400.0000 mg | INTRAVENOUS | Status: AC
  Administered 2020-08-26: 600 mg via INTRAVENOUS
  Administered 2020-08-26: 400 mg via INTRAVENOUS

## 2020-08-26 MED ORDER — HEPARIN (PORCINE) IN NACL 2-0.9 UNITS/ML
INTRAMUSCULAR | Status: AC | PRN
  Administered 2020-08-26: 1 via INTRAVENOUS

## 2020-08-26 MED ORDER — PROPOFOL 10 MG/ML IV BOLUS
INTRAVENOUS | Status: DC | PRN
  Administered 2020-08-26: 100 mg via INTRAVENOUS

## 2020-08-26 MED ORDER — HEPARIN SOD (PORK) LOCK FLUSH 100 UNIT/ML IV SOLN
INTRAVENOUS | Status: DC | PRN
  Administered 2020-08-26: 500 [IU]

## 2020-08-26 MED ORDER — LACTATED RINGERS IV SOLN: INTRAVENOUS | Status: DC

## 2020-08-26 MED ORDER — LIDOCAINE 2% (20 MG/ML) 5 ML SYRINGE
INTRAMUSCULAR | Status: AC
  Filled 2020-08-26: qty 5

## 2020-08-26 MED ORDER — ONDANSETRON HCL 4 MG/2ML IJ SOLN
INTRAMUSCULAR | Status: AC
  Filled 2020-08-26: qty 4

## 2020-08-26 MED ORDER — ONDANSETRON HCL 4 MG/2ML IJ SOLN: 4.0000 mg | Freq: Once | INTRAMUSCULAR | Status: DC | PRN

## 2020-08-26 MED ORDER — BUPIVACAINE HCL (PF) 0.25 % IJ SOLN
INTRAMUSCULAR | Status: AC
  Filled 2020-08-26: qty 30

## 2020-08-26 MED ORDER — FENTANYL CITRATE (PF) 100 MCG/2ML IJ SOLN
INTRAMUSCULAR | Status: AC
  Filled 2020-08-26: qty 2

## 2020-08-26 MED ORDER — FENTANYL CITRATE (PF) 100 MCG/2ML IJ SOLN
INTRAMUSCULAR | Status: DC | PRN
  Administered 2020-08-26 (×3): 50 ug via INTRAVENOUS

## 2020-08-26 MED ORDER — ACETAMINOPHEN 500 MG PO TABS
1000.0000 mg | ORAL_TABLET | ORAL | Status: AC
  Administered 2020-08-26: 1000 mg via ORAL

## 2020-08-26 MED ORDER — MIDAZOLAM HCL 2 MG/2ML IJ SOLN
INTRAMUSCULAR | Status: AC
  Filled 2020-08-26: qty 2

## 2020-08-26 MED ORDER — HEPARIN SOD (PORK) LOCK FLUSH 100 UNIT/ML IV SOLN
INTRAVENOUS | Status: AC
  Filled 2020-08-26: qty 5

## 2020-08-26 MED ORDER — ACETAMINOPHEN 10 MG/ML IV SOLN
1000.0000 mg | Freq: Once | INTRAVENOUS | Status: DC | PRN
Start: 2020-08-26 — End: 2020-08-26

## 2020-08-26 MED ORDER — CIPROFLOXACIN IN D5W 400 MG/200ML IV SOLN
INTRAVENOUS | Status: AC
  Filled 2020-08-26: qty 200

## 2020-08-26 MED ORDER — TRAMADOL HCL 50 MG PO TABS: 50.0000 mg | ORAL_TABLET | Freq: Four times a day (QID) | ORAL | 0 refills | Status: DC | PRN

## 2020-08-26 MED ORDER — HEPARIN (PORCINE) IN NACL 1000-0.9 UT/500ML-% IV SOLN
INTRAVENOUS | Status: AC
  Filled 2020-08-26: qty 500

## 2020-08-26 MED ORDER — BUPIVACAINE HCL (PF) 0.25 % IJ SOLN
INTRAMUSCULAR | Status: DC | PRN
  Administered 2020-08-26: 7 mL

## 2020-08-26 MED ORDER — MIDAZOLAM HCL 5 MG/5ML IJ SOLN
INTRAMUSCULAR | Status: DC | PRN
  Administered 2020-08-26: 1 mg via INTRAVENOUS

## 2020-08-26 MED ORDER — ENSURE PRE-SURGERY PO LIQD: 296.0000 mL | Freq: Once | ORAL | Status: DC

## 2020-08-26 MED ORDER — PROPOFOL 10 MG/ML IV BOLUS
INTRAVENOUS | Status: AC
  Filled 2020-08-26: qty 20

## 2020-08-26 MED ORDER — ONDANSETRON HCL 4 MG/2ML IJ SOLN
INTRAMUSCULAR | Status: DC | PRN
  Administered 2020-08-26: 4 mg via INTRAVENOUS

## 2020-08-26 MED ORDER — FENTANYL CITRATE (PF) 100 MCG/2ML IJ SOLN: 25.0000 ug | INTRAMUSCULAR | Status: DC | PRN

## 2020-08-26 MED ORDER — PHENYLEPHRINE 40 MCG/ML (10ML) SYRINGE FOR IV PUSH (FOR BLOOD PRESSURE SUPPORT)
PREFILLED_SYRINGE | INTRAVENOUS | Status: AC
  Filled 2020-08-26: qty 20

## 2020-08-26 MED FILL — traMADol HCL 50 MG TABS: 50 | 3 days supply | Qty: 10 | Fill #0

## 2020-08-26 SURGICAL SUPPLY — 51 items
ADH SKN CLS APL DERMABOND .7 (GAUZE/BANDAGES/DRESSINGS) ×1
APL PRP STRL LF DISP 70% ISPRP (MISCELLANEOUS) ×1
APL SKNCLS STERI-STRIP NONHPOA (GAUZE/BANDAGES/DRESSINGS) ×1
BAG DECANTER FOR FLEXI CONT (MISCELLANEOUS) ×2 IMPLANT
BENZOIN TINCTURE PRP APPL 2/3 (GAUZE/BANDAGES/DRESSINGS) ×2 IMPLANT
BLADE SURG 11 STRL SS (BLADE) ×2 IMPLANT
BLADE SURG 15 STRL LF DISP TIS (BLADE) ×1 IMPLANT
BLADE SURG 15 STRL SS (BLADE) ×2
CANISTER SUCT 1200ML W/VALVE (MISCELLANEOUS) IMPLANT
CHLORAPREP W/TINT 26 (MISCELLANEOUS) ×2 IMPLANT
COVER BACK TABLE 60X90IN (DRAPES) ×2 IMPLANT
COVER MAYO STAND STRL (DRAPES) ×2 IMPLANT
COVER PROBE 5X48 (MISCELLANEOUS)
COVER WAND RF STERILE (DRAPES) IMPLANT
DECANTER SPIKE VIAL GLASS SM (MISCELLANEOUS) IMPLANT
DERMABOND ADVANCED (GAUZE/BANDAGES/DRESSINGS) ×1
DERMABOND ADVANCED .7 DNX12 (GAUZE/BANDAGES/DRESSINGS) ×1 IMPLANT
DRAPE C-ARM 42X72 X-RAY (DRAPES) ×2 IMPLANT
DRAPE LAPAROSCOPIC ABDOMINAL (DRAPES) ×2 IMPLANT
DRAPE UTILITY XL STRL (DRAPES) ×2 IMPLANT
DRSG TEGADERM 4X4.75 (GAUZE/BANDAGES/DRESSINGS) IMPLANT
ELECT COATED BLADE 2.86 ST (ELECTRODE) ×2 IMPLANT
ELECT REM PT RETURN 9FT ADLT (ELECTROSURGICAL) ×2
ELECTRODE REM PT RTRN 9FT ADLT (ELECTROSURGICAL) ×1 IMPLANT
GAUZE SPONGE 4X4 12PLY STRL LF (GAUZE/BANDAGES/DRESSINGS) ×2 IMPLANT
GLOVE SURG ENC MOIS LTX SZ7 (GLOVE) ×2 IMPLANT
GLOVE SURG UNDER POLY LF SZ7.5 (GLOVE) ×2 IMPLANT
GOWN STRL REUS W/ TWL LRG LVL3 (GOWN DISPOSABLE) ×2 IMPLANT
GOWN STRL REUS W/TWL LRG LVL3 (GOWN DISPOSABLE) ×4
IV KIT MINILOC 20X1 SAFETY (NEEDLE) IMPLANT
KIT CVR 48X5XPRB PLUP LF (MISCELLANEOUS) IMPLANT
KIT PORT POWER 8FR ISP CVUE (Port) ×1 IMPLANT
NDL HYPO 25X1 1.5 SAFETY (NEEDLE) ×1 IMPLANT
NDL SAFETY ECLIPSE 18X1.5 (NEEDLE) IMPLANT
NEEDLE HYPO 18GX1.5 SHARP (NEEDLE)
NEEDLE HYPO 25X1 1.5 SAFETY (NEEDLE) ×2 IMPLANT
PACK BASIN DAY SURGERY FS (CUSTOM PROCEDURE TRAY) ×2 IMPLANT
PENCIL SMOKE EVACUATOR (MISCELLANEOUS) ×2 IMPLANT
SLEEVE SCD COMPRESS KNEE MED (STOCKING) ×2 IMPLANT
STRIP CLOSURE SKIN 1/2X4 (GAUZE/BANDAGES/DRESSINGS) ×2 IMPLANT
SUT MNCRL AB 4-0 PS2 18 (SUTURE) ×2 IMPLANT
SUT PROLENE 2 0 SH DA (SUTURE) ×2 IMPLANT
SUT SILK 2 0 TIES 17X18 (SUTURE)
SUT SILK 2-0 18XBRD TIE BLK (SUTURE) IMPLANT
SUT VIC AB 3-0 SH 27 (SUTURE) ×2
SUT VIC AB 3-0 SH 27X BRD (SUTURE) ×1 IMPLANT
SYR 5ML LUER SLIP (SYRINGE) ×2 IMPLANT
SYR CONTROL 10ML LL (SYRINGE) ×2 IMPLANT
TOWEL GREEN STERILE FF (TOWEL DISPOSABLE) ×2 IMPLANT
TUBE CONNECTING 20X1/4 (TUBING) IMPLANT
YANKAUER SUCT BULB TIP NO VENT (SUCTIONS) IMPLANT

## 2020-08-26 NOTE — Discharge Instructions (Signed)
No Tylenol before 6:30pm today.           PORT-A-CATH: POST OP INSTRUCTIONS  Always review your discharge instruction sheet given to you by the facility where your surgery was performed.   1. A prescription for pain medication may be given to you upon discharge. Take your pain medication as prescribed, if needed. If narcotic pain medicine is not needed, then you make take acetaminophen (Tylenol) or ibuprofen (Advil) as needed.  2. Take your usually prescribed medications unless otherwise directed. 3. If you need a refill on your pain medication, please contact our office. All narcotic pain medicine now requires a paper prescription.  Phoned in and fax refills are no longer allowed by law.  Prescriptions will not be filled after 5 pm or on weekends.  4. You should follow a light diet for the remainder of the day after your procedure. 5. Most patients will experience some mild swelling and/or bruising in the area of the incision. It may take several days to resolve. 6. It is common to experience some constipation if taking pain medication after surgery. Increasing fluid intake and taking a stool softener (such as Colace) will usually help or prevent this problem from occurring. A mild laxative (Milk of Magnesia or Miralax) should be taken according to package directions if there are no bowel movements after 48 hours.  7. Unless discharge instructions indicate otherwise, you may remove your bandages 48 hours after surgery, and you may shower at that time. You may have steri-strips (small white skin tapes) in place directly over the incision.  These strips should be left on the skin for 7-10 days.  If your surgeon used Dermabond (skin glue) on the incision, you may shower in 24 hours.  The glue will flake off over the next 2-3 weeks.  8. If your port is left accessed at the end of surgery (needle left in port), the dressing cannot get wet and should only by changed by a healthcare professional.  When the port is no longer accessed (when the needle has been removed), follow step 7.   9. ACTIVITIES:  Limit activity involving your arms for the next 72 hours. Do no strenuous exercise or activity for 1 week. You may drive when you are no longer taking prescription pain medication, you can comfortably wear a seatbelt, and you can maneuver your car. 10.You may need to see your doctor in the office for a follow-up appointment.  Please       check with your doctor.  11.When you receive a new Port-a-Cath, you will get a product guide and        ID card.  Please keep them in case you need them.  WHEN TO CALL YOUR DOCTOR [phone]): 1. Fever over 101.0 2. Chills 3. Continued bleeding from incision 4. Increased redness and tenderness at the site 5. Shortness of breath, difficulty breathing   The clinic staff is available to answer your questions during regular business hours. Please don't hesitate to call and ask to speak to one of the nurses or medical assistants for clinical concerns. If you have a medical emergency, go to the nearest emergency room or call 911.  A surgeon from [facility] is always on call at the hospital.     For further information, please visit [email or web address]

## 2020-08-26 NOTE — Interval H&P Note (Signed)
History and Physical Interval Note:  08/26/2020 11:57 AM  Rachel Gaines  has presented today for surgery, with the diagnosis of BREAST CANCER.  The various methods of treatment have been discussed with the patient and family. After consideration of risks, benefits and other options for treatment, the patient has consented to  Procedure(s) with comments: INSERTION PORT-A-CATH (N/A) - START TIME OF 12:30 PM FOR 60 MINUTES ROOM 8 as a surgical intervention.  The patient's history has been reviewed, patient examined, no change in status, stable for surgery.  I have reviewed the patient's chart and labs.  Questions were answered to the patient's satisfaction.     Rolm Bookbinder

## 2020-08-26 NOTE — Assessment & Plan Note (Signed)
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  Neoadjuvant chemotherapy with Taxol and Herceptin weekly x12 followed by Herceptin maintenance 2. breast conserving surgery with some [place] biopsy 3.  Adjuvant radiation 4.  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

## 2020-08-26 NOTE — Anesthesia Postprocedure Evaluation (Signed)
Anesthesia Post Note  Patient: Rachel Gaines  Procedure(s) Performed: INSERTION PORT-A-CATH (N/A Breast)     Patient location during evaluation: PACU Anesthesia Type: General Level of consciousness: awake and alert Pain management: pain level controlled Vital Signs Assessment: post-procedure vital signs reviewed and stable Respiratory status: spontaneous breathing, nonlabored ventilation, respiratory function stable and patient connected to nasal cannula oxygen Cardiovascular status: blood pressure returned to baseline and stable Postop Assessment: no apparent nausea or vomiting Anesthetic complications: no   No complications documented.  Last Vitals:  Vitals:   08/26/20 1405 08/26/20 1415  BP:  119/72  Pulse: 82 85  Resp: (!) 9 16  Temp:    SpO2: 100% 95%    Last Pain:  Vitals:   08/26/20 1415  TempSrc:   PainSc: 0-No pain                 Barnet Glasgow

## 2020-08-26 NOTE — Transfer of Care (Signed)
Immediate Anesthesia Transfer of Care Note  Patient: Dayanne Yiu  Procedure(s) Performed: INSERTION PORT-A-CATH (N/A Breast)  Patient Location: PACU  Anesthesia Type:General  Level of Consciousness: drowsy and patient cooperative  Airway & Oxygen Therapy: Patient Spontanous Breathing and Patient connected to face mask oxygen  Post-op Assessment: Report given to RN and Post -op Vital signs reviewed and stable  Post vital signs: Reviewed and stable  Last Vitals:  Vitals Value Taken Time  BP 99/63 08/26/20 1347  Temp    Pulse 88 08/26/20 1349  Resp 11 08/26/20 1349  SpO2 100 % 08/26/20 1349  Vitals shown include unvalidated device data.  Last Pain:  Vitals:   08/26/20 1222  TempSrc: Oral  PainSc: 0-No pain         Complications: No complications documented.

## 2020-08-26 NOTE — Op Note (Signed)
Preoperative diagnosisbreast cancer Postoperative diagnosis: Same as above Procedure: Left IJ port placement Surgeon: Dr. Serita Grammes Anesthesia: General  Estimated blood loss:minimal Specimens:none Sponge and needlecount was correct atcompletion Drains: None Disposition recovery stable condition  Indications:64 yof with her 2 positive breast cancer. Discussed primary systemic therapy.  Discussed port placement.  Procedure: After informed consent was obtainedshe was taken to the OR.She was given antibiotics. SCDs were placed. She was placed under general anesthesia without complication. She was prepped and draped in the standard sterile surgical fashion. A surgical timeout was then performed.  I used the ultrasound to identify theleftinternal jugular vein. Under ultrasound guidance I then accessed the vein with the needle. I passed the wire and confirmed this was in the IJ with ultrasound. Fluoroscopy confirmed in correct position. . I thenmade an incisionon herleft chest.I tunneled the line between the 2 sites. I then placed the dilator over the wire. I observed this with fluoroscopy to go in the correct position. I then removedthe wire. I then passed the line. The peel-away sheath was removed. The line went into the left IJ. It appeared to have flipped back. I rewired the line and placed in correct position under fluoro. I pulled the line back to be in the superior vena cava. I then attached the port. I sutured this into place with 2-0 Prolene. I then closed this with 3-0 Vicryl and 4-0 Monocryl. Glue was placed. Final fluoroscopic image showed the port to be in good position. I then accessed the port and was able to aspirate blood and packed this with heparin.

## 2020-08-26 NOTE — H&P (Signed)
.36 yof who has no prior breast history, no mass or dc had her screening mm. she has b-c density breasts. she was noted to have a 2.3 cm uoq breast mass in ruoq. this is 2.2 cm by [place]. [place] axilla by negative. biopsy of the mass is II-III IDC with DCIS that is er pos at 84, pr pos at 79, her 2 pos, and Ki is 15%. 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  Past Surgical History Illene Regulus, CMA; 08/15/2020 1:18 PM) Breast Biopsy  Right. Colon Polyp Removal - Colonoscopy   Diagnostic Studies History Illene Regulus, CMA; 08/15/2020 1:18 PM) Colonoscopy  1-5 years ago Mammogram  within last year Pap Smear  1-5 years ago  Allergies Janeann Forehand, CNA; 08/15/2020 10:25 AM) No Known Drug Allergies  [08/15/2020]: (Marked as Inactive) Allergies Reconciled  Penicillins   Medication History Janeann Forehand, CNA; 08/15/2020 10:23 AM) Albuterol Sulfate HFA (108 (90 Base)MCG/ACT Aerosol Soln, Inhalation) Active. Basaglar KwikPen (100UNIT/ML Soln Pen-inj, Subcutaneous) Active. Celecoxib (100MG  Capsule, Oral) Active. Benzonatate (200MG  Capsule, Oral) Active. FreeStyle Libre 2 Sensor Active. Terconazole (0.4% Cream, Vaginal) Active. Cyclobenzaprine HCl (5MG  Tablet, Oral) Active. Diclofenac Sodium (1% Gel, External) Active. Doxycycline Hyclate (100MG  Tablet, Oral) Active. Fluconazole (150MG  Tablet, Oral) Active. Gabapentin (300MG  Capsule, Oral) Active. HumaLOG KwikPen (100UNIT/ML Soln Pen-inj, Subcutaneous) Active. Januvia (100MG  Tablet, Oral) Active. Jardiance (10MG  Tablet, Oral) Active. levoFLOXacin (500MG  Tablet, Oral) Active. Meloxicam (7.5MG  Tablet, Oral) Active. methylPREDNISolone (4MG  Tab Ther Pack, Oral) Active. Ramipril (2.5MG  Capsule, Oral) Active. Rosuvastatin Calcium (40MG  Tablet, Oral) Active. Unifine Pentips (32G X 4 MM Misc,) Active. Unifine Pentips (31G X 8 MM Misc,) Active. valACYclovir HCl (1GM Tablet, Oral)  Active. valACYclovir HCl (500MG  Tablet, Oral) Active. Zenpep (40000-126000 UNIT Capsule DR Part, Oral) Active. Medications Reconciled  Social History Illene Regulus, CMA; 08/15/2020 1:18 PM) Alcohol use  Occasional alcohol use. Caffeine use  Coffee, Tea. No drug use  Tobacco use  Never smoker.  Family History Illene Regulus, [place]; 08/15/2020 1:18 PM) Alcohol Abuse  Father. Diabetes Mellitus  Brother, Father. Heart Disease  Brother, Father. Heart disease in female family member before age 25  Hypertension  Sister. Prostate Cancer  Father.  Pregnancy / Birth History Illene Regulus, CMA; 08/15/2020 1:18 PM) Age at menarche  65 years. Age of menopause  44-50 Gravida  3 Length (months) of breastfeeding  3-6 Maternal age  102-20 Para  2  Other Problems Illene Regulus, CMA; 08/15/2020 1:18 PM) Breast Cancer  Diabetes Mellitus  Sickle cell disease    Review of Systems (Alisha Spillers CMA; 08/15/2020 1:18 PM) General Not Present- Appetite Loss, Chills, Fatigue, Fever, Night Sweats, Weight Gain and Weight Loss. Skin Not Present- Change in Wart/Mole, Dryness, Hives, Jaundice, New Lesions, Non-Healing Wounds, Rash and Ulcer. HEENT Present- Wears glasses/contact lenses. Not Present- Earache, Hearing Loss, Hoarseness, Nose Bleed, Oral Ulcers, Ringing in the Ears, Seasonal Allergies, Sinus Pain, Sore Throat, Visual Disturbances and Yellow Eyes. Respiratory Not Present- Bloody sputum, Chronic Cough, Difficulty Breathing, Snoring and Wheezing. Breast Present- Breast Mass. Not Present- Breast Pain, Nipple Discharge and Skin Changes. Cardiovascular Not Present- Chest Pain, Difficulty Breathing Lying Down, Leg Cramps, Palpitations, Rapid Heart Rate, Shortness of Breath and Swelling of Extremities. Female Genitourinary Not Present- Frequency, Nocturia, Painful Urination, Pelvic Pain and Urgency. Musculoskeletal Not Present- Back Pain, Joint Pain, Joint Stiffness, Muscle  Pain, Muscle Weakness and Swelling of Extremities. Neurological Not Present- Decreased Memory, Fainting, Headaches, Numbness, Seizures, Tingling, Tremor, Trouble walking and Weakness.  Psychiatric Not Present- Anxiety, Bipolar, Change in Sleep Pattern, Depression, Fearful and Frequent crying. Endocrine Not Present- Cold Intolerance, Excessive Hunger, Hair Changes, Heat Intolerance, Hot flashes and New Diabetes. Hematology Not Present- Blood Thinners, Easy Bruising, Excessive bleeding, Gland problems, HIV and Persistent Infections.  Vitals Adriana Reams [place] CNA; 08/15/2020 10:26 AM) 08/15/2020 10:25 AM Weight: 166.13 lb Height: 62in Body Surface Area: 1.77 m Body Mass Index: 30.38 kg/m  Temp.: 98.84F  Pulse: 83 (Regular)  P.OX: 97% (Room air) BP: 130/64(Sitting, Left Arm, Standard) Physical Exam Rolm Bookbinder MD; 08/15/2020 10:31 AM) General Mental Status-Alert. Orientation-Oriented X3. Breast Nipples-No Discharge. Note: no left breast mass vague ruoq breast mass about 2 cm mobile with some hematoma Lymphatic Head & Neck General Head & Neck Lymphatics: Bilateral - Description - Normal. Axillary General Axillary Region: Bilateral - Description - Normal. Note: no Felsenthal adenopathy   Assessment & Plan Rolm Bookbinder MD; 08/15/2020 3:44 PM) BREAST CANCER OF UPPER-OUTER QUADRANT OF RIGHT FEMALE BREAST (C50.411) Story: Port placement, primary systemic therapy followed by likely lumpectomy/sn biopsy We discussed the staging and pathophysiology of breast cancer. We discussed all of the different options for treatment for breast cancer including surgery, chemotherapy, radiation therapy, Herceptin, and antiestrogen therapy. We discussed a sentinel lymph node biopsy at the time of surgery. We discussed the performance of that with injection of radioactive tracer. We discussed that there is a chance of having a positive node with a sentinel lymph node biopsy and we will await  the permanent pathology to make any other first further decisions in terms of her treatment. We discussed up to a 5% risk lifetime of chronic shoulder pain as well as lymphedema associated with a sentinel lymph node biopsy. We discussed the options for treatment of the breast cancer which included lumpectomy versus a mastectomy. We discussed the performance of the lumpectomy with radioactive seed placement. We discussed a 5-10% chance of a positive margin requiring reexcision in the operating room. We also discussed that she will likely need radiation therapy if she undergoes lumpectomy. We discussed mastectomy and the postoperative care for that as well. Mastectomy can be followed by reconstruction. The decision for lumpectomy vs mastectomy has no impact on decision for chemotherapy. Most mastectomy patients will not need radiation therapy. We discussed that there is no difference in her survival whether she undergoes lumpectomy with radiation therapy or antiestrogen therapy versus a mastectomy. There is also no real difference between her recurrence in the breast. we also discussed possible reduction in associated with lumpectomy for T2 her 2 pos cancer discussed primary systemic therapy and oncology agreeable to this. will plan for port placement

## 2020-08-26 NOTE — Progress Notes (Signed)
Patient Care Team: Shon Baton, MD as PCP - General (Internal Medicine) Buford Dresser, MD as PCP - Cardiology (Cardiology) Mauro Kaufmann, RN as Oncology Nurse Navigator Rockwell Germany, RN as Oncology Nurse Navigator  DIAGNOSIS:    ICD-10-CM   1. Malignant neoplasm of upper-outer quadrant of right breast in female, estrogen receptor positive ([place])  C50.411    Z17.0     SUMMARY OF ONCOLOGIC HISTORY: Oncology History  Malignant neoplasm of upper-outer quadrant of right breast in female, estrogen receptor positive ([place])  08/08/2020 Initial Diagnosis   Screening detected right breast mass 2.2 cm by ultrasound upper outer quadrant biopsy: Grade 2-3 IDC with DCIS ER 60%, PR 20%, Ki-67 15%, HER-2 +3+   08/15/2020 Cancer Staging   Staging form: Breast, AJCC 8th Edition - Clinical stage from 08/15/2020: Stage IB (cT2, cN0, cM0, G3, ER+, PR+, HER2+) - Signed by Nicholas Lose, MD on 08/15/2020 Stage prefix: Initial diagnosis   08/27/2020 -  Chemotherapy    Patient is on Treatment Plan: BREAST PACLITAXEL + TRASTUZUMAB Q7D / TRASTUZUMAB Q21D        CHIEF COMPLIANT: Cycle 1 Taxol Herceptin  INTERVAL HISTORY: Rachel Gaines is a 65 y.o. with above-mentioned history of right breast cancer currently on neoadjuvant chemotherapy with Taxol Herceptin. Echo on 08/22/20 showed an ejection fraction of 55-60%. Her port was placed by Dr. Donne Hazel on 08/26/20. She presents to the clinic today for cycle 1.  She is feeling well.  She is anxious to get start of the treatment and worries about side effects of treatment.  ALLERGIES:  is allergic to penicillins.  MEDICATIONS:  Current Outpatient Medications  Medication Sig Dispense Refill  . aspirin EC 81 MG tablet Take 81 mg by mouth daily.    . Biotin 5 MG CAPS Take 1 capsule by mouth daily.    . Continuous Blood Gluc Sensor (FREESTYLE LIBRE 2 SENSOR) MISC CHANGE EVERY 14 DAYS TO MONITOR BLOOD GLUCOSE CONTINUOSLY    . diclofenac Sodium (VOLTAREN)  1 % GEL SMARTSIG:2 Gram(s) Topical 1 to 4 Times Daily PRN    . empagliflozin (JARDIANCE) 10 MG TABS tablet Take 10 mg by mouth daily.    Marland Kitchen FREESTYLE LITE test strip     . gabapentin (NEURONTIN) 300 MG capsule Take 300 mg by mouth at bedtime. Only takes at night    . HUMALOG KWIKPEN 100 UNIT/ML KwikPen 10 Units 3 (three) times daily.    Marland Kitchen LANTUS SOLOSTAR 100 UNIT/ML Solostar Pen Inject 34 Units into the skin 2 (two) times daily. If CBG <80 in AM, take only 20 units.   1  . lidocaine-prilocaine (EMLA) cream Apply 1 application topically as needed. 30 g 0  . NONFORMULARY OR COMPOUNDED ITEM [facility]:  Antiinflammatory cream - Diclofenac 3%, Baclofen 2%, Cyclobenzaprine 2%, Lidocaine 2%, dispense 120grams, apply 1-2 grams to affected area 3-4 times a day, +2refills. 120 each 2  . ondansetron (ZOFRAN) 8 MG tablet Take 1 tablet (8 mg total) by mouth every 8 (eight) hours as needed for nausea or vomiting. 30 tablet 0  . prochlorperazine (COMPAZINE) 10 MG tablet Take 1 tablet (10 mg total) by mouth every 6 (six) hours as needed for nausea or vomiting. 30 tablet 0  . ramipril (ALTACE) 2.5 MG capsule Take 2.5 mg by mouth every other day.     . rosuvastatin (CRESTOR) 40 MG tablet Take 1 tablet by mouth daily.    . sitaGLIPtin (JANUVIA) 100 MG tablet Take 100 mg by mouth daily.    Marland Kitchen  traMADol (ULTRAM) 50 MG tablet Take 1 tablet (50 mg total) by mouth every 6 (six) hours as needed. 10 tablet 0  . UNIFINE PENTIPS 32G X 4 MM MISC USE WITH LEVEMIR AND HUMALOG 4X A DAY DX-E11.65    . valACYclovir (VALTREX) 500 MG tablet Take 1 tablet (500 mg total) by mouth 2 (two) times daily. Take for 3 days for outbreak 30 tablet 2  . VITAMIN D PO Take by mouth.     No current facility-administered medications for this visit.    PHYSICAL EXAMINATION: ECOG PERFORMANCE STATUS: 1 - Symptomatic but completely ambulatory  Vitals:   08/27/20 0805  BP: 137/64  Pulse: 98  Resp: 18  Temp: 97.7 F (36.5 C)  SpO2: 97%    Filed Weights   08/27/20 0805  Weight: 167 lb 14.4 oz (76.2 kg)    LABORATORY DATA:  I have reviewed the data as listed CMP Latest Ref Rng & Units 08/22/2020 06/18/2019 04/27/2019  Glucose 70 - 99 mg/dL 125(H) 119(H) 169(H)  BUN 8 - 23 mg/dL 7(L) 10 5(L)  Creatinine 0.44 - 1.00 mg/dL 0.77 0.71 0.68  Sodium 135 - 145 mmol/L 142 142 146(H)  Potassium 3.5 - 5.1 mmol/L 3.7 4.0 4.2  Chloride 98 - 111 mmol/L 106 105 105  CO2 22 - 32 mmol/L '29 24 24  ' Calcium 8.9 - 10.3 mg/dL 9.8 10.2 9.9  Total Protein 6.1 - 8.1 g/dL - - -  Total Bilirubin 0.2 - 1.2 mg/dL - - -  AST 10 - 35 U/L - - -  ALT 6 - 29 U/L - - -    Lab Results  Component Value Date   WBC 7.8 08/27/2020   HGB 14.1 08/27/2020   HCT 42.3 08/27/2020   MCV 82.6 08/27/2020   PLT 244 08/27/2020   NEUTROABS 6.1 08/27/2020    ASSESSMENT & PLAN:  Malignant neoplasm of upper-outer quadrant of right breast in female, estrogen receptor positive ([place]) 08/08/2020:Screening detected right breast mass 2.2 cm by ultrasound upper outer quadrant biopsy: Grade 2-3 IDC with DCIS ER 60%, PR 20%, Ki-67 15%, HER-2 +3+ T2N0 stage Ia Treatment plan: 1.  Neoadjuvant chemotherapy with Taxol and Herceptin weekly x12 followed by Herceptin maintenance 2. breast conserving surgery with some [place] biopsy 3.  Adjuvant radiation 4.  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  The patient has a good understanding of the overall plan. she agrees with it. she will call with any problems that may develop before the next visit here.  Total time spent: 30 mins including face to face time and  time spent for planning, charting and coordination of care  Rulon Eisenmenger, MD, MPH 08/27/2020  I, Cloyde Reams Dorshimer, am acting as scribe for Dr. Nicholas Lose.  I have reviewed the above documentation for accuracy and completeness, and I agree with the above.

## 2020-08-26 NOTE — Anesthesia Procedure Notes (Signed)
Procedure Name: LMA Insertion Date/Time: 08/26/2020 12:49 PM Performed by: Maryella Shivers, CRNA Pre-anesthesia Checklist: Patient identified, Emergency Drugs available, Suction available and Patient being monitored Patient Re-evaluated:Patient Re-evaluated prior to induction Oxygen Delivery Method: Circle system utilized Preoxygenation: Pre-oxygenation with 100% oxygen Induction Type: IV induction Ventilation: Mask ventilation without difficulty LMA: LMA inserted LMA Size: 4.0 Number of attempts: 1 Airway Equipment and Method: Bite block Placement Confirmation: positive ETCO2 Tube secured with: Tape Dental Injury: Teeth and Oropharynx as per pre-operative assessment

## 2020-08-27 ENCOUNTER — Inpatient Hospital Stay: Payer: 59

## 2020-08-27 ENCOUNTER — Other Ambulatory Visit: Payer: Self-pay

## 2020-08-27 ENCOUNTER — Encounter: Payer: Self-pay | Admitting: *Deleted

## 2020-08-27 ENCOUNTER — Inpatient Hospital Stay (HOSPITAL_BASED_OUTPATIENT_CLINIC_OR_DEPARTMENT_OTHER): Payer: 59 | Admitting: Hematology and Oncology

## 2020-08-27 ENCOUNTER — Encounter (HOSPITAL_BASED_OUTPATIENT_CLINIC_OR_DEPARTMENT_OTHER): Payer: Self-pay | Admitting: General Surgery

## 2020-08-27 ENCOUNTER — Encounter: Payer: Self-pay | Admitting: Licensed Clinical Social Worker

## 2020-08-27 ENCOUNTER — Inpatient Hospital Stay: Payer: 59 | Attending: Hematology and Oncology

## 2020-08-27 VITALS — BP 143/69 | HR 79 | Temp 98.2°F | Resp 16

## 2020-08-27 DIAGNOSIS — Z170 Estrogen receptor positive status [ER+]: Secondary | ICD-10-CM | POA: Insufficient documentation

## 2020-08-27 DIAGNOSIS — Z95828 Presence of other vascular implants and grafts: Secondary | ICD-10-CM

## 2020-08-27 DIAGNOSIS — C50411 Malignant neoplasm of upper-outer quadrant of right female breast: Secondary | ICD-10-CM | POA: Diagnosis not present

## 2020-08-27 DIAGNOSIS — Z801 Family history of malignant neoplasm of trachea, bronchus and lung: Secondary | ICD-10-CM | POA: Insufficient documentation

## 2020-08-27 DIAGNOSIS — Z880 Allergy status to penicillin: Secondary | ICD-10-CM | POA: Diagnosis not present

## 2020-08-27 DIAGNOSIS — Z5112 Encounter for antineoplastic immunotherapy: Secondary | ICD-10-CM | POA: Insufficient documentation

## 2020-08-27 LAB — CBC WITH DIFFERENTIAL (CANCER CENTER ONLY)
Abs Immature Granulocytes: 0.03 10*3/uL (ref 0.00–0.07)
Basophils Absolute: 0 10*3/uL (ref 0.0–0.1)
Basophils Relative: 0 %
Eosinophils Absolute: 0 10*3/uL (ref 0.0–0.5)
Eosinophils Relative: 0 %
HCT: 42.3 % (ref 36.0–46.0)
Hemoglobin: 14.1 g/dL (ref 12.0–15.0)
Immature Granulocytes: 0 %
Lymphocytes Relative: 15 %
Lymphs Abs: 1.1 10*3/uL (ref 0.7–4.0)
MCH: 27.5 pg (ref 26.0–34.0)
MCHC: 33.3 g/dL (ref 30.0–36.0)
MCV: 82.6 fL (ref 80.0–100.0)
Monocytes Absolute: 0.5 10*3/uL (ref 0.1–1.0)
Monocytes Relative: 6 %
Neutro Abs: 6.1 10*3/uL (ref 1.7–7.7)
Neutrophils Relative %: 79 %
Platelet Count: 244 10*3/uL (ref 150–400)
RBC: 5.12 MIL/uL — ABNORMAL HIGH (ref 3.87–5.11)
RDW: 13.7 % (ref 11.5–15.5)
WBC Count: 7.8 10*3/uL (ref 4.0–10.5)
nRBC: 0 % (ref 0.0–0.2)

## 2020-08-27 LAB — CMP (CANCER CENTER ONLY)
ALT: 22 U/L (ref 0–44)
AST: 18 U/L (ref 15–41)
Albumin: 3.9 g/dL (ref 3.5–5.0)
Alkaline Phosphatase: 99 U/L (ref 38–126)
Anion gap: 11 (ref 5–15)
BUN: 12 mg/dL (ref 8–23)
CO2: 22 mmol/L (ref 22–32)
Calcium: 9.7 mg/dL (ref 8.9–10.3)
Chloride: 106 mmol/L (ref 98–111)
Creatinine: 0.96 mg/dL (ref 0.44–1.00)
GFR, Estimated: >60 mL/min (ref >60)
Glucose, Bld: 400 mg/dL — ABNORMAL HIGH (ref 70–99)
Potassium: 3.7 mmol/L (ref 3.5–5.1)
Sodium: 139 mmol/L (ref 135–145)
Total Bilirubin: 1.2 mg/dL (ref 0.3–1.2)
Total Protein: 7.7 g/dL (ref 6.5–8.1)

## 2020-08-27 LAB — GLUCOSE, CAPILLARY: Glucose-Capillary: 258 mg/dL — ABNORMAL HIGH (ref 70–99)

## 2020-08-27 MED ORDER — SODIUM CHLORIDE 0.9% FLUSH
10.0000 mL | Freq: Once | INTRAVENOUS | Status: AC
  Administered 2020-08-27: 10 mL via INTRAVENOUS
  Filled 2020-08-27: qty 10

## 2020-08-27 MED ORDER — FAMOTIDINE IN NACL 20-0.9 MG/50ML-% IV SOLN
20.0000 mg | Freq: Once | INTRAVENOUS | Status: AC
Start: 2020-08-27 — End: 2020-08-27
  Administered 2020-08-27: 20 mg via INTRAVENOUS

## 2020-08-27 MED ORDER — DIPHENHYDRAMINE HCL 50 MG/ML IJ SOLN
INTRAMUSCULAR | Status: AC
  Filled 2020-08-27: qty 1

## 2020-08-27 MED ORDER — FAMOTIDINE IN NACL 20-0.9 MG/50ML-% IV SOLN
INTRAVENOUS | Status: AC
  Filled 2020-08-27: qty 50

## 2020-08-27 MED ORDER — SODIUM CHLORIDE 0.9% FLUSH
10.0000 mL | INTRAVENOUS | Status: DC | PRN
  Administered 2020-08-27: 10 mL
  Filled 2020-08-27: qty 10

## 2020-08-27 MED ORDER — INSULIN ASPART 100 UNIT/ML ~~LOC~~ SOLN
6.0000 [IU] | Freq: Once | SUBCUTANEOUS | Status: AC
  Administered 2020-08-27: 6 [IU] via SUBCUTANEOUS

## 2020-08-27 MED ORDER — INSULIN ASPART 100 UNIT/ML ~~LOC~~ SOLN
SUBCUTANEOUS | Status: AC
  Filled 2020-08-27: qty 1

## 2020-08-27 MED ORDER — SODIUM CHLORIDE 0.9 % IV SOLN
10.0000 mg | Freq: Once | INTRAVENOUS | Status: DC
  Filled 2020-08-27: qty 1

## 2020-08-27 MED ORDER — HEPARIN SOD (PORK) LOCK FLUSH 100 UNIT/ML IV SOLN
500.0000 [IU] | Freq: Once | INTRAVENOUS | Status: AC | PRN
  Administered 2020-08-27: 500 [IU]
  Filled 2020-08-27: qty 5

## 2020-08-27 MED ORDER — DIPHENHYDRAMINE HCL 50 MG/ML IJ SOLN
25.0000 mg | Freq: Once | INTRAMUSCULAR | Status: AC
  Administered 2020-08-27: 25 mg via INTRAVENOUS

## 2020-08-27 MED ORDER — SODIUM CHLORIDE 0.9 % IV SOLN
80.0000 mg/m2 | Freq: Once | INTRAVENOUS | Status: AC
  Administered 2020-08-27: 150 mg via INTRAVENOUS
  Filled 2020-08-27: qty 25

## 2020-08-27 MED ORDER — TRASTUZUMAB-ANNS CHEMO 150 MG IV SOLR
300.0000 mg | Freq: Once | INTRAVENOUS | Status: AC
Start: 2020-08-27 — End: 2020-08-27
  Administered 2020-08-27: 300 mg via INTRAVENOUS
  Filled 2020-08-27: qty 14.29

## 2020-08-27 MED ORDER — ACETAMINOPHEN 325 MG PO TABS
ORAL_TABLET | ORAL | Status: AC
  Filled 2020-08-27: qty 2

## 2020-08-27 MED ORDER — ACETAMINOPHEN 325 MG PO TABS
650.0000 mg | ORAL_TABLET | Freq: Once | ORAL | Status: AC
  Administered 2020-08-27: 650 mg via ORAL

## 2020-08-27 MED ORDER — SODIUM CHLORIDE 0.9 % IV SOLN
Freq: Once | INTRAVENOUS | Status: AC
  Filled 2020-08-27: qty 250

## 2020-08-27 NOTE — Patient Instructions (Signed)
[facility] Discharge Instructions for Patients Receiving Chemotherapy  Today you received the following chemotherapy agents Trastuzumab & Paclitaxel  To help prevent nausea and vomiting after your treatment, we encourage you to take your nausea medication as prescribed.   If you develop nausea and vomiting that is not controlled by your nausea medication, call the clinic.   BELOW ARE SYMPTOMS THAT SHOULD BE REPORTED IMMEDIATELY:  *FEVER GREATER THAN 100.5 F  *CHILLS WITH OR WITHOUT FEVER  NAUSEA AND VOMITING THAT IS NOT CONTROLLED WITH YOUR NAUSEA MEDICATION  *UNUSUAL SHORTNESS OF BREATH  *UNUSUAL BRUISING OR BLEEDING  TENDERNESS IN MOUTH AND THROAT WITH OR WITHOUT PRESENCE OF ULCERS  *URINARY PROBLEMS  *BOWEL PROBLEMS  UNUSUAL RASH Items with * indicate a potential emergency and should be followed up as soon as possible.  Feel free to call the clinic should you have any questions or concerns. The clinic phone number is [phone].  Please show the [facility] at check-in to the Emergency Department and triage nurse.  Trastuzumab injection for infusion What is this medicine? TRASTUZUMAB (tras TOO zoo mab) is a monoclonal antibody. It is used to treat breast cancer and stomach cancer. This medicine may be used for other purposes; ask your health care provider or pharmacist if you have questions. COMMON BRAND NAME(S): Herceptin, Galvin Proffer, Trazimera What should I tell my health care provider before I take this medicine? They need to know if you have any of these conditions:  heart disease  heart failure  lung or breathing disease, like asthma  an unusual or allergic reaction to trastuzumab, benzyl alcohol, or other medications, foods, dyes, or preservatives  pregnant or trying to get pregnant  breast-feeding How should I use this medicine? This drug is given as an infusion into a vein. It is administered in a  hospital or clinic by a specially trained health care professional. Talk to your pediatrician regarding the use of this medicine in children. This medicine is not approved for use in children. Overdosage: If you think you have taken too much of this medicine contact a poison control center or emergency room at once. NOTE: This medicine is only for you. Do not share this medicine with others. What if I miss a dose? It is important not to miss a dose. Call your doctor or health care professional if you are unable to keep an appointment. What may interact with this medicine? This medicine may interact with the following medications:  certain types of chemotherapy, such as daunorubicin, doxorubicin, epirubicin, and idarubicin This list may not describe all possible interactions. Give your health care provider a list of all the medicines, herbs, non-prescription drugs, or dietary supplements you use. Also tell them if you smoke, drink alcohol, or use illegal drugs. Some items may interact with your medicine. What should I watch for while using this medicine? Visit your doctor for checks on your progress. Report any side effects. Continue your course of treatment even though you feel ill unless your doctor tells you to stop. Call your doctor or health care professional for advice if you get a fever, chills or sore throat, or other symptoms of a cold or flu. Do not treat yourself. Try to avoid being around people who are sick. You may experience fever, chills and shaking during your first infusion. These effects are usually mild and can be treated with other medicines. Report any side effects during the infusion to your health care professional. Fever  and chills usually do not happen with later infusions. Do not become pregnant while taking this medicine or for 7 months after stopping it. Women should inform their doctor if they wish to become pregnant or think they might be pregnant. Women of child-bearing  potential will need to have a negative pregnancy test before starting this medicine. There is a potential for serious side effects to an unborn child. Talk to your health care professional or pharmacist for more information. Do not breast-feed an infant while taking this medicine or for 7 months after stopping it. Women must use effective birth control with this medicine. What side effects may I notice from receiving this medicine? Side effects that you should report to your doctor or health care professional as soon as possible:  allergic reactions like skin rash, itching or hives, swelling of the face, lips, or tongue  chest pain or palpitations  cough  dizziness  feeling faint or lightheaded, falls  fever  general ill feeling or flu-like symptoms  signs of worsening heart failure like breathing problems; swelling in your legs and feet  unusually weak or tired Side effects that usually do not require medical attention (report to your doctor or health care professional if they continue or are bothersome):  bone pain  changes in taste  diarrhea  joint pain  nausea/vomiting  weight loss This list may not describe all possible side effects. Call your doctor for medical advice about side effects. You may report side effects to FDA at 1-800-FDA-1088. Where should I keep my medicine? This drug is given in a hospital or clinic and will not be stored at home. NOTE: This sheet is a summary. It may not cover all possible information. If you have questions about this medicine, talk to your doctor, pharmacist, or health care provider.  2021 Elsevier/Gold Standard (2016-06-08 14:37:52)  Paclitaxel injection What is this medicine? PACLITAXEL (PAK li TAX el) is a chemotherapy drug. It targets fast dividing cells, like cancer cells, and causes these cells to die. This medicine is used to treat ovarian cancer, breast cancer, lung cancer, Kaposi's sarcoma, and other cancers. This medicine  may be used for other purposes; ask your health care provider or pharmacist if you have questions. COMMON BRAND NAME(S): Onxol, Taxol What should I tell my health care provider before I take this medicine? They need to know if you have any of these conditions:  history of irregular heartbeat  liver disease  low blood counts, like low white cell, platelet, or red cell counts  lung or breathing disease, like asthma  tingling of the fingers or toes, or other nerve disorder  an unusual or allergic reaction to paclitaxel, alcohol, polyoxyethylated castor oil, other chemotherapy, other medicines, foods, dyes, or preservatives  pregnant or trying to get pregnant  breast-feeding How should I use this medicine? This drug is given as an infusion into a vein. It is administered in a hospital or clinic by a specially trained health care professional. Talk to your pediatrician regarding the use of this medicine in children. Special care may be needed. Overdosage: If you think you have taken too much of this medicine contact a poison control center or emergency room at once. NOTE: This medicine is only for you. Do not share this medicine with others. What if I miss a dose? It is important not to miss your dose. Call your doctor or health care professional if you are unable to keep an appointment. What may interact with this medicine?  Do not take this medicine with any of the following medications:  live virus vaccines This medicine may also interact with the following medications:  antiviral medicines for hepatitis, HIV or AIDS  certain antibiotics like erythromycin and clarithromycin  certain medicines for fungal infections like ketoconazole and itraconazole  certain medicines for seizures like carbamazepine, phenobarbital, phenytoin  gemfibrozil  nefazodone  rifampin  St. John's wort This list may not describe all possible interactions. Give your health care provider a list of all  the medicines, herbs, non-prescription drugs, or dietary supplements you use. Also tell them if you smoke, drink alcohol, or use illegal drugs. Some items may interact with your medicine. What should I watch for while using this medicine? Your condition will be monitored carefully while you are receiving this medicine. You will need important blood work done while you are taking this medicine. This medicine can cause serious allergic reactions. To reduce your risk you will need to take other medicine(s) before treatment with this medicine. If you experience allergic reactions like skin rash, itching or hives, swelling of the face, lips, or tongue, tell your doctor or health care professional right away. In some cases, you may be given additional medicines to help with side effects. Follow all directions for their use. This drug may make you feel generally unwell. This is not uncommon, as chemotherapy can affect healthy cells as well as cancer cells. Report any side effects. Continue your course of treatment even though you feel ill unless your doctor tells you to stop. Call your doctor or health care professional for advice if you get a fever, chills or sore throat, or other symptoms of a cold or flu. Do not treat yourself. This drug decreases your body's ability to fight infections. Try to avoid being around people who are sick. This medicine may increase your risk to bruise or bleed. Call your doctor or health care professional if you notice any unusual bleeding. Be careful brushing and flossing your teeth or using a toothpick because you may get an infection or bleed more easily. If you have any dental work done, tell your dentist you are receiving this medicine. Avoid taking products that contain aspirin, acetaminophen, ibuprofen, naproxen, or ketoprofen unless instructed by your doctor. These medicines may hide a fever. Do not become pregnant while taking this medicine. Women should inform their  doctor if they wish to become pregnant or think they might be pregnant. There is a potential for serious side effects to an unborn child. Talk to your health care professional or pharmacist for more information. Do not breast-feed an infant while taking this medicine. Men are advised not to father a child while receiving this medicine. This product may contain alcohol. Ask your pharmacist or healthcare provider if this medicine contains alcohol. Be sure to tell all healthcare providers you are taking this medicine. Certain medicines, like metronidazole and disulfiram, can cause an unpleasant reaction when taken with alcohol. The reaction includes flushing, headache, nausea, vomiting, sweating, and increased thirst. The reaction can last from 30 minutes to several hours. What side effects may I notice from receiving this medicine? Side effects that you should report to your doctor or health care professional as soon as possible:  allergic reactions like skin rash, itching or hives, swelling of the face, lips, or tongue  breathing problems  changes in vision  fast, irregular heartbeat  high or low blood pressure  mouth sores  pain, tingling, numbness in the hands or feet  signs  of decreased platelets or bleeding - bruising, pinpoint red spots on the skin, black, tarry stools, blood in the urine  signs of decreased red blood cells - unusually weak or tired, feeling faint or lightheaded, falls  signs of infection - fever or chills, cough, sore throat, pain or difficulty passing urine  signs and symptoms of liver injury like dark yellow or brown urine; general ill feeling or flu-like symptoms; light-colored stools; loss of appetite; nausea; right upper belly pain; unusually weak or tired; yellowing of the eyes or skin  swelling of the ankles, feet, hands  unusually slow heartbeat Side effects that usually do not require medical attention (report to your doctor or health care professional if  they continue or are bothersome):  diarrhea  hair loss  loss of appetite  muscle or joint pain  nausea, vomiting  pain, redness, or irritation at site where injected  tiredness This list may not describe all possible side effects. Call your doctor for medical advice about side effects. You may report side effects to FDA at 1-800-FDA-1088. Where should I keep my medicine? This drug is given in a hospital or clinic and will not be stored at home. NOTE: This sheet is a summary. It may not cover all possible information. If you have questions about this medicine, talk to your doctor, pharmacist, or health care provider.  2021 Elsevier/Gold Standard (2019-05-16 13:37:23)

## 2020-08-27 NOTE — Patient Instructions (Signed)

## 2020-08-27 NOTE — Progress Notes (Signed)
CBG 258 one hour after insulin administration.

## 2020-08-27 NOTE — Progress Notes (Signed)
Pt Chest Xray pending for port tip placement.  RN contacted Dr. Donne Hazel who states tip of port a cath is in the SVC per fluoroscopic imaging.  Per Dr. Lindi Adie, okay to proceed with tx with Dr. Melissa Montane recommendations.

## 2020-08-27 NOTE — Progress Notes (Signed)
Pt blood sugar 600 this morning.  Pt states she has taken Lantus 34 units and Humalog 10 units at 0600.  Per MD pt to not receive 10 of IV dex with tx today.  Pt to receive an additional 6 units of Novolog and re check FSBS one hour post.  MD also states pt needing to be seen by PCP this week for further evaluation of diabetes management.  Infusion RN Bethena Roys notified and states she will inform pt.

## 2020-08-27 NOTE — Progress Notes (Signed)
Elevated blood glucose; hold Dex per Dr. Lindi Adie.  Kennith Center, Pharm.D., CPP 08/27/2020@8 :59 AM

## 2020-08-27 NOTE — Progress Notes (Signed)
[facility] CSW Progress Note  Holiday representative met with patient in infusion to check on coping during first treatment. Patient reports to be doing okay right now. She did note that she is trying to get FMLA paperwork and said that there is a chance she might need transportation help in the future but not at this time. CSW provided contact information for patient to call if she does need transportation help. Provided calendars for [facility] wellness groups and classes for March.     Christeen Douglas , LCSW

## 2020-08-28 ENCOUNTER — Telehealth: Payer: Self-pay | Admitting: *Deleted

## 2020-08-28 ENCOUNTER — Other Ambulatory Visit (HOSPITAL_COMMUNITY): Payer: Self-pay | Admitting: Internal Medicine

## 2020-08-28 MED FILL — JARDIANCE 10 MG TABLET: 10 | 30 days supply | Qty: 30 | Fill #3

## 2020-08-28 MED FILL — BASAGLAR 100 UNIT/ML KWIKPE: 100 | 49 days supply | Qty: 33 | Fill #0

## 2020-08-28 NOTE — Telephone Encounter (Signed)
RN attempt x1 to f/u with pt post first dose of chemotherapy yesterday.  No answer.  Unable to LVM due to VM not being set up.

## 2020-08-28 NOTE — Telephone Encounter (Signed)
RN placed call to pt to f.u post chemotherapy tx yesterday. Pt states port is sore, denies redness, warmth to the touch or drainage.  RN educated pt to apple ice pack to port and to monitor for s/s of infection and to call the office if symptoms develop. Pt states she is also slightly nauseated.  RN reviewed PRN antiemetics with pt and encouraged pt to take dose now. Also educated pt on increasing p.o oral intake.  Pt verbalized understanding and appreciative of the advice.

## 2020-08-29 ENCOUNTER — Ambulatory Visit: Payer: 59

## 2020-08-29 ENCOUNTER — Ambulatory Visit: Payer: 59 | Admitting: Podiatry

## 2020-09-03 ENCOUNTER — Other Ambulatory Visit: Payer: 59

## 2020-09-03 ENCOUNTER — Ambulatory Visit: Payer: 59 | Admitting: Hematology and Oncology

## 2020-09-03 ENCOUNTER — Other Ambulatory Visit (HOSPITAL_COMMUNITY): Payer: Self-pay | Admitting: Internal Medicine

## 2020-09-04 ENCOUNTER — Other Ambulatory Visit: Payer: Self-pay

## 2020-09-04 ENCOUNTER — Encounter: Payer: Self-pay | Admitting: Physical Therapy

## 2020-09-04 ENCOUNTER — Ambulatory Visit: Payer: 59 | Attending: Hematology and Oncology | Admitting: Physical Therapy

## 2020-09-04 DIAGNOSIS — Z170 Estrogen receptor positive status [ER+]: Secondary | ICD-10-CM | POA: Diagnosis not present

## 2020-09-04 DIAGNOSIS — R293 Abnormal posture: Secondary | ICD-10-CM | POA: Insufficient documentation

## 2020-09-04 DIAGNOSIS — C50411 Malignant neoplasm of upper-outer quadrant of right female breast: Secondary | ICD-10-CM | POA: Diagnosis not present

## 2020-09-04 NOTE — Therapy (Signed)
[place], [place], [address] Phone: [phone]   Fax:  [phone]  Physical Therapy Evaluation  Patient Details  Name: Rachel Gaines MRN: [id number] Date of Birth: 11-21-1955 Referring Provider (PT): Lindi Adie   Encounter Date: 09/04/2020   PT End of Session - 09/04/20 0850    Visit Number 1    Number of Visits 2    Date for PT Re-Evaluation 01/04/21    PT Start Time 0805    PT Stop Time 0845    PT Time Calculation (min) 40 min    Activity Tolerance Patient tolerated treatment well    Behavior During Therapy [facility] for tasks assessed/performed           Past Medical History:  Diagnosis Date  . Abnormal Pap smear of cervix 2014   ASCUS with negative HR HPV   . Anemia   . Cancer [facility]) 06/2020   right breast IDC with DCIS  . Cataract   . Diabetes mellitus without complication ([place])   . Hyperlipidemia    on medicine  . Plantar fasciitis   . 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 CATARACT EXTRACTION  2019  . COLONOSCOPY    . COLPOSCOPY W/ BIOPSY / CURETTAGE  02/04/1999   Chronic cervicitis  . EYE SURGERY    . PORTACATH PLACEMENT N/A 08/26/2020   Procedure: INSERTION PORT-A-CATH;  Surgeon: Rolm Bookbinder, MD;  Location: [place];  Service: General;  Laterality: N/A;  START TIME OF 12:30 PM FOR 60 MINUTES ROOM 8    There were no vitals filed for this visit.    Subjective Assessment - 09/04/20 0808    Subjective I have 12 weeks of chemo, then surgery and then radiation.    Pertinent History R breast cancer, neoadjuvant chemo, grade 2-3 DCIS ER/PR+, will need radiation and antiestrogen treatment, diabetes    Patient Stated Goals to gain info from provider    Currently in Pain? No/denies    Pain Score 0-No pain              [facility] PT Assessment - 09/04/20 0001      Assessment   Medical Diagnosis R  breast cancer    Referring Provider (PT) Gudena    Onset Date/Surgical Date 07/30/20    Hand Dominance Right    Prior Therapy none      Precautions   Precautions Other (comment)    Precaution Comments active cancer      Restrictions   Weight Bearing Restrictions No      Balance Screen   Has the patient fallen in the past 6 months No    Has the patient had a decrease in activity level because of a fear of falling?  No    Is the patient reluctant to leave their home because of a fear of falling?  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, blood work, [employer]    Leisure pt walks dog 2x/day for  about 20 min      Cognition   Overall Cognitive Status Within Functional Limits for tasks assessed      Posture/Postural Control   Posture/Postural Control Postural limitations    Postural Limitations Rounded Shoulders;Forward head      ROM / Strength   AROM / PROM / Strength AROM      AROM   AROM Assessment Site Shoulder    Right/Left Shoulder Right;Left    Right Shoulder Flexion 160 Degrees    Right Shoulder ABduction 173 Degrees    Right Shoulder Internal Rotation 60 Degrees    Right Shoulder External Rotation 77 Degrees    Left Shoulder Flexion 165 Degrees    Left Shoulder ABduction 165 Degrees    Left Shoulder Internal Rotation 57 Degrees    Left Shoulder External Rotation 77 Degrees             LYMPHEDEMA/ONCOLOGY QUESTIONNAIRE - 09/04/20 0001      Type   Cancer Type R breast cancer      Lymphedema Assessments   Lymphedema Assessments Upper extremities      Right Upper Extremity Lymphedema   15 cm Proximal to Olecranon  Process 29.5 cm    Olecranon Process 24.5 cm    15 cm Proximal to Ulnar Styloid Process 22.6 cm    Just Proximal to Ulnar Styloid Process 15.5 cm    Across Hand at [employer] 19.6 cm    At [place] of 2nd Digit 6.2 cm      Left Upper Extremity Lymphedema   15 cm Proximal to Olecranon Process 29.5 cm    Olecranon Process 24.5 cm    15 cm Proximal to Ulnar Styloid Process 21.7 cm    Just Proximal to Ulnar Styloid Process 15.5 cm    Across Hand at [employer] 19 cm    At [place] of 2nd Digit 6.4 cm           L-DEX FLOWSHEETS - 09/04/20 0800      L-DEX LYMPHEDEMA SCREENING   Measurement Type Unilateral    L-DEX MEASUREMENT EXTREMITY Upper Extremity    POSITION  Standing    DOMINANT SIDE Right    At Risk Side Right    BASELINE SCORE (UNILATERAL) -4.6            The patient was assessed using the L-Dex machine today to produce a lymphedema index baseline score. The patient will be reassessed on a regular basis (typically every 3 months) to obtain new L-Dex scores. If the score is > 6.5 points away from his/her baseline score indicating onset of subclinical lymphedema, it will be recommended to wear a compression garment for 4 weeks, 12 hours per day and then be reassessed. If the score continues to be > 6.5 points from baseline at reassessment, we will initiate lymphedema treatment. Assessing in this manner has a 95% rate of preventing clinically significant lymphedema.       Objective measurements completed on examination: See above findings.               PT Education - 09/04/20 [id number]    Education Details ABC class, post op shoulder ROM exercises, anatomy and physiology of lymphatic system, signs and symptoms of lymphedema, SOZO, lymphedema risk reduction practices    Person(s) Educated Patient    Methods Explanation;Handout    Comprehension Verbalized understanding               PT Long Term Goals - 09/04/20  1856      PT LONG TERM GOAL #1   Title Pt  will return to baseline shoudler ROM measurements and not demonstrate any signs of lymphedema.    Time 4    Period Months    Status New    Target Date 01/04/21           Breast Clinic Goals - 09/04/20 0851      Patient will be able to verbalize understanding of pertinent lymphedema risk reduction practices relevant to her diagnosis specifically related to skin care.   Time 1    Period Days    Status Achieved      Patient will be able to return demonstrate and/or verbalize understanding of the post-op home exercise program related to regaining shoulder range of motion.   Time 1    Period Days    Status Achieved      Patient will be able to verbalize understanding of the importance of attending the postoperative After Breast Cancer Class for further lymphedema risk reduction education and therapeutic exercise.   Time 1    Period Days    Status Achieved                 Plan - 09/04/20 0853    Clinical Impression Statement Pt was diagnosed with R breast cancer, grade 2-3 DCIS. It is HER2+, ER+, PR+. She is beginning neoadjuvant chemotherapy and will do this for 12 weeks prior to surgery and this will require radiation and antiestrogen therapy.  Pt was instructed in post op exercises and educated that if she has a mastectomy then not to begin exercises until a week after the last drain is removed. Baseline ROM and SOZO measurements taken today. She will benefit from a post op PT reassessment to determine needs and every 3 months for additional L dex screening (if lymph nodes are biopsied) to detect subclinical lymphedema.    Personal Factors and Comorbidities Comorbidity 1    Comorbidities diabetes    Stability/Clinical Decision Making Stable/Uncomplicated    Clinical Decision Making Low    PT Frequency --   eval and 1 f/u visit   PT Duration Other (comment)   4 months   PT Treatment/Interventions ADLs/Self Care Home Management;Therapeutic exercise;Patient/family education     PT Next Visit Plan reassess baselines, sign pt up for ABC class- sign pt up for SOZO every 3 months only if pt has lymph nodes removed during surgery    PT Home Exercise Plan post op breast exercises    Consulted and Agree with Plan of Care Patient           Patient will benefit from skilled therapeutic intervention in order to improve the following deficits and impairments:  Postural dysfunction,Decreased knowledge of precautions  Visit Diagnosis: Abnormal posture  Malignant neoplasm of upper-outer quadrant of right breast in female, estrogen receptor positive ([place])   Patient will follow up at outpatient cancer rehab 3-4 weeks following surgery.  If the patient requires physical therapy at that time, a specific plan will be dictated and sent to the referring physician for approval. The patient was educated today on appropriate basic range of motion exercises to begin post operatively and the importance of attending the After Breast Cancer class following surgery.  Patient was educated today on lymphedema risk reduction practices as it pertains to recommendations that will benefit the patient immediately following surgery.  She verbalized good understanding.     Problem List Patient Active Problem List  Diagnosis Date Noted  . Malignant neoplasm of upper-outer quadrant of right breast in female, estrogen receptor positive ([place]) 08/15/2020  . Personal history of COVID-19 07/04/2020  . Mixed hyperlipidemia 07/04/2020  . Diabetes mellitus type 2 in obese ([place]) 04/28/2019  . Precordial pain 04/28/2019  . DOE (dyspnea on exertion) 04/28/2019  . Tendonitis, Achilles, right 01/17/2018  . Heel pain 09/26/2015  . Plantar fasciitis of right foot 10/25/2014  . Equinus deformity of foot, acquired 10/25/2014  . Tarsal tunnel syndrome 10/25/2014  . Deformity of metatarsal bone of right foot 10/25/2014  . Genital herpes, unspecified 12/21/2012  . DIABETES MELLITUS II, UNCOMPLICATED 19/50/9326   . OBESITY, NOS 08/25/2006  . ANEMIA, ACUTE BLOOD LOSS 08/25/2006  . DEPRESSION, MAJOR, RECURRENT 08/25/2006  . HEADACHE, UNSPECIFIED 08/25/2006    Allyson Sabal [facility] 09/04/2020, 8:56 AM  [place], [place], [address] Phone: [phone]   Fax:  [phone]  Name: Wiley Flicker MRN: [id number] Date of Birth: 02-28-1956  Manus Gunning, PT 09/04/20 8:56 AM

## 2020-09-04 NOTE — Progress Notes (Signed)
Patient Care Team: Shon Baton, MD as PCP - General (Internal Medicine) Buford Dresser, MD as PCP - Cardiology (Cardiology) Mauro Kaufmann, RN as Oncology Nurse Navigator Rockwell Germany, RN as Oncology Nurse Navigator  DIAGNOSIS:    ICD-10-CM   1. Malignant neoplasm of upper-outer quadrant of right breast in female, estrogen receptor positive ([place])  C50.411    Z17.0     SUMMARY OF ONCOLOGIC HISTORY: Oncology History  Malignant neoplasm of upper-outer quadrant of right breast in female, estrogen receptor positive ([place])  08/08/2020 Initial Diagnosis   Screening detected right breast mass 2.2 cm by ultrasound upper outer quadrant biopsy: Grade 2-3 IDC with DCIS ER 60%, PR 20%, Ki-67 15%, HER-2 +3+   08/15/2020 Cancer Staging   Staging form: Breast, AJCC 8th Edition - Clinical stage from 08/15/2020: Stage IB (cT2, cN0, cM0, G3, ER+, PR+, HER2+) - Signed by Nicholas Lose, MD on 08/15/2020 Stage prefix: Initial diagnosis   08/27/2020 -  Chemotherapy    Patient is on Treatment Plan: BREAST PACLITAXEL + TRASTUZUMAB Q7D / TRASTUZUMAB Q21D        CHIEF COMPLIANT: Cycle 2 Taxol Herceptin  INTERVAL HISTORY: Rachel Gaines is a 65 y.o. with above-mentioned history of right breast cancer currently on neoadjuvant chemotherapy with Taxol Herceptin. She presents to the clinic today for cycle 2.  She had diarrhea for 1 day after chemo and mild fatigue. Denied nausea.  ALLERGIES:  is allergic to penicillins.  MEDICATIONS:  Current Outpatient Medications  Medication Sig Dispense Refill  . aspirin EC 81 MG tablet Take 81 mg by mouth daily.    . Biotin 5 MG CAPS Take 1 capsule by mouth daily.    . Continuous Blood Gluc Sensor (FREESTYLE LIBRE 2 SENSOR) MISC CHANGE EVERY 14 DAYS TO MONITOR BLOOD GLUCOSE CONTINUOSLY    . diclofenac Sodium (VOLTAREN) 1 % GEL SMARTSIG:2 Gram(s) Topical 1 to 4 Times Daily PRN    . empagliflozin (JARDIANCE) 10 MG TABS tablet Take 10 mg by mouth daily.    Marland Kitchen  FREESTYLE LITE test strip     . gabapentin (NEURONTIN) 300 MG capsule Take 300 mg by mouth at bedtime. Only takes at night    . HUMALOG KWIKPEN 100 UNIT/ML KwikPen 10 Units 3 (three) times daily.    Marland Kitchen LANTUS SOLOSTAR 100 UNIT/ML Solostar Pen Inject 34 Units into the skin 2 (two) times daily. If CBG <80 in AM, take only 20 units.   1  . lidocaine-prilocaine (EMLA) cream Apply 1 application topically as needed. 30 g 0  . NONFORMULARY OR COMPOUNDED ITEM [facility]:  Antiinflammatory cream - Diclofenac 3%, Baclofen 2%, Cyclobenzaprine 2%, Lidocaine 2%, dispense 120grams, apply 1-2 grams to affected area 3-4 times a day, +2refills. 120 each 2  . ondansetron (ZOFRAN) 8 MG tablet Take 1 tablet (8 mg total) by mouth every 8 (eight) hours as needed for nausea or vomiting. 30 tablet 0  . prochlorperazine (COMPAZINE) 10 MG tablet Take 1 tablet (10 mg total) by mouth every 6 (six) hours as needed for nausea or vomiting. 30 tablet 0  . ramipril (ALTACE) 2.5 MG capsule Take 2.5 mg by mouth every other day.     . rosuvastatin (CRESTOR) 40 MG tablet Take 1 tablet by mouth daily.    . sitaGLIPtin (JANUVIA) 100 MG tablet Take 100 mg by mouth daily.    . traMADol (ULTRAM) 50 MG tablet Take 1 tablet (50 mg total) by mouth every 6 (six) hours as needed. 10 tablet 0  .  UNIFINE PENTIPS 32G X 4 MM MISC USE WITH LEVEMIR AND HUMALOG 4X A DAY DX-E11.65    . valACYclovir (VALTREX) 500 MG tablet Take 1 tablet (500 mg total) by mouth 2 (two) times daily. Take for 3 days for outbreak 30 tablet 2  . VITAMIN D PO Take by mouth.     No current facility-administered medications for this visit.    PHYSICAL EXAMINATION: ECOG PERFORMANCE STATUS: 1 - Symptomatic but completely ambulatory  Vitals:   09/05/20 1200  BP: 121/64  Pulse: 77  Resp: 18  Temp: 98.1 F (36.7 C)  SpO2: 99%   Filed Weights   09/05/20 1200  Weight: 163 lb 12.8 oz (74.3 kg)    LABORATORY DATA:  I have reviewed the data as listed CMP Latest  Ref Rng & Units 09/05/2020 08/27/2020 08/22/2020  Glucose 70 - 99 mg/dL 100(H) 400(H) 125(H)  BUN 8 - 23 mg/dL 5(L) 12 7(L)  Creatinine 0.44 - 1.00 mg/dL 0.79 0.96 0.77  Sodium 135 - 145 mmol/L 142 139 142  Potassium 3.5 - 5.1 mmol/L 3.5 3.7 3.7  Chloride 98 - 111 mmol/L 108 106 106  CO2 22 - 32 mmol/L _0 Calcium 8.9 - 10.3 mg/dL 9.7 9.7 9.8  Total Protein 6.5 - 8.1 g/dL 7.4 7.7 -  Total Bilirubin 0.3 - 1.2 mg/dL 1.2 1.2 -  Alkaline Phos 38 - 126 U/L 107 99 -  AST 15 - 41 U/L 20 18 -  ALT 0 - 44 U/L 17 22 -    Lab Results  Component Value Date   WBC 6.0 09/05/2020   HGB 13.3 09/05/2020   HCT 39.5 09/05/2020   MCV 81.1 09/05/2020   PLT 273 09/05/2020   NEUTROABS 4.0 09/05/2020    ASSESSMENT & PLAN:  Malignant neoplasm of upper-outer quadrant of right breast in female, estrogen receptor positive ([place]) 08/08/2020:Screening detected right breast mass 2.2 cm by ultrasound upper outer quadrant biopsy: Grade 2-3 IDC with DCIS ER 60%, PR 20%, Ki-67 15%, HER-2 +3+ T2N0 stage Ia Treatment plan: 1.Neoadjuvant chemotherapy with Taxol and Herceptin weekly x12 followed by Herceptin maintenance 2.breast conserving surgery with some [place] biopsy 3.Adjuvant radiation 4.Adjuvant antiestrogen therapy  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 Diarrhea: she has imodium and will take it for diarrhea. 2. Mild fatigue  Elevated sugars: better today  Labs were great RTC weekly for Taxol Herceptin and every other week for follow-up with me.    No orders of the defined types were placed in this encounter.  The patient has a good understanding of the overall plan. she agrees with it. she will call with any problems that may develop before the next visit here.  Total  time spent: 30 mins including face to face time and time spent for planning, charting and coordination of care  Rulon Eisenmenger, MD, MPH 09/05/2020  I, Molly Dorshimer, am acting as scribe for Dr. Nicholas Lose.  I have reviewed the above documentation for accuracy and completeness, and I agree with the above.

## 2020-09-05 ENCOUNTER — Other Ambulatory Visit: Payer: Self-pay

## 2020-09-05 ENCOUNTER — Encounter: Payer: Self-pay | Admitting: *Deleted

## 2020-09-05 ENCOUNTER — Inpatient Hospital Stay: Payer: 59 | Admitting: Hematology and Oncology

## 2020-09-05 ENCOUNTER — Inpatient Hospital Stay: Payer: 59

## 2020-09-05 DIAGNOSIS — Z5112 Encounter for antineoplastic immunotherapy: Secondary | ICD-10-CM | POA: Diagnosis not present

## 2020-09-05 DIAGNOSIS — C50411 Malignant neoplasm of upper-outer quadrant of right female breast: Secondary | ICD-10-CM | POA: Diagnosis not present

## 2020-09-05 DIAGNOSIS — Z170 Estrogen receptor positive status [ER+]: Secondary | ICD-10-CM

## 2020-09-05 DIAGNOSIS — Z880 Allergy status to penicillin: Secondary | ICD-10-CM | POA: Diagnosis not present

## 2020-09-05 DIAGNOSIS — Z801 Family history of malignant neoplasm of trachea, bronchus and lung: Secondary | ICD-10-CM | POA: Diagnosis not present

## 2020-09-05 LAB — CMP (CANCER CENTER ONLY)
ALT: 17 U/L (ref 0–44)
AST: 20 U/L (ref 15–41)
Albumin: 3.8 g/dL (ref 3.5–5.0)
Alkaline Phosphatase: 107 U/L (ref 38–126)
Anion gap: 7 (ref 5–15)
BUN: 5 mg/dL — ABNORMAL LOW (ref 8–23)
CO2: 27 mmol/L (ref 22–32)
Calcium: 9.7 mg/dL (ref 8.9–10.3)
Chloride: 108 mmol/L (ref 98–111)
Creatinine: 0.79 mg/dL (ref 0.44–1.00)
GFR, Estimated: >60 mL/min (ref >60)
Glucose, Bld: 100 mg/dL — ABNORMAL HIGH (ref 70–99)
Potassium: 3.5 mmol/L (ref 3.5–5.1)
Sodium: 142 mmol/L (ref 135–145)
Total Bilirubin: 1.2 mg/dL (ref 0.3–1.2)
Total Protein: 7.4 g/dL (ref 6.5–8.1)

## 2020-09-05 LAB — CBC WITH DIFFERENTIAL (CANCER CENTER ONLY)
Abs Immature Granulocytes: 0.01 10*3/uL (ref 0.00–0.07)
Basophils Absolute: 0 10*3/uL (ref 0.0–0.1)
Basophils Relative: 0 %
Eosinophils Absolute: 0.1 10*3/uL (ref 0.0–0.5)
Eosinophils Relative: 1 %
HCT: 39.5 % (ref 36.0–46.0)
Hemoglobin: 13.3 g/dL (ref 12.0–15.0)
Immature Granulocytes: 0 %
Lymphocytes Relative: 27 %
Lymphs Abs: 1.6 10*3/uL (ref 0.7–4.0)
MCH: 27.3 pg (ref 26.0–34.0)
MCHC: 33.7 g/dL (ref 30.0–36.0)
MCV: 81.1 fL (ref 80.0–100.0)
Monocytes Absolute: 0.3 10*3/uL (ref 0.1–1.0)
Monocytes Relative: 5 %
Neutro Abs: 4 10*3/uL (ref 1.7–7.7)
Neutrophils Relative %: 67 %
Platelet Count: 273 10*3/uL (ref 150–400)
RBC: 4.87 MIL/uL (ref 3.87–5.11)
RDW: 13.9 % (ref 11.5–15.5)
WBC Count: 6 10*3/uL (ref 4.0–10.5)
nRBC: 0 % (ref 0.0–0.2)

## 2020-09-05 MED ORDER — FAMOTIDINE IN NACL 20-0.9 MG/50ML-% IV SOLN
INTRAVENOUS | Status: AC
  Filled 2020-09-05: qty 50

## 2020-09-05 MED ORDER — ACETAMINOPHEN 325 MG PO TABS
ORAL_TABLET | ORAL | Status: AC
  Filled 2020-09-05: qty 2

## 2020-09-05 MED ORDER — FAMOTIDINE IN NACL 20-0.9 MG/50ML-% IV SOLN
20.0000 mg | Freq: Once | INTRAVENOUS | Status: AC
  Administered 2020-09-05: 20 mg via INTRAVENOUS

## 2020-09-05 MED ORDER — DIPHENHYDRAMINE HCL 50 MG/ML IJ SOLN
INTRAMUSCULAR | Status: AC
  Filled 2020-09-05: qty 1

## 2020-09-05 MED ORDER — HEPARIN SOD (PORK) LOCK FLUSH 100 UNIT/ML IV SOLN
500.0000 [IU] | Freq: Once | INTRAVENOUS | Status: AC | PRN
  Administered 2020-09-05: 500 [IU]
  Filled 2020-09-05: qty 5

## 2020-09-05 MED ORDER — SODIUM CHLORIDE 0.9 % IV SOLN
Freq: Once | INTRAVENOUS | Status: AC
  Filled 2020-09-05: qty 250

## 2020-09-05 MED ORDER — SODIUM CHLORIDE 0.9% FLUSH
10.0000 mL | INTRAVENOUS | Status: DC | PRN
  Administered 2020-09-05: 10 mL
  Filled 2020-09-05: qty 10

## 2020-09-05 MED ORDER — DIPHENHYDRAMINE HCL 50 MG/ML IJ SOLN
25.0000 mg | Freq: Once | INTRAMUSCULAR | Status: AC
  Administered 2020-09-05: 25 mg via INTRAVENOUS

## 2020-09-05 MED ORDER — SODIUM CHLORIDE 0.9 % IV SOLN
80.0000 mg/m2 | Freq: Once | INTRAVENOUS | Status: AC
  Administered 2020-09-05: 150 mg via INTRAVENOUS
  Filled 2020-09-05: qty 25

## 2020-09-05 MED ORDER — ZENPEP 40000-126000 UNITS PO CPEP: 1.0000 | ORAL_CAPSULE | Freq: Two times a day (BID) | ORAL | Status: AC

## 2020-09-05 MED ORDER — ACETAMINOPHEN 325 MG PO TABS
650.0000 mg | ORAL_TABLET | Freq: Once | ORAL | Status: AC
  Administered 2020-09-05: 650 mg via ORAL

## 2020-09-05 MED ORDER — TRASTUZUMAB-ANNS CHEMO 150 MG IV SOLR
150.0000 mg | Freq: Once | INTRAVENOUS | Status: AC
  Administered 2020-09-05: 150 mg via INTRAVENOUS
  Filled 2020-09-05: qty 7.14

## 2020-09-05 MED ORDER — SODIUM CHLORIDE 0.9 % IV SOLN
10.0000 mg | Freq: Once | INTRAVENOUS | Status: AC
  Administered 2020-09-05: 10 mg via INTRAVENOUS
  Filled 2020-09-05: qty 10

## 2020-09-05 NOTE — Assessment & Plan Note (Addendum)
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

## 2020-09-05 NOTE — Patient Instructions (Signed)
[facility] Discharge Instructions for Patients Receiving Chemotherapy  Today you received the following chemotherapy agents Trastuzumab & Paclitaxel  To help prevent nausea and vomiting after your treatment, we encourage you to take your nausea medication as prescribed.   If you develop nausea and vomiting that is not controlled by your nausea medication, call the clinic.   BELOW ARE SYMPTOMS THAT SHOULD BE REPORTED IMMEDIATELY:  *FEVER GREATER THAN 100.5 F  *CHILLS WITH OR WITHOUT FEVER  NAUSEA AND VOMITING THAT IS NOT CONTROLLED WITH YOUR NAUSEA MEDICATION  *UNUSUAL SHORTNESS OF BREATH  *UNUSUAL BRUISING OR BLEEDING  TENDERNESS IN MOUTH AND THROAT WITH OR WITHOUT PRESENCE OF ULCERS  *URINARY PROBLEMS  *BOWEL PROBLEMS  UNUSUAL RASH Items with * indicate a potential emergency and should be followed up as soon as possible.  Feel free to call the clinic should you have any questions or concerns. The clinic phone number is [phone].  Please show the [facility] at check-in to the Emergency Department and triage nurse.  Trastuzumab injection for infusion What is this medicine? TRASTUZUMAB (tras TOO zoo mab) is a monoclonal antibody. It is used to treat breast cancer and stomach cancer. This medicine may be used for other purposes; ask your health care provider or pharmacist if you have questions. COMMON BRAND NAME(S): Herceptin, Galvin Proffer, Trazimera What should I tell my health care provider before I take this medicine? They need to know if you have any of these conditions:  heart disease  heart failure  lung or breathing disease, like asthma  an unusual or allergic reaction to trastuzumab, benzyl alcohol, or other medications, foods, dyes, or preservatives  pregnant or trying to get pregnant  breast-feeding How should I use this medicine? This drug is given as an infusion into a vein. It is administered in a  hospital or clinic by a specially trained health care professional. Talk to your pediatrician regarding the use of this medicine in children. This medicine is not approved for use in children. Overdosage: If you think you have taken too much of this medicine contact a poison control center or emergency room at once. NOTE: This medicine is only for you. Do not share this medicine with others. What if I miss a dose? It is important not to miss a dose. Call your doctor or health care professional if you are unable to keep an appointment. What may interact with this medicine? This medicine may interact with the following medications:  certain types of chemotherapy, such as daunorubicin, doxorubicin, epirubicin, and idarubicin This list may not describe all possible interactions. Give your health care provider a list of all the medicines, herbs, non-prescription drugs, or dietary supplements you use. Also tell them if you smoke, drink alcohol, or use illegal drugs. Some items may interact with your medicine. What should I watch for while using this medicine? Visit your doctor for checks on your progress. Report any side effects. Continue your course of treatment even though you feel ill unless your doctor tells you to stop. Call your doctor or health care professional for advice if you get a fever, chills or sore throat, or other symptoms of a cold or flu. Do not treat yourself. Try to avoid being around people who are sick. You may experience fever, chills and shaking during your first infusion. These effects are usually mild and can be treated with other medicines. Report any side effects during the infusion to your health care professional. Fever  and chills usually do not happen with later infusions. Do not become pregnant while taking this medicine or for 7 months after stopping it. Women should inform their doctor if they wish to become pregnant or think they might be pregnant. Women of child-bearing  potential will need to have a negative pregnancy test before starting this medicine. There is a potential for serious side effects to an unborn child. Talk to your health care professional or pharmacist for more information. Do not breast-feed an infant while taking this medicine or for 7 months after stopping it. Women must use effective birth control with this medicine. What side effects may I notice from receiving this medicine? Side effects that you should report to your doctor or health care professional as soon as possible:  allergic reactions like skin rash, itching or hives, swelling of the face, lips, or tongue  chest pain or palpitations  cough  dizziness  feeling faint or lightheaded, falls  fever  general ill feeling or flu-like symptoms  signs of worsening heart failure like breathing problems; swelling in your legs and feet  unusually weak or tired Side effects that usually do not require medical attention (report to your doctor or health care professional if they continue or are bothersome):  bone pain  changes in taste  diarrhea  joint pain  nausea/vomiting  weight loss This list may not describe all possible side effects. Call your doctor for medical advice about side effects. You may report side effects to FDA at 1-800-FDA-1088. Where should I keep my medicine? This drug is given in a hospital or clinic and will not be stored at home. NOTE: This sheet is a summary. It may not cover all possible information. If you have questions about this medicine, talk to your doctor, pharmacist, or health care provider.  2021 Elsevier/Gold Standard (2016-06-08 14:37:52)  Paclitaxel injection What is this medicine? PACLITAXEL (PAK li TAX el) is a chemotherapy drug. It targets fast dividing cells, like cancer cells, and causes these cells to die. This medicine is used to treat ovarian cancer, breast cancer, lung cancer, Kaposi's sarcoma, and other cancers. This medicine  may be used for other purposes; ask your health care provider or pharmacist if you have questions. COMMON BRAND NAME(S): Onxol, Taxol What should I tell my health care provider before I take this medicine? They need to know if you have any of these conditions:  history of irregular heartbeat  liver disease  low blood counts, like low white cell, platelet, or red cell counts  lung or breathing disease, like asthma  tingling of the fingers or toes, or other nerve disorder  an unusual or allergic reaction to paclitaxel, alcohol, polyoxyethylated castor oil, other chemotherapy, other medicines, foods, dyes, or preservatives  pregnant or trying to get pregnant  breast-feeding How should I use this medicine? This drug is given as an infusion into a vein. It is administered in a hospital or clinic by a specially trained health care professional. Talk to your pediatrician regarding the use of this medicine in children. Special care may be needed. Overdosage: If you think you have taken too much of this medicine contact a poison control center or emergency room at once. NOTE: This medicine is only for you. Do not share this medicine with others. What if I miss a dose? It is important not to miss your dose. Call your doctor or health care professional if you are unable to keep an appointment. What may interact with this medicine?  Do not take this medicine with any of the following medications:  live virus vaccines This medicine may also interact with the following medications:  antiviral medicines for hepatitis, HIV or AIDS  certain antibiotics like erythromycin and clarithromycin  certain medicines for fungal infections like ketoconazole and itraconazole  certain medicines for seizures like carbamazepine, phenobarbital, phenytoin  gemfibrozil  nefazodone  rifampin  St. John's wort This list may not describe all possible interactions. Give your health care provider a list of all  the medicines, herbs, non-prescription drugs, or dietary supplements you use. Also tell them if you smoke, drink alcohol, or use illegal drugs. Some items may interact with your medicine. What should I watch for while using this medicine? Your condition will be monitored carefully while you are receiving this medicine. You will need important blood work done while you are taking this medicine. This medicine can cause serious allergic reactions. To reduce your risk you will need to take other medicine(s) before treatment with this medicine. If you experience allergic reactions like skin rash, itching or hives, swelling of the face, lips, or tongue, tell your doctor or health care professional right away. In some cases, you may be given additional medicines to help with side effects. Follow all directions for their use. This drug may make you feel generally unwell. This is not uncommon, as chemotherapy can affect healthy cells as well as cancer cells. Report any side effects. Continue your course of treatment even though you feel ill unless your doctor tells you to stop. Call your doctor or health care professional for advice if you get a fever, chills or sore throat, or other symptoms of a cold or flu. Do not treat yourself. This drug decreases your body's ability to fight infections. Try to avoid being around people who are sick. This medicine may increase your risk to bruise or bleed. Call your doctor or health care professional if you notice any unusual bleeding. Be careful brushing and flossing your teeth or using a toothpick because you may get an infection or bleed more easily. If you have any dental work done, tell your dentist you are receiving this medicine. Avoid taking products that contain aspirin, acetaminophen, ibuprofen, naproxen, or ketoprofen unless instructed by your doctor. These medicines may hide a fever. Do not become pregnant while taking this medicine. Women should inform their  doctor if they wish to become pregnant or think they might be pregnant. There is a potential for serious side effects to an unborn child. Talk to your health care professional or pharmacist for more information. Do not breast-feed an infant while taking this medicine. Men are advised not to father a child while receiving this medicine. This product may contain alcohol. Ask your pharmacist or healthcare provider if this medicine contains alcohol. Be sure to tell all healthcare providers you are taking this medicine. Certain medicines, like metronidazole and disulfiram, can cause an unpleasant reaction when taken with alcohol. The reaction includes flushing, headache, nausea, vomiting, sweating, and increased thirst. The reaction can last from 30 minutes to several hours. What side effects may I notice from receiving this medicine? Side effects that you should report to your doctor or health care professional as soon as possible:  allergic reactions like skin rash, itching or hives, swelling of the face, lips, or tongue  breathing problems  changes in vision  fast, irregular heartbeat  high or low blood pressure  mouth sores  pain, tingling, numbness in the hands or feet  signs  of decreased platelets or bleeding - bruising, pinpoint red spots on the skin, black, tarry stools, blood in the urine  signs of decreased red blood cells - unusually weak or tired, feeling faint or lightheaded, falls  signs of infection - fever or chills, cough, sore throat, pain or difficulty passing urine  signs and symptoms of liver injury like dark yellow or brown urine; general ill feeling or flu-like symptoms; light-colored stools; loss of appetite; nausea; right upper belly pain; unusually weak or tired; yellowing of the eyes or skin  swelling of the ankles, feet, hands  unusually slow heartbeat Side effects that usually do not require medical attention (report to your doctor or health care professional if  they continue or are bothersome):  diarrhea  hair loss  loss of appetite  muscle or joint pain  nausea, vomiting  pain, redness, or irritation at site where injected  tiredness This list may not describe all possible side effects. Call your doctor for medical advice about side effects. You may report side effects to FDA at 1-800-FDA-1088. Where should I keep my medicine? This drug is given in a hospital or clinic and will not be stored at home. NOTE: This sheet is a summary. It may not cover all possible information. If you have questions about this medicine, talk to your doctor, pharmacist, or health care provider.  2021 Elsevier/Gold Standard (2019-05-16 13:37:23)

## 2020-09-08 ENCOUNTER — Telehealth: Payer: Self-pay | Admitting: Hematology and Oncology

## 2020-09-08 NOTE — Telephone Encounter (Signed)
Checked out appointment. No LOS notes needing to be scheduled. No changes made. 

## 2020-09-10 ENCOUNTER — Other Ambulatory Visit: Payer: 59

## 2020-09-10 ENCOUNTER — Ambulatory Visit: Payer: 59 | Admitting: Hematology and Oncology

## 2020-09-10 ENCOUNTER — Ambulatory Visit: Payer: 59

## 2020-09-11 DIAGNOSIS — E11319 Type 2 diabetes mellitus with unspecified diabetic retinopathy without macular edema: Secondary | ICD-10-CM | POA: Diagnosis not present

## 2020-09-11 DIAGNOSIS — E785 Hyperlipidemia, unspecified: Secondary | ICD-10-CM | POA: Diagnosis not present

## 2020-09-11 DIAGNOSIS — Z794 Long term (current) use of insulin: Secondary | ICD-10-CM | POA: Diagnosis not present

## 2020-09-11 NOTE — Progress Notes (Signed)
Patient Care Team: Shon Baton, MD as PCP - General (Internal Medicine) Buford Dresser, MD as PCP - Cardiology (Cardiology) Mauro Kaufmann, RN as Oncology Nurse Navigator Rockwell Germany, RN as Oncology Nurse Navigator  DIAGNOSIS:    ICD-10-CM   1. Malignant neoplasm of upper-outer quadrant of right breast in female, estrogen receptor positive ([place])  C50.411    Z17.0     SUMMARY OF ONCOLOGIC HISTORY: Oncology History  Malignant neoplasm of upper-outer quadrant of right breast in female, estrogen receptor positive ([place])  08/08/2020 Initial Diagnosis   Screening detected right breast mass 2.2 cm by ultrasound upper outer quadrant biopsy: Grade 2-3 IDC with DCIS ER 60%, PR 20%, Ki-67 15%, HER-2 +3+   08/15/2020 Cancer Staging   Staging form: Breast, AJCC 8th Edition - Clinical stage from 08/15/2020: Stage IB (cT2, cN0, cM0, G3, ER+, PR+, HER2+) - Signed by Nicholas Lose, MD on 08/15/2020 Stage prefix: Initial diagnosis   08/27/2020 -  Chemotherapy    Patient is on Treatment Plan: BREAST PACLITAXEL + TRASTUZUMAB Q7D / TRASTUZUMAB Q21D        CHIEF COMPLIANT: Cycle 3Taxol Herceptin  INTERVAL HISTORY: Mckenzi Buonomo is a 65 y.o. with above-mentioned history of right breast cancer currently on neoadjuvant chemotherapy withTaxol Herceptin.She presents to the clinic today for cycle 3.  Her major complaint is diarrhea which is intermittent.  When she takes Imodium it gets better.  She has not been taking Imodium regularly.  Denies any other side effects.  Denies any nausea or vomiting.  Her blood sugars are running low and that is being her latest issue.  Her primary care physician is working on the dosing of her insulin.  ALLERGIES:  is allergic to penicillins.  MEDICATIONS:  Current Outpatient Medications  Medication Sig Dispense Refill  . aspirin EC 81 MG tablet Take 81 mg by mouth daily.    . Biotin 5 MG CAPS Take 1 capsule by mouth daily.    . Continuous Blood Gluc  Sensor (FREESTYLE LIBRE 2 SENSOR) MISC CHANGE EVERY 14 DAYS TO MONITOR BLOOD GLUCOSE CONTINUOSLY    . diclofenac Sodium (VOLTAREN) 1 % GEL SMARTSIG:2 Gram(s) Topical 1 to 4 Times Daily PRN    . empagliflozin (JARDIANCE) 10 MG TABS tablet Take 10 mg by mouth daily.    Marland Kitchen FREESTYLE LITE test strip     . gabapentin (NEURONTIN) 300 MG capsule Take 300 mg by mouth at bedtime. Only takes at night    . HUMALOG KWIKPEN 100 UNIT/ML KwikPen 10 Units 3 (three) times daily.    Marland Kitchen LANTUS SOLOSTAR 100 UNIT/ML Solostar Pen Inject 34 Units into the skin 2 (two) times daily. If CBG <80 in AM, take only 20 units.   1  . lidocaine-prilocaine (EMLA) cream Apply 1 application topically as needed. 30 g 0  . NONFORMULARY OR COMPOUNDED ITEM [facility]:  Antiinflammatory cream - Diclofenac 3%, Baclofen 2%, Cyclobenzaprine 2%, Lidocaine 2%, dispense 120grams, apply 1-2 grams to affected area 3-4 times a day, +2refills. 120 each 2  . ondansetron (ZOFRAN) 8 MG tablet Take 1 tablet (8 mg total) by mouth every 8 (eight) hours as needed for nausea or vomiting. 30 tablet 0  . Pancrelipase, Lip-Prot-Amyl, (ZENPEP) 40000-126000 units CPEP Take 1 capsule by mouth in the morning and at bedtime. 120 capsule   . prochlorperazine (COMPAZINE) 10 MG tablet Take 1 tablet (10 mg total) by mouth every 6 (six) hours as needed for nausea or vomiting. 30 tablet 0  . ramipril (  ALTACE) 2.5 MG capsule Take 2.5 mg by mouth every other day.     . rosuvastatin (CRESTOR) 40 MG tablet Take 1 tablet by mouth daily.    . sitaGLIPtin (JANUVIA) 100 MG tablet Take 100 mg by mouth daily.    . traMADol (ULTRAM) 50 MG tablet Take 1 tablet (50 mg total) by mouth every 6 (six) hours as needed. 10 tablet 0  . UNIFINE PENTIPS 32G X 4 MM MISC USE WITH LEVEMIR AND HUMALOG 4X A DAY [id number]    . valACYclovir (VALTREX) 500 MG tablet Take 1 tablet (500 mg total) by mouth 2 (two) times daily. Take for 3 days for outbreak 30 tablet 2  . VITAMIN D PO Take by mouth.      No current facility-administered medications for this visit.    PHYSICAL EXAMINATION: ECOG PERFORMANCE STATUS: 1 - Symptomatic but completely ambulatory  Vitals:   09/12/20 1147  BP: (!) 126/58  Pulse: 75  Resp: 15  Temp: (!) 97.5 F (36.4 C)  SpO2: 97%   Filed Weights   09/12/20 1147  Weight: 164 lb 6.4 oz (74.6 kg)    LABORATORY DATA:  I have reviewed the data as listed CMP Latest Ref Rng & Units 09/12/2020 09/05/2020 08/27/2020  Glucose 70 - 99 mg/dL 54(L) 100(H) 400(H)  BUN 8 - 23 mg/dL 6(L) 5(L) 12  Creatinine 0.44 - 1.00 mg/dL 0.77 0.79 0.96  Sodium 135 - 145 mmol/L 143 142 139  Potassium 3.5 - 5.1 mmol/L 3.8 3.5 3.7  Chloride 98 - 111 mmol/L 109 108 106  CO2 22 - 32 mmol/L _0 Calcium 8.9 - 10.3 mg/dL 9.5 9.7 9.7  Total Protein 6.5 - 8.1 g/dL 7.2 7.4 7.7  Total Bilirubin 0.3 - 1.2 mg/dL 1.3(H) 1.2 1.2  Alkaline Phos 38 - 126 U/L 91 107 99  AST 15 - 41 U/L _1 ALT 0 - 44 U/L _2 Lab Results  Component Value Date   WBC 5.3 09/12/2020   HGB 13.4 09/12/2020   HCT 40.7 09/12/2020   MCV 82.9 09/12/2020   PLT 322 09/12/2020   NEUTROABS 1.9 09/12/2020    ASSESSMENT & PLAN:  Malignant neoplasm of upper-outer quadrant of right breast in female, estrogen receptor positive ([place]) 08/08/2020:Screening detected right breast mass 2.2 cm by ultrasound upper outer quadrant biopsy: Grade 2-3 IDC with DCIS ER 60%, PR 20%, Ki-67 15%, HER-2 +3+ T2N0 stage Ia Treatment plan: 1.Neoadjuvant chemotherapy with Taxol and Herceptin weekly x12 followed by Herceptin maintenance 2.breast conserving surgery with some [place] biopsy 3.Adjuvant radiation 4.Adjuvant antiestrogen therapy  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 Diarrhea: she  has imodium and will take it for diarrhea. 2. Mild fatigue  Elevated sugars: better today  Labs were great RTC weekly for Taxol Herceptin and every other week for follow-up with me.    No orders of the defined types were placed in this encounter.  The patient has a good understanding of the overall plan. she agrees with it. she will call with any problems that may develop before the next visit here.  Total time spent: 30 mins including face to face time and time spent for planning, charting and coordination of care  Rulon Eisenmenger, MD, MPH 09/12/2020  I, Molly Dorshimer, am acting as scribe for Dr. Nicholas Lose.  I have reviewed the above documentation for  accuracy and completeness, and I agree with the above.       

## 2020-09-12 ENCOUNTER — Encounter: Payer: Self-pay | Admitting: Hematology and Oncology

## 2020-09-12 ENCOUNTER — Inpatient Hospital Stay (HOSPITAL_BASED_OUTPATIENT_CLINIC_OR_DEPARTMENT_OTHER): Payer: 59 | Admitting: Hematology and Oncology

## 2020-09-12 ENCOUNTER — Other Ambulatory Visit: Payer: Self-pay

## 2020-09-12 ENCOUNTER — Inpatient Hospital Stay: Payer: 59

## 2020-09-12 DIAGNOSIS — Z5112 Encounter for antineoplastic immunotherapy: Secondary | ICD-10-CM | POA: Diagnosis not present

## 2020-09-12 DIAGNOSIS — Z95828 Presence of other vascular implants and grafts: Secondary | ICD-10-CM

## 2020-09-12 DIAGNOSIS — Z170 Estrogen receptor positive status [ER+]: Secondary | ICD-10-CM

## 2020-09-12 DIAGNOSIS — C50411 Malignant neoplasm of upper-outer quadrant of right female breast: Secondary | ICD-10-CM

## 2020-09-12 DIAGNOSIS — Z801 Family history of malignant neoplasm of trachea, bronchus and lung: Secondary | ICD-10-CM | POA: Diagnosis not present

## 2020-09-12 DIAGNOSIS — Z880 Allergy status to penicillin: Secondary | ICD-10-CM | POA: Diagnosis not present

## 2020-09-12 LAB — CBC WITH DIFFERENTIAL (CANCER CENTER ONLY)
Abs Immature Granulocytes: 0.02 10*3/uL (ref 0.00–0.07)
Basophils Absolute: 0 10*3/uL (ref 0.0–0.1)
Basophils Relative: 1 %
Eosinophils Absolute: 0.1 10*3/uL (ref 0.0–0.5)
Eosinophils Relative: 2 %
HCT: 40.7 % (ref 36.0–46.0)
Hemoglobin: 13.4 g/dL (ref 12.0–15.0)
Immature Granulocytes: 0 %
Lymphocytes Relative: 55 %
Lymphs Abs: 2.9 10*3/uL (ref 0.7–4.0)
MCH: 27.3 pg (ref 26.0–34.0)
MCHC: 32.9 g/dL (ref 30.0–36.0)
MCV: 82.9 fL (ref 80.0–100.0)
Monocytes Absolute: 0.3 10*3/uL (ref 0.1–1.0)
Monocytes Relative: 6 %
Neutro Abs: 1.9 10*3/uL (ref 1.7–7.7)
Neutrophils Relative %: 36 %
Platelet Count: 322 10*3/uL (ref 150–400)
RBC: 4.91 MIL/uL (ref 3.87–5.11)
RDW: 14.2 % (ref 11.5–15.5)
WBC Count: 5.3 10*3/uL (ref 4.0–10.5)
nRBC: 0 % (ref 0.0–0.2)

## 2020-09-12 LAB — CMP (CANCER CENTER ONLY)
ALT: 19 U/L (ref 0–44)
AST: 18 U/L (ref 15–41)
Albumin: 3.8 g/dL (ref 3.5–5.0)
Alkaline Phosphatase: 91 U/L (ref 38–126)
Anion gap: 6 (ref 5–15)
BUN: 6 mg/dL — ABNORMAL LOW (ref 8–23)
CO2: 28 mmol/L (ref 22–32)
Calcium: 9.5 mg/dL (ref 8.9–10.3)
Chloride: 109 mmol/L (ref 98–111)
Creatinine: 0.77 mg/dL (ref 0.44–1.00)
GFR, Estimated: >60 mL/min (ref >60)
Glucose, Bld: 54 mg/dL — ABNORMAL LOW (ref 70–99)
Potassium: 3.8 mmol/L (ref 3.5–5.1)
Sodium: 143 mmol/L (ref 135–145)
Total Bilirubin: 1.3 mg/dL — ABNORMAL HIGH (ref 0.3–1.2)
Total Protein: 7.2 g/dL (ref 6.5–8.1)

## 2020-09-12 MED ORDER — FAMOTIDINE IN NACL 20-0.9 MG/50ML-% IV SOLN
INTRAVENOUS | Status: AC
  Filled 2020-09-12: qty 50

## 2020-09-12 MED ORDER — ACETAMINOPHEN 325 MG PO TABS
ORAL_TABLET | ORAL | Status: AC
  Filled 2020-09-12: qty 2

## 2020-09-12 MED ORDER — TRASTUZUMAB-ANNS CHEMO 150 MG IV SOLR
150.0000 mg | Freq: Once | INTRAVENOUS | Status: AC
  Administered 2020-09-12: 150 mg via INTRAVENOUS
  Filled 2020-09-12: qty 7.14

## 2020-09-12 MED ORDER — SODIUM CHLORIDE 0.9 % IV SOLN
Freq: Once | INTRAVENOUS | Status: AC
  Filled 2020-09-12: qty 250

## 2020-09-12 MED ORDER — SODIUM CHLORIDE 0.9 % IV SOLN
10.0000 mg | Freq: Once | INTRAVENOUS | Status: AC
  Administered 2020-09-12: 10 mg via INTRAVENOUS
  Filled 2020-09-12: qty 10

## 2020-09-12 MED ORDER — FAMOTIDINE IN NACL 20-0.9 MG/50ML-% IV SOLN
20.0000 mg | Freq: Once | INTRAVENOUS | Status: AC
  Administered 2020-09-12: 20 mg via INTRAVENOUS

## 2020-09-12 MED ORDER — SODIUM CHLORIDE 0.9% FLUSH
10.0000 mL | INTRAVENOUS | Status: DC | PRN
  Administered 2020-09-12: 10 mL via INTRAVENOUS
  Filled 2020-09-12: qty 10

## 2020-09-12 MED ORDER — ACETAMINOPHEN 325 MG PO TABS
650.0000 mg | ORAL_TABLET | Freq: Once | ORAL | Status: AC
  Administered 2020-09-12: 650 mg via ORAL

## 2020-09-12 MED ORDER — SODIUM CHLORIDE 0.9% FLUSH
10.0000 mL | INTRAVENOUS | Status: DC | PRN
  Administered 2020-09-12: 10 mL
  Filled 2020-09-12: qty 10

## 2020-09-12 MED ORDER — HEPARIN SOD (PORK) LOCK FLUSH 100 UNIT/ML IV SOLN
500.0000 [IU] | Freq: Once | INTRAVENOUS | Status: AC | PRN
  Administered 2020-09-12: 500 [IU]
  Filled 2020-09-12: qty 5

## 2020-09-12 MED ORDER — SODIUM CHLORIDE 0.9 % IV SOLN
80.0000 mg/m2 | Freq: Once | INTRAVENOUS | Status: AC
  Administered 2020-09-12: 150 mg via INTRAVENOUS
  Filled 2020-09-12: qty 25

## 2020-09-12 MED ORDER — DIPHENHYDRAMINE HCL 50 MG/ML IJ SOLN
INTRAMUSCULAR | Status: AC
  Filled 2020-09-12: qty 1

## 2020-09-12 MED ORDER — DIPHENHYDRAMINE HCL 50 MG/ML IJ SOLN
25.0000 mg | Freq: Once | INTRAMUSCULAR | Status: AC
Start: 2020-09-12 — End: 2020-09-12
  Administered 2020-09-12: 25 mg via INTRAVENOUS

## 2020-09-12 NOTE — Assessment & Plan Note (Signed)
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 Diarrhea: she has imodium and will take it for diarrhea. 2. Mild fatigue  Elevated sugars: better today  Labs were great RTC weekly for Taxol Herceptin and every other week for follow-up with me.

## 2020-09-12 NOTE — Patient Instructions (Signed)
Implanted Port Insertion, Care After This sheet gives you information about how to care for yourself after your procedure. Your health care provider may also give you more specific instructions. If you have problems or questions, contact your health care provider. What can I expect after the procedure? After the procedure, it is common to have:  Discomfort at the port insertion site.  Bruising on the skin over the port. This should improve over 3-4 days. Follow these instructions at home: Port care  After your port is placed, you will get a manufacturer's information card. The card has information about your port. Keep this card with you at all times.  Take care of the port as told by your health care provider. Ask your health care provider if you or a family member can get training for taking care of the port at home. A home health care nurse may also take care of the port.  Make sure to remember what type of port you have. Incision care  Follow instructions from your health care provider about how to take care of your port insertion site. Make sure you: ? Wash your hands with soap and water before and after you change your bandage (dressing). If soap and water are not available, use hand sanitizer. ? Change your dressing as told by your health care provider. ? Leave stitches (sutures), skin glue, or adhesive strips in place. These skin closures may need to stay in place for 2 weeks or longer. If adhesive strip edges start to loosen and curl up, you may trim the loose edges. Do not remove adhesive strips completely unless your health care provider tells you to do that.  Check your port insertion site every day for signs of infection. Check for: ? Redness, swelling, or pain. ? Fluid or blood. ? Warmth. ? Pus or a bad smell.      Activity  Return to your normal activities as told by your health care provider. Ask your health care provider what activities are safe for you.  Do not  lift anything that is heavier than 10 lb (4.5 kg), or the limit that you are told, until your health care provider says that it is safe. General instructions  Take over-the-counter and prescription medicines only as told by your health care provider.  Do not take baths, swim, or use a hot tub until your health care provider approves. Ask your health care provider if you may take showers. You may only be allowed to take sponge baths.  Do not drive for 24 hours if you were given a sedative during your procedure.  Wear a medical alert bracelet in case of an emergency. This will tell any health care providers that you have a port.  Keep all follow-up visits as told by your health care provider. This is important. Contact a health care provider if:  You cannot flush your port with saline as directed, or you cannot draw blood from the port.  You have a fever or chills.  You have redness, swelling, or pain around your port insertion site.  You have fluid or blood coming from your port insertion site.  Your port insertion site feels warm to the touch.  You have pus or a bad smell coming from the port insertion site. Get help right away if:  You have chest pain or shortness of breath.  You have bleeding from your port that you cannot control. Summary  Take care of the port as told by your   health care provider. Keep the manufacturer's information card with you at all times.  Change your dressing as told by your health care provider.  Contact a health care provider if you have a fever or chills or if you have redness, swelling, or pain around your port insertion site.  Keep all follow-up visits as told by your health care provider. This information is not intended to replace advice given to you by your health care provider. Make sure you discuss any questions you have with your health care provider. Document Revised: 01/10/2018 Document Reviewed: 01/10/2018 Elsevier Patient Education   2021 Elsevier Inc.  

## 2020-09-12 NOTE — Patient Instructions (Signed)
[facility] Discharge Instructions for Patients Receiving Chemotherapy  Today you received the following chemotherapy agents Trastuzumab & Paclitaxel  To help prevent nausea and vomiting after your treatment, we encourage you to take your nausea medication as prescribed.   If you develop nausea and vomiting that is not controlled by your nausea medication, call the clinic.   BELOW ARE SYMPTOMS THAT SHOULD BE REPORTED IMMEDIATELY:  *FEVER GREATER THAN 100.5 F  *CHILLS WITH OR WITHOUT FEVER  NAUSEA AND VOMITING THAT IS NOT CONTROLLED WITH YOUR NAUSEA MEDICATION  *UNUSUAL SHORTNESS OF BREATH  *UNUSUAL BRUISING OR BLEEDING  TENDERNESS IN MOUTH AND THROAT WITH OR WITHOUT PRESENCE OF ULCERS  *URINARY PROBLEMS  *BOWEL PROBLEMS  UNUSUAL RASH Items with * indicate a potential emergency and should be followed up as soon as possible.  Feel free to call the clinic should you have any questions or concerns. The clinic phone number is [phone].  Please show the [facility] at check-in to the Emergency Department and triage nurse.  Trastuzumab injection for infusion What is this medicine? TRASTUZUMAB (tras TOO zoo mab) is a monoclonal antibody. It is used to treat breast cancer and stomach cancer. This medicine may be used for other purposes; ask your health care provider or pharmacist if you have questions. COMMON BRAND NAME(S): Herceptin, Galvin Proffer, Trazimera What should I tell my health care provider before I take this medicine? They need to know if you have any of these conditions:  heart disease  heart failure  lung or breathing disease, like asthma  an unusual or allergic reaction to trastuzumab, benzyl alcohol, or other medications, foods, dyes, or preservatives  pregnant or trying to get pregnant  breast-feeding How should I use this medicine? This drug is given as an infusion into a vein. It is administered in a  hospital or clinic by a specially trained health care professional. Talk to your pediatrician regarding the use of this medicine in children. This medicine is not approved for use in children. Overdosage: If you think you have taken too much of this medicine contact a poison control center or emergency room at once. NOTE: This medicine is only for you. Do not share this medicine with others. What if I miss a dose? It is important not to miss a dose. Call your doctor or health care professional if you are unable to keep an appointment. What may interact with this medicine? This medicine may interact with the following medications:  certain types of chemotherapy, such as daunorubicin, doxorubicin, epirubicin, and idarubicin This list may not describe all possible interactions. Give your health care provider a list of all the medicines, herbs, non-prescription drugs, or dietary supplements you use. Also tell them if you smoke, drink alcohol, or use illegal drugs. Some items may interact with your medicine. What should I watch for while using this medicine? Visit your doctor for checks on your progress. Report any side effects. Continue your course of treatment even though you feel ill unless your doctor tells you to stop. Call your doctor or health care professional for advice if you get a fever, chills or sore throat, or other symptoms of a cold or flu. Do not treat yourself. Try to avoid being around people who are sick. You may experience fever, chills and shaking during your first infusion. These effects are usually mild and can be treated with other medicines. Report any side effects during the infusion to your health care professional. Fever  and chills usually do not happen with later infusions. Do not become pregnant while taking this medicine or for 7 months after stopping it. Women should inform their doctor if they wish to become pregnant or think they might be pregnant. Women of child-bearing  potential will need to have a negative pregnancy test before starting this medicine. There is a potential for serious side effects to an unborn child. Talk to your health care professional or pharmacist for more information. Do not breast-feed an infant while taking this medicine or for 7 months after stopping it. Women must use effective birth control with this medicine. What side effects may I notice from receiving this medicine? Side effects that you should report to your doctor or health care professional as soon as possible:  allergic reactions like skin rash, itching or hives, swelling of the face, lips, or tongue  chest pain or palpitations  cough  dizziness  feeling faint or lightheaded, falls  fever  general ill feeling or flu-like symptoms  signs of worsening heart failure like breathing problems; swelling in your legs and feet  unusually weak or tired Side effects that usually do not require medical attention (report to your doctor or health care professional if they continue or are bothersome):  bone pain  changes in taste  diarrhea  joint pain  nausea/vomiting  weight loss This list may not describe all possible side effects. Call your doctor for medical advice about side effects. You may report side effects to FDA at 1-800-FDA-1088. Where should I keep my medicine? This drug is given in a hospital or clinic and will not be stored at home. NOTE: This sheet is a summary. It may not cover all possible information. If you have questions about this medicine, talk to your doctor, pharmacist, or health care provider.  2021 Elsevier/Gold Standard (2016-06-08 14:37:52)  Paclitaxel injection What is this medicine? PACLITAXEL (PAK li TAX el) is a chemotherapy drug. It targets fast dividing cells, like cancer cells, and causes these cells to die. This medicine is used to treat ovarian cancer, breast cancer, lung cancer, Kaposi's sarcoma, and other cancers. This medicine  may be used for other purposes; ask your health care provider or pharmacist if you have questions. COMMON BRAND NAME(S): Onxol, Taxol What should I tell my health care provider before I take this medicine? They need to know if you have any of these conditions:  history of irregular heartbeat  liver disease  low blood counts, like low white cell, platelet, or red cell counts  lung or breathing disease, like asthma  tingling of the fingers or toes, or other nerve disorder  an unusual or allergic reaction to paclitaxel, alcohol, polyoxyethylated castor oil, other chemotherapy, other medicines, foods, dyes, or preservatives  pregnant or trying to get pregnant  breast-feeding How should I use this medicine? This drug is given as an infusion into a vein. It is administered in a hospital or clinic by a specially trained health care professional. Talk to your pediatrician regarding the use of this medicine in children. Special care may be needed. Overdosage: If you think you have taken too much of this medicine contact a poison control center or emergency room at once. NOTE: This medicine is only for you. Do not share this medicine with others. What if I miss a dose? It is important not to miss your dose. Call your doctor or health care professional if you are unable to keep an appointment. What may interact with this medicine?  Do not take this medicine with any of the following medications:  live virus vaccines This medicine may also interact with the following medications:  antiviral medicines for hepatitis, HIV or AIDS  certain antibiotics like erythromycin and clarithromycin  certain medicines for fungal infections like ketoconazole and itraconazole  certain medicines for seizures like carbamazepine, phenobarbital, phenytoin  gemfibrozil  nefazodone  rifampin  St. John's wort This list may not describe all possible interactions. Give your health care provider a list of all  the medicines, herbs, non-prescription drugs, or dietary supplements you use. Also tell them if you smoke, drink alcohol, or use illegal drugs. Some items may interact with your medicine. What should I watch for while using this medicine? Your condition will be monitored carefully while you are receiving this medicine. You will need important blood work done while you are taking this medicine. This medicine can cause serious allergic reactions. To reduce your risk you will need to take other medicine(s) before treatment with this medicine. If you experience allergic reactions like skin rash, itching or hives, swelling of the face, lips, or tongue, tell your doctor or health care professional right away. In some cases, you may be given additional medicines to help with side effects. Follow all directions for their use. This drug may make you feel generally unwell. This is not uncommon, as chemotherapy can affect healthy cells as well as cancer cells. Report any side effects. Continue your course of treatment even though you feel ill unless your doctor tells you to stop. Call your doctor or health care professional for advice if you get a fever, chills or sore throat, or other symptoms of a cold or flu. Do not treat yourself. This drug decreases your body's ability to fight infections. Try to avoid being around people who are sick. This medicine may increase your risk to bruise or bleed. Call your doctor or health care professional if you notice any unusual bleeding. Be careful brushing and flossing your teeth or using a toothpick because you may get an infection or bleed more easily. If you have any dental work done, tell your dentist you are receiving this medicine. Avoid taking products that contain aspirin, acetaminophen, ibuprofen, naproxen, or ketoprofen unless instructed by your doctor. These medicines may hide a fever. Do not become pregnant while taking this medicine. Women should inform their  doctor if they wish to become pregnant or think they might be pregnant. There is a potential for serious side effects to an unborn child. Talk to your health care professional or pharmacist for more information. Do not breast-feed an infant while taking this medicine. Men are advised not to father a child while receiving this medicine. This product may contain alcohol. Ask your pharmacist or healthcare provider if this medicine contains alcohol. Be sure to tell all healthcare providers you are taking this medicine. Certain medicines, like metronidazole and disulfiram, can cause an unpleasant reaction when taken with alcohol. The reaction includes flushing, headache, nausea, vomiting, sweating, and increased thirst. The reaction can last from 30 minutes to several hours. What side effects may I notice from receiving this medicine? Side effects that you should report to your doctor or health care professional as soon as possible:  allergic reactions like skin rash, itching or hives, swelling of the face, lips, or tongue  breathing problems  changes in vision  fast, irregular heartbeat  high or low blood pressure  mouth sores  pain, tingling, numbness in the hands or feet  signs  of decreased platelets or bleeding - bruising, pinpoint red spots on the skin, black, tarry stools, blood in the urine  signs of decreased red blood cells - unusually weak or tired, feeling faint or lightheaded, falls  signs of infection - fever or chills, cough, sore throat, pain or difficulty passing urine  signs and symptoms of liver injury like dark yellow or brown urine; general ill feeling or flu-like symptoms; light-colored stools; loss of appetite; nausea; right upper belly pain; unusually weak or tired; yellowing of the eyes or skin  swelling of the ankles, feet, hands  unusually slow heartbeat Side effects that usually do not require medical attention (report to your doctor or health care professional if  they continue or are bothersome):  diarrhea  hair loss  loss of appetite  muscle or joint pain  nausea, vomiting  pain, redness, or irritation at site where injected  tiredness This list may not describe all possible side effects. Call your doctor for medical advice about side effects. You may report side effects to FDA at 1-800-FDA-1088. Where should I keep my medicine? This drug is given in a hospital or clinic and will not be stored at home. NOTE: This sheet is a summary. It may not cover all possible information. If you have questions about this medicine, talk to your doctor, pharmacist, or health care provider.  2021 Elsevier/Gold Standard (2019-05-16 13:37:23)

## 2020-09-12 NOTE — Progress Notes (Signed)
Gave my card to registration with information regarding grant on back.  Patient can contact me if interested in applying or for any additional financial questions or concerns which was communicated to patient.

## 2020-09-15 ENCOUNTER — Telehealth: Payer: Self-pay | Admitting: Hematology and Oncology

## 2020-09-15 NOTE — Telephone Encounter (Signed)
Scheduled per 3/18 los. Pt will receive an updated appt calendar per next visit appt notes

## 2020-09-17 ENCOUNTER — Other Ambulatory Visit: Payer: 59

## 2020-09-17 ENCOUNTER — Ambulatory Visit: Payer: 59 | Admitting: Hematology and Oncology

## 2020-09-17 ENCOUNTER — Ambulatory Visit: Payer: 59

## 2020-09-18 ENCOUNTER — Other Ambulatory Visit: Payer: Self-pay | Admitting: Hematology and Oncology

## 2020-09-18 ENCOUNTER — Telehealth: Payer: Self-pay | Admitting: *Deleted

## 2020-09-18 MED ORDER — DIPHENOXYLATE-ATROPINE 2.5-0.025 MG PO TABS: 1.0000 | ORAL_TABLET | Freq: Four times a day (QID) | ORAL | 0 refills | Status: DC | PRN

## 2020-09-18 MED FILL — DIPHENOXYLATE-ATROPINE 2.5-: 2.5-0.025 | 8 days supply | Qty: 30 | Fill #0

## 2020-09-18 NOTE — Telephone Encounter (Signed)
Received call from pt with complaint of diarrhea x3 days. Unrelieved by OTC imodium x4 doses yesterday and 2 doses today.  Pt denies s/s of dehydration.  RN will review with MD for further recommendations.

## 2020-09-19 ENCOUNTER — Ambulatory Visit: Payer: 59 | Admitting: Physician Assistant

## 2020-09-19 ENCOUNTER — Inpatient Hospital Stay: Payer: 59

## 2020-09-19 ENCOUNTER — Other Ambulatory Visit: Payer: Self-pay

## 2020-09-19 VITALS — BP 99/50 | HR 92 | Temp 97.9°F | Resp 18 | Wt 162.0 lb

## 2020-09-19 DIAGNOSIS — Z801 Family history of malignant neoplasm of trachea, bronchus and lung: Secondary | ICD-10-CM | POA: Diagnosis not present

## 2020-09-19 DIAGNOSIS — Z880 Allergy status to penicillin: Secondary | ICD-10-CM | POA: Diagnosis not present

## 2020-09-19 DIAGNOSIS — Z95828 Presence of other vascular implants and grafts: Secondary | ICD-10-CM

## 2020-09-19 DIAGNOSIS — C50411 Malignant neoplasm of upper-outer quadrant of right female breast: Secondary | ICD-10-CM | POA: Diagnosis not present

## 2020-09-19 DIAGNOSIS — Z170 Estrogen receptor positive status [ER+]: Secondary | ICD-10-CM | POA: Diagnosis not present

## 2020-09-19 DIAGNOSIS — Z5112 Encounter for antineoplastic immunotherapy: Secondary | ICD-10-CM | POA: Diagnosis not present

## 2020-09-19 LAB — CBC WITH DIFFERENTIAL (CANCER CENTER ONLY)
Abs Immature Granulocytes: 0.03 10*3/uL (ref 0.00–0.07)
Basophils Absolute: 0 10*3/uL (ref 0.0–0.1)
Basophils Relative: 0 %
Eosinophils Absolute: 0.1 10*3/uL (ref 0.0–0.5)
Eosinophils Relative: 2 %
HCT: 44.5 % (ref 36.0–46.0)
Hemoglobin: 14.8 g/dL (ref 12.0–15.0)
Immature Granulocytes: 0 %
Lymphocytes Relative: 22 %
Lymphs Abs: 1.5 10*3/uL (ref 0.7–4.0)
MCH: 27.7 pg (ref 26.0–34.0)
MCHC: 33.3 g/dL (ref 30.0–36.0)
MCV: 83.2 fL (ref 80.0–100.0)
Monocytes Absolute: 0.5 10*3/uL (ref 0.1–1.0)
Monocytes Relative: 7 %
Neutro Abs: 4.9 10*3/uL (ref 1.7–7.7)
Neutrophils Relative %: 69 %
Platelet Count: 292 10*3/uL (ref 150–400)
RBC: 5.35 MIL/uL — ABNORMAL HIGH (ref 3.87–5.11)
RDW: 14.4 % (ref 11.5–15.5)
WBC Count: 7 10*3/uL (ref 4.0–10.5)
nRBC: 0 % (ref 0.0–0.2)

## 2020-09-19 LAB — CMP (CANCER CENTER ONLY)
ALT: 21 U/L (ref 0–44)
AST: 18 U/L (ref 15–41)
Albumin: 4.1 g/dL (ref 3.5–5.0)
Alkaline Phosphatase: 90 U/L (ref 38–126)
Anion gap: 12 (ref 5–15)
BUN: 7 mg/dL — ABNORMAL LOW (ref 8–23)
CO2: 24 mmol/L (ref 22–32)
Calcium: 9.7 mg/dL (ref 8.9–10.3)
Chloride: 107 mmol/L (ref 98–111)
Creatinine: 0.82 mg/dL (ref 0.44–1.00)
GFR, Estimated: >60 mL/min (ref >60)
Glucose, Bld: 110 mg/dL — ABNORMAL HIGH (ref 70–99)
Potassium: 3.5 mmol/L (ref 3.5–5.1)
Sodium: 143 mmol/L (ref 135–145)
Total Bilirubin: 1.6 mg/dL — ABNORMAL HIGH (ref 0.3–1.2)
Total Protein: 7.7 g/dL (ref 6.5–8.1)

## 2020-09-19 MED ORDER — SODIUM CHLORIDE 0.9 % IV SOLN
10.0000 mg | Freq: Once | INTRAVENOUS | Status: AC
  Administered 2020-09-19: 10 mg via INTRAVENOUS
  Filled 2020-09-19: qty 10

## 2020-09-19 MED ORDER — ALTEPLASE 2 MG IJ SOLR
2.0000 mg | Freq: Once | INTRAMUSCULAR | Status: AC | PRN
  Administered 2020-09-19: 2 mg
  Filled 2020-09-19: qty 2

## 2020-09-19 MED ORDER — ACETAMINOPHEN 325 MG PO TABS
650.0000 mg | ORAL_TABLET | Freq: Once | ORAL | Status: AC
Start: 2020-09-19 — End: 2020-09-19
  Administered 2020-09-19: 650 mg via ORAL

## 2020-09-19 MED ORDER — HEPARIN SOD (PORK) LOCK FLUSH 100 UNIT/ML IV SOLN
500.0000 [IU] | Freq: Once | INTRAVENOUS | Status: AC | PRN
  Administered 2020-09-19: 500 [IU]
  Filled 2020-09-19: qty 5

## 2020-09-19 MED ORDER — SODIUM CHLORIDE 0.9 % IV SOLN
Freq: Once | INTRAVENOUS | Status: AC
  Filled 2020-09-19: qty 250

## 2020-09-19 MED ORDER — SODIUM CHLORIDE 0.9% FLUSH
10.0000 mL | INTRAVENOUS | Status: DC | PRN
  Administered 2020-09-19: 10 mL
  Filled 2020-09-19: qty 10

## 2020-09-19 MED ORDER — FAMOTIDINE IN NACL 20-0.9 MG/50ML-% IV SOLN
INTRAVENOUS | Status: AC
  Filled 2020-09-19: qty 50

## 2020-09-19 MED ORDER — FAMOTIDINE IN NACL 20-0.9 MG/50ML-% IV SOLN
20.0000 mg | Freq: Once | INTRAVENOUS | Status: AC
Start: 2020-09-19 — End: 2020-09-19
  Administered 2020-09-19: 20 mg via INTRAVENOUS

## 2020-09-19 MED ORDER — ALTEPLASE 2 MG IJ SOLR
INTRAMUSCULAR | Status: AC
  Filled 2020-09-19: qty 2

## 2020-09-19 MED ORDER — TRASTUZUMAB-ANNS CHEMO 150 MG IV SOLR
150.0000 mg | Freq: Once | INTRAVENOUS | Status: AC
  Administered 2020-09-19: 150 mg via INTRAVENOUS
  Filled 2020-09-19: qty 7.14

## 2020-09-19 MED ORDER — SODIUM CHLORIDE 0.9% FLUSH
10.0000 mL | INTRAVENOUS | Status: DC | PRN
  Administered 2020-09-19: 10 mL via INTRAVENOUS
  Filled 2020-09-19: qty 10

## 2020-09-19 MED ORDER — ACETAMINOPHEN 325 MG PO TABS
ORAL_TABLET | ORAL | Status: AC
  Filled 2020-09-19: qty 2

## 2020-09-19 MED ORDER — SODIUM CHLORIDE 0.9 % IV SOLN
80.0000 mg/m2 | Freq: Once | INTRAVENOUS | Status: AC
  Administered 2020-09-19: 150 mg via INTRAVENOUS
  Filled 2020-09-19: qty 25

## 2020-09-19 MED ORDER — DIPHENHYDRAMINE HCL 50 MG/ML IJ SOLN
25.0000 mg | Freq: Once | INTRAMUSCULAR | Status: AC
  Administered 2020-09-19: 25 mg via INTRAVENOUS

## 2020-09-19 MED ORDER — DIPHENHYDRAMINE HCL 50 MG/ML IJ SOLN
INTRAMUSCULAR | Status: AC
  Filled 2020-09-19: qty 1

## 2020-09-19 NOTE — Patient Instructions (Signed)

## 2020-09-19 NOTE — Patient Instructions (Signed)
[facility] Discharge Instructions for Patients Receiving Chemotherapy  Today you received the following immunotherapy agent: Traztusumab and chemotherapy agent: Paclitaxel (Taxol)  To help prevent nausea and vomiting after your treatment, we encourage you to take your nausea medication as directed by your MD.   If you develop nausea and vomiting that is not controlled by your nausea medication, call the clinic.   BELOW ARE SYMPTOMS THAT SHOULD BE REPORTED IMMEDIATELY:  *FEVER GREATER THAN 100.5 F  *CHILLS WITH OR WITHOUT FEVER  NAUSEA AND VOMITING THAT IS NOT CONTROLLED WITH YOUR NAUSEA MEDICATION  *UNUSUAL SHORTNESS OF BREATH  *UNUSUAL BRUISING OR BLEEDING  TENDERNESS IN MOUTH AND THROAT WITH OR WITHOUT PRESENCE OF ULCERS  *URINARY PROBLEMS  *BOWEL PROBLEMS  UNUSUAL RASH Items with * indicate a potential emergency and should be followed up as soon as possible.  Feel free to call the clinic should you have any questions or concerns. The clinic phone number is [phone].  Please show the [facility] at check-in to the Emergency Department and triage nurse.

## 2020-09-19 NOTE — Progress Notes (Signed)
Pt. BP 99/50, denies chest pain, dizziness, and no SOB noted. Pt. has had a 2 lb weight loss since last visit. Pt. states she is having diarrhea 4-6 stools per day. Received the new medication, Atropine yesterday and is taking it. Dr. Lindi Adie notified and ok to proceed with treatment and Normal Saline 1000 ml ordered and given in infusion.

## 2020-09-24 ENCOUNTER — Other Ambulatory Visit: Payer: 59

## 2020-09-24 ENCOUNTER — Telehealth: Payer: Self-pay

## 2020-09-24 ENCOUNTER — Ambulatory Visit: Payer: 59 | Admitting: Hematology and Oncology

## 2020-09-24 ENCOUNTER — Ambulatory Visit: Payer: 59

## 2020-09-24 NOTE — Telephone Encounter (Signed)
Nutrition  Patient called and left message for return call from RD.    RD called patient and no answer or option to leave voicemail as mailbox is full.  Will attempt another call.  Imajean Mcdermid B. Zenia Resides, [place], [place] Registered Dietitian [phone] (mobile)

## 2020-09-24 NOTE — Telephone Encounter (Signed)
Nutrition  Called patient again and no answer or option to leave voicemail due to mailbox being full.  Dowell Hoon B. Zenia Resides, [place], [place] Registered Dietitian [phone] (mobile)

## 2020-09-24 NOTE — Progress Notes (Addendum)
[facility] OFFICE PROGRESS NOTE  Shon Baton, MD [place] [address]  DIAGNOSIS: Stage IB (cT2, cN0, cM0, G3, ER+, PR+, HER2+) Right sided breast cancer  Oncology History  Malignant neoplasm of upper-outer quadrant of right breast in female, estrogen receptor positive ([place])  08/08/2020 Initial Diagnosis   Screening detected right breast mass 2.2 cm by ultrasound upper outer quadrant biopsy: Grade 2-3 IDC with DCIS ER 60%, PR 20%, Ki-67 15%, HER-2 +3+   08/15/2020 Cancer Staging   Staging form: Breast, AJCC 8th Edition - Clinical stage from 08/15/2020: Stage IB (cT2, cN0, cM0, G3, ER+, PR+, HER2+) - Signed by Nicholas Lose, MD on 08/15/2020 Stage prefix: Initial diagnosis   08/27/2020 -  Chemotherapy    Patient is on Treatment Plan: BREAST PACLITAXEL + TRASTUZUMAB Q7D / TRASTUZUMAB Q21D         CURRENT THERAPY: Cycle5Taxol Herceptin  INTERVAL HISTORY: Rachel Gaines 65 y.o. female returns to the clinic today for a follow-up visit.  The patient is feeling fine today without any concerning complaints. She has chronic diarrhea. She had an episode of diarrhea last week Friday and Saturday for which she used lomotil. Denies blood in the stool, fevers, or chills. She would like a refill in case she runs out of lomotil. She also has been losing weight. The nutritionist has been trying to call her but her voicemail is full. I let the patient know this and she will clear her voicemail. She has diabetes. She has not tried any of the diabetic supplemental protein drinks at this time.   The patient is currently undergoing weekly treatment with Taxol and Herceptin and she is tolerating her treatment fairly well except for intermittent diarrhea. Today she denies any fever, chills, or night sweats.  She denies any nausea, diarrhea, or constipation. She had one episode of vomiting last Friday but it resolved with her anti-emetic. That is the only time she had vomiting since  starting treatment. She denies any shortness of breath, cough, hemoptysis, or chest pain. She noticed an episode of tingling in her fingers since the interval of her last infusion. She does not do cryotherapy. No numbness of tingling at this time. She mentioned to my RN that she has just noticed a mild sore throat yesterday. Denies nasal congestion, facial pain, cough, post nasal drainage, allergies, or sick contacts. When asked to elaborate, she said she notes the there is some mild soreness on the right side of her neck near her port side such as when she is laying on her right side which is what she attributes her mild discomfort to. No erythema, swelling, or warmth. The patient is here today for evaluation and repeat lab work before starting week 5 of Taxol and Herceptin.    MEDICAL HISTORY: Past Medical History:  Diagnosis Date  . Abnormal Pap smear of cervix 2014   ASCUS with negative HR HPV   . Anemia   . Cancer [facility]) 06/2020   right breast IDC with DCIS  . Cataract   . Diabetes mellitus without complication ([facility])   . Hyperlipidemia    on medicine  . Plantar fasciitis   . 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  MEDICATIONS:  Current Outpatient Medications  Medication Sig Dispense Refill  . aspirin EC 81 MG tablet Take 81 mg by mouth daily.    . Biotin 5 MG CAPS Take 1 capsule by mouth  daily.    . Continuous Blood Gluc Sensor (FREESTYLE LIBRE 2 SENSOR) MISC CHANGE EVERY 14 DAYS TO MONITOR BLOOD GLUCOSE CONTINUOSLY    . diclofenac Sodium (VOLTAREN) 1 % GEL SMARTSIG:2 Gram(s) Topical 1 to 4 Times Daily PRN    . diphenoxylate-atropine (LOMOTIL) 2.5-0.025 MG tablet Take 1 tablet by mouth 4 (four) times daily as needed for diarrhea or loose stools. 30 tablet 0  . empagliflozin (JARDIANCE) 10 MG TABS tablet Take 10 mg by mouth daily.    Marland Kitchen FREESTYLE LITE test strip     . gabapentin (NEURONTIN) 300 MG capsule Take 300 mg by mouth at  bedtime. Only takes at night    . HUMALOG KWIKPEN 100 UNIT/ML KwikPen 10 Units 3 (three) times daily.    Marland Kitchen LANTUS SOLOSTAR 100 UNIT/ML Solostar Pen Inject 34 Units into the skin 2 (two) times daily. If CBG <80 in AM, take only 20 units.   1  . lidocaine-prilocaine (EMLA) cream Apply 1 application topically as needed. 30 g 0  . NONFORMULARY OR COMPOUNDED ITEM [facility]:  Antiinflammatory cream - Diclofenac 3%, Baclofen 2%, Cyclobenzaprine 2%, Lidocaine 2%, dispense 120grams, apply 1-2 grams to affected area 3-4 times a day, +2refills. 120 each 2  . ondansetron (ZOFRAN) 8 MG tablet Take 1 tablet (8 mg total) by mouth every 8 (eight) hours as needed for nausea or vomiting. 30 tablet 0  . Pancrelipase, Lip-Prot-Amyl, (ZENPEP) 40000-126000 units CPEP Take 1 capsule by mouth in the morning and at bedtime. 120 capsule   . potassium chloride SA (KLOR-CON) 20 MEQ tablet Take 1 tablet (20 mEq total) by mouth daily. 30 tablet 0  . prochlorperazine (COMPAZINE) 10 MG tablet Take 1 tablet (10 mg total) by mouth every 6 (six) hours as needed for nausea or vomiting. 30 tablet 0  . ramipril (ALTACE) 2.5 MG capsule Take 2.5 mg by mouth every other day.     . rosuvastatin (CRESTOR) 40 MG tablet Take 1 tablet by mouth daily.    . sitaGLIPtin (JANUVIA) 100 MG tablet Take 100 mg by mouth daily.    . traMADol (ULTRAM) 50 MG tablet Take 1 tablet (50 mg total) by mouth every 6 (six) hours as needed. 10 tablet 0  . UNIFINE PENTIPS 32G X 4 MM MISC USE WITH LEVEMIR AND HUMALOG 4X A DAY [id number]    . valACYclovir (VALTREX) 500 MG tablet Take 1 tablet (500 mg total) by mouth 2 (two) times daily. Take for 3 days for outbreak 30 tablet 2  . VITAMIN D PO Take by mouth.     No current facility-administered medications for this visit.   Facility-Administered Medications Ordered in Other Visits  Medication Dose Route Frequency Provider Last Rate Last Admin  . diphenhydrAMINE (BENADRYL) injection 25 mg  25 mg Intravenous  Once Nicholas Lose, MD      . famotidine (PEPCID) IVPB 20 mg premix  20 mg Intravenous Once Nicholas Lose, MD      . heparin lock flush 100 unit/mL  500 Units Intracatheter Once PRN Nicholas Lose, MD      . PACLitaxel (TAXOL) 150 mg in sodium chloride 0.9 % 250 mL chemo infusion (</= 44m/m2)  80 mg/m2 (Treatment Plan Recorded) Intravenous Once GNicholas Lose MD      . potassium chloride 10 mEq in 100 mL IVPB  10 mEq Intravenous Q1 Hr x 2 Gudena, Vinay, MD      . sodium chloride flush (NS) 0.9 % injection 10 mL  10 mL Intracatheter PRN  Nicholas Lose, MD      . Theotis [facility]) 150 mg in sodium chloride 0.9 % 250 mL chemo infusion  150 mg Intravenous Once Nicholas Lose, MD        SURGICAL HISTORY:  Past Surgical History:  Procedure Laterality Date  . CATARACT EXTRACTION  2019  . COLONOSCOPY    . COLPOSCOPY W/ BIOPSY / CURETTAGE  02/04/1999   Chronic cervicitis  . EYE SURGERY    . PORTACATH PLACEMENT N/A 08/26/2020   Procedure: INSERTION PORT-A-CATH;  Surgeon: Rolm Bookbinder, MD;  Location: [facility];  Service: General;  Laterality: N/A;  START TIME OF 12:30 PM FOR 60 MINUTES ROOM 8    REVIEW OF SYSTEMS:   Review of Systems  Constitutional: Positive for decreased appetite and weight loss. Negative for chills, fatigue, and fever.  HENT: Positive for mild tenderness along left neck. Negative for mouth sores, nosebleeds, and trouble swallowing.   Eyes: Negative for eye problems and icterus.  Respiratory: Negative for cough, hemoptysis, shortness of breath and wheezing.   Cardiovascular: Negative for chest pain and leg swelling.  Gastrointestinal: Positive for chronic diarrhea. Negative for abdominal pain, constipation, nausea and vomiting (resolved).  Genitourinary: Negative for bladder incontinence, difficulty urinating, dysuria, frequency and hematuria.   Musculoskeletal: Negative for back pain, gait problem, neck pain and neck stiffness.  Skin: Negative for  itching and rash.  Neurological: Negative for dizziness, extremity weakness, gait problem, headaches, light-headedness and seizures.  Hematological: Negative for adenopathy. Does not bruise/bleed easily.  Psychiatric/Behavioral: Negative for confusion, depression and sleep disturbance. The patient is not nervous/anxious.     PHYSICAL EXAMINATION:  Blood pressure 124/66, pulse 75, temperature 97.9 F (36.6 C), temperature source Tympanic, resp. rate 15, height _0  (1.626 m), weight 159 lb 4.8 oz (72.3 kg), last menstrual period 06/28/2006, SpO2 98 %.  ECOG PERFORMANCE STATUS: 1 - Symptomatic but completely ambulatory  Physical Exam  Constitutional: Oriented to person, place, and time and well-developed, well-nourished, and in no distress.   HENT:  Head: Normocephalic and atraumatic.  Mouth/Throat: Oropharynx is clear and moist. No oropharyngeal exudate. No erythema. No oral lesions. No thrush. Mild tenderness along muscles in left neck.  Eyes: Conjunctivae are normal. Right eye exhibits no discharge. Left eye exhibits no discharge. No scleral icterus.  Neck: Normal range of motion. Neck supple.  Cardiovascular: Normal rate, regular rhythm, normal heart sounds and intact distal pulses.   Pulmonary/Chest: Effort normal and breath sounds normal. No respiratory distress. No wheezes. No rales.  Abdominal: Soft. Bowel sounds are normal. Exhibits no distension and no mass. There is no tenderness.  Musculoskeletal: Normal range of motion. Exhibits no edema.  Lymphadenopathy:    No cervical adenopathy.  Neurological: Alert and oriented to person, place, and time. Exhibits normal muscle tone. Gait normal. Coordination normal.  Skin: Skin is warm and dry. No rash noted. Not diaphoretic. No erythema. No pallor.  Psychiatric: Mood, memory and judgment normal.  Vitals reviewed.  LABORATORY DATA: Lab Results  Component Value Date   WBC 5.4 09/26/2020   HGB 12.8 09/26/2020   HCT 37.8 09/26/2020    MCV 81.3 09/26/2020   PLT 273 09/26/2020      Chemistry      Component Value Date/Time   NA 143 09/26/2020 1008   NA 142 06/18/2019 0956   K 2.7 (LL) 09/26/2020 1008   CL 103 09/26/2020 1008   CO2 27 09/26/2020 1008   BUN 5 (L) 09/26/2020 1008   BUN 10  06/18/2019 0956   CREATININE 0.73 09/26/2020 1008      Component Value Date/Time   CALCIUM 9.1 09/26/2020 1008   ALKPHOS 82 09/26/2020 1008   AST 19 09/26/2020 1008   ALT 24 09/26/2020 1008   BILITOT 1.8 (H) 09/26/2020 1008       RADIOGRAPHIC STUDIES:  No results found.   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 Diarrhea: she has imodium and will take it for diarrhea. I refilled her lomotil to [place] if needed for her diarrhea.  2. Mild fatigue 3. Hypokalemia-likely secondary to diarrhea. Patient is to receive 20 meq IV of potassium today. A prescription for 20 meq daily was also sent to her house. Also given handout of potassium rich food.  4. Decreased appetite/weight loss-Will follow up with nutrition. Encouraged diabetic supplemental protein drinks.   Labs were reviewed with the patient which are adequate for treatment. Her bilirubin in isolation is a little elevated. Will continue to monitor on weekly labs. Regarding her weight loss, I instructed the patient to clear her voicemail so she can receive  messages from the clinic. She will return the nutritionist team member's phone call. She also was encouraged to make protein drinks or purchase the diabetic supplemental protein drinks. The patient mentioned in passing to my RN today that she noticed a mild left sided sore throat starting yesterday. She attributed this soreness to her port-a-cath due to sometimes having soreness with laying on that side. May be musculoskeletal as she had some mild tenderness with palpation over musculature during exam today. The patient does not have any associated symptoms. Exam unremarkable. Non-toxic or ill appearing. No fevers, facial pain, pressure, fatigue, nasal congestion, nasal drainage, cough, or shortness of breath. No sick contacts. No leukocytosis. I do not feel that her treatment needs to be delayed or that she needs to be covid tested at this time; however, she knows to seek medical evaluation if her symptoms worsen. I do not feel she needs antibiotics at this time which will exacerbate her diarrhea. She also was instructed to monitor for neuropathy closely. She will proceed with cycle #5 today as scheduled.  We will see her for a follow-up visit every other week with infusion.     No orders of the defined types were placed in this encounter.    I spent 20-29 minutes in this encounter.   Florentino Laabs L Nelani Schmelzle, PA-C 09/26/20

## 2020-09-25 ENCOUNTER — Encounter: Payer: Self-pay | Admitting: *Deleted

## 2020-09-26 ENCOUNTER — Other Ambulatory Visit: Payer: Self-pay

## 2020-09-26 ENCOUNTER — Inpatient Hospital Stay: Payer: 59

## 2020-09-26 ENCOUNTER — Inpatient Hospital Stay: Payer: 59 | Attending: Hematology and Oncology

## 2020-09-26 ENCOUNTER — Other Ambulatory Visit: Payer: Self-pay | Admitting: *Deleted

## 2020-09-26 ENCOUNTER — Inpatient Hospital Stay (HOSPITAL_BASED_OUTPATIENT_CLINIC_OR_DEPARTMENT_OTHER): Payer: 59 | Admitting: Physician Assistant

## 2020-09-26 VITALS — BP 124/66 | HR 75 | Temp 97.9°F | Resp 15 | Ht 64.0 in | Wt 159.3 lb

## 2020-09-26 DIAGNOSIS — Z170 Estrogen receptor positive status [ER+]: Secondary | ICD-10-CM

## 2020-09-26 DIAGNOSIS — E876 Hypokalemia: Secondary | ICD-10-CM | POA: Diagnosis not present

## 2020-09-26 DIAGNOSIS — C50411 Malignant neoplasm of upper-outer quadrant of right female breast: Secondary | ICD-10-CM

## 2020-09-26 DIAGNOSIS — Z801 Family history of malignant neoplasm of trachea, bronchus and lung: Secondary | ICD-10-CM | POA: Diagnosis not present

## 2020-09-26 DIAGNOSIS — R197 Diarrhea, unspecified: Secondary | ICD-10-CM | POA: Diagnosis not present

## 2020-09-26 DIAGNOSIS — Z5112 Encounter for antineoplastic immunotherapy: Secondary | ICD-10-CM | POA: Diagnosis not present

## 2020-09-26 DIAGNOSIS — Z880 Allergy status to penicillin: Secondary | ICD-10-CM | POA: Insufficient documentation

## 2020-09-26 DIAGNOSIS — Z95828 Presence of other vascular implants and grafts: Secondary | ICD-10-CM

## 2020-09-26 LAB — CMP (CANCER CENTER ONLY)
ALT: 24 U/L (ref 0–44)
AST: 19 U/L (ref 15–41)
Albumin: 3.5 g/dL (ref 3.5–5.0)
Alkaline Phosphatase: 82 U/L (ref 38–126)
Anion gap: 13 (ref 5–15)
BUN: 5 mg/dL — ABNORMAL LOW (ref 8–23)
CO2: 27 mmol/L (ref 22–32)
Calcium: 9.1 mg/dL (ref 8.9–10.3)
Chloride: 103 mmol/L (ref 98–111)
Creatinine: 0.73 mg/dL (ref 0.44–1.00)
GFR, Estimated: >60 mL/min
Glucose, Bld: 135 mg/dL — ABNORMAL HIGH (ref 70–99)
Potassium: 2.7 mmol/L — CL (ref 3.5–5.1)
Sodium: 143 mmol/L (ref 135–145)
Total Bilirubin: 1.8 mg/dL — ABNORMAL HIGH (ref 0.3–1.2)
Total Protein: 6.7 g/dL (ref 6.5–8.1)

## 2020-09-26 LAB — CBC WITH DIFFERENTIAL (CANCER CENTER ONLY)
Abs Immature Granulocytes: 0.01 K/uL (ref 0.00–0.07)
Basophils Absolute: 0 K/uL (ref 0.0–0.1)
Basophils Relative: 1 %
Eosinophils Absolute: 0.2 K/uL (ref 0.0–0.5)
Eosinophils Relative: 3 %
HCT: 37.8 % (ref 36.0–46.0)
Hemoglobin: 12.8 g/dL (ref 12.0–15.0)
Immature Granulocytes: 0 %
Lymphocytes Relative: 37 %
Lymphs Abs: 2 K/uL (ref 0.7–4.0)
MCH: 27.5 pg (ref 26.0–34.0)
MCHC: 33.9 g/dL (ref 30.0–36.0)
MCV: 81.3 fL (ref 80.0–100.0)
Monocytes Absolute: 0.4 K/uL (ref 0.1–1.0)
Monocytes Relative: 7 %
Neutro Abs: 2.8 K/uL (ref 1.7–7.7)
Neutrophils Relative %: 52 %
Platelet Count: 273 K/uL (ref 150–400)
RBC: 4.65 MIL/uL (ref 3.87–5.11)
RDW: 14.3 % (ref 11.5–15.5)
WBC Count: 5.4 K/uL (ref 4.0–10.5)
nRBC: 0 % (ref 0.0–0.2)

## 2020-09-26 MED ORDER — DIPHENHYDRAMINE HCL 50 MG/ML IJ SOLN
25.0000 mg | Freq: Once | INTRAMUSCULAR | Status: AC
  Administered 2020-09-26: 25 mg via INTRAVENOUS

## 2020-09-26 MED ORDER — HEPARIN SOD (PORK) LOCK FLUSH 100 UNIT/ML IV SOLN
500.0000 [IU] | Freq: Once | INTRAVENOUS | Status: AC | PRN
  Administered 2020-09-26: 500 [IU]
  Filled 2020-09-26: qty 5

## 2020-09-26 MED ORDER — FAMOTIDINE IN NACL 20-0.9 MG/50ML-% IV SOLN
20.0000 mg | Freq: Once | INTRAVENOUS | Status: AC
  Administered 2020-09-26: 20 mg via INTRAVENOUS

## 2020-09-26 MED ORDER — POTASSIUM CHLORIDE CRYS ER 20 MEQ PO TBCR
20.0000 meq | EXTENDED_RELEASE_TABLET | Freq: Every day | ORAL | 0 refills | Status: DC
Start: 2020-09-26 — End: 2020-09-26

## 2020-09-26 MED ORDER — ACETAMINOPHEN 325 MG PO TABS
650.0000 mg | ORAL_TABLET | Freq: Once | ORAL | Status: AC
  Administered 2020-09-26: 650 mg via ORAL

## 2020-09-26 MED ORDER — DIPHENHYDRAMINE HCL 25 MG PO CAPS
ORAL_CAPSULE | ORAL | Status: AC
  Filled 2020-09-26: qty 1

## 2020-09-26 MED ORDER — POTASSIUM CHLORIDE 10 MEQ/100ML IV SOLN
10.0000 meq | INTRAVENOUS | Status: AC
  Administered 2020-09-26 (×2): 10 meq via INTRAVENOUS

## 2020-09-26 MED ORDER — TRASTUZUMAB-ANNS CHEMO 150 MG IV SOLR
150.0000 mg | Freq: Once | INTRAVENOUS | Status: AC
Start: 2020-09-26 — End: 2020-09-26
  Administered 2020-09-26: 150 mg via INTRAVENOUS
  Filled 2020-09-26: qty 7.14

## 2020-09-26 MED ORDER — DIPHENHYDRAMINE HCL 50 MG/ML IJ SOLN
INTRAMUSCULAR | Status: AC
  Filled 2020-09-26: qty 1

## 2020-09-26 MED ORDER — SODIUM CHLORIDE 0.9% FLUSH
10.0000 mL | INTRAVENOUS | Status: DC | PRN
  Administered 2020-09-26: 10 mL
  Filled 2020-09-26: qty 10

## 2020-09-26 MED ORDER — SODIUM CHLORIDE 0.9 % IV SOLN
80.0000 mg/m2 | Freq: Once | INTRAVENOUS | Status: AC
  Administered 2020-09-26: 150 mg via INTRAVENOUS
  Filled 2020-09-26: qty 25

## 2020-09-26 MED ORDER — SODIUM CHLORIDE 0.9% FLUSH
10.0000 mL | Freq: Once | INTRAVENOUS | Status: AC
  Administered 2020-09-26: 10 mL
  Filled 2020-09-26: qty 10

## 2020-09-26 MED ORDER — ACETAMINOPHEN 325 MG PO TABS
ORAL_TABLET | ORAL | Status: AC
  Filled 2020-09-26: qty 2

## 2020-09-26 MED ORDER — SODIUM CHLORIDE 0.9 % IV SOLN
Freq: Once | INTRAVENOUS | Status: AC
  Filled 2020-09-26: qty 250

## 2020-09-26 MED ORDER — FAMOTIDINE IN NACL 20-0.9 MG/50ML-% IV SOLN
INTRAVENOUS | Status: AC
  Filled 2020-09-26: qty 50

## 2020-09-26 MED ORDER — POTASSIUM CHLORIDE 10 MEQ/100ML IV SOLN
INTRAVENOUS | Status: AC
  Filled 2020-09-26: qty 200

## 2020-09-26 MED ORDER — DIPHENOXYLATE-ATROPINE 2.5-0.025 MG PO TABS: 1.0000 | ORAL_TABLET | Freq: Four times a day (QID) | ORAL | 0 refills | Status: DC | PRN

## 2020-09-26 MED ORDER — SODIUM CHLORIDE 0.9 % IV SOLN
10.0000 mg | Freq: Once | INTRAVENOUS | Status: AC
  Administered 2020-09-26: 10 mg via INTRAVENOUS
  Filled 2020-09-26: qty 10

## 2020-09-26 NOTE — Patient Instructions (Addendum)
[facility] Discharge Instructions for Patients Receiving Chemotherapy  Today you received the following chemotherapy agents Traztuzumab, Paclitaxel.   To help prevent nausea and vomiting after your treatment, we encourage you to take your nausea medication as directed.   If you develop nausea and vomiting that is not controlled by your nausea medication, call the clinic.   BELOW ARE SYMPTOMS THAT SHOULD BE REPORTED IMMEDIATELY:  *FEVER GREATER THAN 100.5 F  *CHILLS WITH OR WITHOUT FEVER  NAUSEA AND VOMITING THAT IS NOT CONTROLLED WITH YOUR NAUSEA MEDICATION  *UNUSUAL SHORTNESS OF BREATH  *UNUSUAL BRUISING OR BLEEDING  TENDERNESS IN MOUTH AND THROAT WITH OR WITHOUT PRESENCE OF ULCERS  *URINARY PROBLEMS  *BOWEL PROBLEMS  UNUSUAL RASH Items with * indicate a potential emergency and should be followed up as soon as possible.  Feel free to call the clinic should you have any questions or concerns. The clinic phone number is [phone].  Please show the [facility] at check-in to the Emergency Department and triage nurse.  Potassium Chloride Extended-Release Capsules What is this medicine? POTASSIUM CHLORIDE (poe TASS i um KLOOR ide) is a potassium supplement used to prevent and to treat low potassium. Potassium is important for the heart, muscles, and nerves. Too much or too little potassium in the body can cause serious problems. This medicine may be used for other purposes; ask your health care provider or pharmacist if you have questions. COMMON BRAND NAME(S): Klor-Con, Micro-K, Micro-K Extencaps What should I tell my health care provider before I take this medicine? They need to know if you have any of these conditions:  Addison disease  dehydration  diabetes, high blood sugar  difficulty swallowing  heart disease  high levels of potassium in the blood  irregular heartbeat or rhythm  kidney disease  large areas of burned skin  stomach  ulcers, other stomach or intestine problems  an unusual or allergic reaction to potassium, other medicines, foods, dyes, or preservatives  pregnant or trying to get pregnant  breast-feeding How should I use this medicine? Take this drug by mouth with a glass of water. Take it as directed on the prescription label at the same time every day. Take it with food. Do not cut, crush, chew, or suck this drug. Swallow the capsules whole. You may open the capsule and put the contents in a teaspoon of soft food, such as applesauce or pudding. Do not add to hot foods. Swallow the mixture right away. Do not chew the mixture. Drink a glass of water or juice after taking the mixture. Keep taking this medicine unless your health care provider tells you to stop. Talk to your health care provider about the use of this drug in children. Special care may be needed. Overdosage: If you think you have taken too much of this medicine contact a poison control center or emergency room at once. NOTE: This medicine is only for you. Do not share this medicine with others. What if I miss a dose? If you miss a dose, take it as soon as you can. If it is almost time for your next dose, take only that dose. Do not take double or extra doses. What may interact with this medicine? Do not take this medicine with any of the following medications:  certain diuretics such as spironolactone, triamterene  certain medicines for stomach problems like atropine; difenoxin and glycopyrrolate  eplerenone  sodium polystyrene sulfonate This medicine may also interact with the following medications:  certain medicines for blood pressure or heart disease like lisinopril, losartan, quinapril, valsartan  medicines that lower your chance of fighting infection such as cyclosporine, tacrolimus  NSAIDs, medicines for pain and inflammation, like ibuprofen or naproxen  other potassium supplements  salt substitutes This list may not  describe all possible interactions. Give your health care provider a list of all the medicines, herbs, non-prescription drugs, or dietary supplements you use. Also tell them if you smoke, drink alcohol, or use illegal drugs. Some items may interact with your medicine. What should I watch for while using this medicine? Visit your doctor or health care professional for regular check ups. You will need lab work done regularly. You may need to be on a special diet while taking this medicine. Ask your doctor. What side effects may I notice from receiving this medicine? Side effects that you should report to your doctor or health care professional as soon as possible:  allergic reactions like skin rash, itching or hives, swelling of the face, lips, or tongue  black, tarry stools  breathing problems  confusion  heartburn  fast, irregular heartbeat  feeling faint or lightheaded, falls  low blood pressure  numbness or tingling in hands or feet  pain when swallowing  unusually weak or tired  weakness, heaviness of legs Side effects that usually do not require medical attention (report to your doctor or health care professional if they continue or are bothersome):  diarrhea  nausea, vomiting  stomach pain This list may not describe all possible side effects. Call your doctor for medical advice about side effects. You may report side effects to FDA at 1-800-FDA-1088. Where should I keep my medicine? Keep out of the reach of children. Store at room temperature between 15 and 30 degrees C (59 and 86 degrees F ). Keep bottle closed tightly to protect this medicine from light and moisture. Throw away any unused medicine after the expiration date. NOTE: This sheet is a summary. It may not cover all possible information. If you have questions about this medicine, talk to your doctor, pharmacist, or health care provider.  2021 Elsevier/Gold Standard (2019-04-10 16:43:28) Potassium chloride  injection What is this medicine? POTASSIUM CHLORIDE (poe TASS i um KLOOR ide) is a potassium supplement used to prevent and to treat low potassium. Potassium is important for the heart, muscles, and nerves. Too much or too little potassium in the body can cause serious problems. This medicine may be used for other purposes; ask your health care provider or pharmacist if you have questions. COMMON BRAND NAME(S): PROAMP What should I tell my health care provider before I take this medicine? They need to know if you have any of these conditions:  Addison disease  dehydration  diabetes (high blood sugar)  heart disease  high levels of potassium in the blood  irregular heartbeat or rhythm  kidney disease  large areas of burned skin  an unusual or allergic reaction to potassium, other medicines, foods, dyes, or preservatives  pregnant or trying to get pregnant  breast-feeding How should I use this medicine? This medicine is injected into a vein. It is given by a health care provider in a hospital or clinic setting. Talk to your health care provider about the use of this medicine in children. Special care may be needed. Overdosage: If you think you have taken too much of this medicine contact a poison control center or emergency room at once. NOTE: This medicine is only for you. Do not share  this medicine with others. What if I miss a dose? This does not apply. This medicine is not for regular use. What may interact with this medicine? Do not take this medicine with any of the following medications:  certain diuretics such as spironolactone, triamterene  eplerenone  sodium polystyrene sulfonate This medicine may also interact with the following medications:  certain medicines for blood pressure or heart disease like lisinopril, losartan, quinapril, valsartan  medicines that lower your chance of fighting infection such as cyclosporine, tacrolimus  NSAIDs, medicines for pain  and inflammation, like ibuprofen or naproxen  other potassium supplements  salt substitutes This list may not describe all possible interactions. Give your health care provider a list of all the medicines, herbs, non-prescription drugs, or dietary supplements you use. Also tell them if you smoke, drink alcohol, or use illegal drugs. Some items may interact with your medicine. What should I watch for while using this medicine? Visit your health care provider for regular checks on your progress. Tell your health care provider if your symptoms do not start to get better or if they get worse. You may need blood work while you are taking this medicine. Avoid salt substitutes unless you are told otherwise by your health care provider. What side effects may I notice from receiving this medicine? Side effects that you should report to your doctor or health care professional as soon as possible:  allergic reactions (skin rash, itching, hives, swelling of the face, lips, tongue, or throat)  confusion  high potassium levels (muscle weakness, fast or irregular heartbeat)  low blood pressure (dizziness, feeling faint or lightheaded, blurry vision)  pain, tingling, or numbness in lips, hands, or feet  pain, redness, or irritation at site where injected  trouble breathing Side effects that usually do not require medical attention (report to your doctor or health care professional if they continue or are bothersome):  diarrhea  nausea, vomiting  passing gas  stomach pain This list may not describe all possible side effects. Call your doctor for medical advice about side effects. You may report side effects to FDA at 1-800-FDA-1088. Where should I keep my medicine? This medicine is given in a hospital or clinic. It will not be stored at home. NOTE: This sheet is a summary. It may not cover all possible information. If you have questions about this medicine, talk to your doctor, pharmacist, or  health care provider.  2021 Elsevier/Gold Standard (2019-04-12 18:18:09)

## 2020-09-26 NOTE — Progress Notes (Signed)
Potassium Chloride 20 Meq over 2 hours entered as requested. No extra IVF needed.  Raul Del Rome, [place], BCPS, BCOP 09/26/2020 10:59 AM

## 2020-09-26 NOTE — Progress Notes (Signed)
MD states okay to treat with Tbili 1.8

## 2020-09-26 NOTE — Progress Notes (Signed)
CRITICAL VALUE STICKER  CRITICAL VALUE: K 2.7  Geraldo Docker, RN  DATE & TIME NOTIFIED: 09/26/20 at 22   MD NOTIFIED: Nicholas Lose, MD   RESPONSE: MD notified.  Verbal orders received for Okay to treat pt today.  Pt to receive Potassium 20 mEq IV today with tx and Potassium 20 mEq p.o daily at home.  orders placed and prescription sent to pharmacy on file.  Pt notified and verbalized understanding.

## 2020-09-29 ENCOUNTER — Other Ambulatory Visit (HOSPITAL_COMMUNITY): Payer: Self-pay

## 2020-09-29 ENCOUNTER — Encounter: Payer: Self-pay | Admitting: *Deleted

## 2020-09-29 ENCOUNTER — Other Ambulatory Visit (HOSPITAL_COMMUNITY): Payer: Self-pay | Admitting: Internal Medicine

## 2020-09-29 MED FILL — Sitagliptin Phosphate Tab 100 MG (Base Equiv): ORAL | 30 days supply | Qty: 30 | Fill #0 | Status: AC

## 2020-09-29 MED FILL — Potassium Chloride Microencapsulated Crys ER Tab 20 mEq: ORAL | 30 days supply | Qty: 30 | Fill #0 | Status: AC

## 2020-09-29 MED FILL — Diphenoxylate w/ Atropine Tab 2.5-0.025 MG: ORAL | 30 days supply | Qty: 30 | Fill #0 | Status: AC

## 2020-09-29 MED FILL — Ramipril Cap 2.5 MG: ORAL | 90 days supply | Qty: 45 | Fill #0 | Status: AC

## 2020-09-30 ENCOUNTER — Other Ambulatory Visit (HOSPITAL_COMMUNITY): Payer: Self-pay

## 2020-09-30 MED FILL — Continuous Glucose System Sensor: 28 days supply | Qty: 2 | Fill #0 | Status: AC

## 2020-10-02 ENCOUNTER — Other Ambulatory Visit (HOSPITAL_COMMUNITY): Payer: Self-pay

## 2020-10-02 MED ORDER — EMPAGLIFLOZIN 10 MG PO TABS
10.0000 mg | ORAL_TABLET | Freq: Every day | ORAL | 3 refills | Status: DC
  Filled 2020-10-02 – 2020-10-24 (×2): qty 30, 30d supply, fill #0
  Filled 2020-12-01: qty 30, 30d supply, fill #1
  Filled 2021-01-15: qty 30, 30d supply, fill #2
  Filled 2021-02-12: qty 30, 30d supply, fill #3

## 2020-10-03 ENCOUNTER — Inpatient Hospital Stay: Payer: 59 | Admitting: Dietician

## 2020-10-03 ENCOUNTER — Other Ambulatory Visit: Payer: Self-pay

## 2020-10-03 ENCOUNTER — Inpatient Hospital Stay: Payer: 59

## 2020-10-03 VITALS — BP 114/59 | HR 63 | Temp 98.0°F | Resp 16 | Wt 163.0 lb

## 2020-10-03 DIAGNOSIS — Z5112 Encounter for antineoplastic immunotherapy: Secondary | ICD-10-CM | POA: Diagnosis not present

## 2020-10-03 DIAGNOSIS — C50411 Malignant neoplasm of upper-outer quadrant of right female breast: Secondary | ICD-10-CM | POA: Diagnosis not present

## 2020-10-03 DIAGNOSIS — Z170 Estrogen receptor positive status [ER+]: Secondary | ICD-10-CM

## 2020-10-03 DIAGNOSIS — Z880 Allergy status to penicillin: Secondary | ICD-10-CM | POA: Diagnosis not present

## 2020-10-03 DIAGNOSIS — Z95828 Presence of other vascular implants and grafts: Secondary | ICD-10-CM

## 2020-10-03 DIAGNOSIS — Z801 Family history of malignant neoplasm of trachea, bronchus and lung: Secondary | ICD-10-CM | POA: Diagnosis not present

## 2020-10-03 LAB — CMP (CANCER CENTER ONLY)
ALT: 21 U/L (ref 0–44)
AST: 20 U/L (ref 15–41)
Albumin: 3.4 g/dL — ABNORMAL LOW (ref 3.5–5.0)
Alkaline Phosphatase: 75 U/L (ref 38–126)
Anion gap: 12 (ref 5–15)
BUN: <4 mg/dL — ABNORMAL LOW (ref 8–23)
CO2: 28 mmol/L (ref 22–32)
Calcium: 8.5 mg/dL — ABNORMAL LOW (ref 8.9–10.3)
Chloride: 105 mmol/L (ref 98–111)
Creatinine: 0.7 mg/dL (ref 0.44–1.00)
GFR, Estimated: >60 mL/min (ref >60)
Glucose, Bld: 128 mg/dL — ABNORMAL HIGH (ref 70–99)
Potassium: 3 mmol/L — ABNORMAL LOW (ref 3.5–5.1)
Sodium: 145 mmol/L (ref 135–145)
Total Bilirubin: 1.5 mg/dL — ABNORMAL HIGH (ref 0.3–1.2)
Total Protein: 6.2 g/dL — ABNORMAL LOW (ref 6.5–8.1)

## 2020-10-03 LAB — CBC WITH DIFFERENTIAL (CANCER CENTER ONLY)
Abs Immature Granulocytes: 0.05 10*3/uL (ref 0.00–0.07)
Basophils Absolute: 0 10*3/uL (ref 0.0–0.1)
Basophils Relative: 1 %
Eosinophils Absolute: 0.2 10*3/uL (ref 0.0–0.5)
Eosinophils Relative: 3 %
HCT: 36.7 % (ref 36.0–46.0)
Hemoglobin: 12.1 g/dL (ref 12.0–15.0)
Immature Granulocytes: 1 %
Lymphocytes Relative: 35 %
Lymphs Abs: 1.7 10*3/uL (ref 0.7–4.0)
MCH: 27.6 pg (ref 26.0–34.0)
MCHC: 33 g/dL (ref 30.0–36.0)
MCV: 83.8 fL (ref 80.0–100.0)
Monocytes Absolute: 0.4 10*3/uL (ref 0.1–1.0)
Monocytes Relative: 8 %
Neutro Abs: 2.4 10*3/uL (ref 1.7–7.7)
Neutrophils Relative %: 52 %
Platelet Count: 295 10*3/uL (ref 150–400)
RBC: 4.38 MIL/uL (ref 3.87–5.11)
RDW: 15.1 % (ref 11.5–15.5)
WBC Count: 4.7 10*3/uL (ref 4.0–10.5)
nRBC: 0 % (ref 0.0–0.2)

## 2020-10-03 MED ORDER — HEPARIN SOD (PORK) LOCK FLUSH 100 UNIT/ML IV SOLN
500.0000 [IU] | Freq: Once | INTRAVENOUS | Status: AC | PRN
  Administered 2020-10-03: 500 [IU]
  Filled 2020-10-03: qty 5

## 2020-10-03 MED ORDER — SODIUM CHLORIDE 0.9% FLUSH
10.0000 mL | INTRAVENOUS | Status: DC | PRN
  Administered 2020-10-03: 10 mL
  Filled 2020-10-03: qty 10

## 2020-10-03 MED ORDER — DIPHENHYDRAMINE HCL 50 MG/ML IJ SOLN
INTRAMUSCULAR | Status: AC
  Filled 2020-10-03: qty 1

## 2020-10-03 MED ORDER — SODIUM CHLORIDE 0.9 % IV SOLN
10.0000 mg | Freq: Once | INTRAVENOUS | Status: AC
  Administered 2020-10-03: 10 mg via INTRAVENOUS
  Filled 2020-10-03: qty 10

## 2020-10-03 MED ORDER — FAMOTIDINE IN NACL 20-0.9 MG/50ML-% IV SOLN
INTRAVENOUS | Status: AC
  Filled 2020-10-03: qty 50

## 2020-10-03 MED ORDER — SODIUM CHLORIDE 0.9% FLUSH
10.0000 mL | Freq: Once | INTRAVENOUS | Status: AC
  Administered 2020-10-03: 10 mL
  Filled 2020-10-03: qty 10

## 2020-10-03 MED ORDER — FAMOTIDINE IN NACL 20-0.9 MG/50ML-% IV SOLN
20.0000 mg | Freq: Once | INTRAVENOUS | Status: AC
  Administered 2020-10-03: 20 mg via INTRAVENOUS

## 2020-10-03 MED ORDER — ACETAMINOPHEN 325 MG PO TABS
650.0000 mg | ORAL_TABLET | Freq: Once | ORAL | Status: AC
  Administered 2020-10-03: 650 mg via ORAL

## 2020-10-03 MED ORDER — DIPHENHYDRAMINE HCL 50 MG/ML IJ SOLN
25.0000 mg | Freq: Once | INTRAMUSCULAR | Status: AC
Start: 2020-10-03 — End: 2020-10-03
  Administered 2020-10-03: 25 mg via INTRAVENOUS

## 2020-10-03 MED ORDER — PACLITAXEL CHEMO INJECTION 300 MG/50ML
80.0000 mg/m2 | Freq: Once | INTRAVENOUS | Status: AC
  Administered 2020-10-03: 150 mg via INTRAVENOUS
  Filled 2020-10-03: qty 25

## 2020-10-03 MED ORDER — TRASTUZUMAB-ANNS CHEMO 150 MG IV SOLR
150.0000 mg | Freq: Once | INTRAVENOUS | Status: AC
  Administered 2020-10-03: 150 mg via INTRAVENOUS
  Filled 2020-10-03: qty 7.14

## 2020-10-03 MED ORDER — SODIUM CHLORIDE 0.9 % IV SOLN
Freq: Once | INTRAVENOUS | Status: AC
Start: 2020-10-03 — End: 2020-10-03
  Filled 2020-10-03: qty 250

## 2020-10-03 MED ORDER — ACETAMINOPHEN 325 MG PO TABS
ORAL_TABLET | ORAL | Status: AC
  Filled 2020-10-03: qty 2

## 2020-10-03 NOTE — Patient Instructions (Signed)
Implanted Port Insertion, Care After This sheet gives you information about how to care for yourself after your procedure. Your health care provider may also give you more specific instructions. If you have problems or questions, contact your health care provider. What can I expect after the procedure? After the procedure, it is common to have:  Discomfort at the port insertion site.  Bruising on the skin over the port. This should improve over 3-4 days. Follow these instructions at home: Port care  After your port is placed, you will get a manufacturer's information card. The card has information about your port. Keep this card with you at all times.  Take care of the port as told by your health care provider. Ask your health care provider if you or a family member can get training for taking care of the port at home. A home health care nurse may also take care of the port.  Make sure to remember what type of port you have. Incision care  Follow instructions from your health care provider about how to take care of your port insertion site. Make sure you: ? Wash your hands with soap and water before and after you change your bandage (dressing). If soap and water are not available, use hand sanitizer. ? Change your dressing as told by your health care provider. ? Leave stitches (sutures), skin glue, or adhesive strips in place. These skin closures may need to stay in place for 2 weeks or longer. If adhesive strip edges start to loosen and curl up, you may trim the loose edges. Do not remove adhesive strips completely unless your health care provider tells you to do that.  Check your port insertion site every day for signs of infection. Check for: ? Redness, swelling, or pain. ? Fluid or blood. ? Warmth. ? Pus or a bad smell.      Activity  Return to your normal activities as told by your health care provider. Ask your health care provider what activities are safe for you.  Do not  lift anything that is heavier than 10 lb (4.5 kg), or the limit that you are told, until your health care provider says that it is safe. General instructions  Take over-the-counter and prescription medicines only as told by your health care provider.  Do not take baths, swim, or use a hot tub until your health care provider approves. Ask your health care provider if you may take showers. You may only be allowed to take sponge baths.  Do not drive for 24 hours if you were given a sedative during your procedure.  Wear a medical alert bracelet in case of an emergency. This will tell any health care providers that you have a port.  Keep all follow-up visits as told by your health care provider. This is important. Contact a health care provider if:  You cannot flush your port with saline as directed, or you cannot draw blood from the port.  You have a fever or chills.  You have redness, swelling, or pain around your port insertion site.  You have fluid or blood coming from your port insertion site.  Your port insertion site feels warm to the touch.  You have pus or a bad smell coming from the port insertion site. Get help right away if:  You have chest pain or shortness of breath.  You have bleeding from your port that you cannot control. Summary  Take care of the port as told by your   health care provider. Keep the manufacturer's information card with you at all times.  Change your dressing as told by your health care provider.  Contact a health care provider if you have a fever or chills or if you have redness, swelling, or pain around your port insertion site.  Keep all follow-up visits as told by your health care provider. This information is not intended to replace advice given to you by your health care provider. Make sure you discuss any questions you have with your health care provider. Document Revised: 01/10/2018 Document Reviewed: 01/10/2018 Elsevier Patient Education   2021 Elsevier Inc.  

## 2020-10-03 NOTE — Patient Instructions (Signed)
[facility] Discharge Instructions for Patients Receiving Chemotherapy  Today you received the following chemotherapy agents: Kanjinti, Taxol  To help prevent nausea and vomiting after your treatment, we encourage you to take your nausea medication as directed.    If you develop nausea and vomiting that is not controlled by your nausea medication, call the clinic.   BELOW ARE SYMPTOMS THAT SHOULD BE REPORTED IMMEDIATELY:  *FEVER GREATER THAN 100.5 F  *CHILLS WITH OR WITHOUT FEVER  NAUSEA AND VOMITING THAT IS NOT CONTROLLED WITH YOUR NAUSEA MEDICATION  *UNUSUAL SHORTNESS OF BREATH  *UNUSUAL BRUISING OR BLEEDING  TENDERNESS IN MOUTH AND THROAT WITH OR WITHOUT PRESENCE OF ULCERS  *URINARY PROBLEMS  *BOWEL PROBLEMS  UNUSUAL RASH Items with * indicate a potential emergency and should be followed up as soon as possible.  Feel free to call the clinic should you have any questions or concerns. The clinic phone number is [phone].  Please show the [facility] at check-in to the Emergency Department and triage nurse.

## 2020-10-03 NOTE — Progress Notes (Signed)
Nutrition Assessment   Reason for Assessment: Patient request   ASSESSMENT: 65 year old female with right breast cancer . Patient receiving neoadjuvant chemotherapy. She is followed by Dr. Lindi Adie.  Past medical history of DM, HLD, depression, IBS, NASH  Met with patient in infusion. She reports appetite comes and goes. She has not eaten today, recalls eating a Big Mac yesterday with diarrhea after eating. Patient reports multiple episodes of  diarrhea after eating, stools are fatty and foul smelling. She limits intake on days she has to work due to fecal urgency. Patient previously prescribed Creon which she is not taking. Patient reports poorly fitting dentures and no taste. She has had one episode of nausea and vomiting.   Nutrition Focused Physical Exam: deferred   Medications: Jardine, Humalog, Compazine, Zofran, Creon   Labs: Glucose 123, K 3, Albumin 3.4   Anthropometrics: Patient is 12 lbs (6.8%) under reported usual weight  Height: 5'4" Weight: 163 lb  UBW: 175 lb (per pt) BMI: 27.98    NUTRITION DIAGNOSIS: Unintentional weight loss related to cancer and related treatment as evidenced by diarrhea, decreased appetite, and 12 lbs (6.8%) decrease from usual weight; insignificant  INTERVENTION:  Educated on prescribed pancreatic enzymes and when to take, handout provided Discussed strategies for managing diarrhea, handout provided Discussed strategies for altered taste, handout provided Encouraged oral rinse 4 times daily, recipe given Encouraged small frequent meals throughout the day Sample of Ensure Clear and [employer] [place] provided Ensure coupons RD contact information given   MONITORING, EVALUATION, GOAL: Patient will maintain adequate calories and protein to prevent weight loss   Next Visit: Friday April 29 in infusion

## 2020-10-06 ENCOUNTER — Encounter: Payer: Self-pay | Admitting: *Deleted

## 2020-10-07 ENCOUNTER — Telehealth: Payer: Self-pay | Admitting: Hematology and Oncology

## 2020-10-07 NOTE — Telephone Encounter (Signed)
Scheduled per 4/11 sch msg. Called pt and left a msg

## 2020-10-07 NOTE — Progress Notes (Deleted)
[facility] OFFICE PROGRESS NOTE  Shon Baton, MD [place] [address]  DIAGNOSIS: Stage IB (cT2, cN0, cM0, G3, ER+, PR+, HER2+) Right sided breast cancer  Oncology History  Malignant neoplasm of upper-outer quadrant of right breast in female, estrogen receptor positive ([place])  08/08/2020 Initial Diagnosis   Screening detected right breast mass 2.2 cm by ultrasound upper outer quadrant biopsy: Grade 2-3 IDC with DCIS ER 60%, PR 20%, Ki-67 15%, HER-2 +3+   08/15/2020 Cancer Staging   Staging form: Breast, AJCC 8th Edition - Clinical stage from 08/15/2020: Stage IB (cT2, cN0, cM0, G3, ER+, PR+, HER2+) - Signed by Nicholas Lose, MD on 08/15/2020 Stage prefix: Initial diagnosis   08/27/2020 -  Chemotherapy    Patient is on Treatment Plan: BREAST PACLITAXEL + TRASTUZUMAB Q7D / TRASTUZUMAB Q21D          CURRENT THERAPY: Cycle7Taxol Herceptin scheduled for today  INTERVAL HISTORY: Rachel Gaines 65 y.o. female returns to the clinic today for a follow up visit. The patient is feeling _ today without any concerning complaints except for _. (diarrhea?).  Denies blood in the stool, fevers, or chills. The patient was previously prescribed creon by _ in the past which she has not been taking. She was evaluated by the registered dietician   The patient is currently undergoing weekly treatment with Taxol and Herceptin and she is tolerating her treatment fairly well except for intermittent diarrhea. Today she denies any fever, chills, or night sweats.  She denies any nausea or constipation. Vomiting? She denies any shortness of breath, cough, hemoptysis, or chest pain. She noticed an episode of tingling in her fingers since the interval of her last infusion. She does not do cryotherapy. No numbness of tingling at this time. The patient is here today for evaluation and repeat lab work before starting week 7 of Taxol and Herceptin.    MEDICAL HISTORY: Past Medical History:   Diagnosis Date  . Abnormal Pap smear of cervix 2014   ASCUS with negative HR HPV   . Anemia   . Cancer [facility]) 06/2020   right breast IDC with DCIS  . Cataract   . Diabetes mellitus without complication ([place])   . Hyperlipidemia    on medicine  . Plantar fasciitis   . 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  MEDICATIONS:  Current Outpatient Medications  Medication Sig Dispense Refill  . albuterol (VENTOLIN HFA) 108 (90 Base) MCG/ACT inhaler INHALE 1 TO 2 PUFFS BY MOUTH INTO THE LUNGS EVERY 4 TO 6 HOURS AS NEEDED FOR SHORTNESS OF BREATH/WHEEZING/COUGH URGENCY 18 g 0  . aspirin EC 81 MG tablet Take 81 mg by mouth daily.    . benzonatate (TESSALON) 200 MG capsule TAKE 1 CAPSULE BY MOUTH 3 TIMES DAILY AS NEEDED 30 capsule 0  . Biotin 5 MG CAPS Take 1 capsule by mouth daily.    . Continuous Blood Gluc Sensor (FREESTYLE LIBRE 2 SENSOR) MISC CHANGE EVERY 14 DAYS TO MONITOR BLOOD GLUCOSE CONTINUOSLY    . Continuous Blood Gluc Sensor (FREESTYLE LIBRE 2 SENSOR) MISC CHANGE EVERY 14 DAYS TO MONITOR BLOOD GLUCOSE CONTINUOSLY 2 each 12  . diclofenac Sodium (VOLTAREN) 1 % GEL SMARTSIG:2 Gram(s) Topical 1 to 4 Times Daily PRN    . diphenoxylate-atropine (LOMOTIL) 2.5-0.025 MG tablet TAKE 1 TABLET BY MOUTH 4 (FOUR) TIMES DAILY AS NEEDED FOR DIARRHEA OR LOOSE STOOLS. 30 tablet 0  .  empagliflozin (JARDIANCE) 10 MG TABS tablet Take 10 mg by mouth daily.    . empagliflozin (JARDIANCE) 10 MG TABS tablet TAKE 1 TABLET BY MOUTH ONCE A DAY 30 tablet 3  . FREESTYLE LITE test strip     . gabapentin (NEURONTIN) 300 MG capsule Take 300 mg by mouth at bedtime. Only takes at night    . HUMALOG KWIKPEN 100 UNIT/ML KwikPen 10 Units 3 (three) times daily.    . insulin detemir (LEVEMIR) 100 UNIT/ML FlexPen INJECT 34 UNITS SUBCUTANEOUSLY TWICE DAILY 15 mL 3  . Insulin Glargine (BASAGLAR KWIKPEN) 100 UNIT/ML INJECT 34 UNITS UNDER THE SKIN TWICE DAILY. 33 mL 3  .  Insulin Glargine (BASAGLAR KWIKPEN) 100 UNIT/ML INJECT 34 UNITS UNDER THE SKIN TWICE DAILY 45 mL 3  . Insulin Glargine-yfgn 100 UNIT/ML SOPN INJECT 34 UNITS SUBCUTANEOUSLY TWICE DAILY 15 mL 3  . LANTUS SOLOSTAR 100 UNIT/ML Solostar Pen Inject 34 Units into the skin 2 (two) times daily. If CBG <80 in AM, take only 20 units.   1  . levofloxacin (LEVAQUIN) 500 MG tablet TAKE 1 TABLET BY MOUTH ONCE DAILY FOR 7 DAYS. 7 tablet 0  . lidocaine-prilocaine (EMLA) cream APPLY 1 APPLICATION TOPICALLY AS NEEDED. 30 g 0  . NONFORMULARY OR COMPOUNDED ITEM [facility]:  Antiinflammatory cream - Diclofenac 3%, Baclofen 2%, Cyclobenzaprine 2%, Lidocaine 2%, dispense 120grams, apply 1-2 grams to affected area 3-4 times a day, +2refills. 120 each 2  . ondansetron (ZOFRAN) 8 MG tablet TAKE 1 TABLET (8 MG TOTAL) BY MOUTH EVERY 8 (EIGHT) HOURS AS NEEDED FOR NAUSEA OR VOMITING. 30 tablet 0  . Pancrelipase, Lip-Prot-Amyl, (ZENPEP) 40000-126000 units CPEP Take 1 capsule by mouth in the morning and at bedtime. 120 capsule   . Pancrelipase, Lip-Prot-Amyl, 40000-126000 units CPEP TAKE 2 CAPSULES BY MOUTH WITH MEALS AND 1 CAPSULE WITH SNACKS 240 capsule 12  . potassium chloride SA (KLOR-CON) 20 MEQ tablet TAKE 1 TABLET (20 MEQ TOTAL) BY MOUTH DAILY. 30 tablet 0  . prochlorperazine (COMPAZINE) 10 MG tablet TAKE 1 TABLET (10 MG TOTAL) BY MOUTH EVERY 6 (SIX) HOURS AS NEEDED FOR NAUSEA OR VOMITING. 30 tablet 0  . ramipril (ALTACE) 2.5 MG capsule Take 2.5 mg by mouth every other day.     . ramipril (ALTACE) 2.5 MG capsule TAKE 1 CAPSULE BY MOUTH EVERY OTHER DAY 45 capsule 3  . rosuvastatin (CRESTOR) 40 MG tablet Take 1 tablet by mouth daily.    . rosuvastatin (CRESTOR) 40 MG tablet TAKE 1 TABLET BY MOUTH ONCE DAILY 90 tablet 2  . sitaGLIPtin (JANUVIA) 100 MG tablet Take 100 mg by mouth daily.    . sitaGLIPtin (JANUVIA) 100 MG tablet TAKE 1 TABLET BY MOUTH ONCE DAILY 30 tablet 5  . sitaGLIPtin (JANUVIA) 100 MG tablet TAKE 1  TABLET BY MOUTH ONCE DAILY 90 tablet 5  . traMADol (ULTRAM) 50 MG tablet TAKE 1 TABLET (50 MG TOTAL) BY MOUTH EVERY 6 (SIX) HOURS AS NEEDED. 10 tablet 0  . UNIFINE PENTIPS 32G X 4 MM MISC USE WITH LEVEMIR AND HUMALOG 4X A DAY DX-E11.65    . valACYclovir (VALTREX) 500 MG tablet Take 1 tablet (500 mg total) by mouth 2 (two) times daily. Take for 3 days for outbreak 30 tablet 2  . VITAMIN D PO Take by mouth.     No current facility-administered medications for this visit.    SURGICAL HISTORY:  Past Surgical History:  Procedure Laterality Date  . CATARACT EXTRACTION  2019  .  COLONOSCOPY    . COLPOSCOPY W/ BIOPSY / CURETTAGE  02/04/1999   Chronic cervicitis  . EYE SURGERY    . PORTACATH PLACEMENT N/A 08/26/2020   Procedure: INSERTION PORT-A-CATH;  Surgeon: Rolm Bookbinder, MD;  Location: [facility];  Service: General;  Laterality: N/A;  START TIME OF 12:30 PM FOR 60 MINUTES ROOM 8    REVIEW OF SYSTEMS:   Review of Systems  Constitutional: Negative for appetite change, chills, fatigue, fever and unexpected weight change.  HENT:   Negative for mouth sores, nosebleeds, sore throat and trouble swallowing.   Eyes: Negative for eye problems and icterus.  Respiratory: Negative for cough, hemoptysis, shortness of breath and wheezing.   Cardiovascular: Negative for chest pain and leg swelling.  Gastrointestinal: Negative for abdominal pain, constipation, diarrhea, nausea and vomiting.  Genitourinary: Negative for bladder incontinence, difficulty urinating, dysuria, frequency and hematuria.   Musculoskeletal: Negative for back pain, gait problem, neck pain and neck stiffness.  Skin: Negative for itching and rash.  Neurological: Negative for dizziness, extremity weakness, gait problem, headaches, light-headedness and seizures.  Hematological: Negative for adenopathy. Does not bruise/bleed easily.  Psychiatric/Behavioral: Negative for confusion, depression and sleep disturbance. The  patient is not nervous/anxious.     PHYSICAL EXAMINATION:  Last menstrual period 06/28/2006.  ECOG PERFORMANCE STATUS: {CHL ONC ECOG [id number]  Physical Exam  Constitutional: Oriented to person, place, and time and well-developed, well-nourished, and in no distress. No distress.  HENT:  Head: Normocephalic and atraumatic.  Mouth/Throat: Oropharynx is clear and moist. No oropharyngeal exudate.  Eyes: Conjunctivae are normal. Right eye exhibits no discharge. Left eye exhibits no discharge. No scleral icterus.  Neck: Normal range of motion. Neck supple.  Cardiovascular: Normal rate, regular rhythm, normal heart sounds and intact distal pulses.   Pulmonary/Chest: Effort normal and breath sounds normal. No respiratory distress. No wheezes. No rales.  Abdominal: Soft. Bowel sounds are normal. Exhibits no distension and no mass. There is no tenderness.  Musculoskeletal: Normal range of motion. Exhibits no edema.  Lymphadenopathy:    No cervical adenopathy.  Neurological: Alert and oriented to person, place, and time. Exhibits normal muscle tone. Gait normal. Coordination normal.  Skin: Skin is warm and dry. No rash noted. Not diaphoretic. No erythema. No pallor.  Psychiatric: Mood, memory and judgment normal.  Vitals reviewed.  LABORATORY DATA: Lab Results  Component Value Date   WBC 4.7 10/03/2020   HGB 12.1 10/03/2020   HCT 36.7 10/03/2020   MCV 83.8 10/03/2020   PLT 295 10/03/2020      Chemistry      Component Value Date/Time   NA 145 10/03/2020 0815   NA 142 06/18/2019 0956   K 3.0 (L) 10/03/2020 0815   CL 105 10/03/2020 0815   CO2 28 10/03/2020 0815   BUN <4 (L) 10/03/2020 0815   BUN 10 06/18/2019 0956   CREATININE 0.70 10/03/2020 0815      Component Value Date/Time   CALCIUM 8.5 (L) 10/03/2020 0815   ALKPHOS 75 10/03/2020 0815   AST 20 10/03/2020 0815   ALT 21 10/03/2020 0815   BILITOT 1.5 (H) 10/03/2020 0815       RADIOGRAPHIC STUDIES:  No results  found.   ASSESSMENT/PLAN:  No problem-specific Assessment & Plan notes found for this encounter.   No orders of the defined types were placed in this encounter.    I spent {CHL ONC TIME VISIT - GYBWL:[id number]} counseling the patient face to face. The total time spent in the  appointment was {CHL ONC TIME VISIT - MZTAE:[id number]}.  Angelis Gates L Audri Kozub, PA-C 10/07/20

## 2020-10-08 NOTE — Progress Notes (Signed)
Patient Care Team: Shon Baton, MD as PCP - General (Internal Medicine) Buford Dresser, MD as PCP - Cardiology (Cardiology) Mauro Kaufmann, RN as Oncology Nurse Navigator Rockwell Germany, RN as Oncology Nurse Navigator  DIAGNOSIS:    ICD-10-CM   1. Malignant neoplasm of upper-outer quadrant of right breast in female, estrogen receptor positive ([place])  C50.411    Z17.0     SUMMARY OF ONCOLOGIC HISTORY: Oncology History  Malignant neoplasm of upper-outer quadrant of right breast in female, estrogen receptor positive ([place])  08/08/2020 Initial Diagnosis   Screening detected right breast mass 2.2 cm by ultrasound upper outer quadrant biopsy: Grade 2-3 IDC with DCIS ER 60%, PR 20%, Ki-67 15%, HER-2 +3+   08/15/2020 Cancer Staging   Staging form: Breast, AJCC 8th Edition - Clinical stage from 08/15/2020: Stage IB (cT2, cN0, cM0, G3, ER+, PR+, HER2+) - Signed by Nicholas Lose, MD on 08/15/2020 Stage prefix: Initial diagnosis   08/27/2020 -  Chemotherapy    Patient is on Treatment Plan: BREAST PACLITAXEL + TRASTUZUMAB Q7D / TRASTUZUMAB Q21D        CHIEF COMPLIANT: Cycle4Taxol Herceptin  INTERVAL HISTORY: Rachel Gaines is a 65 y.o. with above-mentioned history of right breast cancer currently on neoadjuvant chemotherapy withTaxol Herceptin.She presents to the clinic today for cycle4.  ALLERGIES:  is allergic to penicillins.  MEDICATIONS:  Current Outpatient Medications  Medication Sig Dispense Refill  . albuterol (VENTOLIN HFA) 108 (90 Base) MCG/ACT inhaler INHALE 1 TO 2 PUFFS BY MOUTH INTO THE LUNGS EVERY 4 TO 6 HOURS AS NEEDED FOR SHORTNESS OF BREATH/WHEEZING/COUGH URGENCY 18 g 0  . aspirin EC 81 MG tablet Take 81 mg by mouth daily.    . benzonatate (TESSALON) 200 MG capsule TAKE 1 CAPSULE BY MOUTH 3 TIMES DAILY AS NEEDED 30 capsule 0  . Biotin 5 MG CAPS Take 1 capsule by mouth daily.    . Continuous Blood Gluc Sensor (FREESTYLE LIBRE 2 SENSOR) MISC CHANGE EVERY 14  DAYS TO MONITOR BLOOD GLUCOSE CONTINUOSLY    . Continuous Blood Gluc Sensor (FREESTYLE LIBRE 2 SENSOR) MISC CHANGE EVERY 14 DAYS TO MONITOR BLOOD GLUCOSE CONTINUOSLY 2 each 12  . diclofenac Sodium (VOLTAREN) 1 % GEL SMARTSIG:2 Gram(s) Topical 1 to 4 Times Daily PRN    . diphenoxylate-atropine (LOMOTIL) 2.5-0.025 MG tablet TAKE 1 TABLET BY MOUTH 4 (FOUR) TIMES DAILY AS NEEDED FOR DIARRHEA OR LOOSE STOOLS. 30 tablet 0  . empagliflozin (JARDIANCE) 10 MG TABS tablet Take 10 mg by mouth daily.    . empagliflozin (JARDIANCE) 10 MG TABS tablet TAKE 1 TABLET BY MOUTH ONCE A DAY 30 tablet 3  . FREESTYLE LITE test strip     . gabapentin (NEURONTIN) 300 MG capsule Take 300 mg by mouth at bedtime. Only takes at night    . HUMALOG KWIKPEN 100 UNIT/ML KwikPen 10 Units 3 (three) times daily.    . insulin detemir (LEVEMIR) 100 UNIT/ML FlexPen INJECT 34 UNITS SUBCUTANEOUSLY TWICE DAILY 15 mL 3  . Insulin Glargine (BASAGLAR KWIKPEN) 100 UNIT/ML INJECT 34 UNITS UNDER THE SKIN TWICE DAILY. 33 mL 3  . Insulin Glargine (BASAGLAR KWIKPEN) 100 UNIT/ML INJECT 34 UNITS UNDER THE SKIN TWICE DAILY 45 mL 3  . Insulin Glargine-yfgn 100 UNIT/ML SOPN INJECT 34 UNITS SUBCUTANEOUSLY TWICE DAILY 15 mL 3  . LANTUS SOLOSTAR 100 UNIT/ML Solostar Pen Inject 34 Units into the skin 2 (two) times daily. If CBG <80 in AM, take only 20 units.   1  . levofloxacin (  LEVAQUIN) 500 MG tablet TAKE 1 TABLET BY MOUTH ONCE DAILY FOR 7 DAYS. 7 tablet 0  . lidocaine-prilocaine (EMLA) cream APPLY 1 APPLICATION TOPICALLY AS NEEDED. 30 g 0  . NONFORMULARY OR COMPOUNDED ITEM [facility]:  Antiinflammatory cream - Diclofenac 3%, Baclofen 2%, Cyclobenzaprine 2%, Lidocaine 2%, dispense 120grams, apply 1-2 grams to affected area 3-4 times a day, +2refills. 120 each 2  . ondansetron (ZOFRAN) 8 MG tablet TAKE 1 TABLET (8 MG TOTAL) BY MOUTH EVERY 8 (EIGHT) HOURS AS NEEDED FOR NAUSEA OR VOMITING. 30 tablet 0  . Pancrelipase, Lip-Prot-Amyl, (ZENPEP)  40000-126000 units CPEP Take 1 capsule by mouth in the morning and at bedtime. 120 capsule   . Pancrelipase, Lip-Prot-Amyl, 40000-126000 units CPEP TAKE 2 CAPSULES BY MOUTH WITH MEALS AND 1 CAPSULE WITH SNACKS 240 capsule 12  . potassium chloride SA (KLOR-CON) 20 MEQ tablet TAKE 1 TABLET (20 MEQ TOTAL) BY MOUTH DAILY. 30 tablet 0  . prochlorperazine (COMPAZINE) 10 MG tablet TAKE 1 TABLET (10 MG TOTAL) BY MOUTH EVERY 6 (SIX) HOURS AS NEEDED FOR NAUSEA OR VOMITING. 30 tablet 0  . ramipril (ALTACE) 2.5 MG capsule Take 2.5 mg by mouth every other day.     . ramipril (ALTACE) 2.5 MG capsule TAKE 1 CAPSULE BY MOUTH EVERY OTHER DAY 45 capsule 3  . rosuvastatin (CRESTOR) 40 MG tablet Take 1 tablet by mouth daily.    . rosuvastatin (CRESTOR) 40 MG tablet TAKE 1 TABLET BY MOUTH ONCE DAILY 90 tablet 2  . sitaGLIPtin (JANUVIA) 100 MG tablet Take 100 mg by mouth daily.    . sitaGLIPtin (JANUVIA) 100 MG tablet TAKE 1 TABLET BY MOUTH ONCE DAILY 30 tablet 5  . sitaGLIPtin (JANUVIA) 100 MG tablet TAKE 1 TABLET BY MOUTH ONCE DAILY 90 tablet 5  . traMADol (ULTRAM) 50 MG tablet TAKE 1 TABLET (50 MG TOTAL) BY MOUTH EVERY 6 (SIX) HOURS AS NEEDED. 10 tablet 0  . UNIFINE PENTIPS 32G X 4 MM MISC USE WITH LEVEMIR AND HUMALOG 4X A DAY DX-E11.65    . valACYclovir (VALTREX) 500 MG tablet Take 1 tablet (500 mg total) by mouth 2 (two) times daily. Take for 3 days for outbreak 30 tablet 2  . VITAMIN D PO Take by mouth.     No current facility-administered medications for this visit.    PHYSICAL EXAMINATION: ECOG PERFORMANCE STATUS: 1 - Symptomatic but completely ambulatory  There were no vitals filed for this visit. There were no vitals filed for this visit.  LABORATORY DATA:  I have reviewed the data as listed CMP Latest Ref Rng & Units 10/03/2020 09/26/2020 09/19/2020  Glucose 70 - 99 mg/dL 128(H) 135(H) 110(H)  BUN 8 - 23 mg/dL <4(L) 5(L) 7(L)  Creatinine 0.44 - 1.00 mg/dL 0.70 0.73 0.82  Sodium 135 - 145 mmol/L 145  143 143  Potassium 3.5 - 5.1 mmol/L 3.0(L) 2.7(LL) 3.5  Chloride 98 - 111 mmol/L 105 103 107  CO2 22 - 32 mmol/L '28 27 24  ' Calcium 8.9 - 10.3 mg/dL 8.5(L) 9.1 9.7  Total Protein 6.5 - 8.1 g/dL 6.2(L) 6.7 7.7  Total Bilirubin 0.3 - 1.2 mg/dL 1.5(H) 1.8(H) 1.6(H)  Alkaline Phos 38 - 126 U/L 75 82 90  AST 15 - 41 U/L '20 19 18  ' ALT 0 - 44 U/L '21 24 21    ' Lab Results  Component Value Date   WBC 4.4 10/09/2020   HGB 11.9 (L) 10/09/2020   HCT 36.5 10/09/2020   MCV 84.3 10/09/2020  PLT 273 10/09/2020   NEUTROABS 2.0 10/09/2020    ASSESSMENT & PLAN:  Malignant neoplasm of upper-outer quadrant of right breast in female, estrogen receptor positive ([place]) 08/08/2020:Screening detected right breast mass 2.2 cm by ultrasound upper outer quadrant biopsy: Grade 2-3 IDC with DCIS ER 60%, PR 20%, Ki-67 15%, HER-2 +3+ T2N0 stage Ia Treatment plan: 1.Neoadjuvant chemotherapy with Taxol and Herceptin weekly x12 followed by Herceptin maintenance 2.breast conserving surgery with some Huntington biopsy 3.Adjuvant radiation 4.Adjuvant antiestrogen therapy  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 Diarrhea: She has diarrhea every other day in spite of Lomotil.  I will reduce the dosage of Taxol for this reason..  2. Mild fatigue 3. Hypokalemia-likely secondary to diarrhea.  On oral potassium 20 meq daily  4. Decreased appetite/weight loss-Will follow up with nutrition. Encouraged diabetic supplemental protein drinks.  She is still working full-time and is taking care of patients.  Return to clinic weekly for chemo and every other week for follow-up with [place]    No orders of the defined types were placed in this encounter.  The patient has a good understanding of the overall plan. she agrees with it. she will call with any problems that may develop before the next visit  here.  Total time spent: 30 mins including face to face time and time spent for planning, charting and coordination of care  Rulon Eisenmenger, MD, MPH 10/09/2020  I, Molly Dorshimer, am acting as scribe for Dr. Nicholas Lose.  I have reviewed the above documentation for accuracy and completeness, and I agree with the above.

## 2020-10-08 NOTE — Assessment & Plan Note (Signed)
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 Diarrhea: she has imodium and will take it for diarrhea. I refilled her lomotil to [place] if needed for her diarrhea.  2. Mild fatigue 3. Hypokalemia-likely secondary to diarrhea.  On oral potassium 20 meq daily  4. Decreased appetite/weight loss-Will follow up with nutrition. Encouraged diabetic supplemental protein drinks.   Return to clinic weekly for chemo and every other week for follow-up with [place]

## 2020-10-09 ENCOUNTER — Other Ambulatory Visit: Payer: Self-pay

## 2020-10-09 ENCOUNTER — Ambulatory Visit: Payer: 59 | Admitting: Physician Assistant

## 2020-10-09 ENCOUNTER — Inpatient Hospital Stay: Payer: 59

## 2020-10-09 ENCOUNTER — Inpatient Hospital Stay (HOSPITAL_BASED_OUTPATIENT_CLINIC_OR_DEPARTMENT_OTHER): Payer: 59 | Admitting: Hematology and Oncology

## 2020-10-09 DIAGNOSIS — C50411 Malignant neoplasm of upper-outer quadrant of right female breast: Secondary | ICD-10-CM

## 2020-10-09 DIAGNOSIS — Z880 Allergy status to penicillin: Secondary | ICD-10-CM | POA: Diagnosis not present

## 2020-10-09 DIAGNOSIS — Z801 Family history of malignant neoplasm of trachea, bronchus and lung: Secondary | ICD-10-CM | POA: Diagnosis not present

## 2020-10-09 DIAGNOSIS — Z170 Estrogen receptor positive status [ER+]: Secondary | ICD-10-CM

## 2020-10-09 DIAGNOSIS — Z95828 Presence of other vascular implants and grafts: Secondary | ICD-10-CM

## 2020-10-09 DIAGNOSIS — Z5112 Encounter for antineoplastic immunotherapy: Secondary | ICD-10-CM | POA: Diagnosis not present

## 2020-10-09 LAB — CMP (CANCER CENTER ONLY)
ALT: 20 U/L (ref 0–44)
AST: 20 U/L (ref 15–41)
Albumin: 3.5 g/dL (ref 3.5–5.0)
Alkaline Phosphatase: 78 U/L (ref 38–126)
Anion gap: 8 (ref 5–15)
BUN: <4 mg/dL — ABNORMAL LOW (ref 8–23)
CO2: 28 mmol/L (ref 22–32)
Calcium: 9.2 mg/dL (ref 8.9–10.3)
Chloride: 107 mmol/L (ref 98–111)
Creatinine: 0.72 mg/dL (ref 0.44–1.00)
GFR, Estimated: >60 mL/min (ref >60)
Glucose, Bld: 149 mg/dL — ABNORMAL HIGH (ref 70–99)
Potassium: 3.4 mmol/L — ABNORMAL LOW (ref 3.5–5.1)
Sodium: 143 mmol/L (ref 135–145)
Total Bilirubin: 1.6 mg/dL — ABNORMAL HIGH (ref 0.3–1.2)
Total Protein: 6.2 g/dL — ABNORMAL LOW (ref 6.5–8.1)

## 2020-10-09 LAB — CBC WITH DIFFERENTIAL (CANCER CENTER ONLY)
Abs Immature Granulocytes: 0.04 10*3/uL (ref 0.00–0.07)
Basophils Absolute: 0.1 10*3/uL (ref 0.0–0.1)
Basophils Relative: 1 %
Eosinophils Absolute: 0.1 10*3/uL (ref 0.0–0.5)
Eosinophils Relative: 2 %
HCT: 36.5 % (ref 36.0–46.0)
Hemoglobin: 11.9 g/dL — ABNORMAL LOW (ref 12.0–15.0)
Immature Granulocytes: 1 %
Lymphocytes Relative: 41 %
Lymphs Abs: 1.8 10*3/uL (ref 0.7–4.0)
MCH: 27.5 pg (ref 26.0–34.0)
MCHC: 32.6 g/dL (ref 30.0–36.0)
MCV: 84.3 fL (ref 80.0–100.0)
Monocytes Absolute: 0.4 10*3/uL (ref 0.1–1.0)
Monocytes Relative: 8 %
Neutro Abs: 2 10*3/uL (ref 1.7–7.7)
Neutrophils Relative %: 47 %
Platelet Count: 273 10*3/uL (ref 150–400)
RBC: 4.33 MIL/uL (ref 3.87–5.11)
RDW: 15.7 % — ABNORMAL HIGH (ref 11.5–15.5)
WBC Count: 4.4 10*3/uL (ref 4.0–10.5)
nRBC: 0 % (ref 0.0–0.2)

## 2020-10-09 MED ORDER — HEPARIN SOD (PORK) LOCK FLUSH 100 UNIT/ML IV SOLN
500.0000 [IU] | Freq: Once | INTRAVENOUS | Status: AC
  Administered 2020-10-09: 500 [IU]
  Filled 2020-10-09: qty 5

## 2020-10-09 MED ORDER — SODIUM CHLORIDE 0.9% FLUSH
10.0000 mL | Freq: Once | INTRAVENOUS | Status: AC
  Administered 2020-10-09: 10 mL
  Filled 2020-10-09: qty 10

## 2020-10-09 NOTE — Patient Instructions (Signed)

## 2020-10-10 ENCOUNTER — Inpatient Hospital Stay: Payer: 59

## 2020-10-10 ENCOUNTER — Other Ambulatory Visit: Payer: 59

## 2020-10-10 VITALS — BP 118/59 | HR 78 | Temp 98.4°F | Resp 18

## 2020-10-10 DIAGNOSIS — C50411 Malignant neoplasm of upper-outer quadrant of right female breast: Secondary | ICD-10-CM | POA: Diagnosis not present

## 2020-10-10 DIAGNOSIS — Z170 Estrogen receptor positive status [ER+]: Secondary | ICD-10-CM

## 2020-10-10 DIAGNOSIS — Z801 Family history of malignant neoplasm of trachea, bronchus and lung: Secondary | ICD-10-CM | POA: Diagnosis not present

## 2020-10-10 DIAGNOSIS — Z880 Allergy status to penicillin: Secondary | ICD-10-CM | POA: Diagnosis not present

## 2020-10-10 DIAGNOSIS — Z5112 Encounter for antineoplastic immunotherapy: Secondary | ICD-10-CM | POA: Diagnosis not present

## 2020-10-10 MED ORDER — ACETAMINOPHEN 325 MG PO TABS
ORAL_TABLET | ORAL | Status: AC
  Filled 2020-10-10: qty 2

## 2020-10-10 MED ORDER — FAMOTIDINE IN NACL 20-0.9 MG/50ML-% IV SOLN
20.0000 mg | Freq: Once | INTRAVENOUS | Status: AC
  Administered 2020-10-10: 20 mg via INTRAVENOUS

## 2020-10-10 MED ORDER — FAMOTIDINE IN NACL 20-0.9 MG/50ML-% IV SOLN
INTRAVENOUS | Status: AC
  Filled 2020-10-10: qty 50

## 2020-10-10 MED ORDER — SODIUM CHLORIDE 0.9 % IV SOLN
65.0000 mg/m2 | Freq: Once | INTRAVENOUS | Status: AC
  Administered 2020-10-10: 120 mg via INTRAVENOUS
  Filled 2020-10-10: qty 20

## 2020-10-10 MED ORDER — HEPARIN SOD (PORK) LOCK FLUSH 100 UNIT/ML IV SOLN
500.0000 [IU] | Freq: Once | INTRAVENOUS | Status: AC | PRN
  Administered 2020-10-10: 500 [IU]
  Filled 2020-10-10: qty 5

## 2020-10-10 MED ORDER — ACETAMINOPHEN 325 MG PO TABS
650.0000 mg | ORAL_TABLET | Freq: Once | ORAL | Status: AC
  Administered 2020-10-10: 650 mg via ORAL

## 2020-10-10 MED ORDER — SODIUM CHLORIDE 0.9% FLUSH
10.0000 mL | INTRAVENOUS | Status: DC | PRN
Start: 2020-10-10 — End: 2020-10-10
  Administered 2020-10-10: 10 mL
  Filled 2020-10-10: qty 10

## 2020-10-10 MED ORDER — DIPHENHYDRAMINE HCL 50 MG/ML IJ SOLN
INTRAMUSCULAR | Status: AC
  Filled 2020-10-10: qty 1

## 2020-10-10 MED ORDER — DIPHENHYDRAMINE HCL 50 MG/ML IJ SOLN
25.0000 mg | Freq: Once | INTRAMUSCULAR | Status: AC
  Administered 2020-10-10: 25 mg via INTRAVENOUS

## 2020-10-10 MED ORDER — SODIUM CHLORIDE 0.9 % IV SOLN
Freq: Once | INTRAVENOUS | Status: AC
Start: 2020-10-10 — End: 2020-10-10
  Filled 2020-10-10: qty 250

## 2020-10-10 MED ORDER — SODIUM CHLORIDE 0.9 % IV SOLN
10.0000 mg | Freq: Once | INTRAVENOUS | Status: AC
  Administered 2020-10-10: 10 mg via INTRAVENOUS
  Filled 2020-10-10: qty 10

## 2020-10-10 MED ORDER — TRASTUZUMAB-ANNS CHEMO 150 MG IV SOLR
150.0000 mg | Freq: Once | INTRAVENOUS | Status: AC
  Administered 2020-10-10: 150 mg via INTRAVENOUS
  Filled 2020-10-10: qty 7.14

## 2020-10-10 NOTE — Progress Notes (Signed)
Per Dr. Alvy Bimler, ok to proceed with treatment with total bili of 1.6.

## 2020-10-10 NOTE — Patient Instructions (Signed)
[facility] Discharge Instructions for Patients Receiving Chemotherapy  Today you received the following chemotherapy agents: Trastuzumab, Paclitaxel  To help prevent nausea and vomiting after your treatment, we encourage you to take your nausea medication as directed.   If you develop nausea and vomiting that is not controlled by your nausea medication, call the clinic.   BELOW ARE SYMPTOMS THAT SHOULD BE REPORTED IMMEDIATELY:  *FEVER GREATER THAN 100.5 F  *CHILLS WITH OR WITHOUT FEVER  NAUSEA AND VOMITING THAT IS NOT CONTROLLED WITH YOUR NAUSEA MEDICATION  *UNUSUAL SHORTNESS OF BREATH  *UNUSUAL BRUISING OR BLEEDING  TENDERNESS IN MOUTH AND THROAT WITH OR WITHOUT PRESENCE OF ULCERS  *URINARY PROBLEMS  *BOWEL PROBLEMS  UNUSUAL RASH Items with * indicate a potential emergency and should be followed up as soon as possible.  Feel free to call the clinic should you have any questions or concerns. The clinic phone number is [phone].  Please show the [facility] at check-in to the Emergency Department and triage nurse.

## 2020-10-14 ENCOUNTER — Other Ambulatory Visit (HOSPITAL_COMMUNITY): Payer: Self-pay

## 2020-10-17 ENCOUNTER — Other Ambulatory Visit: Payer: Self-pay

## 2020-10-17 ENCOUNTER — Other Ambulatory Visit: Payer: 59

## 2020-10-17 ENCOUNTER — Inpatient Hospital Stay: Payer: 59

## 2020-10-17 ENCOUNTER — Ambulatory Visit (HOSPITAL_BASED_OUTPATIENT_CLINIC_OR_DEPARTMENT_OTHER): Payer: 59 | Admitting: Medical

## 2020-10-17 VITALS — BP 113/63 | HR 80 | Temp 98.5°F | Resp 18 | Wt 156.0 lb

## 2020-10-17 DIAGNOSIS — C50411 Malignant neoplasm of upper-outer quadrant of right female breast: Secondary | ICD-10-CM

## 2020-10-17 DIAGNOSIS — Z801 Family history of malignant neoplasm of trachea, bronchus and lung: Secondary | ICD-10-CM | POA: Diagnosis not present

## 2020-10-17 DIAGNOSIS — Z170 Estrogen receptor positive status [ER+]: Secondary | ICD-10-CM

## 2020-10-17 DIAGNOSIS — Z5112 Encounter for antineoplastic immunotherapy: Secondary | ICD-10-CM | POA: Diagnosis not present

## 2020-10-17 DIAGNOSIS — Z880 Allergy status to penicillin: Secondary | ICD-10-CM | POA: Diagnosis not present

## 2020-10-17 DIAGNOSIS — Z95828 Presence of other vascular implants and grafts: Secondary | ICD-10-CM

## 2020-10-17 LAB — CBC WITH DIFFERENTIAL (CANCER CENTER ONLY)
Abs Immature Granulocytes: 0.04 10*3/uL (ref 0.00–0.07)
Basophils Absolute: 0 10*3/uL (ref 0.0–0.1)
Basophils Relative: 0 %
Eosinophils Absolute: 0.1 10*3/uL (ref 0.0–0.5)
Eosinophils Relative: 1 %
HCT: 39.4 % (ref 36.0–46.0)
Hemoglobin: 12.8 g/dL (ref 12.0–15.0)
Immature Granulocytes: 1 %
Lymphocytes Relative: 30 %
Lymphs Abs: 1.8 10*3/uL (ref 0.7–4.0)
MCH: 27.7 pg (ref 26.0–34.0)
MCHC: 32.5 g/dL (ref 30.0–36.0)
MCV: 85.3 fL (ref 80.0–100.0)
Monocytes Absolute: 0.6 10*3/uL (ref 0.1–1.0)
Monocytes Relative: 10 %
Neutro Abs: 3.5 10*3/uL (ref 1.7–7.7)
Neutrophils Relative %: 58 %
Platelet Count: 284 10*3/uL (ref 150–400)
RBC: 4.62 MIL/uL (ref 3.87–5.11)
RDW: 17.1 % — ABNORMAL HIGH (ref 11.5–15.5)
WBC Count: 6.1 10*3/uL (ref 4.0–10.5)
nRBC: 0 % (ref 0.0–0.2)

## 2020-10-17 LAB — CMP (CANCER CENTER ONLY)
ALT: 24 U/L (ref 0–44)
AST: 27 U/L (ref 15–41)
Albumin: 3.7 g/dL (ref 3.5–5.0)
Alkaline Phosphatase: 69 U/L (ref 38–126)
Anion gap: 8 (ref 5–15)
BUN: 4 mg/dL — ABNORMAL LOW (ref 8–23)
CO2: 26 mmol/L (ref 22–32)
Calcium: 9.1 mg/dL (ref 8.9–10.3)
Chloride: 107 mmol/L (ref 98–111)
Creatinine: 0.66 mg/dL (ref 0.44–1.00)
GFR, Estimated: >60 mL/min (ref >60)
Glucose, Bld: 90 mg/dL (ref 70–99)
Potassium: 3.3 mmol/L — ABNORMAL LOW (ref 3.5–5.1)
Sodium: 141 mmol/L (ref 135–145)
Total Bilirubin: 1.8 mg/dL — ABNORMAL HIGH (ref 0.3–1.2)
Total Protein: 6.7 g/dL (ref 6.5–8.1)

## 2020-10-17 LAB — MAGNESIUM: Magnesium: 1.8 mg/dL (ref 1.7–2.4)

## 2020-10-17 MED ORDER — SODIUM CHLORIDE 0.9 % IV SOLN
Freq: Once | INTRAVENOUS | Status: AC
Start: 2020-10-17 — End: 2020-10-17
  Filled 2020-10-17: qty 250

## 2020-10-17 MED ORDER — SODIUM CHLORIDE 0.9 % IV SOLN
Freq: Once | INTRAVENOUS | Status: AC
  Filled 2020-10-17: qty 250

## 2020-10-17 MED ORDER — SODIUM CHLORIDE 0.9 % IV SOLN
65.0000 mg/m2 | Freq: Once | INTRAVENOUS | Status: AC
  Administered 2020-10-17: 120 mg via INTRAVENOUS
  Filled 2020-10-17: qty 20

## 2020-10-17 MED ORDER — SODIUM CHLORIDE 0.9% FLUSH
10.0000 mL | INTRAVENOUS | Status: DC | PRN
  Administered 2020-10-17: 10 mL
  Filled 2020-10-17: qty 10

## 2020-10-17 MED ORDER — FOSAPREPITANT DIMEGLUMINE INJECTION 150 MG
150.0000 mg | Freq: Once | INTRAVENOUS | Status: AC
Start: 2020-10-17 — End: 2020-10-17
  Administered 2020-10-17: 150 mg via INTRAVENOUS
  Filled 2020-10-17: qty 150

## 2020-10-17 MED ORDER — TRASTUZUMAB-ANNS CHEMO 150 MG IV SOLR
150.0000 mg | Freq: Once | INTRAVENOUS | Status: AC
  Administered 2020-10-17: 150 mg via INTRAVENOUS
  Filled 2020-10-17: qty 7.14

## 2020-10-17 MED ORDER — FAMOTIDINE IN NACL 20-0.9 MG/50ML-% IV SOLN
INTRAVENOUS | Status: AC
  Filled 2020-10-17: qty 50

## 2020-10-17 MED ORDER — DIPHENHYDRAMINE HCL 50 MG/ML IJ SOLN
INTRAMUSCULAR | Status: AC
  Filled 2020-10-17: qty 1

## 2020-10-17 MED ORDER — ACETAMINOPHEN 325 MG PO TABS
650.0000 mg | ORAL_TABLET | Freq: Once | ORAL | Status: AC
Start: 2020-10-17 — End: 2020-10-17
  Administered 2020-10-17: 650 mg via ORAL

## 2020-10-17 MED ORDER — ACETAMINOPHEN 325 MG PO TABS
ORAL_TABLET | ORAL | Status: AC
  Filled 2020-10-17: qty 2

## 2020-10-17 MED ORDER — SODIUM CHLORIDE 0.9% FLUSH
10.0000 mL | Freq: Once | INTRAVENOUS | Status: AC
  Administered 2020-10-17: 10 mL
  Filled 2020-10-17: qty 10

## 2020-10-17 MED ORDER — DEXAMETHASONE SODIUM PHOSPHATE 100 MG/10ML IJ SOLN
10.0000 mg | Freq: Once | INTRAMUSCULAR | Status: AC
  Administered 2020-10-17: 10 mg via INTRAVENOUS
  Filled 2020-10-17: qty 10

## 2020-10-17 MED ORDER — FAMOTIDINE IN NACL 20-0.9 MG/50ML-% IV SOLN
20.0000 mg | Freq: Once | INTRAVENOUS | Status: AC
  Administered 2020-10-17: 20 mg via INTRAVENOUS

## 2020-10-17 MED ORDER — DIPHENHYDRAMINE HCL 50 MG/ML IJ SOLN
25.0000 mg | Freq: Once | INTRAMUSCULAR | Status: AC
  Administered 2020-10-17: 25 mg via INTRAVENOUS

## 2020-10-17 MED ORDER — HEPARIN SOD (PORK) LOCK FLUSH 100 UNIT/ML IV SOLN
500.0000 [IU] | Freq: Once | INTRAVENOUS | Status: AC | PRN
Start: 2020-10-17 — End: 2020-10-17
  Administered 2020-10-17: 500 [IU]
  Filled 2020-10-17: qty 5

## 2020-10-17 NOTE — Patient Instructions (Signed)
Implanted Port Insertion, Care After This sheet gives you information about how to care for yourself after your procedure. Your health care provider may also give you more specific instructions. If you have problems or questions, contact your health care provider. What can I expect after the procedure? After the procedure, it is common to have:  Discomfort at the port insertion site.  Bruising on the skin over the port. This should improve over 3-4 days. Follow these instructions at home: Port care  After your port is placed, you will get a manufacturer's information card. The card has information about your port. Keep this card with you at all times.  Take care of the port as told by your health care provider. Ask your health care provider if you or a family member can get training for taking care of the port at home. A home health care nurse may also take care of the port.  Make sure to remember what type of port you have. Incision care  Follow instructions from your health care provider about how to take care of your port insertion site. Make sure you: ? Wash your hands with soap and water before and after you change your bandage (dressing). If soap and water are not available, use hand sanitizer. ? Change your dressing as told by your health care provider. ? Leave stitches (sutures), skin glue, or adhesive strips in place. These skin closures may need to stay in place for 2 weeks or longer. If adhesive strip edges start to loosen and curl up, you may trim the loose edges. Do not remove adhesive strips completely unless your health care provider tells you to do that.  Check your port insertion site every day for signs of infection. Check for: ? Redness, swelling, or pain. ? Fluid or blood. ? Warmth. ? Pus or a bad smell.      Activity  Return to your normal activities as told by your health care provider. Ask your health care provider what activities are safe for you.  Do not  lift anything that is heavier than 10 lb (4.5 kg), or the limit that you are told, until your health care provider says that it is safe. General instructions  Take over-the-counter and prescription medicines only as told by your health care provider.  Do not take baths, swim, or use a hot tub until your health care provider approves. Ask your health care provider if you may take showers. You may only be allowed to take sponge baths.  Do not drive for 24 hours if you were given a sedative during your procedure.  Wear a medical alert bracelet in case of an emergency. This will tell any health care providers that you have a port.  Keep all follow-up visits as told by your health care provider. This is important. Contact a health care provider if:  You cannot flush your port with saline as directed, or you cannot draw blood from the port.  You have a fever or chills.  You have redness, swelling, or pain around your port insertion site.  You have fluid or blood coming from your port insertion site.  Your port insertion site feels warm to the touch.  You have pus or a bad smell coming from the port insertion site. Get help right away if:  You have chest pain or shortness of breath.  You have bleeding from your port that you cannot control. Summary  Take care of the port as told by your   health care provider. Keep the manufacturer's information card with you at all times.  Change your dressing as told by your health care provider.  Contact a health care provider if you have a fever or chills or if you have redness, swelling, or pain around your port insertion site.  Keep all follow-up visits as told by your health care provider. This information is not intended to replace advice given to you by your health care provider. Make sure you discuss any questions you have with your health care provider. Document Revised: 01/10/2018 Document Reviewed: 01/10/2018 Elsevier Patient Education   2021 Elsevier Inc.  

## 2020-10-17 NOTE — Progress Notes (Signed)
Per Dr. Lorenso Courier, ok to proceed with elevated bili.

## 2020-10-17 NOTE — Patient Instructions (Signed)
[facility]ICAL [facility]COLOGY   Discharge Instructions: Thank you for choosing [facility] to provide your oncology and hematology care.   If you have a lab appointment with the Cancer Center, please go directly to the Cancer Center and check in at the registration area.   Wear comfortable clothing and clothing appropriate for easy access to any Portacath or PICC line.   We strive to give you quality time with your provider. You may need to reschedule your appointment if you arrive late (15 or more minutes).  Arriving late affects you and other patients whose appointments are after yours.  Also, if you miss three or more appointments without notifying the office, you may be dismissed from the clinic at the provider's discretion.      For prescription refill requests, have your pharmacy contact our office and allow 72 hours for refills to be completed.    Today you received the following chemotherapy and/or immunotherapy agents: trastuzumab and paclitaxel.      To help prevent nausea and vomiting after your treatment, we encourage you to take your nausea medication as directed.  BELOW ARE SYMPTOMS THAT SHOULD BE REPORTED IMMEDIATELY: *FEVER GREATER THAN 100.4 F (38 C) OR HIGHER *CHILLS OR SWEATING *NAUSEA AND VOMITING THAT IS NOT CONTROLLED WITH YOUR NAUSEA MEDICATION *UNUSUAL SHORTNESS OF BREATH *UNUSUAL BRUISING OR BLEEDING *URINARY PROBLEMS (pain or burning when urinating, or frequent urination) *BOWEL PROBLEMS (unusual diarrhea, constipation, pain near the anus) TENDERNESS IN MOUTH AND THROAT WITH OR WITHOUT PRESENCE OF ULCERS (sore throat, sores in mouth, or a toothache) UNUSUAL RASH, SWELLING OR PAIN  UNUSUAL VAGINAL DISCHARGE OR ITCHING   Items with * indicate a potential emergency and should be followed up as soon as possible or go to the Emergency Department if any problems should occur.  Please show the CHEMOTHERAPY ALERT CARD or IMMUNOTHERAPY ALERT  CARD at check-in to the Emergency Department and triage nurse.  Should you have questions after your visit or need to cancel or reschedule your appointment, please contact [facility]  Dept: [phone]  and follow the prompts.  Office hours are 8:00 a.m. to 4:30 p.m. Monday - Friday. Please note that voicemails left after 4:00 p.m. may not be returned until the following business day.  We are closed weekends and major holidays. You have access to a nurse at all times for urgent questions. Please call the main number to the clinic Dept: [phone] and follow the prompts.   For any non-urgent questions, you may also contact your provider using MyChart. We now offer e-Visits for anyone 18 and older to request care online for non-urgent symptoms. For details visit [email or web address].   Also download the MyChart app! Go to the app store, search "MyChart", open the app, select [facility], and log in with your MyChart username and password.  Due to Covid, a mask is required upon entering the hospital/clinic. If you do not have a mask, one will be given to you upon arrival. For doctor visits, patients may have 1 support person aged 18 or older with them. For treatment visits, patients cannot have anyone with them due to current Covid guidelines and our immunocompromised population.   

## 2020-10-17 NOTE — Progress Notes (Signed)
The patient reported that they were having diarrhea and nausea.  Her labs were reviewed.  Her potassium was high at 3.3.  Her bilirubin remains slightly elevated at 1.8 today.  Her CBC is stable.  She continues on potassium chloride 20 mEq daily.  The patient was to be seen after she completed chemotherapy today however she left without being seen.  Sandi Mealy, MHS, PA-C Physician Assistant

## 2020-10-20 ENCOUNTER — Telehealth: Payer: Self-pay | Admitting: *Deleted

## 2020-10-20 NOTE — Telephone Encounter (Signed)
Received VM from pt.  Attempt x1 to return call, no answer.  LVM to return call to the office.

## 2020-10-20 NOTE — Telephone Encounter (Signed)
Received VM from pt.  Attempt x1 to return call.  No answer, LVM to return call to the office.  

## 2020-10-21 ENCOUNTER — Other Ambulatory Visit (HOSPITAL_COMMUNITY): Payer: Self-pay

## 2020-10-21 NOTE — Progress Notes (Signed)
[facility] OFFICE PROGRESS NOTE  Shon Baton, MD [place] [address]  DIAGNOSIS: Stage IB (cT2, cN0, cM0, G3, ER+, PR+, HER2+) Right sided breast cancer  Oncology History  Malignant neoplasm of upper-outer quadrant of right breast in female, estrogen receptor positive ([place])  08/08/2020 Initial Diagnosis   Screening detected right breast mass 2.2 cm by ultrasound upper outer quadrant biopsy: Grade 2-3 IDC with DCIS ER 60%, PR 20%, Ki-67 15%, HER-2 +3+   08/15/2020 Cancer Staging   Staging form: Breast, AJCC 8th Edition - Clinical stage from 08/15/2020: Stage IB (cT2, cN0, cM0, G3, ER+, PR+, HER2+) - Signed by Nicholas Lose, MD on 08/15/2020 Stage prefix: Initial diagnosis   08/27/2020 -  Chemotherapy    Patient is on Treatment Plan: BREAST PACLITAXEL + TRASTUZUMAB Q7D / TRASTUZUMAB Q21D         CURRENT THERAPY: Week 9 of taxol and herceptin  INTERVAL HISTORY: Rachel Gaines 65 y.o. female returns to the clinic today for a follow-up visit. The patient is feeling fair today without any concerning complaints except for continued diarrhea. She reports diarrhea 3-4x per day from last Saturday to Mon/Tues. She has been taking lomotil. Does not sound like she is using imodium consistently to help achieve better control. She sometimes has left sided abdominal cramping but none at this time. No blood in the stool. The diarrhea and nausea have affected her appetite. She is scheduled to see nutrition while in the clinic today. Her weight is stable. She continues to take potassium supplements daily. Today she denies any fever, chills, or night sweats.  She denies any shortness of breath, cough, hemoptysis, or chest pain. In the interval since last week, she developed peripheral neuropathy in there fingertips bilaterally. She is still able to have a normal functional status with her hands and is able to write, button clothing, etc. The numbness is consistent. She also  reports nausea and vomiting on Saturday/sunday. She had 3 episodes of emesis on Saturday and 2 on Sunday. She took zofran but not compazine. She is here for evaluation and repeat blood work before starting week 9 of treatment.     MEDICAL HISTORY: Past Medical History:  Diagnosis Date  . Abnormal Pap smear of cervix 2014   ASCUS with negative HR HPV   . Anemia   . Cancer [facility]) 06/2020   right breast IDC with DCIS  . Cataract   . Diabetes mellitus without complication ([place])   . Hyperlipidemia    on medicine  . Plantar fasciitis   . 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  MEDICATIONS:  Current Outpatient Medications  Medication Sig Dispense Refill  . albuterol (VENTOLIN HFA) 108 (90 Base) MCG/ACT inhaler INHALE 1 TO 2 PUFFS BY MOUTH INTO THE LUNGS EVERY 4 TO 6 HOURS AS NEEDED FOR SHORTNESS OF BREATH/WHEEZING/COUGH URGENCY 18 g 0  . aspirin EC 81 MG tablet Take 81 mg by mouth daily.    . benzonatate (TESSALON) 200 MG capsule TAKE 1 CAPSULE BY MOUTH 3 TIMES DAILY AS NEEDED 30 capsule 0  . Biotin 5 MG CAPS Take 1 capsule by mouth daily.    . Cholecalciferol (VITAMIN D3) 125 MCG (5000 UT) CAPS 1 capsule    . Continuous Blood Gluc Sensor (FREESTYLE LIBRE 2 SENSOR) MISC Change every 14 days to monitor blood glucose continuosly    . diclofenac Sodium (VOLTAREN) 1 % GEL SMARTSIG:2 Gram(s)  Topical 1 to 4 Times Daily PRN    . diphenoxylate-atropine (LOMOTIL) 2.5-0.025 MG tablet TAKE 1 TABLET BY MOUTH 4 (FOUR) TIMES DAILY AS NEEDED FOR DIARRHEA OR LOOSE STOOLS. 30 tablet 0  . empagliflozin (JARDIANCE) 10 MG TABS tablet TAKE 1 TABLET BY MOUTH ONCE A DAY 30 tablet 3  . FREESTYLE LITE test strip     . gabapentin (NEURONTIN) 300 MG capsule Take 300 mg by mouth at bedtime. Only takes at night    . HUMALOG KWIKPEN 100 UNIT/ML KwikPen 10 Units 3 (three) times daily.    . insulin detemir (LEVEMIR) 100 UNIT/ML FlexPen INJECT 34 UNITS  SUBCUTANEOUSLY TWICE DAILY 15 mL 3  . Insulin Glargine (BASAGLAR KWIKPEN) 100 UNIT/ML INJECT 34 UNITS UNDER THE SKIN TWICE DAILY 45 mL 3  . Insulin Glargine-yfgn 100 UNIT/ML SOPN INJECT 34 UNITS SUBCUTANEOUSLY TWICE DAILY 15 mL 3  . insulin lispro (HUMALOG) 100 UNIT/ML KwikPen Inject 14 Units into the skin 3 (three) times daily with meals. 45 mL 3  . LANTUS SOLOSTAR 100 UNIT/ML Solostar Pen Inject 34 Units into the skin 2 (two) times daily. If CBG <80 in AM, take only 20 units.   1  . lidocaine-prilocaine (EMLA) cream APPLY 1 APPLICATION TOPICALLY AS NEEDED. 30 g 0  . NONFORMULARY OR COMPOUNDED ITEM [facility]:  Antiinflammatory cream - Diclofenac 3%, Baclofen 2%, Cyclobenzaprine 2%, Lidocaine 2%, dispense 120grams, apply 1-2 grams to affected area 3-4 times a day, +2refills. 120 each 2  . ondansetron (ZOFRAN) 8 MG tablet TAKE 1 TABLET (8 MG TOTAL) BY MOUTH EVERY 8 (EIGHT) HOURS AS NEEDED FOR NAUSEA OR VOMITING. 30 tablet 0  . potassium chloride SA (KLOR-CON) 20 MEQ tablet TAKE 1 TABLET (20 MEQ TOTAL) BY MOUTH DAILY. 30 tablet 0  . prochlorperazine (COMPAZINE) 10 MG tablet TAKE 1 TABLET (10 MG TOTAL) BY MOUTH EVERY 6 (SIX) HOURS AS NEEDED FOR NAUSEA OR VOMITING. 30 tablet 0  . ramipril (ALTACE) 2.5 MG capsule TAKE 1 CAPSULE BY MOUTH EVERY OTHER DAY 45 capsule 3  . rosuvastatin (CRESTOR) 40 MG tablet TAKE 1 TABLET BY MOUTH ONCE DAILY 90 tablet 2  . sitaGLIPtin (JANUVIA) 100 MG tablet TAKE 1 TABLET BY MOUTH ONCE DAILY 30 tablet 5  . traMADol (ULTRAM) 50 MG tablet TAKE 1 TABLET (50 MG TOTAL) BY MOUTH EVERY 6 (SIX) HOURS AS NEEDED. 10 tablet 0  . UNIFINE PENTIPS 31G X 8 MM MISC USE WITH RX OF LEVEMIR AND HUMALOG FOUR TIMES DAILY.    Marland Kitchen UNIFINE PENTIPS 32G X 4 MM MISC USE WITH LEVEMIR AND HUMALOG 4X A DAY [id number]    . valACYclovir (VALTREX) 500 MG tablet Take 1 tablet (500 mg total) by mouth 2 (two) times daily. Take for 3 days for outbreak 30 tablet 2  . Pancrelipase, Lip-Prot-Amyl, (ZENPEP)  40000-126000 units CPEP Take 1 capsule by mouth in the morning and at bedtime. 120 capsule   . Pancrelipase, Lip-Prot-Amyl, 40000-126000 units CPEP TAKE 2 CAPSULES BY MOUTH WITH MEALS AND 1 CAPSULE WITH SNACKS 240 capsule 12   No current facility-administered medications for this visit.    SURGICAL HISTORY:  Past Surgical History:  Procedure Laterality Date  . CATARACT EXTRACTION  2019  . COLONOSCOPY    . COLPOSCOPY W/ BIOPSY / CURETTAGE  02/04/1999   Chronic cervicitis  . EYE SURGERY    . PORTACATH PLACEMENT N/A 08/26/2020   Procedure: INSERTION PORT-A-CATH;  Surgeon: Rolm Bookbinder, MD;  Location: [facility];  Service: General;  Laterality:  N/A;  START TIME OF 12:30 PM FOR 60 MINUTES ROOM 8    REVIEW OF SYSTEMS:   Review of Systems  Constitutional: Positive for appetite change. Negative for chills, fatigue, fever and unexpected weight change.  HENT: Negative for mouth sores, nosebleeds, sore throat and trouble swallowing.   Eyes: Negative for eye problems and icterus.  Respiratory: Negative for cough, hemoptysis, shortness of breath and wheezing.   Cardiovascular: Negative for chest pain and leg swelling.  Gastrointestinal: Positive for diarrhea (resolved at this time) positive for n/v after treatment. Negative for abdominal pain and constipation. Genitourinary: Negative for bladder incontinence, difficulty urinating, dysuria, frequency and hematuria.   Musculoskeletal: Negative for back pain, gait problem, neck pain and neck stiffness.  Skin: Negative for itching and rash.  Neurological: Positive for peripheral neuropathy bilaterally in the fingertips. Negative for dizziness, extremity weakness, gait problem, headaches, light-headedness and seizures.  Hematological: Negative for adenopathy. Does not bruise/bleed easily.  Psychiatric/Behavioral: Negative for confusion, depression and sleep disturbance. The patient is not nervous/anxious.     PHYSICAL EXAMINATION:   Blood pressure 131/66, pulse 71, temperature (!) 97.4 F (36.3 C), temperature source Tympanic, resp. rate 18, weight 159 lb 8 oz (72.3 kg), last menstrual period 06/28/2006, SpO2 100 %.  ECOG PERFORMANCE STATUS: 1 - Symptomatic but completely ambulatory  Physical Exam  Constitutional: Oriented to person, place, and time and well-developed, well-nourished, and in no distress.  HENT:  Head: Normocephalic and atraumatic.  Mouth/Throat: Oropharynx is clear and moist. No oropharyngeal exudate.  Eyes: Conjunctivae are normal. Right eye exhibits no discharge. Left eye exhibits no discharge. No scleral icterus.  Neck: Normal range of motion. Neck supple.  Cardiovascular: Normal rate, regular rhythm, normal heart sounds and intact distal pulses.   Pulmonary/Chest: Effort normal and breath sounds normal. No respiratory distress. No wheezes. No rales.  Abdominal: Soft. Bowel sounds are normal. Exhibits no distension and no mass. There is no tenderness.  Musculoskeletal: Normal range of motion. Exhibits no edema.  Lymphadenopathy:    No cervical adenopathy.  Neurological: Alert and oriented to person, place, and time. Exhibits normal muscle tone. Gait normal. Coordination normal.  Skin: Skin is warm and dry. No rash noted. Not diaphoretic. No erythema. No pallor.  Psychiatric: Mood, memory and judgment normal.  Breast: No palpable lesions.  Vitals reviewed.  LABORATORY DATA: Lab Results  Component Value Date   WBC 5.4 10/24/2020   HGB 11.9 (L) 10/24/2020   HCT 36.2 10/24/2020   MCV 84.0 10/24/2020   PLT 257 10/24/2020      Chemistry      Component Value Date/Time   NA 142 10/24/2020 0957   NA 142 06/18/2019 0956   K 3.2 (L) 10/24/2020 0957   CL 106 10/24/2020 0957   CO2 28 10/24/2020 0957   BUN 5 (L) 10/24/2020 0957   BUN 10 06/18/2019 0956   CREATININE 0.61 10/24/2020 0957      Component Value Date/Time   CALCIUM 9.2 10/24/2020 0957   ALKPHOS 72 10/24/2020 0957   AST 31  10/24/2020 0957   ALT 19 10/24/2020 0957   BILITOT 1.0 10/24/2020 0957       RADIOGRAPHIC STUDIES:  No results found.   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 Diarrhea: Not well controlled. Reviewed to use imodium and lomotil for better control of her diarrhea. I refilled her lomotil today. Dose of taxol reduced to 50 mg/m2.  2. Nausea/vomiting: She only has been using zofran. Reviewed a schedule with her how to alternate between zofran and compazine for optimal control of nausea/vomiting.  3. Hypokalemia-likely secondary to diarrhea.  On oral potassium 20 meq daily. She also has a handout of potassium rich food and was encouraged to increase her potassium in her diet as well.  4. Decreased appetite/weight loss-Will follow up with nutrition. Scheduled to see nutrition today. 5. 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 Discussed with Dr. Lindi Adie who will further reduce her taxol to 50 mg/m2.  She is still working full-time and is taking care of patients.  Discussed with Dr. Lindi Adie who will reduce her dose of Taxol to 50 mg/m2 given her peripheral neuropathy and diarrhea. He will see her next week for a follow up and to make any arrangements with future imaging and coordination with surgery. She was also given advanced directive paperwork today  at her request.   Return to clinic weekly for chemo next week and follow up  The patient was advised to call immediately if she has any concerning symptoms in the interval. The patient voices understanding of current disease status and treatment options and is in agreement with the current care plan. All questions were answered. The patient knows to call the clinic with any problems, questions or concerns. We can certainly see the patient much sooner if necessary         No orders of the defined types were placed in this encounter.    I spent 30-39 minutes in this encounter  Adarius Tigges L Janari Gagner, PA-C 10/24/20

## 2020-10-24 ENCOUNTER — Inpatient Hospital Stay: Payer: 59 | Admitting: Dietician

## 2020-10-24 ENCOUNTER — Inpatient Hospital Stay: Payer: 59

## 2020-10-24 ENCOUNTER — Other Ambulatory Visit: Payer: Self-pay | Admitting: Physician Assistant

## 2020-10-24 ENCOUNTER — Other Ambulatory Visit: Payer: 59

## 2020-10-24 ENCOUNTER — Other Ambulatory Visit (HOSPITAL_COMMUNITY): Payer: Self-pay

## 2020-10-24 ENCOUNTER — Other Ambulatory Visit: Payer: Self-pay | Admitting: Hematology and Oncology

## 2020-10-24 ENCOUNTER — Encounter: Payer: Self-pay | Admitting: Physician Assistant

## 2020-10-24 ENCOUNTER — Other Ambulatory Visit (HOSPITAL_COMMUNITY): Payer: Self-pay | Admitting: Internal Medicine

## 2020-10-24 ENCOUNTER — Inpatient Hospital Stay: Payer: 59 | Admitting: Physician Assistant

## 2020-10-24 ENCOUNTER — Encounter: Payer: Self-pay | Admitting: *Deleted

## 2020-10-24 ENCOUNTER — Other Ambulatory Visit: Payer: Self-pay

## 2020-10-24 VITALS — BP 131/66 | HR 71 | Temp 97.4°F | Resp 18 | Wt 159.5 lb

## 2020-10-24 DIAGNOSIS — R197 Diarrhea, unspecified: Secondary | ICD-10-CM | POA: Diagnosis not present

## 2020-10-24 DIAGNOSIS — Z801 Family history of malignant neoplasm of trachea, bronchus and lung: Secondary | ICD-10-CM | POA: Diagnosis not present

## 2020-10-24 DIAGNOSIS — C50411 Malignant neoplasm of upper-outer quadrant of right female breast: Secondary | ICD-10-CM | POA: Diagnosis not present

## 2020-10-24 DIAGNOSIS — Z880 Allergy status to penicillin: Secondary | ICD-10-CM | POA: Diagnosis not present

## 2020-10-24 DIAGNOSIS — Z170 Estrogen receptor positive status [ER+]: Secondary | ICD-10-CM

## 2020-10-24 DIAGNOSIS — Z5112 Encounter for antineoplastic immunotherapy: Secondary | ICD-10-CM | POA: Diagnosis not present

## 2020-10-24 LAB — CBC WITH DIFFERENTIAL (CANCER CENTER ONLY)
Abs Immature Granulocytes: 0.05 10*3/uL (ref 0.00–0.07)
Basophils Absolute: 0 10*3/uL (ref 0.0–0.1)
Basophils Relative: 1 %
Eosinophils Absolute: 0.1 10*3/uL (ref 0.0–0.5)
Eosinophils Relative: 2 %
HCT: 36.2 % (ref 36.0–46.0)
Hemoglobin: 11.9 g/dL — ABNORMAL LOW (ref 12.0–15.0)
Immature Granulocytes: 1 %
Lymphocytes Relative: 30 %
Lymphs Abs: 1.6 10*3/uL (ref 0.7–4.0)
MCH: 27.6 pg (ref 26.0–34.0)
MCHC: 32.9 g/dL (ref 30.0–36.0)
MCV: 84 fL (ref 80.0–100.0)
Monocytes Absolute: 0.6 10*3/uL (ref 0.1–1.0)
Monocytes Relative: 11 %
Neutro Abs: 3.1 10*3/uL (ref 1.7–7.7)
Neutrophils Relative %: 55 %
Platelet Count: 257 10*3/uL (ref 150–400)
RBC: 4.31 MIL/uL (ref 3.87–5.11)
RDW: 16.7 % — ABNORMAL HIGH (ref 11.5–15.5)
WBC Count: 5.4 10*3/uL (ref 4.0–10.5)
nRBC: 0 % (ref 0.0–0.2)

## 2020-10-24 LAB — CMP (CANCER CENTER ONLY)
ALT: 19 U/L (ref 0–44)
AST: 31 U/L (ref 15–41)
Albumin: 3.5 g/dL (ref 3.5–5.0)
Alkaline Phosphatase: 72 U/L (ref 38–126)
Anion gap: 8 (ref 5–15)
BUN: 5 mg/dL — ABNORMAL LOW (ref 8–23)
CO2: 28 mmol/L (ref 22–32)
Calcium: 9.2 mg/dL (ref 8.9–10.3)
Chloride: 106 mmol/L (ref 98–111)
Creatinine: 0.61 mg/dL (ref 0.44–1.00)
GFR, Estimated: >60 mL/min (ref >60)
Glucose, Bld: 76 mg/dL (ref 70–99)
Potassium: 3.2 mmol/L — ABNORMAL LOW (ref 3.5–5.1)
Sodium: 142 mmol/L (ref 135–145)
Total Bilirubin: 1 mg/dL (ref 0.3–1.2)
Total Protein: 6.4 g/dL — ABNORMAL LOW (ref 6.5–8.1)

## 2020-10-24 MED ORDER — FAMOTIDINE IN NACL 20-0.9 MG/50ML-% IV SOLN
20.0000 mg | Freq: Once | INTRAVENOUS | Status: AC
  Administered 2020-10-24: 20 mg via INTRAVENOUS

## 2020-10-24 MED ORDER — DIPHENHYDRAMINE HCL 50 MG/ML IJ SOLN
INTRAMUSCULAR | Status: AC
  Filled 2020-10-24: qty 1

## 2020-10-24 MED ORDER — INSULIN LISPRO (1 UNIT DIAL) 100 UNIT/ML (KWIKPEN)
14.0000 [IU] | PEN_INJECTOR | Freq: Three times a day (TID) | SUBCUTANEOUS | 3 refills | Status: DC
  Filled 2020-10-24: qty 36, 86d supply, fill #0
  Filled 2021-07-28: qty 36, 86d supply, fill #1

## 2020-10-24 MED ORDER — HEPARIN SOD (PORK) LOCK FLUSH 100 UNIT/ML IV SOLN
500.0000 [IU] | Freq: Once | INTRAVENOUS | Status: AC | PRN
  Administered 2020-10-24: 500 [IU]
  Filled 2020-10-24: qty 5

## 2020-10-24 MED ORDER — SODIUM CHLORIDE 0.9 % IV SOLN
50.0000 mg/m2 | Freq: Once | INTRAVENOUS | Status: AC
  Administered 2020-10-24: 90 mg via INTRAVENOUS
  Filled 2020-10-24: qty 15

## 2020-10-24 MED ORDER — ACETAMINOPHEN 325 MG PO TABS
ORAL_TABLET | ORAL | Status: AC
  Filled 2020-10-24: qty 2

## 2020-10-24 MED ORDER — DIPHENOXYLATE-ATROPINE 2.5-0.025 MG PO TABS
ORAL_TABLET | ORAL | 0 refills | Status: DC
  Filled 2020-10-24: qty 30, 7d supply, fill #0

## 2020-10-24 MED ORDER — SODIUM CHLORIDE 0.9% FLUSH
10.0000 mL | INTRAVENOUS | Status: DC | PRN
  Administered 2020-10-24: 10 mL
  Filled 2020-10-24: qty 10

## 2020-10-24 MED ORDER — FAMOTIDINE IN NACL 20-0.9 MG/50ML-% IV SOLN
INTRAVENOUS | Status: AC
  Filled 2020-10-24: qty 50

## 2020-10-24 MED ORDER — SODIUM CHLORIDE 0.9 % IV SOLN
10.0000 mg | Freq: Once | INTRAVENOUS | Status: AC
  Administered 2020-10-24: 10 mg via INTRAVENOUS
  Filled 2020-10-24: qty 10

## 2020-10-24 MED ORDER — ACETAMINOPHEN 325 MG PO TABS
650.0000 mg | ORAL_TABLET | Freq: Once | ORAL | Status: AC
  Administered 2020-10-24: 650 mg via ORAL

## 2020-10-24 MED ORDER — DIPHENHYDRAMINE HCL 50 MG/ML IJ SOLN
25.0000 mg | Freq: Once | INTRAMUSCULAR | Status: AC
  Administered 2020-10-24: 25 mg via INTRAVENOUS

## 2020-10-24 MED ORDER — SODIUM CHLORIDE 0.9 % IV SOLN
Freq: Once | INTRAVENOUS | Status: AC
Start: 2020-10-24 — End: 2020-10-24
  Filled 2020-10-24: qty 250

## 2020-10-24 MED ORDER — TRASTUZUMAB-ANNS CHEMO 150 MG IV SOLR
150.0000 mg | Freq: Once | INTRAVENOUS | Status: AC
  Administered 2020-10-24: 150 mg via INTRAVENOUS
  Filled 2020-10-24: qty 7.14

## 2020-10-24 MED FILL — Continuous Glucose System Sensor: 28 days supply | Qty: 2 | Fill #1 | Status: CN

## 2020-10-24 NOTE — Patient Instructions (Signed)

## 2020-10-24 NOTE — Progress Notes (Signed)
Nutrition Follow-up:  Patient with breast cancer. She is receiving neoadjuvant Taxol and Herceptin  Met with patient in blue room prior to infusion. She reports some improvement to diarrhea with PERT. Patient reports sometimes she has forgotten to take it until after she has eaten. Patient reports eating tomato sandwich and baked chicken yesterday. She reports ongoing "off taste" has to "have a taste for something to eat it" She did not like [employer] or Ensure Clear samples. Patient continues to limit intake while at work due to diarrhea, she usually eats a few crackers. Peanut butter does not taste good, she does like cheese. Patient is hopeful of being able to work tomorrow, reports nausea/vomiting after last chemotherapy and unable to work.    Medications: reviewed  Labs: K 3.2, BUN 5  Anthropometrics: Weight 159 lb 8 oz increased from 156 lb on 4/22   NUTRITION DIAGNOSIS: Unintentional weight loss improved   INTERVENTION:  Reviewed dosage and when to take pancreatic enzymes Discussed strategies to increase intake on work days Reviewed strategies for managing diarrhea Discussed strategies for nausea, handout provided Encouraged small frequent meals, eating every 3 hours Reviewed soft/moist high protein foods Continue nausea medications Continue Imodium and Lomotil as needed for diarrhea Boost Breeze and Boost Soothe samples given     MONITORING, EVALUATION, GOAL: weight trends, intake   NEXT VISIT: Friday May 13

## 2020-10-24 NOTE — Patient Instructions (Signed)
[facility] ONCOLOGY  Discharge Instructions: Thank you for choosing [facility] to provide your oncology and hematology care.   If you have a lab appointment with the [facility], please go directly to the [facility] and check in at the registration area.   Wear comfortable clothing and clothing appropriate for easy access to any Portacath or PICC line.   We strive to give you quality time with your provider. You may need to reschedule your appointment if you arrive late (15 or more minutes).  Arriving late affects you and other patients whose appointments are after yours.  Also, if you miss three or more appointments without notifying the office, you may be dismissed from the clinic at the provider's discretion.      For prescription refill requests, have your pharmacy contact our office and allow 72 hours for refills to be completed.    Today you received the following chemotherapy and/or immunotherapy agents: kanjinti, taxol      To help prevent nausea and vomiting after your treatment, we encourage you to take your nausea medication as directed.  BELOW ARE SYMPTOMS THAT SHOULD BE REPORTED IMMEDIATELY: . *FEVER GREATER THAN 100.4 F (38 C) OR HIGHER . *CHILLS OR SWEATING . *NAUSEA AND VOMITING THAT IS NOT CONTROLLED WITH YOUR NAUSEA MEDICATION . *UNUSUAL SHORTNESS OF BREATH . *UNUSUAL BRUISING OR BLEEDING . *URINARY PROBLEMS (pain or burning when urinating, or frequent urination) . *BOWEL PROBLEMS (unusual diarrhea, constipation, pain near the anus) . TENDERNESS IN MOUTH AND THROAT WITH OR WITHOUT PRESENCE OF ULCERS (sore throat, sores in mouth, or a toothache) . UNUSUAL RASH, SWELLING OR PAIN  . UNUSUAL VAGINAL DISCHARGE OR ITCHING   Items with * indicate a potential emergency and should be followed up as soon as possible or go to the Emergency Department if any problems should occur.  Please show the CHEMOTHERAPY ALERT CARD or IMMUNOTHERAPY  ALERT CARD at check-in to the Emergency Department and triage nurse.  Should you have questions after your visit or need to cancel or reschedule your appointment, please contact [facility]  Dept: [phone]  and follow the prompts.  Office hours are 8:00 a.m. to 4:30 p.m. Monday - Friday. Please note that voicemails left after 4:00 p.m. may not be returned until the following business day.  We are closed weekends and major holidays. You have access to a nurse at all times for urgent questions. Please call the main number to the clinic Dept: [phone] and follow the prompts.   For any non-urgent questions, you may also contact your provider using MyChart. We now offer e-Visits for anyone 27 and older to request care online for non-urgent symptoms. For details visit [email or web address].   Also download the MyChart app! Go to the app store, search "MyChart", open the app, select [facility], and log in with your MyChart username and password.  Due to Covid, a mask is required upon entering the hospital/clinic. If you do not have a mask, one will be given to you upon arrival. For doctor visits, patients may have 1 support person aged 29 or older with them. For treatment visits, patients cannot have anyone with them due to current Covid guidelines and our immunocompromised population.

## 2020-10-24 NOTE — Progress Notes (Signed)
Because of worsening peripheral neuropathy, we reduced the dosage of Taxol to 50 mg per metered square.

## 2020-10-27 ENCOUNTER — Other Ambulatory Visit (HOSPITAL_COMMUNITY): Payer: Self-pay

## 2020-10-28 ENCOUNTER — Other Ambulatory Visit (HOSPITAL_COMMUNITY): Payer: Self-pay

## 2020-10-28 MED ORDER — FREESTYLE LIBRE 2 SENSOR MISC
3 refills | Status: DC
  Filled 2020-10-28: qty 6, 84d supply, fill #0

## 2020-10-29 ENCOUNTER — Other Ambulatory Visit (HOSPITAL_COMMUNITY): Payer: Self-pay

## 2020-10-30 ENCOUNTER — Other Ambulatory Visit (HOSPITAL_COMMUNITY): Payer: Self-pay

## 2020-10-30 NOTE — Assessment & Plan Note (Signed)
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 Diarrhea: She has diarrhea every other day in spite of Lomotil.  I will reduce the dosage of Taxol for this reason.. 2. Mild fatigue 3. Hypokalemia-likely secondary to diarrhea.  On oral potassium 20 meq daily  4. Decreased appetite/weight loss-Will follow up with nutrition. Encouraged diabetic supplemental protein drinks. She is still working full-time and is taking care of patients. 5. Peripheral Neuropathy: taxol dose reduced.  Return to clinic weekly for chemo and every other week for follow-up with [place]

## 2020-10-30 NOTE — Progress Notes (Signed)
Patient Care Team: Shon Baton, MD as PCP - General (Internal Medicine) Buford Dresser, MD as PCP - Cardiology (Cardiology) Mauro Kaufmann, RN as Oncology Nurse Navigator Rockwell Germany, RN as Oncology Nurse Navigator  DIAGNOSIS:    ICD-10-CM   1. Malignant neoplasm of upper-outer quadrant of right breast in female, estrogen receptor positive ([place])  C50.411    Z17.0     SUMMARY OF ONCOLOGIC HISTORY: Oncology History  Malignant neoplasm of upper-outer quadrant of right breast in female, estrogen receptor positive ([place])  08/08/2020 Initial Diagnosis   Screening detected right breast mass 2.2 cm by ultrasound upper outer quadrant biopsy: Grade 2-3 IDC with DCIS ER 60%, PR 20%, Ki-67 15%, HER-2 +3+   08/15/2020 Cancer Staging   Staging form: Breast, AJCC 8th Edition - Clinical stage from 08/15/2020: Stage IB (cT2, cN0, cM0, G3, ER+, PR+, HER2+) - Signed by Nicholas Lose, MD on 08/15/2020 Stage prefix: Initial diagnosis   08/27/2020 -  Chemotherapy    Patient is on Treatment Plan: BREAST PACLITAXEL + TRASTUZUMAB Q7D / TRASTUZUMAB Q21D        CHIEF COMPLIANT: Cycle10Taxol Herceptin  INTERVAL HISTORY: Rachel Gaines is a 65 y.o. with above-mentioned history of right breast cancer currently on neoadjuvant chemotherapy withTaxol Herceptin.She presents to the clinic today for cycle10.  ALLERGIES:  is allergic to penicillins.  MEDICATIONS:  Current Outpatient Medications  Medication Sig Dispense Refill  . albuterol (VENTOLIN HFA) 108 (90 Base) MCG/ACT inhaler INHALE 1 TO 2 PUFFS BY MOUTH INTO THE LUNGS EVERY 4 TO 6 HOURS AS NEEDED FOR SHORTNESS OF BREATH/WHEEZING/COUGH URGENCY 18 g 0  . aspirin EC 81 MG tablet Take 81 mg by mouth daily.    . benzonatate (TESSALON) 200 MG capsule TAKE 1 CAPSULE BY MOUTH 3 TIMES DAILY AS NEEDED 30 capsule 0  . Biotin 5 MG CAPS Take 1 capsule by mouth daily.    . Cholecalciferol (VITAMIN D3) 125 MCG (5000 UT) CAPS 1 capsule    . Continuous  Blood Gluc Sensor (FREESTYLE LIBRE 2 SENSOR) MISC Change every 14 days to monitor blood glucose continuosly    . Continuous Blood Gluc Sensor (FREESTYLE LIBRE 2 SENSOR) MISC Change every 14 days to monitor blood sugar as directed 9 each 3  . diclofenac Sodium (VOLTAREN) 1 % GEL SMARTSIG:2 Gram(s) Topical 1 to 4 Times Daily PRN    . diphenoxylate-atropine (LOMOTIL) 2.5-0.025 MG tablet TAKE 1 TABLET BY MOUTH 4 (FOUR) TIMES DAILY AS NEEDED FOR DIARRHEA OR LOOSE STOOLS. 30 tablet 0  . empagliflozin (JARDIANCE) 10 MG TABS tablet TAKE 1 TABLET BY MOUTH ONCE A DAY 30 tablet 3  . FREESTYLE LITE test strip     . gabapentin (NEURONTIN) 300 MG capsule Take 300 mg by mouth at bedtime. Only takes at night    . HUMALOG KWIKPEN 100 UNIT/ML KwikPen 10 Units 3 (three) times daily.    . insulin detemir (LEVEMIR) 100 UNIT/ML FlexPen INJECT 34 UNITS SUBCUTANEOUSLY TWICE DAILY 15 mL 3  . Insulin Glargine (BASAGLAR KWIKPEN) 100 UNIT/ML INJECT 34 UNITS UNDER THE SKIN TWICE DAILY 45 mL 3  . Insulin Glargine-yfgn 100 UNIT/ML SOPN INJECT 34 UNITS SUBCUTANEOUSLY TWICE DAILY 15 mL 3  . insulin lispro (HUMALOG) 100 UNIT/ML KwikPen Inject 14 Units into the skin 3 (three) times daily with meals. 45 mL 3  . LANTUS SOLOSTAR 100 UNIT/ML Solostar Pen Inject 34 Units into the skin 2 (two) times daily. If CBG <80 in AM, take only 20 units.   1  .  lidocaine-prilocaine (EMLA) cream APPLY 1 APPLICATION TOPICALLY AS NEEDED. 30 g 0  . NONFORMULARY OR COMPOUNDED ITEM [facility]:  Antiinflammatory cream - Diclofenac 3%, Baclofen 2%, Cyclobenzaprine 2%, Lidocaine 2%, dispense 120grams, apply 1-2 grams to affected area 3-4 times a day, +2refills. 120 each 2  . ondansetron (ZOFRAN) 8 MG tablet TAKE 1 TABLET (8 MG TOTAL) BY MOUTH EVERY 8 (EIGHT) HOURS AS NEEDED FOR NAUSEA OR VOMITING. 30 tablet 0  . Pancrelipase, Lip-Prot-Amyl, (ZENPEP) 40000-126000 units CPEP Take 1 capsule by mouth in the morning and at bedtime. 120 capsule   .  Pancrelipase, Lip-Prot-Amyl, 40000-126000 units CPEP TAKE 2 CAPSULES BY MOUTH WITH MEALS AND 1 CAPSULE WITH SNACKS 240 capsule 12  . potassium chloride SA (KLOR-CON) 20 MEQ tablet TAKE 1 TABLET (20 MEQ TOTAL) BY MOUTH DAILY. 30 tablet 0  . prochlorperazine (COMPAZINE) 10 MG tablet TAKE 1 TABLET (10 MG TOTAL) BY MOUTH EVERY 6 (SIX) HOURS AS NEEDED FOR NAUSEA OR VOMITING. 30 tablet 0  . ramipril (ALTACE) 2.5 MG capsule TAKE 1 CAPSULE BY MOUTH EVERY OTHER DAY 45 capsule 3  . rosuvastatin (CRESTOR) 40 MG tablet TAKE 1 TABLET BY MOUTH ONCE DAILY 90 tablet 2  . sitaGLIPtin (JANUVIA) 100 MG tablet TAKE 1 TABLET BY MOUTH ONCE DAILY 30 tablet 5  . traMADol (ULTRAM) 50 MG tablet TAKE 1 TABLET (50 MG TOTAL) BY MOUTH EVERY 6 (SIX) HOURS AS NEEDED. 10 tablet 0  . UNIFINE PENTIPS 31G X 8 MM MISC USE WITH RX OF LEVEMIR AND HUMALOG FOUR TIMES DAILY.    Marland Kitchen UNIFINE PENTIPS 32G X 4 MM MISC USE WITH LEVEMIR AND HUMALOG 4X A DAY DX-E11.65    . valACYclovir (VALTREX) 500 MG tablet Take 1 tablet (500 mg total) by mouth 2 (two) times daily. Take for 3 days for outbreak 30 tablet 2   No current facility-administered medications for this visit.    PHYSICAL EXAMINATION: ECOG PERFORMANCE STATUS: 1 - Symptomatic but completely ambulatory  There were no vitals filed for this visit. There were no vitals filed for this visit.   LABORATORY DATA:  I have reviewed the data as listed CMP Latest Ref Rng & Units 10/31/2020 10/24/2020 10/17/2020  Glucose 70 - 99 mg/dL 75 76 90  BUN 8 - 23 mg/dL 6(L) 5(L) 4(L)  Creatinine 0.44 - 1.00 mg/dL 0.63 0.61 0.66  Sodium 135 - 145 mmol/L 145 142 141  Potassium 3.5 - 5.1 mmol/L 3.3(L) 3.2(L) 3.3(L)  Chloride 98 - 111 mmol/L 107 106 107  CO2 22 - 32 mmol/L '28 28 26  ' Calcium 8.9 - 10.3 mg/dL 9.1 9.2 9.1  Total Protein 6.5 - 8.1 g/dL 6.2(L) 6.4(L) 6.7  Total Bilirubin 0.3 - 1.2 mg/dL 1.6(H) 1.0 1.8(H)  Alkaline Phos 38 - 126 U/L 78 72 69  AST 15 - 41 U/L '27 31 27  ' ALT 0 - 44 U/L '21 19 24     ' Lab Results  Component Value Date   WBC 7.1 10/31/2020   HGB 12.4 10/31/2020   HCT 37.4 10/31/2020   MCV 84.0 10/31/2020   PLT 253 10/31/2020   NEUTROABS 4.2 10/31/2020    ASSESSMENT & PLAN:  Malignant neoplasm of upper-outer quadrant of right breast in female, estrogen receptor positive ([place]) 08/08/2020:Screening detected right breast mass 2.2 cm by ultrasound upper outer quadrant biopsy: Grade 2-3 IDC with DCIS ER 60%, PR 20%, Ki-67 15%, HER-2 +3+ T2N0 stage Ia Treatment plan: 1.Neoadjuvant chemotherapy with Taxol and Herceptin weekly x12 followed by Herceptin maintenance  2.breast conserving surgery with some [place] biopsy 3.Adjuvant radiation 4.Adjuvant antiestrogen therapy  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 Diarrhea: She has diarrhea every other day in spite of Lomotil.  I will reduce the dosage of Taxol for this reason.. 2. Mild fatigue 3. Hypokalemia-likely secondary to diarrhea.  On oral potassium 20 meq daily  4. Decreased appetite/weight loss 5. Peripheral Neuropathy: taxol dose reduced.  I ordered a breast MRI to be done after last cycle of chemo. She will continue Herceptin every 3 weeks after that. Return to clinic weekly for chemo and every other week for follow-up with [place]    No orders of the defined types were placed in this encounter.  The patient has a good understanding of the overall plan. she agrees with it. she will call with any problems that may develop before the next visit here.  Total time spent: 30 mins including face to face time and time spent for planning, charting and coordination of care  Rulon Eisenmenger, MD, MPH 10/31/2020  I, Cloyde Reams Dorshimer, am acting as scribe for Dr. Nicholas Lose.  I have reviewed the above documentation for accuracy and completeness, and I agree with the above.

## 2020-10-31 ENCOUNTER — Inpatient Hospital Stay: Payer: 59

## 2020-10-31 ENCOUNTER — Other Ambulatory Visit: Payer: 59

## 2020-10-31 ENCOUNTER — Inpatient Hospital Stay: Payer: 59 | Attending: Hematology and Oncology

## 2020-10-31 ENCOUNTER — Encounter: Payer: Self-pay | Admitting: *Deleted

## 2020-10-31 ENCOUNTER — Other Ambulatory Visit (HOSPITAL_COMMUNITY): Payer: Self-pay | Admitting: Internal Medicine

## 2020-10-31 ENCOUNTER — Inpatient Hospital Stay (HOSPITAL_BASED_OUTPATIENT_CLINIC_OR_DEPARTMENT_OTHER): Payer: 59 | Admitting: Hematology and Oncology

## 2020-10-31 ENCOUNTER — Other Ambulatory Visit: Payer: Self-pay

## 2020-10-31 ENCOUNTER — Other Ambulatory Visit (HOSPITAL_COMMUNITY): Payer: Self-pay

## 2020-10-31 DIAGNOSIS — C50411 Malignant neoplasm of upper-outer quadrant of right female breast: Secondary | ICD-10-CM | POA: Insufficient documentation

## 2020-10-31 DIAGNOSIS — Z95828 Presence of other vascular implants and grafts: Secondary | ICD-10-CM

## 2020-10-31 DIAGNOSIS — R197 Diarrhea, unspecified: Secondary | ICD-10-CM | POA: Diagnosis not present

## 2020-10-31 DIAGNOSIS — T451X5A Adverse effect of antineoplastic and immunosuppressive drugs, initial encounter: Secondary | ICD-10-CM | POA: Insufficient documentation

## 2020-10-31 DIAGNOSIS — Z170 Estrogen receptor positive status [ER+]: Secondary | ICD-10-CM

## 2020-10-31 DIAGNOSIS — Z801 Family history of malignant neoplasm of trachea, bronchus and lung: Secondary | ICD-10-CM | POA: Diagnosis not present

## 2020-10-31 DIAGNOSIS — R5383 Other fatigue: Secondary | ICD-10-CM | POA: Insufficient documentation

## 2020-10-31 DIAGNOSIS — G620 Drug-induced polyneuropathy: Secondary | ICD-10-CM | POA: Diagnosis not present

## 2020-10-31 DIAGNOSIS — R634 Abnormal weight loss: Secondary | ICD-10-CM | POA: Diagnosis not present

## 2020-10-31 DIAGNOSIS — Z880 Allergy status to penicillin: Secondary | ICD-10-CM | POA: Insufficient documentation

## 2020-10-31 DIAGNOSIS — E876 Hypokalemia: Secondary | ICD-10-CM | POA: Insufficient documentation

## 2020-10-31 DIAGNOSIS — Z5112 Encounter for antineoplastic immunotherapy: Secondary | ICD-10-CM | POA: Insufficient documentation

## 2020-10-31 LAB — CBC WITH DIFFERENTIAL (CANCER CENTER ONLY)
Abs Immature Granulocytes: 0.05 10*3/uL (ref 0.00–0.07)
Basophils Absolute: 0 10*3/uL (ref 0.0–0.1)
Basophils Relative: 0 %
Eosinophils Absolute: 0.1 10*3/uL (ref 0.0–0.5)
Eosinophils Relative: 2 %
HCT: 37.4 % (ref 36.0–46.0)
Hemoglobin: 12.4 g/dL (ref 12.0–15.0)
Immature Granulocytes: 1 %
Lymphocytes Relative: 26 %
Lymphs Abs: 1.8 10*3/uL (ref 0.7–4.0)
MCH: 27.9 pg (ref 26.0–34.0)
MCHC: 33.2 g/dL (ref 30.0–36.0)
MCV: 84 fL (ref 80.0–100.0)
Monocytes Absolute: 0.9 10*3/uL (ref 0.1–1.0)
Monocytes Relative: 12 %
Neutro Abs: 4.2 10*3/uL (ref 1.7–7.7)
Neutrophils Relative %: 59 %
Platelet Count: 253 10*3/uL (ref 150–400)
RBC: 4.45 MIL/uL (ref 3.87–5.11)
RDW: 16.7 % — ABNORMAL HIGH (ref 11.5–15.5)
WBC Count: 7.1 10*3/uL (ref 4.0–10.5)
nRBC: 0 % (ref 0.0–0.2)

## 2020-10-31 LAB — CMP (CANCER CENTER ONLY)
ALT: 21 U/L (ref 0–44)
AST: 27 U/L (ref 15–41)
Albumin: 3.3 g/dL — ABNORMAL LOW (ref 3.5–5.0)
Alkaline Phosphatase: 78 U/L (ref 38–126)
Anion gap: 10 (ref 5–15)
BUN: 6 mg/dL — ABNORMAL LOW (ref 8–23)
CO2: 28 mmol/L (ref 22–32)
Calcium: 9.1 mg/dL (ref 8.9–10.3)
Chloride: 107 mmol/L (ref 98–111)
Creatinine: 0.63 mg/dL (ref 0.44–1.00)
GFR, Estimated: >60 mL/min (ref >60)
Glucose, Bld: 75 mg/dL (ref 70–99)
Potassium: 3.3 mmol/L — ABNORMAL LOW (ref 3.5–5.1)
Sodium: 145 mmol/L (ref 135–145)
Total Bilirubin: 1.6 mg/dL — ABNORMAL HIGH (ref 0.3–1.2)
Total Protein: 6.2 g/dL — ABNORMAL LOW (ref 6.5–8.1)

## 2020-10-31 MED ORDER — TRASTUZUMAB-ANNS CHEMO 150 MG IV SOLR
150.0000 mg | Freq: Once | INTRAVENOUS | Status: AC
  Administered 2020-10-31: 150 mg via INTRAVENOUS
  Filled 2020-10-31: qty 7.14

## 2020-10-31 MED ORDER — SODIUM CHLORIDE 0.9% FLUSH
10.0000 mL | INTRAVENOUS | Status: DC | PRN
  Administered 2020-10-31: 10 mL
  Filled 2020-10-31: qty 10

## 2020-10-31 MED ORDER — FAMOTIDINE 20 MG IN NS 100 ML IVPB
20.0000 mg | Freq: Once | INTRAVENOUS | Status: AC
  Administered 2020-10-31: 20 mg via INTRAVENOUS

## 2020-10-31 MED ORDER — SODIUM CHLORIDE 0.9 % IV SOLN
10.0000 mg | Freq: Once | INTRAVENOUS | Status: AC
  Administered 2020-10-31: 10 mg via INTRAVENOUS
  Filled 2020-10-31: qty 10

## 2020-10-31 MED ORDER — SODIUM CHLORIDE 0.9% FLUSH
10.0000 mL | Freq: Once | INTRAVENOUS | Status: AC
  Administered 2020-10-31: 10 mL
  Filled 2020-10-31: qty 10

## 2020-10-31 MED ORDER — PACLITAXEL CHEMO INJECTION 300 MG/50ML
50.0000 mg/m2 | Freq: Once | INTRAVENOUS | Status: AC
  Administered 2020-10-31: 90 mg via INTRAVENOUS
  Filled 2020-10-31: qty 15

## 2020-10-31 MED ORDER — HEPARIN SOD (PORK) LOCK FLUSH 100 UNIT/ML IV SOLN
500.0000 [IU] | Freq: Once | INTRAVENOUS | Status: AC | PRN
  Administered 2020-10-31: 500 [IU]
  Filled 2020-10-31: qty 5

## 2020-10-31 MED ORDER — SODIUM CHLORIDE 0.9 % IV SOLN
Freq: Once | INTRAVENOUS | Status: AC
  Filled 2020-10-31: qty 250

## 2020-10-31 MED ORDER — DIPHENHYDRAMINE HCL 50 MG/ML IJ SOLN
25.0000 mg | Freq: Once | INTRAMUSCULAR | Status: AC
  Administered 2020-10-31: 25 mg via INTRAVENOUS

## 2020-10-31 MED ORDER — FAMOTIDINE 20 MG IN NS 100 ML IVPB
INTRAVENOUS | Status: AC
  Filled 2020-10-31: qty 100

## 2020-10-31 MED ORDER — DIPHENHYDRAMINE HCL 50 MG/ML IJ SOLN
INTRAMUSCULAR | Status: AC
  Filled 2020-10-31: qty 1

## 2020-10-31 MED ORDER — COLD PACK MISC ONCOLOGY
1.0000 | Freq: Once | Status: DC | PRN
  Filled 2020-10-31: qty 1

## 2020-10-31 MED ORDER — ACETAMINOPHEN 325 MG PO TABS
ORAL_TABLET | ORAL | Status: AC
  Filled 2020-10-31: qty 2

## 2020-10-31 MED ORDER — ACETAMINOPHEN 325 MG PO TABS
650.0000 mg | ORAL_TABLET | Freq: Once | ORAL | Status: AC
  Administered 2020-10-31: 650 mg via ORAL

## 2020-10-31 NOTE — Patient Instructions (Signed)
[facility]COLOG[facility]  Discharge Instructions: Thank you for choosing [facility] to provide your oncology and hematology care.   If you have a lab appointment with the Cancer Center, please go directly to the Cancer Center and check in at the registration area.   Wear comfortable clothing and clothing appropriate for easy access to any Portacath or PICC line.   We strive to give you quality time with your provider. You may need to reschedule your appointment if you arrive late (15 or more minutes).  Arriving late affects you and other patients whose appointments are after yours.  Also, if you miss three or more appointments without notifying the office, you may be dismissed from the clinic at the provider's discretion.      For prescription refill requests, have your pharmacy contact our office and allow 72 hours for refills to be completed.    Today you received the following chemotherapy and/or immunotherapy agents Kan[place]inti, Taxol       To help prevent nausea and vomiting after your treatment, we encourage you to take your nausea medication as directed.  BELOW ARE SYMPTOMS THAT SHOULD BE REPORTED IMMEDIATELY: *FEVER GREATER THAN 100.4 F (38 C) OR HIGHER *CHILLS OR SWEATING *NAUSEA AND VOMITING THAT IS NOT CONTROLLED WITH YOUR NAUSEA MEDICATION *UNUSUAL SHORTNESS OF BREATH *UNUSUAL BRUISING OR BLEEDING *URINARY PROBLEMS (pain or burning when urinating, or frequent urination) *BOWEL PROBLEMS (unusual diarrhea, constipation, pain near the anus) TENDERNESS IN MOUTH AND THROAT WITH OR WITHOUT PRESENCE OF ULCERS (sore throat, sores in mouth, or a toothache) UNUSUAL RASH, SWELLING OR PAIN  UNUSUAL VAGINAL DISCHARGE OR ITCHING   Items with * indicate a potential emergency and should be followed up as soon as possible or go to the Emergency Department if any problems should occur.  Please show the CHEMOTHERAPY ALERT CARD or IMMUNOTHERAPY ALERT CARD at  check-in to the Emergency Department and triage nurse.  Should you have questions after your visit or need to cancel or reschedule your appointment, please contact [facility]  Dept: [phone]  and follow the prompts.  Office hours are 8:00 a.m. to 4:30 p.m. Monday - Friday. Please note that voicemails left after 4:00 p.m. may not be returned until the following business day.  We are closed weekends and major holidays. You have access to a nurse at all times for urgent questions. Please call the main number to the clinic Dept: [phone] and follow the prompts.   For any non-urgent questions, you may also contact your provider using MyChart. We now offer e-Visits for anyone 18 and older to request care online for non-urgent symptoms. For details visit [email or web address].   Also download the MyChart app! Go to the app store, search "MyChart", open the app, select , and log in with your MyChart username and password.  Due to Covid, a mask is required upon entering the hospital/clinic. If you do not have a mask, one will be given to you upon arrival. For doctor visits, patients may have 1 support person aged 18 or older with them. For treatment visits, patients cannot have anyone with them due to current Covid guidelines and our immunocompromised population.   

## 2020-10-31 NOTE — Progress Notes (Signed)
Okay to treat with labs per Dr. Lindi Adie.

## 2020-11-03 ENCOUNTER — Other Ambulatory Visit (HOSPITAL_COMMUNITY): Payer: Self-pay

## 2020-11-03 MED ORDER — ROSUVASTATIN CALCIUM 40 MG PO TABS
ORAL_TABLET | Freq: Every day | ORAL | 2 refills | Status: DC
  Filled 2020-11-03: qty 90, 90d supply, fill #0
  Filled 2021-02-12: qty 90, 90d supply, fill #1
  Filled 2021-06-04: qty 90, 90d supply, fill #2

## 2020-11-05 ENCOUNTER — Telehealth: Payer: Self-pay | Admitting: Hematology and Oncology

## 2020-11-05 NOTE — Telephone Encounter (Signed)
Scheduled per 5/6 los. Pt will receive an updated appt calendar per next visit

## 2020-11-06 DIAGNOSIS — R079 Chest pain, unspecified: Secondary | ICD-10-CM | POA: Diagnosis not present

## 2020-11-06 DIAGNOSIS — C50411 Malignant neoplasm of upper-outer quadrant of right female breast: Secondary | ICD-10-CM | POA: Diagnosis not present

## 2020-11-06 DIAGNOSIS — E11319 Type 2 diabetes mellitus with unspecified diabetic retinopathy without macular edema: Secondary | ICD-10-CM | POA: Diagnosis not present

## 2020-11-06 DIAGNOSIS — M1990 Unspecified osteoarthritis, unspecified site: Secondary | ICD-10-CM | POA: Diagnosis not present

## 2020-11-06 DIAGNOSIS — Z794 Long term (current) use of insulin: Secondary | ICD-10-CM | POA: Diagnosis not present

## 2020-11-06 DIAGNOSIS — F418 Other specified anxiety disorders: Secondary | ICD-10-CM | POA: Diagnosis not present

## 2020-11-06 DIAGNOSIS — E785 Hyperlipidemia, unspecified: Secondary | ICD-10-CM | POA: Diagnosis not present

## 2020-11-06 DIAGNOSIS — E669 Obesity, unspecified: Secondary | ICD-10-CM | POA: Diagnosis not present

## 2020-11-06 DIAGNOSIS — G629 Polyneuropathy, unspecified: Secondary | ICD-10-CM | POA: Diagnosis not present

## 2020-11-07 ENCOUNTER — Ambulatory Visit: Payer: 59 | Admitting: Hematology and Oncology

## 2020-11-07 ENCOUNTER — Encounter: Payer: Self-pay | Admitting: *Deleted

## 2020-11-07 ENCOUNTER — Inpatient Hospital Stay: Payer: 59 | Admitting: Dietician

## 2020-11-07 ENCOUNTER — Other Ambulatory Visit: Payer: Self-pay

## 2020-11-07 ENCOUNTER — Other Ambulatory Visit: Payer: 59

## 2020-11-07 ENCOUNTER — Inpatient Hospital Stay: Payer: 59

## 2020-11-07 VITALS — BP 126/48 | HR 78 | Temp 97.9°F | Resp 18 | Ht 64.0 in | Wt 155.2 lb

## 2020-11-07 DIAGNOSIS — C50411 Malignant neoplasm of upper-outer quadrant of right female breast: Secondary | ICD-10-CM

## 2020-11-07 DIAGNOSIS — R5383 Other fatigue: Secondary | ICD-10-CM | POA: Diagnosis not present

## 2020-11-07 DIAGNOSIS — Z5112 Encounter for antineoplastic immunotherapy: Secondary | ICD-10-CM | POA: Diagnosis not present

## 2020-11-07 DIAGNOSIS — R634 Abnormal weight loss: Secondary | ICD-10-CM | POA: Diagnosis not present

## 2020-11-07 DIAGNOSIS — Z170 Estrogen receptor positive status [ER+]: Secondary | ICD-10-CM

## 2020-11-07 DIAGNOSIS — G620 Drug-induced polyneuropathy: Secondary | ICD-10-CM | POA: Diagnosis not present

## 2020-11-07 DIAGNOSIS — T451X5A Adverse effect of antineoplastic and immunosuppressive drugs, initial encounter: Secondary | ICD-10-CM | POA: Diagnosis not present

## 2020-11-07 DIAGNOSIS — R197 Diarrhea, unspecified: Secondary | ICD-10-CM | POA: Diagnosis not present

## 2020-11-07 DIAGNOSIS — E876 Hypokalemia: Secondary | ICD-10-CM | POA: Diagnosis not present

## 2020-11-07 LAB — CBC WITH DIFFERENTIAL (CANCER CENTER ONLY)
Abs Immature Granulocytes: 0.07 10*3/uL (ref 0.00–0.07)
Basophils Absolute: 0 10*3/uL (ref 0.0–0.1)
Basophils Relative: 0 %
Eosinophils Absolute: 0.1 10*3/uL (ref 0.0–0.5)
Eosinophils Relative: 2 %
HCT: 37.6 % (ref 36.0–46.0)
Hemoglobin: 12.4 g/dL (ref 12.0–15.0)
Immature Granulocytes: 1 %
Lymphocytes Relative: 26 %
Lymphs Abs: 1.9 10*3/uL (ref 0.7–4.0)
MCH: 27.9 pg (ref 26.0–34.0)
MCHC: 33 g/dL (ref 30.0–36.0)
MCV: 84.7 fL (ref 80.0–100.0)
Monocytes Absolute: 0.6 10*3/uL (ref 0.1–1.0)
Monocytes Relative: 8 %
Neutro Abs: 4.7 10*3/uL (ref 1.7–7.7)
Neutrophils Relative %: 63 %
Platelet Count: 276 10*3/uL (ref 150–400)
RBC: 4.44 MIL/uL (ref 3.87–5.11)
RDW: 16.7 % — ABNORMAL HIGH (ref 11.5–15.5)
WBC Count: 7.4 10*3/uL (ref 4.0–10.5)
nRBC: 0 % (ref 0.0–0.2)

## 2020-11-07 LAB — CMP (CANCER CENTER ONLY)
ALT: 17 U/L (ref 0–44)
AST: 20 U/L (ref 15–41)
Albumin: 3.5 g/dL (ref 3.5–5.0)
Alkaline Phosphatase: 90 U/L (ref 38–126)
Anion gap: 6 (ref 5–15)
BUN: <4 mg/dL — ABNORMAL LOW (ref 8–23)
CO2: 30 mmol/L (ref 22–32)
Calcium: 9.4 mg/dL (ref 8.9–10.3)
Chloride: 108 mmol/L (ref 98–111)
Creatinine: 0.66 mg/dL (ref 0.44–1.00)
GFR, Estimated: >60 mL/min (ref >60)
Glucose, Bld: 119 mg/dL — ABNORMAL HIGH (ref 70–99)
Potassium: 3.7 mmol/L (ref 3.5–5.1)
Sodium: 144 mmol/L (ref 135–145)
Total Bilirubin: 1.2 mg/dL (ref 0.3–1.2)
Total Protein: 6.6 g/dL (ref 6.5–8.1)

## 2020-11-07 MED ORDER — ACETAMINOPHEN 325 MG PO TABS
ORAL_TABLET | ORAL | Status: AC
  Filled 2020-11-07: qty 2

## 2020-11-07 MED ORDER — ACETAMINOPHEN 325 MG PO TABS
650.0000 mg | ORAL_TABLET | Freq: Once | ORAL | Status: AC
  Administered 2020-11-07: 650 mg via ORAL

## 2020-11-07 MED ORDER — DIPHENHYDRAMINE HCL 50 MG/ML IJ SOLN
INTRAMUSCULAR | Status: AC
  Filled 2020-11-07: qty 1

## 2020-11-07 MED ORDER — SODIUM CHLORIDE 0.9 % IV SOLN
10.0000 mg | Freq: Once | INTRAVENOUS | Status: AC
  Administered 2020-11-07: 10 mg via INTRAVENOUS
  Filled 2020-11-07: qty 10

## 2020-11-07 MED ORDER — DIPHENHYDRAMINE HCL 50 MG/ML IJ SOLN
25.0000 mg | Freq: Once | INTRAMUSCULAR | Status: AC
  Administered 2020-11-07: 25 mg via INTRAVENOUS

## 2020-11-07 MED ORDER — SODIUM CHLORIDE 0.9 % IV SOLN
Freq: Once | INTRAVENOUS | Status: AC
  Filled 2020-11-07: qty 250

## 2020-11-07 MED ORDER — FAMOTIDINE 20 MG IN NS 100 ML IVPB
INTRAVENOUS | Status: AC
  Filled 2020-11-07: qty 100

## 2020-11-07 MED ORDER — TRASTUZUMAB-ANNS CHEMO 150 MG IV SOLR
150.0000 mg | Freq: Once | INTRAVENOUS | Status: AC
  Administered 2020-11-07: 150 mg via INTRAVENOUS
  Filled 2020-11-07: qty 7.14

## 2020-11-07 MED ORDER — FAMOTIDINE 20 MG IN NS 100 ML IVPB
20.0000 mg | Freq: Once | INTRAVENOUS | Status: AC
  Administered 2020-11-07: 20 mg via INTRAVENOUS

## 2020-11-07 MED ORDER — SODIUM CHLORIDE 0.9% FLUSH
10.0000 mL | INTRAVENOUS | Status: DC | PRN
  Administered 2020-11-07: 10 mL
  Filled 2020-11-07: qty 10

## 2020-11-07 MED ORDER — HEPARIN SOD (PORK) LOCK FLUSH 100 UNIT/ML IV SOLN
500.0000 [IU] | Freq: Once | INTRAVENOUS | Status: AC | PRN
  Administered 2020-11-07: 500 [IU]
  Filled 2020-11-07: qty 5

## 2020-11-07 MED ORDER — SODIUM CHLORIDE 0.9 % IV SOLN
50.0000 mg/m2 | Freq: Once | INTRAVENOUS | Status: AC
  Administered 2020-11-07: 90 mg via INTRAVENOUS
  Filled 2020-11-07: qty 15

## 2020-11-07 NOTE — Progress Notes (Signed)
Nutrition follow up completed with patient during infusion for breast cancer. She is receiving Taxol and Herceptin. Weight decreased and documented as 155.25 lb on May 13 decreased from 160.2 lb on April 14. Labs include glucose 119 and BUN <4. K improved at 3.7 on oral K. Patient reports she has intermittent nausea and diarrhea. She is taking Zofran twice a day but not every day. She takes medication for diarrhea with good results. Reports she is taking her pancreatic enzymes. She has tried all the supplements provided and really does not care for them.  Nutrition Diagnosis: Unintentional weight loss continues.  Intervention: Recommended increase Zofran to every 8 hours as prescription written. Use compazine as needed/prescribed. Continue anti-diarrhea medications as prescribed. Encouraged small meals/snacks. Increase fluids to avoid dehydration as needed. Provided additional nutrition fact sheets.  Monitoring, Evaluation, Goals: Increase calories and protein to minimize further weight loss.  Next Visit:Friday, June 17, during infusion.

## 2020-11-07 NOTE — Patient Instructions (Signed)
[facility]   Discharge Instructions: Thank you for choosing [facility] to provide your oncology and hematology care.   Mackie Pai, Insurance Nurse's Number: [phone]  If you have a lab appointment with the [facility], please go directly to the [facility] and check in at the registration area.   Wear comfortable clothing and clothing appropriate for easy access to any Portacath or PICC line.   We strive to give you quality time with your provider. You may need to reschedule your appointment if you arrive late (15 or more minutes).  Arriving late affects you and other patients whose appointments are after yours.  Also, if you miss three or more appointments without notifying the office, you may be dismissed from the clinic at the provider's discretion.      For prescription refill requests, have your pharmacy contact our office and allow 72 hours for refills to be completed.    Today you received the following chemotherapy and/or immunotherapy agents: Trastuzumab (Herceptin) and Paclitaxel (Taxol)   To help prevent nausea and vomiting after your treatment, we encourage you to take your nausea medication as directed.  BELOW ARE SYMPTOMS THAT SHOULD BE REPORTED IMMEDIATELY: . *FEVER GREATER THAN 100.4 F (38 C) OR HIGHER . *CHILLS OR SWEATING . *NAUSEA AND VOMITING THAT IS NOT CONTROLLED WITH YOUR NAUSEA MEDICATION . *UNUSUAL SHORTNESS OF BREATH . *UNUSUAL BRUISING OR BLEEDING . *URINARY PROBLEMS (pain or burning when urinating, or frequent urination) . *BOWEL PROBLEMS (unusual diarrhea, constipation, pain near the anus) . TENDERNESS IN MOUTH AND THROAT WITH OR WITHOUT PRESENCE OF ULCERS (sore throat, sores in mouth, or a toothache) . UNUSUAL RASH, SWELLING OR PAIN  . UNUSUAL VAGINAL DISCHARGE OR ITCHING   Items with * indicate a potential emergency and should be followed up as soon as possible or go to the Emergency Department if any  problems should occur.  Please show the CHEMOTHERAPY ALERT CARD or IMMUNOTHERAPY ALERT CARD at check-in to the Emergency Department and triage nurse.  Should you have questions after your visit or need to cancel or reschedule your appointment, please contact [facility]  Dept: [phone]  and follow the prompts.  Office hours are 8:00 a.m. to 4:30 p.m. Monday - Friday. Please note that voicemails left after 4:00 p.m. may not be returned until the following business day.  We are closed weekends and major holidays. You have access to a nurse at all times for urgent questions. Please call the main number to the clinic Dept: [phone] and follow the prompts.   For any non-urgent questions, you may also contact your provider using MyChart. We now offer e-Visits for anyone 41 and older to request care online for non-urgent symptoms. For details visit [email or web address].   Also download the MyChart app! Go to the app store, search "MyChart", open the app, select [facility], and log in with your MyChart username and password.  Due to Covid, a mask is required upon entering the hospital/clinic. If you do not have a mask, one will be given to you upon arrival. For doctor visits, patients may have 1 support person aged 99 or older with them. For treatment visits, patients cannot have anyone with them due to current Covid guidelines and our immunocompromised population.

## 2020-11-10 ENCOUNTER — Other Ambulatory Visit (HOSPITAL_COMMUNITY): Payer: Self-pay

## 2020-11-10 MED FILL — Sitagliptin Phosphate Tab 100 MG (Base Equiv): ORAL | 30 days supply | Qty: 30 | Fill #1 | Status: AC

## 2020-11-11 ENCOUNTER — Other Ambulatory Visit (HOSPITAL_COMMUNITY): Payer: Self-pay

## 2020-11-11 ENCOUNTER — Telehealth: Payer: Self-pay | Admitting: *Deleted

## 2020-11-11 MED FILL — Insulin Glargine-yfgn Soln Pen-Injector 100 Unit/ML: SUBCUTANEOUS | 22 days supply | Qty: 15 | Fill #0 | Status: AC

## 2020-11-11 NOTE — Telephone Encounter (Signed)
Spoke with patient to give her appt with Dr. Cristal Generous office for 5/31 at 1015.  She has to check with work and she stated she may have to change it. Gave her the contact info for Dr. Cristal Generous office and told her to call them if she needed to change the appt.  Patient verbalized understanding.

## 2020-11-13 ENCOUNTER — Other Ambulatory Visit: Payer: Self-pay | Admitting: *Deleted

## 2020-11-13 DIAGNOSIS — Z79899 Other long term (current) drug therapy: Secondary | ICD-10-CM

## 2020-11-13 DIAGNOSIS — Z170 Estrogen receptor positive status [ER+]: Secondary | ICD-10-CM

## 2020-11-13 DIAGNOSIS — Z5181 Encounter for therapeutic drug level monitoring: Secondary | ICD-10-CM

## 2020-11-13 DIAGNOSIS — C50411 Malignant neoplasm of upper-outer quadrant of right female breast: Secondary | ICD-10-CM

## 2020-11-13 NOTE — Assessment & Plan Note (Signed)
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 Diarrhea:She has diarrhea every other day in spite of Lomotil. I will reduce the dosage of Taxol for this reason.. 2. Mild fatigue 3. Hypokalemia-likely secondary to diarrhea.On oral potassium20 meq daily  4. Decreased appetite/weight loss 5. Peripheral Neuropathy: taxol dose reduced.  I ordered a breast MRI to be done after last cycle of chemo. She will continue Herceptin every 3 weeks after that.

## 2020-11-13 NOTE — Progress Notes (Signed)
Patient Care Team: Shon Baton, MD as PCP - General (Internal Medicine) Buford Dresser, MD as PCP - Cardiology (Cardiology) Mauro Kaufmann, RN as Oncology Nurse Navigator Rockwell Germany, RN as Oncology Nurse Navigator  DIAGNOSIS:    ICD-10-CM   1. Malignant neoplasm of upper-outer quadrant of right breast in female, estrogen receptor positive ([place])  C50.411    Z17.0     SUMMARY OF ONCOLOGIC HISTORY: Oncology History  Malignant neoplasm of upper-outer quadrant of right breast in female, estrogen receptor positive ([place])  08/08/2020 Initial Diagnosis   Screening detected right breast mass 2.2 cm by ultrasound upper outer quadrant biopsy: Grade 2-3 IDC with DCIS ER 60%, PR 20%, Ki-67 15%, HER-2 +3+   08/15/2020 Cancer Staging   Staging form: Breast, AJCC 8th Edition - Clinical stage from 08/15/2020: Stage IB (cT2, cN0, cM0, G3, ER+, PR+, HER2+) - Signed by Nicholas Lose, MD on 08/15/2020 Stage prefix: Initial diagnosis   08/27/2020 -  Chemotherapy    Patient is on Treatment Plan: BREAST PACLITAXEL + TRASTUZUMAB Q7D / TRASTUZUMAB Q21D        CHIEF COMPLIANT: Cycle12Taxol Herceptin  INTERVAL HISTORY: Rachel Gaines is a 65 y.o. with above-mentioned history of right breast cancer currently on neoadjuvant chemotherapy withTaxol Herceptin.She presents to the clinic today for cycle12.  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  She does not have any nausea or vomiting.  Skin and nail changes.  ALLERGIES:  is allergic to penicillins.  MEDICATIONS:  Current Outpatient Medications  Medication Sig Dispense Refill  . albuterol (VENTOLIN HFA) 108 (90 Base) MCG/ACT inhaler INHALE 1 TO 2 PUFFS BY MOUTH INTO THE LUNGS EVERY 4 TO 6 HOURS AS NEEDED FOR SHORTNESS OF BREATH/WHEEZING/COUGH URGENCY 18 g 0  . aspirin EC 81 MG tablet Take 81 mg by mouth daily.    . benzonatate (TESSALON) 200 MG capsule TAKE 1 CAPSULE BY MOUTH 3 TIMES DAILY  AS NEEDED 30 capsule 0  . Biotin 5 MG CAPS Take 1 capsule by mouth daily.    . Cholecalciferol (VITAMIN D3) 125 MCG (5000 UT) CAPS 1 capsule    . Continuous Blood Gluc Sensor (FREESTYLE LIBRE 2 SENSOR) MISC Change every 14 days to monitor blood glucose continuosly    . Continuous Blood Gluc Sensor (FREESTYLE LIBRE 2 SENSOR) MISC Change every 14 days to monitor blood sugar as directed 9 each 3  . diclofenac Sodium (VOLTAREN) 1 % GEL SMARTSIG:2 Gram(s) Topical 1 to 4 Times Daily PRN    . diphenoxylate-atropine (LOMOTIL) 2.5-0.025 MG tablet TAKE 1 TABLET BY MOUTH 4 (FOUR) TIMES DAILY AS NEEDED FOR DIARRHEA OR LOOSE STOOLS. 30 tablet 0  . empagliflozin (JARDIANCE) 10 MG TABS tablet TAKE 1 TABLET BY MOUTH ONCE A DAY 30 tablet 3  . FREESTYLE LITE test strip     . gabapentin (NEURONTIN) 300 MG capsule Take 300 mg by mouth at bedtime. Only takes at night    . HUMALOG KWIKPEN 100 UNIT/ML KwikPen 10 Units 3 (three) times daily.    . insulin detemir (LEVEMIR) 100 UNIT/ML FlexPen INJECT 34 UNITS SUBCUTANEOUSLY TWICE DAILY 15 mL 3  . Insulin Glargine (BASAGLAR KWIKPEN) 100 UNIT/ML INJECT 34 UNITS UNDER THE SKIN TWICE DAILY 45 mL 3  . Insulin Glargine-yfgn 100 UNIT/ML SOPN INJECT 34 UNITS SUBCUTANEOUSLY TWICE DAILY 15 mL 3  . insulin lispro (HUMALOG) 100 UNIT/ML KwikPen Inject 14 Units into the skin 3 (three) times daily with meals. 45 mL 3  .  LANTUS SOLOSTAR 100 UNIT/ML Solostar Pen Inject 34 Units into the skin 2 (two) times daily. If CBG <80 in AM, take only 20 units.   1  . lidocaine-prilocaine (EMLA) cream APPLY 1 APPLICATION TOPICALLY AS NEEDED. 30 g 0  . NONFORMULARY OR COMPOUNDED ITEM [facility]:  Antiinflammatory cream - Diclofenac 3%, Baclofen 2%, Cyclobenzaprine 2%, Lidocaine 2%, dispense 120grams, apply 1-2 grams to affected area 3-4 times a day, +2refills. 120 each 2  . ondansetron (ZOFRAN) 8 MG tablet TAKE 1 TABLET (8 MG TOTAL) BY MOUTH EVERY 8 (EIGHT) HOURS AS NEEDED FOR NAUSEA OR VOMITING.  30 tablet 0  . Pancrelipase, Lip-Prot-Amyl, (ZENPEP) 40000-126000 units CPEP Take 1 capsule by mouth in the morning and at bedtime. 120 capsule   . Pancrelipase, Lip-Prot-Amyl, 40000-126000 units CPEP TAKE 2 CAPSULES BY MOUTH WITH MEALS AND 1 CAPSULE WITH SNACKS 240 capsule 12  . potassium chloride SA (KLOR-CON) 20 MEQ tablet TAKE 1 TABLET (20 MEQ TOTAL) BY MOUTH DAILY. 30 tablet 0  . prochlorperazine (COMPAZINE) 10 MG tablet TAKE 1 TABLET (10 MG TOTAL) BY MOUTH EVERY 6 (SIX) HOURS AS NEEDED FOR NAUSEA OR VOMITING. 30 tablet 0  . ramipril (ALTACE) 2.5 MG capsule TAKE 1 CAPSULE BY MOUTH EVERY OTHER DAY 45 capsule 3  . rosuvastatin (CRESTOR) 40 MG tablet TAKE 1 TABLET BY MOUTH ONCE DAILY 90 tablet 2  . sitaGLIPtin (JANUVIA) 100 MG tablet TAKE 1 TABLET BY MOUTH ONCE DAILY 30 tablet 5  . traMADol (ULTRAM) 50 MG tablet TAKE 1 TABLET (50 MG TOTAL) BY MOUTH EVERY 6 (SIX) HOURS AS NEEDED. 10 tablet 0  . UNIFINE PENTIPS 31G X 8 MM MISC USE WITH RX OF LEVEMIR AND HUMALOG FOUR TIMES DAILY.    Marland Kitchen UNIFINE PENTIPS 32G X 4 MM MISC USE WITH LEVEMIR AND HUMALOG 4X A DAY DX-E11.65    . valACYclovir (VALTREX) 500 MG tablet Take 1 tablet (500 mg total) by mouth 2 (two) times daily. Take for 3 days for outbreak 30 tablet 2   No current facility-administered medications for this visit.    PHYSICAL EXAMINATION: ECOG PERFORMANCE STATUS: 1 - Symptomatic but completely ambulatory  There were no vitals filed for this visit. There were no vitals filed for this visit.  LABORATORY DATA:  I have reviewed the data as listed CMP Latest Ref Rng & Units 11/07/2020 10/31/2020 10/24/2020  Glucose 70 - 99 mg/dL 119(H) 75 76  BUN 8 - 23 mg/dL <4(L) 6(L) 5(L)  Creatinine 0.44 - 1.00 mg/dL 0.66 0.63 0.61  Sodium 135 - 145 mmol/L 144 145 142  Potassium 3.5 - 5.1 mmol/L 3.7 3.3(L) 3.2(L)  Chloride 98 - 111 mmol/L 108 107 106  CO2 22 - 32 mmol/L _0 Calcium 8.9 - 10.3 mg/dL 9.4 9.1 9.2  Total Protein 6.5 - 8.1 g/dL 6.6 6.2(L)  6.4(L)  Total Bilirubin 0.3 - 1.2 mg/dL 1.2 1.6(H) 1.0  Alkaline Phos 38 - 126 U/L 90 78 72  AST 15 - 41 U/L _1 ALT 0 - 44 U/L _2 Lab Results  Component Value Date   WBC 7.4 11/07/2020   HGB 12.4 11/07/2020   HCT 37.6 11/07/2020   MCV 84.7 11/07/2020   PLT 276 11/07/2020   NEUTROABS 4.7 11/07/2020    ASSESSMENT & PLAN:  Malignant neoplasm of upper-outer quadrant of right breast in female, estrogen receptor positive ([place]) 08/08/2020:Screening detected right breast mass 2.2 cm by ultrasound upper outer quadrant biopsy: Grade  2-3 IDC with DCIS ER 60%, PR 20%, Ki-67 15%, HER-2 +3+ T2N0 stage Ia Treatment plan: 1.Neoadjuvant chemotherapy with Taxol and Herceptin weekly x12 followed by Herceptin maintenance 2.breast conserving surgery with some [place] biopsy 3.Adjuvant radiation 4.Adjuvant antiestrogen therapy  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 Diarrhea:Diarrhea is under good control 2. Mild fatigue 3. Hypokalemia-likely secondary to diarrhea.On oral potassium20 meq daily  4. Decreased appetite/weight loss 5. Peripheral Neuropathy: taxol dose reduced.  Stable symptoms.  MRI breast has been scheduled for 11/17/2020 She has an appoint with surgery on 11/25/2020  She will continue Herceptin every 3 weeks after that.    No orders of the defined types were placed in this encounter.  The patient has a good understanding of the overall plan. she agrees with it. she will call with any problems that may develop before the next visit here.  Total time spent: 30 mins including face to face time and time spent for planning, charting and coordination of care  Rulon Eisenmenger, MD, MPH 11/14/2020  I, Molly Dorshimer, am acting as scribe for Dr. Nicholas Lose.  I have reviewed the above documentation for accuracy and completeness, and I agree with the  above.

## 2020-11-14 ENCOUNTER — Inpatient Hospital Stay: Payer: 59 | Admitting: Hematology and Oncology

## 2020-11-14 ENCOUNTER — Encounter: Payer: Self-pay | Admitting: *Deleted

## 2020-11-14 ENCOUNTER — Other Ambulatory Visit: Payer: Self-pay

## 2020-11-14 ENCOUNTER — Inpatient Hospital Stay: Payer: 59

## 2020-11-14 DIAGNOSIS — Z170 Estrogen receptor positive status [ER+]: Secondary | ICD-10-CM

## 2020-11-14 DIAGNOSIS — C50411 Malignant neoplasm of upper-outer quadrant of right female breast: Secondary | ICD-10-CM

## 2020-11-14 DIAGNOSIS — Z5112 Encounter for antineoplastic immunotherapy: Secondary | ICD-10-CM | POA: Diagnosis not present

## 2020-11-14 DIAGNOSIS — T451X5A Adverse effect of antineoplastic and immunosuppressive drugs, initial encounter: Secondary | ICD-10-CM | POA: Diagnosis not present

## 2020-11-14 DIAGNOSIS — E876 Hypokalemia: Secondary | ICD-10-CM | POA: Diagnosis not present

## 2020-11-14 DIAGNOSIS — R197 Diarrhea, unspecified: Secondary | ICD-10-CM | POA: Diagnosis not present

## 2020-11-14 DIAGNOSIS — Z95828 Presence of other vascular implants and grafts: Secondary | ICD-10-CM

## 2020-11-14 DIAGNOSIS — R5383 Other fatigue: Secondary | ICD-10-CM | POA: Diagnosis not present

## 2020-11-14 DIAGNOSIS — G620 Drug-induced polyneuropathy: Secondary | ICD-10-CM | POA: Diagnosis not present

## 2020-11-14 DIAGNOSIS — R634 Abnormal weight loss: Secondary | ICD-10-CM | POA: Diagnosis not present

## 2020-11-14 LAB — CBC WITH DIFFERENTIAL (CANCER CENTER ONLY)
Abs Immature Granulocytes: 0.05 10*3/uL (ref 0.00–0.07)
Basophils Absolute: 0 10*3/uL (ref 0.0–0.1)
Basophils Relative: 1 %
Eosinophils Absolute: 0.2 10*3/uL (ref 0.0–0.5)
Eosinophils Relative: 3 %
HCT: 37.1 % (ref 36.0–46.0)
Hemoglobin: 12 g/dL (ref 12.0–15.0)
Immature Granulocytes: 1 %
Lymphocytes Relative: 32 %
Lymphs Abs: 1.8 10*3/uL (ref 0.7–4.0)
MCH: 27.9 pg (ref 26.0–34.0)
MCHC: 32.3 g/dL (ref 30.0–36.0)
MCV: 86.3 fL (ref 80.0–100.0)
Monocytes Absolute: 0.4 10*3/uL (ref 0.1–1.0)
Monocytes Relative: 8 %
Neutro Abs: 3.1 10*3/uL (ref 1.7–7.7)
Neutrophils Relative %: 55 %
Platelet Count: 274 10*3/uL (ref 150–400)
RBC: 4.3 MIL/uL (ref 3.87–5.11)
RDW: 16.7 % — ABNORMAL HIGH (ref 11.5–15.5)
WBC Count: 5.6 10*3/uL (ref 4.0–10.5)
nRBC: 0 % (ref 0.0–0.2)

## 2020-11-14 LAB — CMP (CANCER CENTER ONLY)
ALT: 19 U/L (ref 0–44)
AST: 23 U/L (ref 15–41)
Albumin: 3.4 g/dL — ABNORMAL LOW (ref 3.5–5.0)
Alkaline Phosphatase: 80 U/L (ref 38–126)
Anion gap: 7 (ref 5–15)
BUN: 4 mg/dL — ABNORMAL LOW (ref 8–23)
CO2: 30 mmol/L (ref 22–32)
Calcium: 9.4 mg/dL (ref 8.9–10.3)
Chloride: 108 mmol/L (ref 98–111)
Creatinine: 0.68 mg/dL (ref 0.44–1.00)
GFR, Estimated: >60 mL/min (ref >60)
Glucose, Bld: 100 mg/dL — ABNORMAL HIGH (ref 70–99)
Potassium: 3.7 mmol/L (ref 3.5–5.1)
Sodium: 145 mmol/L (ref 135–145)
Total Bilirubin: 1.1 mg/dL (ref 0.3–1.2)
Total Protein: 6.2 g/dL — ABNORMAL LOW (ref 6.5–8.1)

## 2020-11-14 MED ORDER — ACETAMINOPHEN 325 MG PO TABS
650.0000 mg | ORAL_TABLET | Freq: Once | ORAL | Status: AC
  Administered 2020-11-14: 650 mg via ORAL

## 2020-11-14 MED ORDER — DIPHENHYDRAMINE HCL 50 MG/ML IJ SOLN
25.0000 mg | Freq: Once | INTRAMUSCULAR | Status: AC
  Administered 2020-11-14: 25 mg via INTRAVENOUS

## 2020-11-14 MED ORDER — FAMOTIDINE 20 MG IN NS 100 ML IVPB
INTRAVENOUS | Status: AC
  Filled 2020-11-14: qty 100

## 2020-11-14 MED ORDER — ACETAMINOPHEN 325 MG PO TABS
ORAL_TABLET | ORAL | Status: AC
  Filled 2020-11-14: qty 2

## 2020-11-14 MED ORDER — SODIUM CHLORIDE 0.9 % IV SOLN
10.0000 mg | Freq: Once | INTRAVENOUS | Status: AC
  Administered 2020-11-14: 10 mg via INTRAVENOUS
  Filled 2020-11-14: qty 10

## 2020-11-14 MED ORDER — FAMOTIDINE 20 MG IN NS 100 ML IVPB
20.0000 mg | Freq: Once | INTRAVENOUS | Status: AC
  Administered 2020-11-14: 20 mg via INTRAVENOUS

## 2020-11-14 MED ORDER — SODIUM CHLORIDE 0.9 % IV SOLN
Freq: Once | INTRAVENOUS | Status: AC
  Filled 2020-11-14: qty 250

## 2020-11-14 MED ORDER — SODIUM CHLORIDE 0.9% FLUSH
10.0000 mL | INTRAVENOUS | Status: DC | PRN
  Administered 2020-11-14: 10 mL
  Filled 2020-11-14: qty 10

## 2020-11-14 MED ORDER — SODIUM CHLORIDE 0.9 % IV SOLN
50.0000 mg/m2 | Freq: Once | INTRAVENOUS | Status: AC
  Administered 2020-11-14: 90 mg via INTRAVENOUS
  Filled 2020-11-14: qty 15

## 2020-11-14 MED ORDER — SODIUM CHLORIDE 0.9% FLUSH
10.0000 mL | Freq: Once | INTRAVENOUS | Status: AC
  Administered 2020-11-14: 10 mL
  Filled 2020-11-14: qty 10

## 2020-11-14 MED ORDER — DIPHENHYDRAMINE HCL 50 MG/ML IJ SOLN
INTRAMUSCULAR | Status: AC
  Filled 2020-11-14: qty 1

## 2020-11-14 MED ORDER — HEPARIN SOD (PORK) LOCK FLUSH 100 UNIT/ML IV SOLN
500.0000 [IU] | Freq: Once | INTRAVENOUS | Status: AC | PRN
  Administered 2020-11-14: 500 [IU]
  Filled 2020-11-14: qty 5

## 2020-11-14 MED ORDER — TRASTUZUMAB-ANNS CHEMO 150 MG IV SOLR
150.0000 mg | Freq: Once | INTRAVENOUS | Status: AC
  Administered 2020-11-14: 150 mg via INTRAVENOUS
  Filled 2020-11-14: qty 7.14

## 2020-11-14 NOTE — Patient Instructions (Signed)
[facility]COL[facility]   Discharge Instructions: Thank you for choosing [facility] to provide your oncology and hematology care.   If you have a lab appointment with the Cancer Center, please go directly to the Cancer Center and check in at the registration area.   Wear comfortable clothing and clothing appropriate for easy access to any Portacath or PICC line.   We strive to give you quality time with your provider. You may need to reschedule your appointment if you arrive late (15 or more minutes).  Arriving late affects you and other patients whose appointments are after yours.  Also, if you miss three or more appointments without notifying the office, you may be dismissed from the clinic at the provider's discretion.      For prescription refill requests, have your pharmacy contact our office and allow 72 hours for refills to be completed.    Today you received the following chemotherapy and/or immunotherapy agents: trastuzumab and paclitaxel.      To help prevent nausea and vomiting after your treatment, we encourage you to take your nausea medication as directed.  BELOW ARE SYMPTOMS THAT SHOULD BE REPORTED IMMEDIATELY: *FEVER GREATER THAN 100.4 F (38 C) OR HIGHER *CHILLS OR SWEATING *NAUSEA AND VOMITING THAT IS NOT CONTROLLED WITH YOUR NAUSEA MEDICATION *UNUSUAL SHORTNESS OF BREATH *UNUSUAL BRUISING OR BLEEDING *URINARY PROBLEMS (pain or burning when urinating, or frequent urination) *BOWEL PROBLEMS (unusual diarrhea, constipation, pain near the anus) TENDERNESS IN MOUTH AND THROAT WITH OR WITHOUT PRESENCE OF ULCERS (sore throat, sores in mouth, or a toothache) UNUSUAL RASH, SWELLING OR PAIN  UNUSUAL VAGINAL DISCHARGE OR ITCHING   Items with * indicate a potential emergency and should be followed up as soon as possible or go to the Emergency Department if any problems should occur.  Please show the CHEMOTHERAPY ALERT CARD or IMMUNOTHERAPY ALERT  CARD at check-in to the Emergency Department and triage nurse.  Should you have questions after your visit or need to cancel or reschedule your appointment, please contact [facility]  Dept: [phone]  and follow the prompts.  Office hours are 8:00 a.m. to 4:30 p.m. Monday - Friday. Please note that voicemails left after 4:00 p.m. may not be returned until the following business day.  We are closed weekends and major holidays. You have access to a nurse at all times for urgent questions. Please call the main number to the clinic Dept: [phone] and follow the prompts.   For any non-urgent questions, you may also contact your provider using MyChart. We now offer e-Visits for anyone 18 and older to request care online for non-urgent symptoms. For details visit [email or web address].   Also download the MyChart app! Go to the app store, search "MyChart", open the app, select [facility], and log in with your MyChart username and password.  Due to Covid, a mask is required upon entering the hospital/clinic. If you do not have a mask, one will be given to you upon arrival. For doctor visits, patients may have 1 support person aged 18 or older with them. For treatment visits, patients cannot have anyone with them due to current Covid guidelines and our immunocompromised population.   

## 2020-11-17 ENCOUNTER — Other Ambulatory Visit: Payer: Self-pay

## 2020-11-17 ENCOUNTER — Ambulatory Visit
Admission: RE | Admit: 2020-11-17 | Discharge: 2020-11-17 | Disposition: A | Discharge status: Home / Self Care | Payer: 59 | Source: Ambulatory Visit | Attending: Hematology and Oncology | Admitting: Hematology and Oncology

## 2020-11-17 DIAGNOSIS — C50411 Malignant neoplasm of upper-outer quadrant of right female breast: Secondary | ICD-10-CM

## 2020-11-17 DIAGNOSIS — Z170 Estrogen receptor positive status [ER+]: Secondary | ICD-10-CM

## 2020-11-17 DIAGNOSIS — C50911 Malignant neoplasm of unspecified site of right female breast: Secondary | ICD-10-CM | POA: Diagnosis not present

## 2020-11-17 MED ORDER — GADOBUTROL 1 MMOL/ML IV SOLN
7.0000 mL | Freq: Once | INTRAVENOUS | Status: AC | PRN
  Administered 2020-11-17: 7 mL via INTRAVENOUS

## 2020-11-18 ENCOUNTER — Other Ambulatory Visit: Payer: Self-pay | Admitting: Hematology and Oncology

## 2020-11-18 ENCOUNTER — Telehealth: Payer: Self-pay | Admitting: *Deleted

## 2020-11-18 ENCOUNTER — Other Ambulatory Visit: Payer: Self-pay | Admitting: *Deleted

## 2020-11-18 DIAGNOSIS — C50411 Malignant neoplasm of upper-outer quadrant of right female breast: Secondary | ICD-10-CM

## 2020-11-18 DIAGNOSIS — Z170 Estrogen receptor positive status [ER+]: Secondary | ICD-10-CM

## 2020-11-18 NOTE — Telephone Encounter (Signed)
Spoke with patient to let her know that her MRI showed an additional area in her right breast that it is recommended for bx.  Informed her someone from GI will be calling her to schedule. Patient verbalized understanding.

## 2020-11-19 ENCOUNTER — Encounter: Payer: Self-pay | Admitting: *Deleted

## 2020-11-20 ENCOUNTER — Telehealth: Payer: Self-pay

## 2020-11-20 ENCOUNTER — Encounter: Payer: Self-pay | Admitting: *Deleted

## 2020-11-20 NOTE — Telephone Encounter (Signed)
Pt called to inquire about appt for bx and to ask what her infusion was for. Informed pt that GI would be in touch to schedule bx, and infusion is for Herceptin. Pt verbalized thanks and understanding.

## 2020-11-21 ENCOUNTER — Other Ambulatory Visit: Payer: Self-pay

## 2020-11-21 ENCOUNTER — Inpatient Hospital Stay: Payer: 59

## 2020-11-21 VITALS — BP 121/64 | HR 81 | Temp 98.0°F | Resp 18 | Wt 156.8 lb

## 2020-11-21 DIAGNOSIS — R5383 Other fatigue: Secondary | ICD-10-CM | POA: Diagnosis not present

## 2020-11-21 DIAGNOSIS — G620 Drug-induced polyneuropathy: Secondary | ICD-10-CM | POA: Diagnosis not present

## 2020-11-21 DIAGNOSIS — C50411 Malignant neoplasm of upper-outer quadrant of right female breast: Secondary | ICD-10-CM | POA: Diagnosis not present

## 2020-11-21 DIAGNOSIS — Z5112 Encounter for antineoplastic immunotherapy: Secondary | ICD-10-CM | POA: Diagnosis not present

## 2020-11-21 DIAGNOSIS — Z170 Estrogen receptor positive status [ER+]: Secondary | ICD-10-CM

## 2020-11-21 DIAGNOSIS — T451X5A Adverse effect of antineoplastic and immunosuppressive drugs, initial encounter: Secondary | ICD-10-CM | POA: Diagnosis not present

## 2020-11-21 DIAGNOSIS — R197 Diarrhea, unspecified: Secondary | ICD-10-CM | POA: Diagnosis not present

## 2020-11-21 DIAGNOSIS — R634 Abnormal weight loss: Secondary | ICD-10-CM | POA: Diagnosis not present

## 2020-11-21 DIAGNOSIS — E876 Hypokalemia: Secondary | ICD-10-CM | POA: Diagnosis not present

## 2020-11-21 MED ORDER — ACETAMINOPHEN 325 MG PO TABS
650.0000 mg | ORAL_TABLET | Freq: Once | ORAL | Status: AC
  Administered 2020-11-21: 650 mg via ORAL

## 2020-11-21 MED ORDER — HEPARIN SOD (PORK) LOCK FLUSH 100 UNIT/ML IV SOLN
500.0000 [IU] | Freq: Once | INTRAVENOUS | Status: AC | PRN
  Administered 2020-11-21: 500 [IU]
  Filled 2020-11-21: qty 5

## 2020-11-21 MED ORDER — TRASTUZUMAB-ANNS CHEMO 150 MG IV SOLR
450.0000 mg | Freq: Once | INTRAVENOUS | Status: AC
  Administered 2020-11-21: 450 mg via INTRAVENOUS
  Filled 2020-11-21: qty 21.43

## 2020-11-21 MED ORDER — DIPHENHYDRAMINE HCL 25 MG PO CAPS
50.0000 mg | ORAL_CAPSULE | Freq: Once | ORAL | Status: AC
  Administered 2020-11-21: 50 mg via ORAL

## 2020-11-21 MED ORDER — DIPHENHYDRAMINE HCL 25 MG PO CAPS
ORAL_CAPSULE | ORAL | Status: AC
  Filled 2020-11-21: qty 2

## 2020-11-21 MED ORDER — SODIUM CHLORIDE 0.9 % IV SOLN
Freq: Once | INTRAVENOUS | Status: AC
  Filled 2020-11-21: qty 250

## 2020-11-21 MED ORDER — SODIUM CHLORIDE 0.9% FLUSH
10.0000 mL | INTRAVENOUS | Status: DC | PRN
  Administered 2020-11-21: 10 mL
  Filled 2020-11-21: qty 10

## 2020-11-21 MED ORDER — ACETAMINOPHEN 325 MG PO TABS
ORAL_TABLET | ORAL | Status: AC
  Filled 2020-11-21: qty 2

## 2020-11-21 NOTE — Patient Instructions (Signed)
[facility]  Discharge Instructions: Thank you for choosing [facility] to provide your oncology and hematology care.   If you have a lab appointment with the [facility], please go directly to the [facility] and check in at the registration area.   Wear comfortable clothing and clothing appropriate for easy access to any Portacath or PICC line.   We strive to give you quality time with your provider. You may need to reschedule your appointment if you arrive late (15 or more minutes).  Arriving late affects you and other patients whose appointments are after yours.  Also, if you miss three or more appointments without notifying the office, you may be dismissed from the clinic at the provider's discretion.      For prescription refill requests, have your pharmacy contact our office and allow 72 hours for refills to be completed.    Today you received the following chemotherapy and/or immunotherapy agents [place]    To help prevent nausea and vomiting after your treatment, we encourage you to take your nausea medication as directed.  BELOW ARE SYMPTOMS THAT SHOULD BE REPORTED IMMEDIATELY: . *FEVER GREATER THAN 100.4 F (38 C) OR HIGHER . *CHILLS OR SWEATING . *NAUSEA AND VOMITING THAT IS NOT CONTROLLED WITH YOUR NAUSEA MEDICATION . *UNUSUAL SHORTNESS OF BREATH . *UNUSUAL BRUISING OR BLEEDING . *URINARY PROBLEMS (pain or burning when urinating, or frequent urination) . *BOWEL PROBLEMS (unusual diarrhea, constipation, pain near the anus) . TENDERNESS IN MOUTH AND THROAT WITH OR WITHOUT PRESENCE OF ULCERS (sore throat, sores in mouth, or a toothache) . UNUSUAL RASH, SWELLING OR PAIN  . UNUSUAL VAGINAL DISCHARGE OR ITCHING   Items with * indicate a potential emergency and should be followed up as soon as possible or go to the Emergency Department if any problems should occur.  Please show the CHEMOTHERAPY ALERT CARD or IMMUNOTHERAPY ALERT CARD  at check-in to the Emergency Department and triage nurse.  Should you have questions after your visit or need to cancel or reschedule your appointment, please contact [facility]  Dept: [phone]  and follow the prompts.  Office hours are 8:00 a.m. to 4:30 p.m. Monday - Friday. Please note that voicemails left after 4:00 p.m. may not be returned until the following business day.  We are closed weekends and major holidays. You have access to a nurse at all times for urgent questions. Please call the main number to the clinic Dept: [phone] and follow the prompts.   For any non-urgent questions, you may also contact your provider using MyChart. We now offer e-Visits for anyone 25 and older to request care online for non-urgent symptoms. For details visit [email or web address].   Also download the MyChart app! Go to the app store, search "MyChart", open the app, select [facility], and log in with your MyChart username and password.  Due to Covid, a mask is required upon entering the hospital/clinic. If you do not have a mask, one will be given to you upon arrival. For doctor visits, patients may have 1 support person aged 57 or older with them. For treatment visits, patients cannot have anyone with them due to current Covid guidelines and our immunocompromised population.   Trastuzumab injection for infusion What is this medicine? TRASTUZUMAB (tras TOO zoo mab) is a monoclonal antibody. It is used to treat breast cancer and stomach cancer. This medicine may be used for other purposes; ask your health care provider or  pharmacist if you have questions. COMMON BRAND NAME(S): Herceptin, Galvin Proffer, Trazimera What should I tell my health care provider before I take this medicine? They need to know if you have any of these conditions:  heart disease  heart failure  lung or breathing disease, like asthma  an unusual or allergic reaction  to trastuzumab, benzyl alcohol, or other medications, foods, dyes, or preservatives  pregnant or trying to get pregnant  breast-feeding How should I use this medicine? This drug is given as an infusion into a vein. It is administered in a hospital or clinic by a specially trained health care professional. Talk to your pediatrician regarding the use of this medicine in children. This medicine is not approved for use in children. Overdosage: If you think you have taken too much of this medicine contact a poison control center or emergency room at once. NOTE: This medicine is only for you. Do not share this medicine with others. What if I miss a dose? It is important not to miss a dose. Call your doctor or health care professional if you are unable to keep an appointment. What may interact with this medicine? This medicine may interact with the following medications:  certain types of chemotherapy, such as daunorubicin, doxorubicin, epirubicin, and idarubicin This list may not describe all possible interactions. Give your health care provider a list of all the medicines, herbs, non-prescription drugs, or dietary supplements you use. Also tell them if you smoke, drink alcohol, or use illegal drugs. Some items may interact with your medicine. What should I watch for while using this medicine? Visit your doctor for checks on your progress. Report any side effects. Continue your course of treatment even though you feel ill unless your doctor tells you to stop. Call your doctor or health care professional for advice if you get a fever, chills or sore throat, or other symptoms of a cold or flu. Do not treat yourself. Try to avoid being around people who are sick. You may experience fever, chills and shaking during your first infusion. These effects are usually mild and can be treated with other medicines. Report any side effects during the infusion to your health care professional. Fever and chills  usually do not happen with later infusions. Do not become pregnant while taking this medicine or for 7 months after stopping it. Women should inform their doctor if they wish to become pregnant or think they might be pregnant. Women of child-bearing potential will need to have a negative pregnancy test before starting this medicine. There is a potential for serious side effects to an unborn child. Talk to your health care professional or pharmacist for more information. Do not breast-feed an infant while taking this medicine or for 7 months after stopping it. Women must use effective birth control with this medicine. What side effects may I notice from receiving this medicine? Side effects that you should report to your doctor or health care professional as soon as possible:  allergic reactions like skin rash, itching or hives, swelling of the face, lips, or tongue  chest pain or palpitations  cough  dizziness  feeling faint or lightheaded, falls  fever  general ill feeling or flu-like symptoms  signs of worsening heart failure like breathing problems; swelling in your legs and feet  unusually weak or tired Side effects that usually do not require medical attention (report to your doctor or health care professional if they continue or are bothersome):  bone pain  changes in taste  diarrhea  joint pain  nausea/vomiting  weight loss This list may not describe all possible side effects. Call your doctor for medical advice about side effects. You may report side effects to FDA at 1-800-FDA-1088. Where should I keep my medicine? This drug is given in a hospital or clinic and will not be stored at home. NOTE: This sheet is a summary. It may not cover all possible information. If you have questions about this medicine, talk to your doctor, pharmacist, or health care provider.  2021 Elsevier/Gold Standard (2016-06-08 14:37:52)

## 2020-11-25 ENCOUNTER — Other Ambulatory Visit: Payer: Self-pay | Admitting: General Surgery

## 2020-11-25 DIAGNOSIS — C50411 Malignant neoplasm of upper-outer quadrant of right female breast: Secondary | ICD-10-CM | POA: Diagnosis not present

## 2020-11-25 DIAGNOSIS — Z170 Estrogen receptor positive status [ER+]: Secondary | ICD-10-CM

## 2020-11-26 ENCOUNTER — Ambulatory Visit (HOSPITAL_COMMUNITY)
Admission: RE | Admit: 2020-11-26 | Discharge: 2020-11-26 | Disposition: A | Discharge status: Home / Self Care | Payer: 59 | Source: Ambulatory Visit | Attending: Hematology and Oncology | Admitting: Hematology and Oncology

## 2020-11-26 ENCOUNTER — Other Ambulatory Visit: Payer: Self-pay

## 2020-11-26 DIAGNOSIS — C50411 Malignant neoplasm of upper-outer quadrant of right female breast: Secondary | ICD-10-CM | POA: Diagnosis not present

## 2020-11-26 DIAGNOSIS — Z79899 Other long term (current) drug therapy: Secondary | ICD-10-CM | POA: Diagnosis not present

## 2020-11-26 DIAGNOSIS — E785 Hyperlipidemia, unspecified: Secondary | ICD-10-CM | POA: Insufficient documentation

## 2020-11-26 DIAGNOSIS — C50919 Malignant neoplasm of unspecified site of unspecified female breast: Secondary | ICD-10-CM

## 2020-11-26 DIAGNOSIS — Z5181 Encounter for therapeutic drug level monitoring: Secondary | ICD-10-CM | POA: Insufficient documentation

## 2020-11-26 DIAGNOSIS — Z0189 Encounter for other specified special examinations: Secondary | ICD-10-CM | POA: Diagnosis not present

## 2020-11-26 DIAGNOSIS — E119 Type 2 diabetes mellitus without complications: Secondary | ICD-10-CM | POA: Insufficient documentation

## 2020-11-26 DIAGNOSIS — Z170 Estrogen receptor positive status [ER+]: Secondary | ICD-10-CM | POA: Diagnosis not present

## 2020-11-26 HISTORY — DX: Malignant neoplasm of unspecified site of unspecified female breast: C50.919

## 2020-11-26 LAB — ECHOCARDIOGRAM COMPLETE
Area-P 1/2: 2.93 cm2
S' Lateral: 2.4 cm

## 2020-11-26 NOTE — Progress Notes (Signed)
  Echocardiogram 2D Echocardiogram with strain and 3D has been performed.  Rachel Gaines M 11/26/2020, 10:51 AM

## 2020-11-27 ENCOUNTER — Other Ambulatory Visit: Payer: Self-pay | Admitting: General Surgery

## 2020-11-27 ENCOUNTER — Other Ambulatory Visit: Payer: Self-pay

## 2020-11-27 ENCOUNTER — Ambulatory Visit
Admission: RE | Admit: 2020-11-27 | Discharge: 2020-11-27 | Disposition: A | Discharge status: Home / Self Care | Payer: 59 | Source: Ambulatory Visit | Attending: Hematology and Oncology | Admitting: Hematology and Oncology

## 2020-11-27 DIAGNOSIS — N641 Fat necrosis of breast: Secondary | ICD-10-CM | POA: Diagnosis not present

## 2020-11-27 DIAGNOSIS — C50411 Malignant neoplasm of upper-outer quadrant of right female breast: Secondary | ICD-10-CM

## 2020-11-27 DIAGNOSIS — Z170 Estrogen receptor positive status [ER+]: Secondary | ICD-10-CM

## 2020-11-27 DIAGNOSIS — N6311 Unspecified lump in the right breast, upper outer quadrant: Secondary | ICD-10-CM | POA: Diagnosis not present

## 2020-11-27 HISTORY — PX: BREAST BIOPSY: SHX20

## 2020-11-27 MED ORDER — GADOBUTROL 1 MMOL/ML IV SOLN: 10.0000 mL | Freq: Once | INTRAVENOUS | Status: DC | PRN

## 2020-11-28 ENCOUNTER — Encounter: Payer: Self-pay | Admitting: *Deleted

## 2020-12-01 ENCOUNTER — Other Ambulatory Visit (HOSPITAL_COMMUNITY): Payer: Self-pay

## 2020-12-01 MED FILL — Insulin Glargine-yfgn Soln Pen-Injector 100 Unit/ML: SUBCUTANEOUS | 66 days supply | Qty: 45 | Fill #1 | Status: AC

## 2020-12-08 NOTE — Progress Notes (Signed)
Surgical Instructions    Your procedure is scheduled on 6/23,2022  Report to [facility] Main Entrance "A" at 9 A.M., then check in with the Admitting office.  Call this number if you have problems the morning of surgery:  [phone]   If you have any questions prior to your surgery date call [phone]: Open Monday-Friday 8am-4pm    Remember:  Do not eat after midnight the night before your surgery  You may drink clear liquids until 8am the morning of your surgery.   Clear liquids allowed are: Water, Non-Citrus Juices (without pulp), Carbonated Beverages, Clear Tea, Black Coffee Only, and Gatorade    Take these medicines the morning of surgery with A SIP OF WATER :                        gabapentin (NEURONTIN)               rosuvastatin (CRESTOR)              albuterol (VENTOLIN HFA) if needed              ondansetron (ZOFRAN) if needed              prochlorperazine (COMPAZINE) if needed     HOW TO MANAGE YOUR DIABETES BEFORE AND AFTER SURGERY  Why is it important to control my blood sugar before and after surgery? Improving blood sugar levels before and after surgery helps healing and can limit problems. A way of improving blood sugar control is eating a healthy diet by:  Eating less sugar and carbohydrates  Increasing activity/exercise  Talking with your doctor about reaching your blood sugar goals High blood sugars (greater than 180 mg/dL) can raise your risk of infections and slow your recovery, so you will need to focus on controlling your diabetes during the weeks before surgery. Make sure that the doctor who takes care of your diabetes knows about your planned surgery including the date and location.  How do I manage my blood sugar before surgery? Check your blood sugar at least 4 times a day, starting 2 days before surgery, to make sure that the level is not too high or low. Check your blood sugar the morning of your surgery when you wake up and every 2 hours  until you get to the Short Stay unit. If your blood sugar is less than 70 mg/dL, you will need to treat for low blood sugar: Do not take insulin. Treat a low blood sugar (less than 70 mg/dL) with  cup of clear juice (cranberry or apple), 4 glucose tablets, OR glucose gel. Recheck blood sugar in 15 minutes after treatment (to make sure it is greater than 70 mg/dL). If your blood sugar is not greater than 70 mg/dL on recheck, call [phone]  for further instructions. Report your blood sugar to the short stay nurse when you get to Short Stay.  If you are admitted to the hospital after surgery: Your blood sugar will be checked by the staff and you will probably be given insulin after surgery (instead of oral diabetes medicines) to make sure you have good blood sugar levels. The goal for blood sugar control after surgery is 80-180 mg/dL.     WHAT DO I DO ABOUT MY DIABETES MEDICATION?  Do not take oral diabetes medicines (pills):  the morning of surgery. Hold Jardiance the day before surgery and the day of surgery.  THE NIGHT BEFORE SURGERY, take 17 units of Insulin Glargine-yfgn 100 UNIT/ML SOPN insulin.   Take your usual suppertime dose of Humalog insulin    n  THE MORNING OF SURGERY, take _17  units of Insulin Glargine-yfgn 100 UNIT/ML SOPN insulin.        If your blood sugar is greater than 220, you may take 1/2 of your usual Humalog insulin dose.   The day of surgery, do not take other diabetes injectables, including Byetta (exenatide), Bydureon (exenatide ER), Victoza (liraglutide), or Trulicity (dulaglutide). to  As of today, STOP taking any Aspirin (unless otherwise instructed by your surgeon) Aleve, Naproxen, Ibuprofen, Motrin, Advil, Goody's, BC's, all herbal medications, fish oil, and all vitamins.          Do not wear jewelry or makeup Do not wear lotions, powders, perfumes/colognes, or deodorant. Do not shave 48 hours prior to surgery.  Men may shave face and neck. Do  not bring valuables to the hospital. DO Not wear nail polish, gel polish, artificial nails, or any other type of covering on  natural nails including finger and toenails. If patients have artificial nails, gel coating, etc. that need to be removed by a nail salon please have this removed prior to surgery or surgery may need to be canceled/delayed if the surgeon/ anesthesia feels like the patient is unable to be adequately monitored.             [place] is not responsible for any belongings or valuables.  Do NOT Smoke (Tobacco/Vaping) or drink Alcohol 24 hours prior to your procedure If you use a CPAP at night, you may bring all equipment for your overnight stay.   Contacts, glasses, dentures or bridgework may not be worn into surgery, please bring cases for these belongings   For patients admitted to the hospital, discharge time will be determined by your treatment team.   Patients discharged the day of surgery will not be allowed to drive home, and someone needs to stay with them for 24 hours.  ONLY 1 SUPPORT PERSON MAY BE PRESENT WHILE YOU ARE IN SURGERY. IF YOU ARE TO BE ADMITTED ONCE YOU ARE IN YOUR ROOM YOU WILL BE ALLOWED TWO (2) VISITORS.  Minor children may have two parents present. Special consideration for safety and communication needs will be reviewed on a case by case basis.  Special instructions:    Oral Hygiene is also important to reduce your risk of infection.  Remember - BRUSH YOUR TEETH THE MORNING OF SURGERY WITH YOUR REGULAR TOOTHPASTE   [place]- Preparing For Surgery  Before surgery, you can play an important role. Because skin is not sterile, your skin needs to be as free of germs as possible. You can reduce the number of germs on your skin by washing with CHG (chlorahexidine gluconate) Soap before surgery.  CHG is an antiseptic cleaner which kills germs and bonds with the skin to continue killing germs even after washing.     Please do not use if you have an  allergy to CHG or antibacterial soaps. If your skin becomes reddened/irritated stop using the CHG.  Do not shave (including legs and underarms) for at least 48 hours prior to first CHG shower. It is OK to shave your face.  Please follow these instructions carefully.     Shower the NIGHT BEFORE SURGERY and the MORNING OF SURGERY with CHG Soap.   If you chose to wash your hair, wash your hair first as usual with  your normal shampoo. After you shampoo, rinse your hair and body thoroughly to remove the shampoo.  Then [employer] and genitals (private parts) with your normal soap and rinse thoroughly to remove soap.  After that Use CHG Soap as you would any other liquid soap. You can apply CHG directly to the skin and wash gently with a scrungie or a clean washcloth.   Apply the CHG Soap to your body ONLY FROM THE NECK DOWN.  Do not use on open wounds or open sores. Avoid contact with your eyes, ears, mouth and genitals (private parts). Wash Face and genitals (private parts)  with your normal soap.   Wash thoroughly, paying special attention to the area where your surgery will be performed.  Thoroughly rinse your body with warm water from the neck down.  DO NOT shower/wash with your normal soap after using and rinsing off the CHG Soap.  Pat yourself dry with a CLEAN TOWEL.  Wear CLEAN PAJAMAS to bed the night before surgery  Place CLEAN SHEETS on your bed the night before your surgery  DO NOT SLEEP WITH PETS.   Day of Surgery:  Take a shower with CHG soap. Wear Clean/Comfortable clothing the morning of surgery Do not apply any deodorants/lotions.   Remember to brush your teeth WITH YOUR REGULAR TOOTHPASTE.   Please read over the following fact sheets that you were given.

## 2020-12-09 ENCOUNTER — Encounter (HOSPITAL_COMMUNITY): Payer: Self-pay

## 2020-12-09 ENCOUNTER — Encounter (HOSPITAL_COMMUNITY)
Admission: RE | Admit: 2020-12-09 | Discharge: 2020-12-09 | Disposition: A | Discharge status: Home / Self Care | Payer: 59 | Source: Ambulatory Visit | Attending: General Surgery | Admitting: General Surgery

## 2020-12-09 ENCOUNTER — Other Ambulatory Visit: Payer: Self-pay

## 2020-12-09 DIAGNOSIS — Z01812 Encounter for preprocedural laboratory examination: Secondary | ICD-10-CM | POA: Diagnosis not present

## 2020-12-09 LAB — BASIC METABOLIC PANEL
Anion gap: 6 (ref 5–15)
BUN: 5 mg/dL — ABNORMAL LOW (ref 8–23)
CO2: 28 mmol/L (ref 22–32)
Calcium: 9.5 mg/dL (ref 8.9–10.3)
Chloride: 102 mmol/L (ref 98–111)
Creatinine, Ser: 0.69 mg/dL (ref 0.44–1.00)
GFR, Estimated: >60 mL/min (ref >60)
Glucose, Bld: 326 mg/dL — ABNORMAL HIGH (ref 70–99)
Potassium: 3.5 mmol/L (ref 3.5–5.1)
Sodium: 136 mmol/L (ref 135–145)

## 2020-12-09 LAB — GLUCOSE, CAPILLARY: Glucose-Capillary: 323 mg/dL — ABNORMAL HIGH (ref 70–99)

## 2020-12-09 NOTE — Progress Notes (Addendum)
Surgical Instructions    Your procedure is scheduled on 6/23,2022  Report to [facility] Main Entrance "A" at 9 A.M., then check in with the Admitting office.  Call this number if you have problems the morning of surgery:  [phone]   If you have any questions prior to your surgery date call [phone]: Open Monday-Friday 8am-4pm    Remember:  Do not eat after midnight the night before your surgery  You may drink clear liquids until 8am the morning of your surgery.   Clear liquids allowed are: Water, Non-Citrus Juices (without pulp), Carbonated Beverages, Clear Tea, Black Coffee Only, and Gatorade    Take these medicines the morning of surgery with A SIP OF WATER :                        gabapentin (NEURONTIN)               rosuvastatin (CRESTOR)              albuterol (VENTOLIN HFA) if needed              ondansetron (ZOFRAN) if needed              prochlorperazine (COMPAZINE) if needed     HOW TO MANAGE YOUR DIABETES BEFORE AND AFTER SURGERY  Why is it important to control my blood sugar before and after surgery? Improving blood sugar levels before and after surgery helps healing and can limit problems. A way of improving blood sugar control is eating a healthy diet by:  Eating less sugar and carbohydrates  Increasing activity/exercise  Talking with your doctor about reaching your blood sugar goals High blood sugars (greater than 180 mg/dL) can raise your risk of infections and slow your recovery, so you will need to focus on controlling your diabetes during the weeks before surgery. Make sure that the doctor who takes care of your diabetes knows about your planned surgery including the date and location.  How do I manage my blood sugar before surgery? Check your blood sugar at least 4 times a day, starting 2 days before surgery, to make sure that the level is not too high or low. Check your blood sugar the morning of your surgery when you wake up and every 2 hours  until you get to the Short Stay unit. If your blood sugar is less than 70 mg/dL, you will need to treat for low blood sugar: Do not take insulin. Treat a low blood sugar (less than 70 mg/dL) with  cup of clear juice (cranberry or apple), 4 glucose tablets, OR glucose gel. Recheck blood sugar in 15 minutes after treatment (to make sure it is greater than 70 mg/dL). If your blood sugar is not greater than 70 mg/dL on recheck, call [phone]  for further instructions. Report your blood sugar to the short stay nurse when you get to Short Stay.  If you are admitted to the hospital after surgery: Your blood sugar will be checked by the staff and you will probably be given insulin after surgery (instead of oral diabetes medicines) to make sure you have good blood sugar levels. The goal for blood sugar control after surgery is 80-180 mg/dL.     WHAT DO I DO ABOUT MY DIABETES MEDICATION?  Do not take oral diabetes medicines (pills):including Januvia  the morning of surgery. Hold Jardiance the day before surgery and the day of surgery.  THE NIGHT BEFORE SURGERY, take 17 units of Insulin Glargine-yfgn 100 UNIT/ML SOPN([place]) insulin.   Take your usual suppertime dose of Humalog insulin      THE MORNING OF SURGERY, take 17  units of Insulin Glargine-yfgn 100 UNIT/ML SOPN ( [place]) insulin.        If your blood sugar is greater than 220, you may take 1/2 of your usual Humalog insulin dose.   The day of surgery, do not take other diabetes injectables, including Byetta (exenatide), Bydureon (exenatide ER), Victoza (liraglutide), or Trulicity (dulaglutide). to  As of today, STOP taking any Aspirin (unless otherwise instructed by your surgeon) Aleve, Naproxen, Ibuprofen, Motrin, Advil, Goody's, BC's, all herbal medications, fish oil, and all vitamins.          Do not wear jewelry or makeup Do not wear lotions, powders, perfumes/colognes, or deodorant. Do not shave 48 hours prior to surgery.   Men may shave face and neck. Do not bring valuables to the hospital. DO Not wear nail polish, gel polish, artificial nails, or any other type of covering on  natural nails including finger and toenails. If patients have artificial nails, gel coating, etc. that need to be removed by a nail salon please have this removed prior to surgery or surgery may need to be canceled/delayed if the surgeon/ anesthesia feels like the patient is unable to be adequately monitored.             [place] is not responsible for any belongings or valuables.  Do NOT Smoke (Tobacco/Vaping) or drink Alcohol 24 hours prior to your procedure If you use a CPAP at night, you may bring all equipment for your overnight stay.   Contacts, glasses, dentures or bridgework may not be worn into surgery, please bring cases for these belongings   For patients admitted to the hospital, discharge time will be determined by your treatment team.   Patients discharged the day of surgery will not be allowed to drive home, and someone needs to stay with them for 24 hours.  ONLY 1 SUPPORT PERSON MAY BE PRESENT WHILE YOU ARE IN SURGERY. IF YOU ARE TO BE ADMITTED ONCE YOU ARE IN YOUR ROOM YOU WILL BE ALLOWED TWO (2) VISITORS.  Minor children may have two parents present. Special consideration for safety and communication needs will be reviewed on a case by case basis.  Special instructions:    Oral Hygiene is also important to reduce your risk of infection.  Remember - BRUSH YOUR TEETH THE MORNING OF SURGERY WITH YOUR REGULAR TOOTHPASTE   [place]- Preparing For Surgery  Before surgery, you can play an important role. Because skin is not sterile, your skin needs to be as free of germs as possible. You can reduce the number of germs on your skin by washing with CHG (chlorahexidine gluconate) Soap before surgery.  CHG is an antiseptic cleaner which kills germs and bonds with the skin to continue killing germs even after washing.      Please do not use if you have an allergy to CHG or antibacterial soaps. If your skin becomes reddened/irritated stop using the CHG.  Do not shave (including legs and underarms) for at least 48 hours prior to first CHG shower. It is OK to shave your face.  Please follow these instructions carefully.     Shower the NIGHT BEFORE SURGERY and the MORNING OF SURGERY with CHG Soap.   If you chose to wash your hair, wash your hair first as  usual with your normal shampoo. After you shampoo, rinse your hair and body thoroughly to remove the shampoo.  Then [employer] and genitals (private parts) with your normal soap and rinse thoroughly to remove soap.  After that Use CHG Soap as you would any other liquid soap. You can apply CHG directly to the skin and wash gently with a scrungie or a clean washcloth.   Apply the CHG Soap to your body ONLY FROM THE NECK DOWN.  Do not use on open wounds or open sores. Avoid contact with your eyes, ears, mouth and genitals (private parts). Wash Face and genitals (private parts)  with your normal soap.   Wash thoroughly, paying special attention to the area where your surgery will be performed.  Thoroughly rinse your body with warm water from the neck down.  DO NOT shower/wash with your normal soap after using and rinsing off the CHG Soap.  Pat yourself dry with a CLEAN TOWEL.  Wear CLEAN PAJAMAS to bed the night before surgery  Place CLEAN SHEETS on your bed the night before your surgery  DO NOT SLEEP WITH PETS.   Day of Surgery:  Take a shower with CHG soap. Wear Clean/Comfortable clothing the morning of surgery Do not apply any deodorants/lotions.   Remember to brush your teeth WITH YOUR REGULAR TOOTHPASTE.   Please read over the following fact sheets that you were given.

## 2020-12-09 NOTE — Progress Notes (Signed)
PCP - Dr. Shon Baton Cardiologist -   PPM/ICD -denies  Device Orders -  Rep Notified -   Chest x-ray -  EKG -07/07/20  Stress Test -  ECHO - 11/26/20 Cardiac Cath -denies  Sleep Study -denies  CPAP -   Fasting Blood Sugar -120  Checks Blood Sugar __2 times a day  Blood Thinner Instructions:n/a Aspirin Instructions:n/a  ERAS Protcol - clear liquids until 0800am PRE-SURGERY Ensure or G2- water  COVID TEST- n/a ambulatory patient   Anesthesia review: yes-seed patient.  Patient denies shortness of breath, fever, cough and chest pain at PAT appointment   All instructions explained to the patient, with a verbal understanding of the material. Patient agrees to go over the instructions while at home for a better understanding. Patient also instructed to self quarantine after being tested for COVID-19. The opportunity to ask questions was provided.

## 2020-12-11 NOTE — Progress Notes (Addendum)
Anesthesia Chart Review:  Case: [id number] Date/Time: 12/18/20 1045   Procedure: RIGHT BREAST LUMPECTOMY WITH RADIOACTIVE SEED AND RIGHT AXILLARY SENTINEL LYMPH NODE BIOPSY (Right: Breast)   Anesthesia type: General   Pre-op diagnosis: RIGHT BREAST CANCER   Location: [place] OR ROOM 07 / [place] OR   Surgeons: Rolm Bookbinder, MD       DISCUSSION: Patient is a 65 year old female scheduled for the above procedure.  History includes never smoker, DM2, anemia, HLD, right breast cancer (chemotherapy started 08/27/20; Taxol & Herceptin x12 then Herceptin maintenance). COVID-19, s/p bamlanivimab\etesevimab infusion 04/18/20. Port-a-cath 08/26/20.   Her next visit with Dr. Lindi Adie with infusion is scheduled for 12/12/20.   BCG 12/17/20 at 1:30 PM. Nuclear Medicine 12/18/20 at 10:30 AM.  Anesthesia team to evaluate patient on the day of surgery.   UPDATE 12/12/20 1:54 PM:  12/12/20 office visit note still pended from visit with Dr. Lindi Adie. S/p trastuzumab with plans to continued Q 3 weeks. She also had updated labs today through the [facility]. Results included: Lab Results  Component Value Date   WBC 7.9 12/12/2020   HGB 12.6 12/12/2020   HCT 37.7 12/12/2020   PLT 258 12/12/2020   GLUCOSE 102 (H) 12/12/2020   ALT 13 12/12/2020   AST 16 12/12/2020   NA 144 12/12/2020   K 3.5 12/12/2020   CL 109 12/12/2020   CREATININE 0.59 12/12/2020   BUN 7 (L) 12/12/2020   CO2 29 12/12/2020     VS: BP 127/69   Pulse 87   Temp 37.2 C (Oral)   Resp 18   Ht 5\' 4"  (1.626 m)   Wt 70.5 kg   LMP 06/28/2006 (Approximate)   SpO2 98%   BMI 26.69 kg/m    PROVIDERS: Shon Baton, MD is PCP  Buford Dresser, MD is cardiologist. Last visit 07/04/20 for chest pain/SOB follow-up. No CAD on Coronary CT 06/21/19. Two year follow-up planned.  Nicholas Lose, MD is HEM-ONC   LABS: Labs  from 11/14/20 and 12/09/20 reviewed. Glucose 326 after eating, had not taken DM medications. A1c 7.2% on 11/06/20 at [facility] (see Care Everywhere). Reported fasting CBGs ~ 120. She is for updated labs at [facility] on 12/12/20. (all labs ordered are listed, but only abnormal results are displayed)  Labs Reviewed  GLUCOSE, CAPILLARY - Abnormal; Notable for the following components:      Result Value   Glucose-Capillary 323 (*)    All other components within normal limits  BASIC METABOLIC PANEL - Abnormal; Notable for the following components:   Glucose, Bld 326 (*)    BUN 5 (*)    All other components within normal limits   CBC on 11/14/20 showed H/H 12.0/37.1, PLT 274.    IMAGES: 1V CXR 08/26/20: FINDINGS: Single intraoperative fluoroscopic image was obtained of the left chest. Left internal jugular Port-A-Cath is noted with distal tip in expected position of the SVC. IMPRESSION: Fluoroscopic guidance provided during left internal jugular Port-A-Cath placement.     EKG: 07/04/20 ([facility]): NSR. Anterior infarct (age undetermined)   CV: Echo 11/26/20: IMPRESSIONS   1. Left ventricular ejection fraction, by estimation, is 55 to 60%. Left  ventricular ejection fraction by 3D volume is 59 %. The left ventricle has  normal function. The left ventricle has no regional wall motion  abnormalities. Left ventricular diastolic   parameters are consistent with Grade I diastolic dysfunction (impaired  relaxation). The average left ventricular global longitudinal strain is  -20.5 %. The global longitudinal  strain is normal.   2. Right ventricular systolic function is normal. The right ventricular  size is normal. There is normal pulmonary artery systolic pressure. The  estimated right ventricular systolic pressure is 49.4 mmHg.   3. The mitral valve is normal in structure. Trivial mitral valve  regurgitation. No evidence of mitral stenosis.   4. The aortic valve is tricuspid. Aortic valve regurgitation is not  visualized. No aortic stenosis is present.   5. The inferior vena cava is normal in size with  greater than 50%  respiratory variability, suggesting right atrial pressure of 3 mmHg.  - Comparison(s): Compared to prior study on 08/22/20, there is no  significant change. GLS remains normal at -20.5 (previously -20.2).   Coronary CT 06/21/19: IMPRESSION: 1. No evidence of CAD, CADRADS = 0. 2. Coronary calcium score of 0. This was 0 percentile for age and sex matched control. 3. Normal coronary origin with right dominance.   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    Past Surgical History:  Procedure Laterality Date   CATARACT EXTRACTION  2019   COLONOSCOPY     COLPOSCOPY W/ BIOPSY / CURETTAGE  02/04/1999   Chronic cervicitis   EYE SURGERY     PORTACATH PLACEMENT N/A 08/26/2020   Procedure: INSERTION PORT-A-CATH;  Surgeon: Rolm Bookbinder, MD;  Location: [place];  Service: General;  Laterality: N/A;  START TIME OF 12:30 PM FOR 60 MINUTES ROOM 8    MEDICATIONS:  albuterol (VENTOLIN HFA) 108 (90 Base) MCG/ACT inhaler   benzonatate (TESSALON) 200 MG capsule   Biotin 5 MG CAPS   Cholecalciferol (VITAMIN D3) 125 MCG (5000 UT) CAPS   Continuous Blood Gluc Sensor (FREESTYLE LIBRE 2 SENSOR) MISC   Continuous Blood Gluc Sensor (FREESTYLE LIBRE 2 SENSOR) MISC   diclofenac Sodium (VOLTAREN) 1 % GEL   diphenoxylate-atropine (LOMOTIL) 2.5-0.025 MG tablet   empagliflozin (JARDIANCE) 10 MG TABS tablet   FREESTYLE LITE test strip   gabapentin (NEURONTIN) 300 MG capsule   insulin detemir (LEVEMIR) 100 UNIT/ML FlexPen   Insulin Glargine (BASAGLAR KWIKPEN) 100 UNIT/ML   Insulin Glargine-yfgn 100 UNIT/ML SOPN   insulin lispro (HUMALOG) 100 UNIT/ML KwikPen    lidocaine-prilocaine (EMLA) cream   NONFORMULARY OR COMPOUNDED ITEM   ondansetron (ZOFRAN) 8 MG tablet   Pancrelipase, Lip-Prot-Amyl, (ZENPEP) 40000-126000 units CPEP   Pancrelipase, Lip-Prot-Amyl, 40000-126000 units CPEP   potassium chloride SA (KLOR-CON) 20 MEQ tablet   prochlorperazine (COMPAZINE) 10 MG tablet   ramipril (ALTACE) 2.5 MG capsule   rosuvastatin (CRESTOR) 40 MG tablet   sitaGLIPtin (JANUVIA) 100 MG tablet   traMADol (ULTRAM) 50 MG tablet   UNIFINE PENTIPS 31G X 8 MM MISC   UNIFINE PENTIPS 32G X 4 MM MISC   valACYclovir (VALTREX) 500 MG tablet   No current facility-administered medications for this encounter.    Myra Gianotti, PA-C Surgical Short Stay/Anesthesiology [facility] Phone [phone] [facility] Phone [phone] 12/11/2020 6:35 PM

## 2020-12-11 NOTE — Assessment & Plan Note (Signed)
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 Diarrhea:Diarrhea is under good control 2. Mild fatigue 3. Hypokalemia-likely secondary to diarrhea.On oral potassium20 meq daily 4. Decreased appetite/weight loss 5. Peripheral Neuropathy: taxol dose reduced.  Stable symptoms.  MRI breast 11/17/2020: Right breast malignancy 2.2 cm, 5 mm FA (MRI biopsy: Benign fat necrosis)  She will continue Herceptin every 3 weeks after that.

## 2020-12-11 NOTE — Progress Notes (Signed)
Patient Care Team: Shon Baton, MD as PCP - General (Internal Medicine) Buford Dresser, MD as PCP - Cardiology (Cardiology) Mauro Kaufmann, RN as Oncology Nurse Navigator Rockwell Germany, RN as Oncology Nurse Navigator  DIAGNOSIS:    ICD-10-CM   1. Malignant neoplasm of upper-outer quadrant of right breast in female, estrogen receptor positive ([place])  C50.411    Z17.0       SUMMARY OF ONCOLOGIC HISTORY: Oncology History  Malignant neoplasm of upper-outer quadrant of right breast in female, estrogen receptor positive ([place])  08/08/2020 Initial Diagnosis   Screening detected right breast mass 2.2 cm by ultrasound upper outer quadrant biopsy: Grade 2-3 IDC with DCIS ER 60%, PR 20%, Ki-67 15%, HER-2 +3+   08/15/2020 Cancer Staging   Staging form: Breast, AJCC 8th Edition - Clinical stage from 08/15/2020: Stage IB (cT2, cN0, cM0, G3, ER+, PR+, HER2+) - Signed by Nicholas Lose, MD on 08/15/2020  Stage prefix: Initial diagnosis    08/27/2020 -  Chemotherapy    Patient is on Treatment Plan: BREAST PACLITAXEL + TRASTUZUMAB Q7D / TRASTUZUMAB Q21D         CHIEF COMPLIANT: Cycle 5 Taxol Herceptin  INTERVAL HISTORY: Rachel Gaines is a 65 y.o. with above-mentioned history of right breast cancer currently on neoadjuvant chemotherapy with Taxol Herceptin. MRI Right Breast biopsy on 11/27/20 showed no malignancy in 5 mm mass in anterior upper outer right breast. She presents to the clinic today for cycle 5.   ALLERGIES:  is allergic to penicillins.  MEDICATIONS:  Current Outpatient Medications  Medication Sig Dispense Refill   albuterol (VENTOLIN HFA) 108 (90 Base) MCG/ACT inhaler INHALE 1 TO 2 PUFFS BY MOUTH INTO THE LUNGS EVERY 4 TO 6 HOURS AS NEEDED FOR SHORTNESS OF BREATH/WHEEZING/COUGH URGENCY (Patient not taking: No sig reported) 18 g 0   benzonatate (TESSALON) 200 MG capsule TAKE 1 CAPSULE BY MOUTH 3 TIMES DAILY AS NEEDED (Patient not taking: No sig reported) 30 capsule 0    Biotin 5 MG CAPS Take 5 mg by mouth daily.     Cholecalciferol (VITAMIN D3) 125 MCG (5000 UT) CAPS Take 5,000 Units by mouth daily.     Continuous Blood Gluc Sensor (FREESTYLE LIBRE 2 SENSOR) MISC Change every 14 days to monitor blood glucose continuosly     Continuous Blood Gluc Sensor (FREESTYLE LIBRE 2 SENSOR) MISC Change every 14 days to monitor blood sugar as directed 9 each 3   diclofenac Sodium (VOLTAREN) 1 % GEL Apply 2 g topically every evening.     diphenoxylate-atropine (LOMOTIL) 2.5-0.025 MG tablet TAKE 1 TABLET BY MOUTH 4 (FOUR) TIMES DAILY AS NEEDED FOR DIARRHEA OR LOOSE STOOLS. (Patient taking differently: Take 2 tablets by mouth 4 (four) times daily as needed for diarrhea or loose stools.) 30 tablet 0   diphenoxylate-atropine (LOMOTIL) 2.5-0.025 MG tablet Take 1 tablet by mouth 4 (four) times daily as needed for diarrhea  or loose stools 30 tablet 0   empagliflozin (JARDIANCE) 10 MG TABS tablet TAKE 1 TABLET BY MOUTH ONCE A DAY 30 tablet 3   FREESTYLE LITE test strip      gabapentin (NEURONTIN) 300 MG capsule Take 300 mg by mouth at bedtime.     insulin detemir (LEVEMIR) 100 UNIT/ML FlexPen INJECT 34 UNITS SUBCUTANEOUSLY TWICE DAILY (Patient not taking: No sig reported) 15 mL 3   Insulin Glargine (BASAGLAR KWIKPEN) 100 UNIT/ML INJECT 34 UNITS UNDER THE SKIN TWICE DAILY (Patient not taking: Reported on 12/05/2020) 45 mL 3  Insulin Glargine-yfgn 100 UNIT/ML SOPN INJECT 34 UNITS SUBCUTANEOUSLY TWICE DAILY (Patient taking differently: Inject 34 Units into the skin in the morning and at bedtime.) 15 mL 3   insulin lispro (HUMALOG) 100 UNIT/ML KwikPen Inject 14 Units into the skin 3 (three) times daily with meals. 45 mL 3   lidocaine-prilocaine (EMLA) cream APPLY 1 APPLICATION TOPICALLY AS NEEDED. (Patient taking differently: Apply 1 application topically daily as needed (port access).) 30 g 0   NONFORMULARY OR COMPOUNDED ITEM [facility]:  Antiinflammatory cream - Diclofenac 3%,  Baclofen 2%, Cyclobenzaprine 2%, Lidocaine 2%, dispense 120grams, apply 1-2 grams to affected area 3-4 times a day, +2refills. (Patient not taking: No sig reported) 120 each 2   ondansetron (ZOFRAN) 8 MG tablet TAKE 1 TABLET (8 MG TOTAL) BY MOUTH EVERY 8 (EIGHT) HOURS AS NEEDED FOR NAUSEA OR VOMITING. (Patient taking differently: Take 8 mg by mouth every 8 (eight) hours as needed for vomiting or nausea.) 30 tablet 0   Pancrelipase, Lip-Prot-Amyl, (ZENPEP) 40000-126000 units CPEP Take 1 capsule by mouth in the morning and at bedtime. (Patient taking differently: Take 1 capsule by mouth 3 (three) times daily with meals.) 120 capsule    Pancrelipase, Lip-Prot-Amyl, 40000-126000 units CPEP TAKE 2 CAPSULES BY MOUTH WITH MEALS AND 1 CAPSULE WITH SNACKS (Patient not taking: No sig reported) 240 capsule 12   potassium chloride SA (KLOR-CON) 20 MEQ tablet TAKE 1 TABLET (20 MEQ TOTAL) BY MOUTH DAILY. (Patient not taking: No sig reported) 30 tablet 0   prochlorperazine (COMPAZINE) 10 MG tablet TAKE 1 TABLET (10 MG TOTAL) BY MOUTH EVERY 6 (SIX) HOURS AS NEEDED FOR NAUSEA OR VOMITING. (Patient taking differently: Take 10 mg by mouth every 6 (six) hours as needed for vomiting or nausea.) 30 tablet 0   ramipril (ALTACE) 2.5 MG capsule TAKE 1 CAPSULE BY MOUTH EVERY OTHER DAY (Patient taking differently: Take 2.5 mg by mouth every other day.) 45 capsule 3   rosuvastatin (CRESTOR) 40 MG tablet TAKE 1 TABLET BY MOUTH ONCE DAILY (Patient taking differently: Take 40 mg by mouth every evening.) 90 tablet 2   sitaGLIPtin (JANUVIA) 100 MG tablet TAKE 1 TABLET BY MOUTH ONCE DAILY (Patient taking differently: Take 100 mg by mouth daily.) 30 tablet 5   traMADol (ULTRAM) 50 MG tablet TAKE 1 TABLET (50 MG TOTAL) BY MOUTH EVERY 6 (SIX) HOURS AS NEEDED. (Patient not taking: No sig reported) 10 tablet 0   UNIFINE PENTIPS 31G X 8 MM MISC USE WITH RX OF LEVEMIR AND HUMALOG FOUR TIMES DAILY.     UNIFINE PENTIPS 32G X 4 MM MISC USE WITH  LEVEMIR AND HUMALOG 4X A DAY DX-E11.65     valACYclovir (VALTREX) 500 MG tablet Take 1 tablet (500 mg total) by mouth 2 (two) times daily. Take for 3 days for outbreak (Patient not taking: No sig reported) 30 tablet 2   No current facility-administered medications for this visit.   Facility-Administered Medications Ordered in Other Visits  Medication Dose Route Frequency Provider Last Rate Last Admin   sodium chloride flush (NS) 0.9 % injection 10 mL  10 mL Intracatheter PRN Nicholas Lose, MD   10 mL at 12/12/20 1354    PHYSICAL EXAMINATION: ECOG PERFORMANCE STATUS: 1 - Symptomatic but completely ambulatory  Vitals:   12/12/20 1117  BP: 138/64  Pulse: 75  Resp: 18  Temp: 97.6 F (36.4 C)  SpO2: 98%   Filed Weights   12/12/20 1117  Weight: 154 lb 1.6 oz (69.9 kg)  LABORATORY DATA:  I have reviewed the data as listed CMP Latest Ref Rng & Units 12/12/2020 12/09/2020 11/14/2020  Glucose 70 - 99 mg/dL 102(H) 326(H) 100(H)  BUN 8 - 23 mg/dL 7(L) 5(L) 4(L)  Creatinine 0.44 - 1.00 mg/dL 0.59 0.69 0.68  Sodium 135 - 145 mmol/L 144 136 145  Potassium 3.5 - 5.1 mmol/L 3.5 3.5 3.7  Chloride 98 - 111 mmol/L 109 102 108  CO2 22 - 32 mmol/L '29 28 30  ' Calcium 8.9 - 10.3 mg/dL 9.4 9.5 9.4  Total Protein 6.5 - 8.1 g/dL 7.5 - 6.2(L)  Total Bilirubin 0.3 - 1.2 mg/dL 0.8 - 1.1  Alkaline Phos 38 - 126 U/L 81 - 80  AST 15 - 41 U/L 16 - 23  ALT 0 - 44 U/L 13 - 19    Lab Results  Component Value Date   WBC 7.9 12/12/2020   HGB 12.6 12/12/2020   HCT 37.7 12/12/2020   MCV 83.6 12/12/2020   PLT 258 12/12/2020   NEUTROABS 4.4 12/12/2020    ASSESSMENT & PLAN:  Malignant neoplasm of upper-outer quadrant of right breast in female, estrogen receptor positive ([place]) 08/08/2020:Screening detected right breast mass 2.2 cm by ultrasound upper outer quadrant biopsy: Grade 2-3 IDC with DCIS ER 60%, PR 20%, Ki-67 15%, HER-2 +3+ T2N0 stage Ia Treatment plan: 1.  Neoadjuvant chemotherapy with Taxol  and Herceptin weekly x12 followed by Herceptin maintenance 2. breast conserving surgery with some [place] biopsy 3.  Adjuvant radiation 4.  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 Diarrhea: Diarrhea is under good control 2. Mild fatigue 3. Hypokalemia-likely secondary to diarrhea.  On oral potassium 20 meq daily 4. Decreased appetite/weight loss 5. Peripheral Neuropathy: taxol dose reduced.  Stable symptoms.   MRI breast 11/17/2020: Right breast malignancy 2.2 cm, 5 mm FA (MRI biopsy: Benign fat necrosis)   She will continue Herceptin every 3 weeks after that.      No orders of the defined types were placed in this encounter.  The patient has a good understanding of the overall plan. she agrees with it. she will call with any problems that may develop before the next visit here.  Total time spent: 30 mins including face to face time and time spent for planning, charting and coordination of care  Rulon Eisenmenger, MD, MPH 12/12/2020  I, Thana Ates, am acting as scribe for Dr. Nicholas Lose.  I have reviewed the above documentation for accuracy and completeness, and I agree with the above.

## 2020-12-12 ENCOUNTER — Telehealth: Payer: Self-pay

## 2020-12-12 ENCOUNTER — Inpatient Hospital Stay: Payer: 59 | Attending: Hematology and Oncology

## 2020-12-12 ENCOUNTER — Inpatient Hospital Stay: Payer: 59 | Admitting: Dietician

## 2020-12-12 ENCOUNTER — Inpatient Hospital Stay: Payer: 59

## 2020-12-12 ENCOUNTER — Other Ambulatory Visit: Payer: Self-pay

## 2020-12-12 ENCOUNTER — Other Ambulatory Visit (HOSPITAL_COMMUNITY): Payer: Self-pay

## 2020-12-12 ENCOUNTER — Inpatient Hospital Stay (HOSPITAL_BASED_OUTPATIENT_CLINIC_OR_DEPARTMENT_OTHER): Payer: 59 | Admitting: Hematology and Oncology

## 2020-12-12 DIAGNOSIS — R5383 Other fatigue: Secondary | ICD-10-CM | POA: Insufficient documentation

## 2020-12-12 DIAGNOSIS — Z801 Family history of malignant neoplasm of trachea, bronchus and lung: Secondary | ICD-10-CM | POA: Diagnosis not present

## 2020-12-12 DIAGNOSIS — C50411 Malignant neoplasm of upper-outer quadrant of right female breast: Secondary | ICD-10-CM | POA: Diagnosis not present

## 2020-12-12 DIAGNOSIS — E876 Hypokalemia: Secondary | ICD-10-CM | POA: Diagnosis not present

## 2020-12-12 DIAGNOSIS — T451X5D Adverse effect of antineoplastic and immunosuppressive drugs, subsequent encounter: Secondary | ICD-10-CM | POA: Insufficient documentation

## 2020-12-12 DIAGNOSIS — Z95828 Presence of other vascular implants and grafts: Secondary | ICD-10-CM

## 2020-12-12 DIAGNOSIS — Z5112 Encounter for antineoplastic immunotherapy: Secondary | ICD-10-CM | POA: Diagnosis not present

## 2020-12-12 DIAGNOSIS — G620 Drug-induced polyneuropathy: Secondary | ICD-10-CM | POA: Diagnosis not present

## 2020-12-12 DIAGNOSIS — Z880 Allergy status to penicillin: Secondary | ICD-10-CM | POA: Insufficient documentation

## 2020-12-12 DIAGNOSIS — R634 Abnormal weight loss: Secondary | ICD-10-CM | POA: Insufficient documentation

## 2020-12-12 DIAGNOSIS — R197 Diarrhea, unspecified: Secondary | ICD-10-CM | POA: Insufficient documentation

## 2020-12-12 DIAGNOSIS — Z170 Estrogen receptor positive status [ER+]: Secondary | ICD-10-CM

## 2020-12-12 LAB — CBC WITH DIFFERENTIAL (CANCER CENTER ONLY)
Abs Immature Granulocytes: 0.03 10*3/uL (ref 0.00–0.07)
Basophils Absolute: 0.1 10*3/uL (ref 0.0–0.1)
Basophils Relative: 1 %
Eosinophils Absolute: 0.2 10*3/uL (ref 0.0–0.5)
Eosinophils Relative: 3 %
HCT: 37.7 % (ref 36.0–46.0)
Hemoglobin: 12.6 g/dL (ref 12.0–15.0)
Immature Granulocytes: 0 %
Lymphocytes Relative: 34 %
Lymphs Abs: 2.6 10*3/uL (ref 0.7–4.0)
MCH: 27.9 pg (ref 26.0–34.0)
MCHC: 33.4 g/dL (ref 30.0–36.0)
MCV: 83.6 fL (ref 80.0–100.0)
Monocytes Absolute: 0.5 10*3/uL (ref 0.1–1.0)
Monocytes Relative: 7 %
Neutro Abs: 4.4 10*3/uL (ref 1.7–7.7)
Neutrophils Relative %: 55 %
Platelet Count: 258 10*3/uL (ref 150–400)
RBC: 4.51 MIL/uL (ref 3.87–5.11)
RDW: 15.7 % — ABNORMAL HIGH (ref 11.5–15.5)
WBC Count: 7.9 10*3/uL (ref 4.0–10.5)
nRBC: 0 % (ref 0.0–0.2)

## 2020-12-12 LAB — CMP (CANCER CENTER ONLY)
ALT: 13 U/L (ref 0–44)
AST: 16 U/L (ref 15–41)
Albumin: 3.6 g/dL (ref 3.5–5.0)
Alkaline Phosphatase: 81 U/L (ref 38–126)
Anion gap: 6 (ref 5–15)
BUN: 7 mg/dL — ABNORMAL LOW (ref 8–23)
CO2: 29 mmol/L (ref 22–32)
Calcium: 9.4 mg/dL (ref 8.9–10.3)
Chloride: 109 mmol/L (ref 98–111)
Creatinine: 0.59 mg/dL (ref 0.44–1.00)
GFR, Estimated: >60 mL/min (ref >60)
Glucose, Bld: 102 mg/dL — ABNORMAL HIGH (ref 70–99)
Potassium: 3.5 mmol/L (ref 3.5–5.1)
Sodium: 144 mmol/L (ref 135–145)
Total Bilirubin: 0.8 mg/dL (ref 0.3–1.2)
Total Protein: 7.5 g/dL (ref 6.5–8.1)

## 2020-12-12 MED ORDER — SODIUM CHLORIDE 0.9 % IV SOLN
Freq: Once | INTRAVENOUS | Status: AC
  Filled 2020-12-12: qty 250

## 2020-12-12 MED ORDER — HEPARIN SOD (PORK) LOCK FLUSH 100 UNIT/ML IV SOLN
500.0000 [IU] | Freq: Once | INTRAVENOUS | Status: AC | PRN
  Administered 2020-12-12: 500 [IU]
  Filled 2020-12-12: qty 5

## 2020-12-12 MED ORDER — ACETAMINOPHEN 325 MG PO TABS
650.0000 mg | ORAL_TABLET | Freq: Once | ORAL | Status: AC
  Administered 2020-12-12: 650 mg via ORAL

## 2020-12-12 MED ORDER — DIPHENHYDRAMINE HCL 25 MG PO CAPS
50.0000 mg | ORAL_CAPSULE | Freq: Once | ORAL | Status: AC
Start: 2020-12-12 — End: 2020-12-12
  Administered 2020-12-12: 50 mg via ORAL

## 2020-12-12 MED ORDER — DIPHENHYDRAMINE HCL 25 MG PO CAPS
ORAL_CAPSULE | ORAL | Status: AC
  Filled 2020-12-12: qty 2

## 2020-12-12 MED ORDER — ACETAMINOPHEN 325 MG PO TABS
ORAL_TABLET | ORAL | Status: AC
  Filled 2020-12-12: qty 2

## 2020-12-12 MED ORDER — SODIUM CHLORIDE 0.9% FLUSH
10.0000 mL | INTRAVENOUS | Status: DC | PRN
Start: 2020-12-12 — End: 2020-12-12
  Administered 2020-12-12: 10 mL
  Filled 2020-12-12: qty 10

## 2020-12-12 MED ORDER — SODIUM CHLORIDE 0.9% FLUSH
10.0000 mL | Freq: Once | INTRAVENOUS | Status: AC
Start: 2020-12-12 — End: 2020-12-12
  Administered 2020-12-12: 10 mL
  Filled 2020-12-12: qty 10

## 2020-12-12 MED ORDER — TRASTUZUMAB-ANNS CHEMO 150 MG IV SOLR
450.0000 mg | Freq: Once | INTRAVENOUS | Status: AC
  Administered 2020-12-12: 450 mg via INTRAVENOUS
  Filled 2020-12-12: qty 21.43

## 2020-12-12 MED ORDER — DIPHENOXYLATE-ATROPINE 2.5-0.025 MG PO TABS
1.0000 | ORAL_TABLET | Freq: Four times a day (QID) | ORAL | 0 refills | Status: DC | PRN
  Filled 2020-12-12: qty 30, 8d supply, fill #0

## 2020-12-12 NOTE — Anesthesia Preprocedure Evaluation (Addendum)
Anesthesia Evaluation  Patient identified by MRN, date of birth, ID band Patient awake    Reviewed: Allergy & Precautions, NPO status , Patient's Chart, lab work & pertinent test results  Airway Mallampati: II  TM Distance: >3 FB Neck ROM: Full    Dental no notable dental hx.    Pulmonary    Pulmonary exam normal breath sounds clear to auscultation       Cardiovascular Exercise Tolerance: Good negative cardio ROS Normal cardiovascular exam Rhythm:Regular Rate:Normal   Echo 11/26/20: IMPRESSIONS  1. Left ventricular ejection fraction, by estimation, is 55 to 60%. Left  ventricular ejection fraction by 3D volume is 59 %. The left ventricle has  normal function. The left ventricle has no regional wall motion  abnormalities. Left ventricular diastolic  parameters are consistent with Grade I diastolic dysfunction (impaired  relaxation). The average left ventricular global longitudinal strain is  -20.5 %. The global longitudinal strain is normal.  2. Right ventricular systolic function is normal. The right ventricular  size is normal. There is normal pulmonary artery systolic pressure. The  estimated right ventricular systolic pressure is 45.8 mmHg.  3. The mitral valve is normal in structure. Trivial mitral valve  regurgitation. No evidence of mitral stenosis.  4. The aortic valve is tricuspid. Aortic valve regurgitation is not  visualized. No aortic stenosis is present.  5. The inferior vena cava is normal in size with greater than 50%  respiratory variability, suggesting right atrial pressure of 3 mmHg.  - Comparison(s): Compared to prior study on 08/22/20, there is no  significant change. GLS remains normal at -20.5 (previously -20.2).   Neuro/Psych  Headaches, PSYCHIATRIC DISORDERS Depression    GI/Hepatic negative GI ROS, Neg liver ROS,   Endo/Other  diabetes  Renal/GU negative Renal ROS  negative genitourinary    Musculoskeletal negative musculoskeletal ROS (+)   Abdominal   Peds negative pediatric ROS (+)  Hematology  (+) anemia ,   Anesthesia Other Findings Right breast cancer  Reproductive/Obstetrics negative OB ROS                                                            Anesthesia Evaluation  Patient identified by MRN, date of birth, ID band Patient awake    Reviewed: Allergy & Precautions, NPO status , Patient's Chart, lab work & pertinent test results  Airway Mallampati: II  TM Distance: >3 FB Neck ROM: Full    Dental no notable dental hx. (+) Dental Advisory Given, Upper Dentures   Pulmonary    Pulmonary exam normal breath sounds clear to auscultation       Cardiovascular Exercise Tolerance: Good Normal cardiovascular exam Rhythm:Regular Rate:Normal     Neuro/Psych  Headaches, PSYCHIATRIC DISORDERS Depression  Neuromuscular disease    GI/Hepatic negative GI ROS, Neg liver ROS,   Endo/Other  diabetes, Well Controlled, Type 1  Renal/GU negative Renal ROS     Musculoskeletal negative musculoskeletal ROS (+)   Abdominal   Peds  Hematology  (+) anemia ,   Anesthesia Other Findings Breast ca  Reproductive/Obstetrics                           Anesthesia Physical Anesthesia Plan  ASA: III  Anesthesia Plan: General   Post-op Pain  Management:    Induction: Intravenous  PONV Risk Score and Plan: 4 or greater and Treatment may vary due to age or medical condition, Midazolam, Dexamethasone and Ondansetron  Airway Management Planned: LMA  Additional Equipment: None  Intra-op Plan:   Post-operative Plan:   Informed Consent: I have reviewed the patients History and Physical, chart, labs and discussed the procedure including the risks, benefits and alternatives for the proposed anesthesia with the patient or authorized representative who has indicated his/her understanding and acceptance.      Dental advisory given  Plan Discussed with: CRNA and Anesthesiologist  Anesthesia Plan Comments:        Anesthesia Quick Evaluation  Anesthesia Physical Anesthesia Plan  ASA: 3  Anesthesia Plan: General and Regional   Post-op Pain Management: GA combined w/ Regional for post-op pain   Induction: Intravenous  PONV Risk Score and Plan: 3 and Treatment may vary due to age or medical condition, Midazolam, Ondansetron, Dexamethasone and Scopolamine patch - Pre-op  Airway Management Planned: LMA  Additional Equipment: None  Intra-op Plan:   Post-operative Plan: Extubation in OR  Informed Consent: I have reviewed the patients History and Physical, chart, labs and discussed the procedure including the risks, benefits and alternatives for the proposed anesthesia with the patient or authorized representative who has indicated his/her understanding and acceptance.       Plan Discussed with:   Anesthesia Plan Comments: (PEC block with Exparel. GA/LMA. Norton Blizzard, MD  )       Anesthesia Quick Evaluation

## 2020-12-12 NOTE — Progress Notes (Signed)
Nutrition Follow-up:  Patient with breast cancer. She is receiving neoadjuvant Taxol and Herceptin. Patient scheduled for right lumpectomy with Dr. Donne Hazel on 6/23.   Patient sleeping soundly in infusion this afternoon. She easily awakes with name call, reports feeling very tired this week. Patient reports her appetite comes and goes. She had some crackers this morning before treatment. Yesterday she ate a sausage biscuit and 1/2 of a [employer] salad for dinner. She reports diarrhea has improved. Patient is taking pancreatic enzymes before meals.   Medications: Compazine, Zofran, Pancrelipase, Lomotil  Labs: Glucose 102, BUN 7  Anthropometrics: Weight 154 lb 1.6 oz today stable  6/14 - 155 lb 8 oz 5/27 - 156 lb 12 oz 5/13 - 155 lb 4 oz   NUTRITION DIAGNOSIS: Unintentional weight loss stable    INTERVENTION:  Educated on importance of adequate calories and protein to support post operative healing  Encouraged small frequent meals and snacks with focus on protein Patient has tried all supplements and does not like them Continue taking anti-diarrhea medications as prescribed    MONITORING, EVALUATION, GOAL: weight trends, intake   NEXT VISIT: To be scheduled

## 2020-12-12 NOTE — Patient Instructions (Signed)
[facility]COL[facility]  Discharge Instructions: ?Thank you for choosing [facility] to provide your oncology and hematology care.  ? ?If you have a lab appointment with the Cancer Center, please go directly to the Cancer Center and check in at the registration area. ?  ?Wear comfortable clothing and clothing appropriate for easy access to any Portacath or PICC line.  ? ?We strive to give you quality time with your provider. You may need to reschedule your appointment if you arrive late (15 or more minutes).  Arriving late affects you and other patients whose appointments are after yours.  Also, if you miss three or more appointments without notifying the office, you may be dismissed from the clinic at the provider?s discretion.    ?  ?For prescription refill requests, have your pharmacy contact our office and allow 72 hours for refills to be completed.   ? ?Today you received the following chemotherapy and/or immunotherapy agents: Trastuzumab    ?  ?To help prevent nausea and vomiting after your treatment, we encourage you to take your nausea medication as directed. ? ?BELOW ARE SYMPTOMS THAT SHOULD BE REPORTED IMMEDIATELY: ?*FEVER GREATER THAN 100.4 F (38 ?C) OR HIGHER ?*CHILLS OR SWEATING ?*NAUSEA AND VOMITING THAT IS NOT CONTROLLED WITH YOUR NAUSEA MEDICATION ?*UNUSUAL SHORTNESS OF BREATH ?*UNUSUAL BRUISING OR BLEEDING ?*URINARY PROBLEMS (pain or burning when urinating, or frequent urination) ?*BOWEL PROBLEMS (unusual diarrhea, constipation, pain near the anus) ?TENDERNESS IN MOUTH AND THROAT WITH OR WITHOUT PRESENCE OF ULCERS (sore throat, sores in mouth, or a toothache) ?UNUSUAL RASH, SWELLING OR PAIN  ?UNUSUAL VAGINAL DISCHARGE OR ITCHING  ? ?Items with * indicate a potential emergency and should be followed up as soon as possible or go to the Emergency Department if any problems should occur. ? ?Please show the CHEMOTHERAPY ALERT CARD or IMMUNOTHERAPY ALERT CARD at check-in  to the Emergency Department and triage nurse. ? ?Should you have questions after your visit or need to cancel or reschedule your appointment, please contact [facility]  Dept: [phone]  and follow the prompts.  Office hours are 8:00 a.m. to 4:30 p.m. Monday - Friday. Please note that voicemails left after 4:00 p.m. may not be returned until the following business day.  We are closed weekends and major holidays. You have access to a nurse at all times for urgent questions. Please call the main number to the clinic Dept: [phone] and follow the prompts. ? ? ?For any non-urgent questions, you may also contact your provider using MyChart. We now offer e-Visits for anyone 18 and older to request care online for non-urgent symptoms. For details visit [email or web address]. ?  ?Also download the MyChart app! Go to the app store, search "MyChart", open the app, select [facility], and log in with your MyChart username and password. ? ?Due to Covid, a mask is required upon entering the hospital/clinic. If you do not have a mask, one will be given to you upon arrival. For doctor visits, patients may have 1 support person aged 18 or older with them. For treatment visits, patients cannot have anyone with them due to current Covid guidelines and our immunocompromised population.  ? ?

## 2020-12-12 NOTE — Telephone Encounter (Signed)
Pt requested refill for Lomotil while in infusion today. Refill request fulfilled at [facility]. This LPN attempted to call pt, no answer, VM box full.

## 2020-12-17 ENCOUNTER — Ambulatory Visit
Admission: RE | Admit: 2020-12-17 | Discharge: 2020-12-17 | Disposition: A | Discharge status: Home / Self Care | Payer: 59 | Source: Ambulatory Visit | Attending: General Surgery | Admitting: General Surgery

## 2020-12-17 ENCOUNTER — Other Ambulatory Visit: Payer: Self-pay

## 2020-12-17 ENCOUNTER — Telehealth: Payer: Self-pay | Admitting: Hematology and Oncology

## 2020-12-17 DIAGNOSIS — C50411 Malignant neoplasm of upper-outer quadrant of right female breast: Secondary | ICD-10-CM | POA: Diagnosis not present

## 2020-12-17 DIAGNOSIS — Z170 Estrogen receptor positive status [ER+]: Secondary | ICD-10-CM

## 2020-12-17 NOTE — Telephone Encounter (Signed)
Scheduled per 6/17 los. Called and spoke with pt confirmed added appts

## 2020-12-18 ENCOUNTER — Ambulatory Visit
Admission: RE | Admit: 2020-12-18 | Discharge: 2020-12-18 | Disposition: A | Discharge status: Home / Self Care | Payer: 59 | Source: Ambulatory Visit | Attending: General Surgery | Admitting: General Surgery

## 2020-12-18 ENCOUNTER — Ambulatory Visit (HOSPITAL_COMMUNITY)
Admission: RE | Admit: 2020-12-18 | Discharge: 2020-12-18 | Disposition: A | Discharge status: Home / Self Care | Payer: 59 | Source: Ambulatory Visit | Attending: General Surgery | Admitting: General Surgery

## 2020-12-18 ENCOUNTER — Ambulatory Visit (HOSPITAL_COMMUNITY): Payer: 59 | Admitting: Certified Registered"

## 2020-12-18 ENCOUNTER — Ambulatory Visit (HOSPITAL_COMMUNITY): Payer: 59 | Admitting: Vascular Surgery

## 2020-12-18 ENCOUNTER — Encounter (HOSPITAL_COMMUNITY)
Admission: RE | Disposition: A | Discharge status: Home / Self Care | Payer: Self-pay | Source: Ambulatory Visit | Attending: General Surgery

## 2020-12-18 ENCOUNTER — Other Ambulatory Visit (HOSPITAL_COMMUNITY): Payer: Self-pay

## 2020-12-18 ENCOUNTER — Encounter (HOSPITAL_COMMUNITY): Payer: Self-pay | Admitting: General Surgery

## 2020-12-18 DIAGNOSIS — Z170 Estrogen receptor positive status [ER+]: Secondary | ICD-10-CM | POA: Diagnosis not present

## 2020-12-18 DIAGNOSIS — R928 Other abnormal and inconclusive findings on diagnostic imaging of breast: Secondary | ICD-10-CM | POA: Diagnosis not present

## 2020-12-18 DIAGNOSIS — Z7984 Long term (current) use of oral hypoglycemic drugs: Secondary | ICD-10-CM | POA: Insufficient documentation

## 2020-12-18 DIAGNOSIS — C50411 Malignant neoplasm of upper-outer quadrant of right female breast: Secondary | ICD-10-CM

## 2020-12-18 DIAGNOSIS — Z791 Long term (current) use of non-steroidal anti-inflammatories (NSAID): Secondary | ICD-10-CM | POA: Insufficient documentation

## 2020-12-18 DIAGNOSIS — E876 Hypokalemia: Secondary | ICD-10-CM | POA: Diagnosis not present

## 2020-12-18 DIAGNOSIS — Z9221 Personal history of antineoplastic chemotherapy: Secondary | ICD-10-CM | POA: Insufficient documentation

## 2020-12-18 DIAGNOSIS — Z79899 Other long term (current) drug therapy: Secondary | ICD-10-CM | POA: Insufficient documentation

## 2020-12-18 DIAGNOSIS — G8918 Other acute postprocedural pain: Secondary | ICD-10-CM | POA: Diagnosis not present

## 2020-12-18 DIAGNOSIS — Z794 Long term (current) use of insulin: Secondary | ICD-10-CM | POA: Diagnosis not present

## 2020-12-18 DIAGNOSIS — C50911 Malignant neoplasm of unspecified site of right female breast: Secondary | ICD-10-CM | POA: Diagnosis not present

## 2020-12-18 DIAGNOSIS — N6081 Other benign mammary dysplasias of right breast: Secondary | ICD-10-CM | POA: Diagnosis not present

## 2020-12-18 DIAGNOSIS — E119 Type 2 diabetes mellitus without complications: Secondary | ICD-10-CM | POA: Diagnosis not present

## 2020-12-18 HISTORY — PX: BREAST LUMPECTOMY WITH RADIOACTIVE SEED AND SENTINEL LYMPH NODE BIOPSY: SHX6550

## 2020-12-18 HISTORY — PX: BREAST LUMPECTOMY: SHX2

## 2020-12-18 LAB — GLUCOSE, CAPILLARY
Glucose-Capillary: 94 mg/dL (ref 70–99)
Glucose-Capillary: 82 mg/dL (ref 70–99)

## 2020-12-18 SURGERY — BREAST LUMPECTOMY WITH RADIOACTIVE SEED AND SENTINEL LYMPH NODE BIOPSY
Anesthesia: Regional | Site: Breast | Laterality: Right

## 2020-12-18 MED ORDER — SODIUM CHLORIDE (PF) 0.9 % IJ SOLN
INTRAVENOUS | Status: DC | PRN
  Administered 2020-12-18: 4 mL via INTRAMUSCULAR

## 2020-12-18 MED ORDER — OXYCODONE HCL 5 MG PO TABS
5.0000 mg | ORAL_TABLET | Freq: Four times a day (QID) | ORAL | 0 refills | Status: DC | PRN
  Filled 2020-12-18: qty 10, 3d supply, fill #0

## 2020-12-18 MED ORDER — OXYCODONE HCL 5 MG PO TABS
ORAL_TABLET | ORAL | Status: AC
  Filled 2020-12-18: qty 1

## 2020-12-18 MED ORDER — VANCOMYCIN HCL IN DEXTROSE 1-5 GM/200ML-% IV SOLN
INTRAVENOUS | Status: AC
  Administered 2020-12-18: 1000 mg via INTRAVENOUS
  Filled 2020-12-18: qty 200

## 2020-12-18 MED ORDER — PROMETHAZINE HCL 25 MG/ML IJ SOLN: 6.2500 mg | INTRAMUSCULAR | Status: DC | PRN

## 2020-12-18 MED ORDER — ACETAMINOPHEN 500 MG PO TABS
1000.0000 mg | ORAL_TABLET | ORAL | Status: AC
  Administered 2020-12-18: 1000 mg via ORAL
  Filled 2020-12-18: qty 2

## 2020-12-18 MED ORDER — PHENYLEPHRINE 40 MCG/ML (10ML) SYRINGE FOR IV PUSH (FOR BLOOD PRESSURE SUPPORT)
PREFILLED_SYRINGE | INTRAVENOUS | Status: AC
  Filled 2020-12-18: qty 10

## 2020-12-18 MED ORDER — MIDAZOLAM HCL 2 MG/2ML IJ SOLN: 1.0000 mg | Freq: Once | INTRAMUSCULAR | Status: AC

## 2020-12-18 MED ORDER — PROPOFOL 10 MG/ML IV BOLUS
INTRAVENOUS | Status: AC
  Filled 2020-12-18: qty 20

## 2020-12-18 MED ORDER — DEXAMETHASONE SODIUM PHOSPHATE 10 MG/ML IJ SOLN
INTRAMUSCULAR | Status: DC | PRN
  Administered 2020-12-18: 4 mg via INTRAVENOUS

## 2020-12-18 MED ORDER — LIDOCAINE 2% (20 MG/ML) 5 ML SYRINGE
INTRAMUSCULAR | Status: AC
  Filled 2020-12-18: qty 5

## 2020-12-18 MED ORDER — LIDOCAINE 2% (20 MG/ML) 5 ML SYRINGE
INTRAMUSCULAR | Status: DC | PRN
  Administered 2020-12-18: 70 mg via INTRAVENOUS

## 2020-12-18 MED ORDER — DEXAMETHASONE SODIUM PHOSPHATE 10 MG/ML IJ SOLN
INTRAMUSCULAR | Status: AC
  Filled 2020-12-18: qty 1

## 2020-12-18 MED ORDER — ENSURE PRE-SURGERY PO LIQD: 296.0000 mL | Freq: Once | ORAL | Status: DC

## 2020-12-18 MED ORDER — BUPIVACAINE HCL (PF) 0.25 % IJ SOLN
INTRAMUSCULAR | Status: AC
  Filled 2020-12-18: qty 30

## 2020-12-18 MED ORDER — FENTANYL CITRATE (PF) 100 MCG/2ML IJ SOLN: 50.0000 ug | Freq: Once | INTRAMUSCULAR | Status: AC

## 2020-12-18 MED ORDER — CHLORHEXIDINE GLUCONATE 0.12 % MT SOLN
15.0000 mL | Freq: Once | OROMUCOSAL | Status: AC
  Administered 2020-12-18: 15 mL via OROMUCOSAL
  Filled 2020-12-18: qty 15

## 2020-12-18 MED ORDER — MIDAZOLAM HCL 2 MG/2ML IJ SOLN
INTRAMUSCULAR | Status: AC
  Administered 2020-12-18: 1 mg via INTRAVENOUS
  Filled 2020-12-18: qty 2

## 2020-12-18 MED ORDER — PROPOFOL 10 MG/ML IV BOLUS
INTRAVENOUS | Status: DC | PRN
  Administered 2020-12-18: 150 mg via INTRAVENOUS

## 2020-12-18 MED ORDER — ORAL CARE MOUTH RINSE: 15.0000 mL | Freq: Once | OROMUCOSAL | Status: AC

## 2020-12-18 MED ORDER — PHENYLEPHRINE 40 MCG/ML (10ML) SYRINGE FOR IV PUSH (FOR BLOOD PRESSURE SUPPORT)
PREFILLED_SYRINGE | INTRAVENOUS | Status: DC | PRN
  Administered 2020-12-18: 80 ug via INTRAVENOUS

## 2020-12-18 MED ORDER — ONDANSETRON HCL 4 MG/2ML IJ SOLN
INTRAMUSCULAR | Status: AC
  Filled 2020-12-18: qty 2

## 2020-12-18 MED ORDER — BUPIVACAINE LIPOSOME 1.3 % IJ SUSP
INTRAMUSCULAR | Status: DC | PRN
  Administered 2020-12-18: 10 mL

## 2020-12-18 MED ORDER — MIDAZOLAM HCL 2 MG/2ML IJ SOLN
1.0000 mg | Freq: Once | INTRAMUSCULAR | Status: AC
  Administered 2020-12-18: 1 mg via INTRAVENOUS

## 2020-12-18 MED ORDER — FENTANYL CITRATE (PF) 250 MCG/5ML IJ SOLN
INTRAMUSCULAR | Status: AC
  Filled 2020-12-18: qty 5

## 2020-12-18 MED ORDER — ONDANSETRON HCL 4 MG/2ML IJ SOLN
INTRAMUSCULAR | Status: DC | PRN
  Administered 2020-12-18: 4 mg via INTRAVENOUS

## 2020-12-18 MED ORDER — LACTATED RINGERS IV SOLN: INTRAVENOUS | Status: DC

## 2020-12-18 MED ORDER — OXYCODONE HCL 5 MG/5ML PO SOLN
5.0000 mg | Freq: Once | ORAL | Status: AC | PRN
Start: 2020-12-18 — End: 2020-12-18

## 2020-12-18 MED ORDER — FENTANYL CITRATE (PF) 100 MCG/2ML IJ SOLN
INTRAMUSCULAR | Status: AC
  Administered 2020-12-18: 50 ug via INTRAVENOUS
  Filled 2020-12-18: qty 2

## 2020-12-18 MED ORDER — BUPIVACAINE HCL (PF) 0.25 % IJ SOLN
INTRAMUSCULAR | Status: DC | PRN
  Administered 2020-12-18: 5 mL

## 2020-12-18 MED ORDER — BUPIVACAINE HCL (PF) 0.5 % IJ SOLN
INTRAMUSCULAR | Status: DC | PRN
  Administered 2020-12-18: 20 mL

## 2020-12-18 MED ORDER — FENTANYL CITRATE (PF) 100 MCG/2ML IJ SOLN
INTRAMUSCULAR | Status: DC | PRN
  Administered 2020-12-18: 50 ug via INTRAVENOUS
  Administered 2020-12-18 (×2): 25 ug via INTRAVENOUS

## 2020-12-18 MED ORDER — VANCOMYCIN HCL IN DEXTROSE 1-5 GM/200ML-% IV SOLN: 1000.0000 mg | INTRAVENOUS | Status: AC

## 2020-12-18 MED ORDER — AMISULPRIDE (ANTIEMETIC) 5 MG/2ML IV SOLN: 10.0000 mg | Freq: Once | INTRAVENOUS | Status: DC | PRN

## 2020-12-18 MED ORDER — OXYCODONE HCL 5 MG PO TABS
5.0000 mg | ORAL_TABLET | Freq: Once | ORAL | Status: AC | PRN
  Administered 2020-12-18: 5 mg via ORAL

## 2020-12-18 SURGICAL SUPPLY — 48 items
ADH SKN CLS APL DERMABOND .7 (GAUZE/BANDAGES/DRESSINGS) ×1
APL PRP STRL LF DISP 70% ISPRP (MISCELLANEOUS) ×1
APPLIER CLIP 9.375 MED OPEN (MISCELLANEOUS) ×2
APR CLP MED 9.3 20 MLT OPN (MISCELLANEOUS) ×1
BINDER BREAST XLRG (GAUZE/BANDAGES/DRESSINGS) ×1 IMPLANT
CANISTER SUCT 3000ML PPV (MISCELLANEOUS) ×2 IMPLANT
CHLORAPREP W/TINT 26 (MISCELLANEOUS) ×2 IMPLANT
CLIP APPLIE 9.375 MED OPEN (MISCELLANEOUS) IMPLANT
COVER PROBE W GEL 5X96 (DRAPES) ×2 IMPLANT
COVER SURGICAL LIGHT HANDLE (MISCELLANEOUS) ×2 IMPLANT
COVER WAND RF STERILE (DRAPES) ×2 IMPLANT
DERMABOND ADVANCED (GAUZE/BANDAGES/DRESSINGS) ×1
DERMABOND ADVANCED .7 DNX12 (GAUZE/BANDAGES/DRESSINGS) ×1 IMPLANT
DEVICE DUBIN SPECIMEN MAMMOGRA (MISCELLANEOUS) ×2 IMPLANT
DRAPE CHEST BREAST 15X10 FENES (DRAPES) ×2 IMPLANT
ELECT COATED BLADE 2.86 ST (ELECTRODE) ×2 IMPLANT
ELECT REM PT RETURN 9FT ADLT (ELECTROSURGICAL) ×2
ELECTRODE REM PT RTRN 9FT ADLT (ELECTROSURGICAL) ×1 IMPLANT
GLOVE SURG ENC MOIS LTX SZ6 (GLOVE) ×2 IMPLANT
GLOVE SURG ENC MOIS LTX SZ7 (GLOVE) ×2 IMPLANT
GLOVE SURG UNDER LTX SZ7 (GLOVE) ×3 IMPLANT
GLOVE SURG UNDER POLY LF SZ7.5 (GLOVE) ×2 IMPLANT
GOWN STRL REUS W/ TWL LRG LVL3 (GOWN DISPOSABLE) ×2 IMPLANT
GOWN STRL REUS W/TWL LRG LVL3 (GOWN DISPOSABLE) ×4
HEMOSTAT ARISTA ABSORB 3G PWDR (HEMOSTASIS) ×1 IMPLANT
KIT BASIN OR (CUSTOM PROCEDURE TRAY) ×2 IMPLANT
KIT MARKER MARGIN INK (KITS) ×2 IMPLANT
NDL 18GX1X1/2 (RX/OR ONLY) (NEEDLE) IMPLANT
NDL FILTER BLUNT 18X1 1/2 (NEEDLE) IMPLANT
NDL HYPO 25GX1X1/2 BEV (NEEDLE) ×1 IMPLANT
NEEDLE 18GX1X1/2 (RX/OR ONLY) (NEEDLE) IMPLANT
NEEDLE FILTER BLUNT 18X 1/2SAF (NEEDLE) ×1
NEEDLE FILTER BLUNT 18X1 1/2 (NEEDLE) ×1 IMPLANT
NEEDLE HYPO 25GX1X1/2 BEV (NEEDLE) ×4 IMPLANT
NS IRRIG 1000ML POUR BTL (IV SOLUTION) ×2 IMPLANT
PACK GENERAL/GYN (CUSTOM PROCEDURE TRAY) ×2 IMPLANT
RETRACTOR ONETRAX LX 90X20 (MISCELLANEOUS) ×1 IMPLANT
STRIP CLOSURE SKIN 1/2X4 (GAUZE/BANDAGES/DRESSINGS) ×2 IMPLANT
SUT MNCRL AB 4-0 PS2 18 (SUTURE) ×3 IMPLANT
SUT MON AB 5-0 PS2 18 (SUTURE) ×1 IMPLANT
SUT SILK 2 0 SH (SUTURE) ×1 IMPLANT
SUT VIC AB 2-0 SH 27 (SUTURE) ×6
SUT VIC AB 2-0 SH 27XBRD (SUTURE) ×2 IMPLANT
SUT VIC AB 3-0 SH 27 (SUTURE) ×4
SUT VIC AB 3-0 SH 27X BRD (SUTURE) ×2 IMPLANT
SYR CONTROL 10ML LL (SYRINGE) ×3 IMPLANT
TOWEL GREEN STERILE (TOWEL DISPOSABLE) ×2 IMPLANT
TOWEL GREEN STERILE FF (TOWEL DISPOSABLE) ×2 IMPLANT

## 2020-12-18 NOTE — Interval H&P Note (Signed)
History and Physical Interval Note:  12/18/2020 10:47 AM  Rachel Gaines  has presented today for surgery, with the diagnosis of RIGHT BREAST CANCER.  The various methods of treatment have been discussed with the patient and family. After consideration of risks, benefits and other options for treatment, the patient has consented to  Procedure(s): RIGHT BREAST LUMPECTOMY WITH RADIOACTIVE SEED AND RIGHT AXILLARY SENTINEL LYMPH NODE BIOPSY (Right) as a surgical intervention.  The patient's history has been reviewed, patient examined, no change in status, stable for surgery.  I have reviewed the patient's chart and labs.  Questions were answered to the patient's satisfaction.     Rolm Bookbinder

## 2020-12-18 NOTE — Transfer of Care (Signed)
Immediate Anesthesia Transfer of Care Note  Patient: Rachel Gaines  Procedure(s) Performed: RIGHT BREAST LUMPECTOMY WITH RADIOACTIVE SEED AND RIGHT AXILLARY SENTINEL LYMPH NODE BIOPSY (Right: Breast)  Patient Location: PACU  Anesthesia Type:GA combined with regional for post-op pain  Level of Consciousness: drowsy  Airway & Oxygen Therapy: Patient Spontanous Breathing and Patient connected to face mask oxygen  Post-op Assessment: Report given to RN and Post -op Vital signs reviewed and stable  Post vital signs: Reviewed and stable  Last Vitals:  Vitals Value Taken Time  BP 131/74 12/18/20 1214  Temp    Pulse 83 12/18/20 1216  Resp 18 12/18/20 1216  SpO2 98 % 12/18/20 1216  Vitals shown include unvalidated device data.  Last Pain:  Vitals:   12/18/20 0930  TempSrc:   PainSc: 0-No pain         Complications: No notable events documented.

## 2020-12-18 NOTE — Anesthesia Procedure Notes (Signed)
Anesthesia Regional Block: Pectoralis block   Pre-Anesthetic Checklist: , timeout performed,  Correct Patient, Correct Site, Correct Laterality,  Correct Procedure, Correct Position, site marked,  Risks and benefits discussed,  Surgical consent,  Pre-op evaluation,  At surgeon's request and post-op pain management  Laterality: Right  Prep: chloraprep       Needles:  Injection technique: Single-shot  Needle Type: Echogenic Stimulator Needle     Needle Length: 10cm  Needle Gauge: 20     Additional Needles:   Procedures:, nerve stimulator,,, ultrasound used (permanent image in chart),,    Narrative:  Start time: 12/18/2020 10:20 AM End time: 12/18/2020 10:40 AM Injection made incrementally with aspirations every 5 mL.  Performed by: Personally  Anesthesiologist: Merlinda Frederick, MD  Additional Notes: Functioning IV was confirmed and monitors were applied. Sterile prep and drape,hand hygiene and sterile gloves were used. Ultrasound guidance: relevant anatomy identified, needle position confirmed, local anesthetic spread visualized around nerve(s)., vascular puncture avoided.  Image printed for medical record. Negative aspiration and negative test dose prior to incremental administration of local anesthetic. The patient tolerated the procedure well.

## 2020-12-18 NOTE — Discharge Instructions (Addendum)
[facility] Office Phone Number [phone]  BREAST BIOPSY/ PARTIAL MASTECTOMY: POST OP INSTRUCTIONS Take 400 mg of ibuprofen every 8 hours or 650 mg tylenol every 6 hours for next 72 hours then as needed. Use ice several times daily also. Always review your discharge instruction sheet given to you by the facility where your surgery was performed.  IF YOU HAVE DISABILITY OR FAMILY LEAVE FORMS, YOU MUST BRING THEM TO THE OFFICE FOR PROCESSING.  DO NOT GIVE THEM TO YOUR DOCTOR.  A prescription for pain medication may be given to you upon discharge.  Take your pain medication as prescribed, if needed.  If narcotic pain medicine is not needed, then you may take acetaminophen (Tylenol), naprosyn (Alleve) or ibuprofen (Advil) as needed. Take your usually prescribed medications unless otherwise directed If you need a refill on your pain medication, please contact your pharmacy.  They will contact our office to request authorization.  Prescriptions will not be filled after 5pm or on week-ends. You should eat very light the first 24 hours after surgery, such as soup, crackers, pudding, etc.  Resume your normal diet the day after surgery. Most patients will experience some swelling and bruising in the breast.  Ice packs and a good support bra will help.  Wear the breast binder provided or a sports bra for 72 hours day and night.  After that wear a sports bra during the day until you return to the office. Swelling and bruising can take several days to resolve.  It is common to experience some constipation if taking pain medication after surgery.  Increasing fluid intake and taking a stool softener will usually help or prevent this problem from occurring.  A mild laxative (Milk of Magnesia or Miralax) should be taken according to package directions if there are no bowel movements after 48 hours. Unless discharge instructions indicate otherwise, you may remove your bandages 48 hours after surgery  and you may shower at that time.  You may have steri-strips (small skin tapes) in place directly over the incision.  These strips should be left on the skin for 7-10 days and will come off on their own.  If your surgeon used skin glue on the incision, you may shower in 24 hours.  The glue will flake off over the next 2-3 weeks.  Any sutures or staples will be removed at the office during your follow-up visit. ACTIVITIES:  You may resume regular daily activities (gradually increasing) beginning the next day.  Wearing a good support bra or sports bra minimizes pain and swelling.  You may have sexual intercourse when it is comfortable. You may drive when you no longer are taking prescription pain medication, you can comfortably wear a seatbelt, and you can safely maneuver your car and apply brakes. RETURN TO WORK:  ______________________________________________________________________________________ You should see your doctor in the office for a follow-up appointment approximately two weeks after your surgery.  Your doctor's nurse will typically make your follow-up appointment when she calls you with your pathology report.  Expect your pathology report 3-4 business days after your surgery.  You may call to check if you do not hear from us after three days. OTHER INSTRUCTIONS: _______________________________________________________________________________________________ _____________________________________________________________________________________________________________________________________ _____________________________________________________________________________________________________________________________________ _____________________________________________________________________________________________________________________________________  WHEN TO CALL DR WAKEFIELD: Fever over 101.0 Nausea and/or vomiting. Extreme swelling or bruising. Continued bleeding from incision. Increased  pain, redness, or drainage from the incision.  The clinic staff is available to answer your questions during regular business hours.  Please don't hesitate to call   and ask to speak to one of the nurses for clinical concerns.  If you have a medical emergency, go to the nearest emergency room or call 911.  A surgeon from [facility] is always on call at the hospital.  For further questions, please visit [email or web address]  

## 2020-12-18 NOTE — Op Note (Signed)
Preoperative diagnosis: Right breast cancer s/p primary chemotherapy Postoperative diagnosis: Same as above Procedure: 1.  Right breast radioactive seed guided lumpectomy 2.  Right axillary sentinel lymph node biopsy, deep 3.  Injection of methylene blue dye for sentinel lymph node identification Surgeon: Dr. Serita Grammes Anesthesia: General with a pectoral block Estimated blood loss: 20 cc Complications: None Drains: None Specimens: 1.  Right breast tissue marked with paint 2.  Additional inferior and medial margins marked short superior, long lateral, double deep 3.  Right deep axillary sentinel lymph nodes with highest count of 89 Sponge and count was correct completion Decision to recovery stable condition   Indications:  42 yof who has no prior breast history, no mass or dc had her screening mm. she has b-c density breasts.  she was noted to have a 2.3 cm uoq breast mass in ruoq.  this is 2.2 cm by [place].  [place] axilla by negative.  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  post chemo mri shows a 2.2 cm mass (not really changed much) and a 5 mm anterior upper outer right breast mass that is due to be biopsied and this is now benign.she has done pretty well with chemotherapy.we discussed lumpectomy and sn biopsy.    Procedure: After informed consent was obtained the patient first underwent a pectoral block.  She was also injected with Lymphoseek in the standard periareolar fashion.  She was given antibiotics.  SCDs were placed.  She was then placed under general anesthesia without complication.  She was prepped and draped in standard sterile surgical fashion.  Surgical timeout was then performed.   I injected a mixture of methylene blue and saline in the periareolar fashion as well as in the right upper outer quadrant due to her prior surgery.  I massaged this.  The seed was in the lateral breast.  I infiltrated Marcaine and made  a superior periareolar incision in order to hide the scar later.  I then dissected to the seed was able to identify the seed.  I removed the seed the palpable mass and the surrounding tissue in order to get a clear margin.  I then marked this with paint and passed off the table.  The seed and clip weree present in the specimen.  I then removed additional margins as above as these appeared close to me. I then placed a couple of clips in the cavity.  I then closed the cavity with 2-0 Vicryl.  The skin was closed with 3-0 Vicryl 5-0 Monocryl.  Glue and Steri-Strips were later applied.  I was able to identify radioactivity in the axilla.  I then infiltrated Marcaine and made an incision.  I carried this through the axillary fascia.  I was able to identify what appeared to be a couple of radioactive nodes.   I removed these and passed these off the table.  There is no real background radioactivity.  I obtained hemostasis.  I then closed the axillary fascia with 2-0 Vicryl.  The skin was closed with 3-0 Vicryl and 4-0 Monocryl.  Glue and Steri-Strips were applied.  She tolerated this well was extubated and transferred to recovery stable

## 2020-12-18 NOTE — Anesthesia Procedure Notes (Signed)
Procedure Name: LMA Insertion Date/Time: 12/18/2020 11:08 AM Performed by: Ezequiel Kayser, CRNA Pre-anesthesia Checklist: Patient identified, Emergency Drugs available, Suction available and Patient being monitored Patient Re-evaluated:Patient Re-evaluated prior to induction Oxygen Delivery Method: Circle System Utilized Preoxygenation: Pre-oxygenation with 100% oxygen Induction Type: IV induction Ventilation: Mask ventilation without difficulty LMA: LMA inserted LMA Size: 4.0 Number of attempts: 1 Airway Equipment and Method: Bite block Placement Confirmation: positive ETCO2 Tube secured with: Tape Dental Injury: Teeth and Oropharynx as per pre-operative assessment

## 2020-12-18 NOTE — H&P (Signed)
65 yof who has no prior breast history, no mass or dc had her screening mm. she has b-c density breasts.  she was noted to have a 2.3 cm uoq breast mass in ruoq.  this is 2.2 cm by [place].  [place] axilla by negative.  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  post chemo mri shows a 2.2 cm mass (not really changed much) and a 5 mm anterior upper outer right breast mass that is due to be biopsied and this is now benign.she has done pretty well with chemotherapy.  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   Past Surgical History Rolm Bookbinder, MD; 11/25/2020 11:01 AM) Breast Biopsy   Right. Colon Polyp Removal - Colonoscopy    Allergies Rolm Bookbinder, MD; 11/25/2020 11:01 AM) Penicillins    Medication History Rolm Bookbinder, MD; 11/25/2020 11:01 AM) Albuterol Sulfate HFA  (108 (90 Base)MCG/ACT Aerosol Soln, Inhalation) Active. Basaglar KwikPen  (100UNIT/ML Soln Pen-inj, Subcutaneous) Active. Celecoxib  (100MG  Capsule, Oral) Active. Benzonatate  (200MG  Capsule, Oral) Active. FreeStyle Libre 2 Sensor  Active. Terconazole  (0.4% Cream, Vaginal) Active. Cyclobenzaprine HCl  (5MG  Tablet, Oral) Active. Diclofenac Sodium  (1% Gel, External) Active. Doxycycline Hyclate  (100MG  Tablet, Oral) Active. Fluconazole  (150MG  Tablet, Oral) Active. Gabapentin  (300MG  Capsule, Oral) Active. HumaLOG KwikPen  (100UNIT/ML Soln Pen-inj, Subcutaneous) Active. Januvia  (100MG  Tablet, Oral) Active. Jardiance  (10MG  Tablet, Oral) Active. levoFLOXacin  (500MG  Tablet, Oral) Active. Meloxicam  (7.5MG  Tablet, Oral) Active. methylPREDNISolone  (4MG  Tab Ther Pack, Oral) Active. Ramipril  (2.5MG  Capsule, Oral) Active. Rosuvastatin Calcium  (40MG  Tablet, Oral) Active. Unifine Pentips  (32G X 4 MM Misc,) Active. Unifine Pentips  (31G X 8 MM Misc,) Active. valACYclovir HCl  (1GM Tablet, Oral) Active. valACYclovir HCl  (500MG  Tablet, Oral) Active. Zenpep   (40000-126000 UNIT Capsule DR Part, Oral) Active.  Social History Rolm Bookbinder, MD; 11/25/2020 11:01 AM) Alcohol use   Occasional alcohol use. Caffeine use   Coffee, Tea. No drug use   Tobacco use   Never smoker.  Family History Rolm Bookbinder, MD; 11/25/2020 11:01 AM) Alcohol Abuse   Father. Diabetes Mellitus   Brother, Father. Heart Disease   Brother, Father. Heart disease in female family member before age 37   Hypertension   Sister. Prostate Cancer   Father.  ROS Negative except for HPI  Physical Exam Rolm Bookbinder MD; 11/25/2020 10:54 AM) General Mental Status - Alert. Orientation - Oriented X3. Breast Nipples - No Discharge. Breast Lump - No Palpable Breast Mass. Note:  left chest port Lymphatic Head & Neck General Head & Neck Lymphatics: Bilateral - Description - Normal. Axillary General Axillary Region: Bilateral - Description - Normal. Note:  no [place] adenopathy Cv rrr Lungs clear   Assessment & Plan Rolm Bookbinder MD; 11/25/2020 11:01 AM) BREAST CANCER OF UPPER-OUTER QUADRANT OF RIGHT FEMALE BREAST (C50.411) right breast seed guided lumpectomy, right axillary sn biopsy We discussed a sentinel lymph node biopsy as she does not appear to having lymph node involvement right now. We discussed the performance of that with injection of radioactive tracer and blue dye. We discussed that there is a chance of having a positive node with a sentinel lymph node biopsy and we will await the permanent pathology to make any other first further decisions in terms of her treatment. We discussed up to a 5% risk lifetime of chronic  shoulder pain as well as lymphedema associated with a sentinel lymph node biopsy. We discussed the options for treatment of the breast cancer which included lumpectomy versus a mastectomy. We discussed the performance of the lumpectomy with radioactive seed placement. We discussed a 5-10% chance of a positive margin requiring reexcision in the  operating room. We also discussed that she will need radiation therapy if she undergoes lumpectomy. We discussed mastectomy and the postoperative care for that as well. Mastectomy can be followed by reconstruction. The decision for lumpectomy vs mastectomy has no impact on decision for chemotherapy. Most mastectomy patients will not need radiation therapy. We discussed that there is no difference in her survival whether she undergoes lumpectomy with radiation therapy or antiestrogen therapy versus a mastectomy. There is also no real difference between her recurrence in the breast. We discussed the risks of operation including bleeding, infection, possible reoperation. She understands her further therapy will be based on what her stages at the time of her operation. we had long conversation with her daughter on why mastectomy and bilateral mastectomy not helpful

## 2020-12-18 NOTE — Anesthesia Postprocedure Evaluation (Signed)
Anesthesia Post Note  Patient: Rachel Gaines  Procedure(s) Performed: RIGHT BREAST LUMPECTOMY WITH RADIOACTIVE SEED AND RIGHT AXILLARY SENTINEL LYMPH NODE BIOPSY (Right: Breast)     Patient location during evaluation: PACU Anesthesia Type: Regional and General Level of consciousness: awake Pain management: pain level controlled Vital Signs Assessment: post-procedure vital signs reviewed and stable Respiratory status: spontaneous breathing and respiratory function stable Cardiovascular status: stable Postop Assessment: no apparent nausea or vomiting Anesthetic complications: no   No notable events documented.  Last Vitals:  Vitals:   12/18/20 1245 12/18/20 1300  BP: 129/73 138/77  Pulse: 63 60  Resp: 20 (!) 22  Temp:  (!) 36.3 C  SpO2: 96% 93%    Last Pain:  Vitals:   12/18/20 1215  TempSrc:   PainSc: Asleep                 Merlinda Frederick

## 2020-12-19 ENCOUNTER — Encounter (HOSPITAL_COMMUNITY): Payer: Self-pay | Admitting: General Surgery

## 2020-12-22 ENCOUNTER — Encounter: Payer: Self-pay | Admitting: *Deleted

## 2020-12-22 DIAGNOSIS — C50411 Malignant neoplasm of upper-outer quadrant of right female breast: Secondary | ICD-10-CM

## 2020-12-22 DIAGNOSIS — Z170 Estrogen receptor positive status [ER+]: Secondary | ICD-10-CM

## 2020-12-23 ENCOUNTER — Telehealth: Payer: Self-pay | Admitting: Radiation Oncology

## 2020-12-23 NOTE — Telephone Encounter (Signed)
Called to schedule appt with Dr. Lisbeth Renshaw. Unable to LVM due to mailbox being full.

## 2020-12-26 ENCOUNTER — Other Ambulatory Visit (HOSPITAL_COMMUNITY): Payer: Self-pay

## 2020-12-26 ENCOUNTER — Other Ambulatory Visit: Payer: Self-pay

## 2020-12-26 MED ORDER — GABAPENTIN 300 MG PO CAPS
300.0000 mg | ORAL_CAPSULE | Freq: Three times a day (TID) | ORAL | 3 refills | Status: DC
  Filled 2020-12-26: qty 270, 90d supply, fill #0

## 2020-12-26 MED FILL — Ramipril Cap 2.5 MG: ORAL | 90 days supply | Qty: 45 | Fill #1 | Status: AC

## 2020-12-26 MED FILL — Sitagliptin Phosphate Tab 100 MG (Base Equiv): ORAL | 30 days supply | Qty: 30 | Fill #2 | Status: AC

## 2020-12-31 ENCOUNTER — Other Ambulatory Visit (HOSPITAL_COMMUNITY): Payer: Self-pay

## 2020-12-31 LAB — SURGICAL PATHOLOGY

## 2020-12-31 NOTE — Progress Notes (Signed)
New Breast Cancer Diagnosis: Right Breast- UOQ  Did patient present with symptoms (if so, please note symptoms) or screening mammography?:Screening Mass    Location and Extent of disease :right breast. Located in the upper outer quadrant, measured  2.2 cm in greatest dimension. Adenopathy, no.  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 12/18/2020  Receptor Status: ER(positive), PR (positive), Her2-neu (), Ki-(15%)  Surgeon and surgical plan, if any: Dr. Donne Hazel -Right Breast lumpectomy with radioactive seed and right axillary SLN biopsy 12/18/2020 -Follow-up 01/16/2021   Medical oncologist, treatment if any:   Dr. Lindi Adie Treatment plan: 1.  Neoadjuvant chemotherapy with Taxol and Herceptin weekly x12 followed by Herceptin maintenance- Completed 12/12/2020 2. breast conserving surgery with some [place] biopsy 3.  Adjuvant radiation 4.  Adjuvant antiestrogen therapy  -Follow-up 01/02/2021  Family History of Breast/Ovarian/Prostate Cancer: Father had prostate  Lymphedema issues, if any: No      Pain issues, if any: Has soreness and tenderness in her breast and arm.     SAFETY ISSUES: Prior radiation? No Pacemaker/ICD? No Possible current pregnancy? Postmenopausal Is the patient on methotrexate? No  Current Complaints / other details:  -Port Insertion 08/26/2020

## 2021-01-01 ENCOUNTER — Encounter: Payer: Self-pay | Admitting: Radiation Oncology

## 2021-01-01 ENCOUNTER — Other Ambulatory Visit: Payer: Self-pay

## 2021-01-01 ENCOUNTER — Ambulatory Visit
Admission: RE | Admit: 2021-01-01 | Discharge: 2021-01-01 | Disposition: A | Discharge status: Home / Self Care | Payer: 59 | Source: Ambulatory Visit | Attending: Radiation Oncology | Admitting: Radiation Oncology

## 2021-01-01 ENCOUNTER — Other Ambulatory Visit (HOSPITAL_COMMUNITY): Payer: Self-pay

## 2021-01-01 VITALS — BP 115/70 | HR 83 | Temp 96.9°F | Resp 18 | Ht 64.0 in | Wt 156.2 lb

## 2021-01-01 DIAGNOSIS — Z809 Family history of malignant neoplasm, unspecified: Secondary | ICD-10-CM | POA: Diagnosis not present

## 2021-01-01 DIAGNOSIS — Z79899 Other long term (current) drug therapy: Secondary | ICD-10-CM | POA: Insufficient documentation

## 2021-01-01 DIAGNOSIS — C50411 Malignant neoplasm of upper-outer quadrant of right female breast: Secondary | ICD-10-CM | POA: Insufficient documentation

## 2021-01-01 DIAGNOSIS — E11319 Type 2 diabetes mellitus with unspecified diabetic retinopathy without macular edema: Secondary | ICD-10-CM | POA: Diagnosis not present

## 2021-01-01 DIAGNOSIS — Z170 Estrogen receptor positive status [ER+]: Secondary | ICD-10-CM

## 2021-01-01 DIAGNOSIS — E119 Type 2 diabetes mellitus without complications: Secondary | ICD-10-CM | POA: Diagnosis not present

## 2021-01-01 DIAGNOSIS — E785 Hyperlipidemia, unspecified: Secondary | ICD-10-CM | POA: Diagnosis not present

## 2021-01-01 DIAGNOSIS — Z794 Long term (current) use of insulin: Secondary | ICD-10-CM | POA: Insufficient documentation

## 2021-01-01 NOTE — Progress Notes (Signed)
Patient Care Team: Shon Baton, MD as PCP - General (Internal Medicine) Buford Dresser, MD as PCP - Cardiology (Cardiology) Mauro Kaufmann, RN as Oncology Nurse Navigator Rockwell Germany, RN as Oncology Nurse Navigator  DIAGNOSIS:    ICD-10-CM   1. Malignant neoplasm of upper-outer quadrant of right breast in female, estrogen receptor positive ([place])  C50.411    Z17.0       SUMMARY OF ONCOLOGIC HISTORY: Oncology History  Malignant neoplasm of upper-outer quadrant of right breast in female, estrogen receptor positive ([place])  08/08/2020 Initial Diagnosis   Screening detected right breast mass 2.2 cm by ultrasound upper outer quadrant biopsy: Grade 2-3 IDC with DCIS ER 60%, PR 20%, Ki-67 15%, HER-2 +3+   08/15/2020 Cancer Staging   Staging form: Breast, AJCC 8th Edition - Clinical stage from 08/15/2020: Stage IB (cT2, cN0, cM0, G3, ER+, PR+, HER2+) - Signed by Nicholas Lose, MD on 08/15/2020  Stage prefix: Initial diagnosis    08/27/2020 -  Chemotherapy    Patient is on Treatment Plan: BREAST PACLITAXEL + TRASTUZUMAB Q7D / TRASTUZUMAB Q21D         CHIEF COMPLIANT: Cycle 6 Taxol Herceptin  INTERVAL HISTORY: Rachel Gaines is a 65 y.o. with above-mentioned history of right breast cancer currently on neoadjuvant chemotherapy with Taxol Herceptin. She presents to the clinic today for cycle 6.   ALLERGIES:  is allergic to penicillins.  MEDICATIONS:  Current Outpatient Medications  Medication Sig Dispense Refill   albuterol (VENTOLIN HFA) 108 (90 Base) MCG/ACT inhaler INHALE 1 TO 2 PUFFS BY MOUTH INTO THE LUNGS EVERY 4 TO 6 HOURS AS NEEDED FOR SHORTNESS OF BREATH/WHEEZING/COUGH URGENCY (Patient not taking: No sig reported) 18 g 0   benzonatate (TESSALON) 200 MG capsule TAKE 1 CAPSULE BY MOUTH 3 TIMES DAILY AS NEEDED (Patient not taking: No sig reported) 30 capsule 0   Biotin 5 MG CAPS Take 5 mg by mouth daily.     Cholecalciferol (VITAMIN D3) 125 MCG (5000 UT) CAPS Take  5,000 Units by mouth daily.     Continuous Blood Gluc Sensor (FREESTYLE LIBRE 2 SENSOR) MISC Change every 14 days to monitor blood glucose continuosly     Continuous Blood Gluc Sensor (FREESTYLE LIBRE 2 SENSOR) MISC Change every 14 days to monitor blood sugar as directed 9 each 3   diclofenac Sodium (VOLTAREN) 1 % GEL Apply 2 g topically every evening.     diphenoxylate-atropine (LOMOTIL) 2.5-0.025 MG tablet TAKE 1 TABLET BY MOUTH 4 (FOUR) TIMES DAILY AS NEEDED FOR DIARRHEA OR LOOSE STOOLS. (Patient taking differently: Take 2 tablets by mouth 4 (four) times daily as needed for diarrhea or loose stools.) 30 tablet 0   diphenoxylate-atropine (LOMOTIL) 2.5-0.025 MG tablet Take 1 tablet by mouth 4 (four) times daily as needed for diarrhea  or loose stools 30 tablet 0   empagliflozin (JARDIANCE) 10 MG TABS tablet TAKE 1 TABLET BY MOUTH ONCE A DAY 30 tablet 3   FREESTYLE LITE test strip      gabapentin (NEURONTIN) 300 MG capsule Take 300 mg by mouth at bedtime.     gabapentin (NEURONTIN) 300 MG capsule Take 1 capsule (300 mg total) by mouth 3 (three) times daily as directed. 270 capsule 3   insulin detemir (LEVEMIR) 100 UNIT/ML FlexPen INJECT 34 UNITS SUBCUTANEOUSLY TWICE DAILY (Patient not taking: No sig reported) 15 mL 3   Insulin Glargine (BASAGLAR KWIKPEN) 100 UNIT/ML INJECT 34 UNITS UNDER THE SKIN TWICE DAILY (Patient not taking: Reported on 12/05/2020)  45 mL 3   Insulin Glargine-yfgn 100 UNIT/ML SOPN INJECT 34 UNITS SUBCUTANEOUSLY TWICE DAILY (Patient taking differently: Inject 34 Units into the skin in the morning and at bedtime.) 15 mL 3   insulin lispro (HUMALOG) 100 UNIT/ML KwikPen Inject 14 Units into the skin 3 (three) times daily with meals. 45 mL 3   lidocaine-prilocaine (EMLA) cream APPLY 1 APPLICATION TOPICALLY AS NEEDED. (Patient taking differently: Apply 1 application topically daily as needed (port access).) 30 g 0   NONFORMULARY OR COMPOUNDED ITEM [facility]:  Antiinflammatory  cream - Diclofenac 3%, Baclofen 2%, Cyclobenzaprine 2%, Lidocaine 2%, dispense 120grams, apply 1-2 grams to affected area 3-4 times a day, +2refills. (Patient not taking: No sig reported) 120 each 2   ondansetron (ZOFRAN) 8 MG tablet TAKE 1 TABLET (8 MG TOTAL) BY MOUTH EVERY 8 (EIGHT) HOURS AS NEEDED FOR NAUSEA OR VOMITING. (Patient taking differently: Take 8 mg by mouth every 8 (eight) hours as needed for vomiting or nausea.) 30 tablet 0   oxyCODONE (OXY IR/ROXICODONE) 5 MG immediate release tablet Take 1 tablet (5 mg total) by mouth every 6 (six) hours as needed for moderate pain, severe pain or breakthrough pain. 10 tablet 0   Pancrelipase, Lip-Prot-Amyl, (ZENPEP) 40000-126000 units CPEP Take 1 capsule by mouth in the morning and at bedtime. (Patient taking differently: Take 1 capsule by mouth 3 (three) times daily with meals.) 120 capsule    potassium chloride SA (KLOR-CON) 20 MEQ tablet TAKE 1 TABLET (20 MEQ TOTAL) BY MOUTH DAILY. (Patient not taking: No sig reported) 30 tablet 0   prochlorperazine (COMPAZINE) 10 MG tablet TAKE 1 TABLET (10 MG TOTAL) BY MOUTH EVERY 6 (SIX) HOURS AS NEEDED FOR NAUSEA OR VOMITING. (Patient taking differently: Take 10 mg by mouth every 6 (six) hours as needed for vomiting or nausea.) 30 tablet 0   ramipril (ALTACE) 2.5 MG capsule TAKE 1 CAPSULE BY MOUTH EVERY OTHER DAY (Patient taking differently: Take 2.5 mg by mouth every other day.) 45 capsule 3   rosuvastatin (CRESTOR) 40 MG tablet TAKE 1 TABLET BY MOUTH ONCE DAILY (Patient taking differently: Take 40 mg by mouth every evening.) 90 tablet 2   sitaGLIPtin (JANUVIA) 100 MG tablet TAKE 1 TABLET BY MOUTH ONCE DAILY (Patient taking differently: Take 100 mg by mouth daily.) 30 tablet 5   traMADol (ULTRAM) 50 MG tablet TAKE 1 TABLET (50 MG TOTAL) BY MOUTH EVERY 6 (SIX) HOURS AS NEEDED. (Patient not taking: No sig reported) 10 tablet 0   UNIFINE PENTIPS 31G X 8 MM MISC USE WITH RX OF LEVEMIR AND HUMALOG FOUR TIMES DAILY.      UNIFINE PENTIPS 32G X 4 MM MISC USE WITH LEVEMIR AND HUMALOG 4X A DAY DX-E11.65     valACYclovir (VALTREX) 500 MG tablet Take 1 tablet (500 mg total) by mouth 2 (two) times daily. Take for 3 days for outbreak (Patient not taking: No sig reported) 30 tablet 2   No current facility-administered medications for this visit.    PHYSICAL EXAMINATION: ECOG PERFORMANCE STATUS: 1 - Symptomatic but completely ambulatory  Vitals:   01/02/21 0917  BP: 118/60  Pulse: 84  Resp: 18  Temp: 97.7 F (36.5 C)  SpO2: 96%   Filed Weights   01/02/21 0917  Weight: 158 lb 5 oz (71.8 kg)     LABORATORY DATA:  I have reviewed the data as listed CMP Latest Ref Rng & Units 12/12/2020 12/09/2020 11/14/2020  Glucose 70 - 99 mg/dL 102(H) 326(H) 100(H)  BUN 8 - 23 mg/dL 7(L) 5(L) 4(L)  Creatinine 0.44 - 1.00 mg/dL 0.59 0.69 0.68  Sodium 135 - 145 mmol/L 144 136 145  Potassium 3.5 - 5.1 mmol/L 3.5 3.5 3.7  Chloride 98 - 111 mmol/L 109 102 108  CO2 22 - 32 mmol/L '29 28 30  ' Calcium 8.9 - 10.3 mg/dL 9.4 9.5 9.4  Total Protein 6.5 - 8.1 g/dL 7.5 - 6.2(L)  Total Bilirubin 0.3 - 1.2 mg/dL 0.8 - 1.1  Alkaline Phos 38 - 126 U/L 81 - 80  AST 15 - 41 U/L 16 - 23  ALT 0 - 44 U/L 13 - 19    Lab Results  Component Value Date   WBC 7.9 12/12/2020   HGB 12.6 12/12/2020   HCT 37.7 12/12/2020   MCV 83.6 12/12/2020   PLT 258 12/12/2020   NEUTROABS 4.4 12/12/2020    ASSESSMENT & PLAN:  Malignant neoplasm of upper-outer quadrant of right breast in female, estrogen receptor positive ([place]) 08/08/2020:Screening detected right breast mass 2.2 cm by ultrasound upper outer quadrant biopsy: Grade 2-3 IDC with DCIS ER 60%, PR 20%, Ki-67 15%, HER-2 +3+ T2N0 stage Ia Treatment plan: 1.  Neoadjuvant chemotherapy with Taxol and Herceptin weekly x12 followed by Herceptin maintenance 2. breast conserving surgery with some [place] biopsy 3.  Adjuvant radiation 4.  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 Diarrhea: Diarrhea is under good control 2. Mild fatigue 3. Hypokalemia-likely secondary to diarrhea.  Currently off potassium and doing well. 4. Decreased appetite/weight loss 5. Peripheral Neuropathy:  Stable symptoms.   MRI breast 11/17/2020: Right breast malignancy 2.2 cm, 5 mm FA (MRI biopsy: Benign fat necrosis)   Return to clinic every 3 weeks for Herceptin every 6 weeks to follow-up with me with labs    No orders of the defined types were placed in this encounter.  The patient has a good understanding of the overall plan. she agrees with it. she will call with any problems that may develop before the next visit here.  Total time spent: 30 mins including face to face time and time spent for planning, charting and coordination of care  Rulon Eisenmenger, MD, MPH 01/02/2021  I, Thana Ates, am acting as scribe for Dr. Nicholas Lose.  I have reviewed the above documentation for accuracy and completeness, and I agree with the above.

## 2021-01-02 ENCOUNTER — Inpatient Hospital Stay: Payer: 59 | Admitting: Dietician

## 2021-01-02 ENCOUNTER — Inpatient Hospital Stay (HOSPITAL_BASED_OUTPATIENT_CLINIC_OR_DEPARTMENT_OTHER): Payer: 59 | Admitting: Hematology and Oncology

## 2021-01-02 ENCOUNTER — Inpatient Hospital Stay: Payer: 59 | Attending: Hematology and Oncology

## 2021-01-02 DIAGNOSIS — R634 Abnormal weight loss: Secondary | ICD-10-CM | POA: Insufficient documentation

## 2021-01-02 DIAGNOSIS — T451X5D Adverse effect of antineoplastic and immunosuppressive drugs, subsequent encounter: Secondary | ICD-10-CM | POA: Insufficient documentation

## 2021-01-02 DIAGNOSIS — C50411 Malignant neoplasm of upper-outer quadrant of right female breast: Secondary | ICD-10-CM | POA: Diagnosis not present

## 2021-01-02 DIAGNOSIS — E876 Hypokalemia: Secondary | ICD-10-CM | POA: Diagnosis not present

## 2021-01-02 DIAGNOSIS — G620 Drug-induced polyneuropathy: Secondary | ICD-10-CM | POA: Diagnosis not present

## 2021-01-02 DIAGNOSIS — Z880 Allergy status to penicillin: Secondary | ICD-10-CM | POA: Diagnosis not present

## 2021-01-02 DIAGNOSIS — Z170 Estrogen receptor positive status [ER+]: Secondary | ICD-10-CM

## 2021-01-02 DIAGNOSIS — R5383 Other fatigue: Secondary | ICD-10-CM | POA: Diagnosis not present

## 2021-01-02 DIAGNOSIS — R197 Diarrhea, unspecified: Secondary | ICD-10-CM | POA: Insufficient documentation

## 2021-01-02 DIAGNOSIS — Z801 Family history of malignant neoplasm of trachea, bronchus and lung: Secondary | ICD-10-CM | POA: Insufficient documentation

## 2021-01-02 DIAGNOSIS — Z5112 Encounter for antineoplastic immunotherapy: Secondary | ICD-10-CM | POA: Diagnosis not present

## 2021-01-02 MED ORDER — HEPARIN SOD (PORK) LOCK FLUSH 100 UNIT/ML IV SOLN
500.0000 [IU] | Freq: Once | INTRAVENOUS | Status: AC | PRN
  Administered 2021-01-02: 500 [IU]
  Filled 2021-01-02: qty 5

## 2021-01-02 MED ORDER — SODIUM CHLORIDE 0.9 % IV SOLN
Freq: Once | INTRAVENOUS | Status: AC
  Filled 2021-01-02: qty 250

## 2021-01-02 MED ORDER — DIPHENHYDRAMINE HCL 25 MG PO CAPS
50.0000 mg | ORAL_CAPSULE | Freq: Once | ORAL | Status: AC
  Administered 2021-01-02: 50 mg via ORAL

## 2021-01-02 MED ORDER — ACETAMINOPHEN 325 MG PO TABS
ORAL_TABLET | ORAL | Status: AC
  Filled 2021-01-02: qty 2

## 2021-01-02 MED ORDER — DIPHENHYDRAMINE HCL 25 MG PO CAPS
ORAL_CAPSULE | ORAL | Status: AC
  Filled 2021-01-02: qty 2

## 2021-01-02 MED ORDER — ACETAMINOPHEN 325 MG PO TABS
650.0000 mg | ORAL_TABLET | Freq: Once | ORAL | Status: AC
  Administered 2021-01-02: 650 mg via ORAL

## 2021-01-02 MED ORDER — TRASTUZUMAB-ANNS CHEMO 150 MG IV SOLR
450.0000 mg | Freq: Once | INTRAVENOUS | Status: AC
  Administered 2021-01-02: 450 mg via INTRAVENOUS
  Filled 2021-01-02: qty 21.43

## 2021-01-02 MED ORDER — SODIUM CHLORIDE 0.9% FLUSH
10.0000 mL | INTRAVENOUS | Status: DC | PRN
  Administered 2021-01-02: 10 mL
  Filled 2021-01-02: qty 10

## 2021-01-02 NOTE — Progress Notes (Signed)
Nutrition Follow-up:  Patient with breast cancer. She is receiving Taxol and Herceptin. Patient is s/p right lumpectomy on 6/23.   Met with patient in infusion. She reports some lingering soreness from lumpectomy. Patient reports appetite is about the same, says it "comes and goes" Yesterday she had an avocado, shrimp chicken, rice, and broccoli. Patient reports diarrhea has gotten better, having a couple times week. Patient reports having some regular bowel movements. Patient says she has not taken her pancreatic enzymes this week, because she has not had full meals.    Medications: reviewed  Labs: reviewed  Anthropometrics: Weight 158 lb 5 oz today increased from 154 lb 1.6 oz on 6/17  6/14 155 lb 8 oz 5/27 - 156 lb 12 oz 5/13 - 155 lb 4 oz   NUTRITION DIAGNOSIS: Unintentional weight loss stable   INTERVENTION:  Discussed importance of adequate calories and protein intake to support wound healing Encouraged high protein snacks in between meals, handout provided Patient does not like oral nutrition supplements Discussed taking pancreatic enzymes as prescribed, educated on 1-2 with snacks, 3 with meals Continue taking anti-diarrheal medications as needed   MONITORING, EVALUATION, GOAL: weight trends, intake   NEXT VISIT: To be scheduled

## 2021-01-02 NOTE — Patient Instructions (Signed)
[facility]   ?Discharge Instructions: ?Thank you for choosing [facility] to provide your oncology and hematology care.  ? ?If you have a lab appointment with the Cancer Center, please go directly to the Cancer Center and check in at the registration area. ?  ?Wear comfortable clothing and clothing appropriate for easy access to any Portacath or PICC line.  ? ?We strive to give you quality time with your provider. You may need to reschedule your appointment if you arrive late (15 or more minutes).  Arriving late affects you and other patients whose appointments are after yours.  Also, if you miss three or more appointments without notifying the office, you may be dismissed from the clinic at the provider?s discretion.    ?  ?For prescription refill requests, have your pharmacy contact our office and allow 72 hours for refills to be completed.   ? ?Today you received the following chemotherapy and/or immunotherapy agents: trastuzumab-anns    ?  ?To help prevent nausea and vomiting after your treatment, we encourage you to take your nausea medication as directed. ? ?BELOW ARE SYMPTOMS THAT SHOULD BE REPORTED IMMEDIATELY: ?*FEVER GREATER THAN 100.4 F (38 ?C) OR HIGHER ?*CHILLS OR SWEATING ?*NAUSEA AND VOMITING THAT IS NOT CONTROLLED WITH YOUR NAUSEA MEDICATION ?*UNUSUAL SHORTNESS OF BREATH ?*UNUSUAL BRUISING OR BLEEDING ?*URINARY PROBLEMS (pain or burning when urinating, or frequent urination) ?*BOWEL PROBLEMS (unusual diarrhea, constipation, pain near the anus) ?TENDERNESS IN MOUTH AND THROAT WITH OR WITHOUT PRESENCE OF ULCERS (sore throat, sores in mouth, or a toothache) ?UNUSUAL RASH, SWELLING OR PAIN  ?UNUSUAL VAGINAL DISCHARGE OR ITCHING  ? ?Items with * indicate a potential emergency and should be followed up as soon as possible or go to the Emergency Department if any problems should occur. ? ?Please show the CHEMOTHERAPY ALERT CARD or IMMUNOTHERAPY ALERT CARD at  check-in to the Emergency Department and triage nurse. ? ?Should you have questions after your visit or need to cancel or reschedule your appointment, please contact [facility]  Dept: [phone]  and follow the prompts.  Office hours are 8:00 a.m. to 4:30 p.m. Monday - Friday. Please note that voicemails left after 4:00 p.m. may not be returned until the following business day.  We are closed weekends and major holidays. You have access to a nurse at all times for urgent questions. Please call the main number to the clinic Dept: [phone] and follow the prompts. ? ? ?For any non-urgent questions, you may also contact your provider using MyChart. We now offer e-Visits for anyone 18 and older to request care online for non-urgent symptoms. For details visit [email or web address]. ?  ?Also download the MyChart app! Go to the app store, search "MyChart", open the app, select [facility], and log in with your MyChart username and password. ? ?Due to Covid, a mask is required upon entering the hospital/clinic. If you do not have a mask, one will be given to you upon arrival. For doctor visits, patients may have 1 support person aged 18 or older with them. For treatment visits, patients cannot have anyone with them due to current Covid guidelines and our immunocompromised population.  ? ?

## 2021-01-02 NOTE — Assessment & Plan Note (Signed)
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 Diarrhea:Diarrhea is under good control 2. Mild fatigue 3. Hypokalemia-likely secondary to diarrhea.On oral potassium20 meq daily 4. Decreased appetite/weight loss 5. Peripheral Neuropathy: taxol dose reduced.Stable symptoms.  MRI breast 11/17/2020: Right breast malignancy 2.2 cm, 5 mm FA (MRI biopsy: Benign fat necrosis)  Return to clinic every 3 weeks for Herceptin every 6 weeks to follow-up with me with labs

## 2021-01-04 NOTE — Progress Notes (Signed)
Radiation Oncology         [phone] ________________________________  Name: Rachel Gaines MRN: [id number]  Date: 01/01/2021  DOB: 09/06/1955  QI:ONGEX, Jenny Reichmann, MD  Nicholas Lose, MD     REFERRING PHYSICIAN: Nicholas Lose, MD   DIAGNOSIS: The encounter diagnosis was Malignant neoplasm of upper-outer quadrant of right breast in female, estrogen receptor positive ([place]).   HISTORY OF PRESENT ILLNESS::Rachel Gaines is a 65 y.o. female who is seen for an initial consultation visit regarding the patient's diagnosis of right-sided breast cancer.  The patient was found to have an abnormality on screening mammogram within the upper outer quadrant.  Further imaging revealed a 2.2 cm tumor at this location.  No axillary adenopathy was seen.  Biopsy did confirm an invasive ductal carcinoma.  The patient proceeded therefore with neoadjuvant chemotherapy followed by a right sided lumpectomy on 12/18/2020.  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  Receptor studies indicated that the tumor is estrogen receptor positive and progesterone receptor positive.  Ki-67 was 15%.  Margins were negative with the closest margin being 1 mm posteriorly with regards to DCIS.  None of the 6 lymph nodes evaluated revealed carcinoma.  The patient therefore was seen today for discussion of possible adjuvant radiation treatment.    PREVIOUS RADIATION THERAPY: No   PAST MEDICAL HISTORY:  has a past medical history of Abnormal Pap smear of cervix (2014), Anemia, Breast cancer ([place]) (11/2020), Cancer ([place]) (06/2020), Cataract, Diabetes mellitus without complication ([place]), Hyperlipidemia, Plantar fasciitis, and STD (sexually transmitted disease).     PAST SURGICAL HISTORY: Past Surgical History:  Procedure Laterality Date   BREAST LUMPECTOMY WITH RADIOACTIVE SEED AND SENTINEL LYMPH NODE BIOPSY Right 12/18/2020   Procedure: RIGHT BREAST LUMPECTOMY WITH RADIOACTIVE SEED AND RIGHT AXILLARY SENTINEL  LYMPH NODE BIOPSY;  Surgeon: Rolm Bookbinder, MD;  Location: [facility];  Service: General;  Laterality: Right;   CATARACT EXTRACTION  2019   COLONOSCOPY     COLPOSCOPY W/ BIOPSY / CURETTAGE  02/04/1999   Chronic cervicitis   EYE SURGERY     PORTACATH PLACEMENT N/A 08/26/2020   Procedure: INSERTION PORT-A-CATH;  Surgeon: Rolm Bookbinder, MD;  Location: [facility];  Service: General;  Laterality: N/A;  START TIME OF 12:30 PM FOR 60 MINUTES ROOM 8     FAMILY HISTORY: family history includes Cancer in her brother and father; Diabetes in her brother, father, mother, sister, sister, sister, sister, sister, sister, and sister; Heart disease in her father, sister, and sister; Heart failure in her father and sister; Lupus in her daughter; Thyroid disease in her sister.   SOCIAL HISTORY:  reports that she has never smoked. She has never used smokeless tobacco. She reports that she does not drink alcohol and does not use drugs.   ALLERGIES: Penicillins   MEDICATIONS:  Current Outpatient Medications  Medication Sig Dispense Refill   Biotin 5 MG CAPS Take 5 mg by mouth daily.     Cholecalciferol (VITAMIN D3) 125 MCG (5000 UT) CAPS Take 5,000 Units by mouth daily.     Continuous Blood Gluc Sensor (FREESTYLE LIBRE 2 SENSOR) MISC Change every 14 days to monitor blood glucose continuosly     Continuous Blood Gluc Sensor (FREESTYLE LIBRE 2 SENSOR) MISC Change every 14 days to monitor blood sugar as directed 9 each 3   diclofenac Sodium (VOLTAREN) 1 % GEL Apply 2 g topically every evening.     diphenoxylate-atropine (LOMOTIL) 2.5-0.025 MG tablet TAKE 1 TABLET BY  MOUTH 4 (FOUR) TIMES DAILY AS NEEDED FOR DIARRHEA OR LOOSE STOOLS. (Patient taking differently: Take 2 tablets by mouth 4 (four) times daily as needed for diarrhea or loose stools.) 30 tablet 0   diphenoxylate-atropine (LOMOTIL) 2.5-0.025 MG tablet Take 1 tablet by mouth 4 (four) times daily as needed for diarrhea  or loose stools 30  tablet 0   empagliflozin (JARDIANCE) 10 MG TABS tablet TAKE 1 TABLET BY MOUTH ONCE A DAY 30 tablet 3   FREESTYLE LITE test strip      gabapentin (NEURONTIN) 300 MG capsule Take 300 mg by mouth at bedtime.     gabapentin (NEURONTIN) 300 MG capsule Take 1 capsule (300 mg total) by mouth 3 (three) times daily as directed. 270 capsule 3   Insulin Glargine-yfgn 100 UNIT/ML SOPN INJECT 34 UNITS SUBCUTANEOUSLY TWICE DAILY (Patient taking differently: Inject 34 Units into the skin in the morning and at bedtime.) 15 mL 3   insulin lispro (HUMALOG) 100 UNIT/ML KwikPen Inject 14 Units into the skin 3 (three) times daily with meals. 45 mL 3   lidocaine-prilocaine (EMLA) cream APPLY 1 APPLICATION TOPICALLY AS NEEDED. (Patient taking differently: Apply 1 application topically daily as needed (port access).) 30 g 0   Pancrelipase, Lip-Prot-Amyl, (ZENPEP) 40000-126000 units CPEP Take 1 capsule by mouth in the morning and at bedtime. (Patient taking differently: Take 1 capsule by mouth 3 (three) times daily with meals.) 120 capsule    ramipril (ALTACE) 2.5 MG capsule TAKE 1 CAPSULE BY MOUTH EVERY OTHER DAY (Patient taking differently: Take 2.5 mg by mouth every other day.) 45 capsule 3   rosuvastatin (CRESTOR) 40 MG tablet TAKE 1 TABLET BY MOUTH ONCE DAILY (Patient taking differently: Take 40 mg by mouth every evening.) 90 tablet 2   sitaGLIPtin (JANUVIA) 100 MG tablet TAKE 1 TABLET BY MOUTH ONCE DAILY (Patient taking differently: Take 100 mg by mouth daily.) 30 tablet 5   UNIFINE PENTIPS 31G X 8 MM MISC USE WITH RX OF LEVEMIR AND HUMALOG FOUR TIMES DAILY.     UNIFINE PENTIPS 32G X 4 MM MISC USE WITH LEVEMIR AND HUMALOG 4X A DAY DX-E11.65     albuterol (VENTOLIN HFA) 108 (90 Base) MCG/ACT inhaler INHALE 1 TO 2 PUFFS BY MOUTH INTO THE LUNGS EVERY 4 TO 6 HOURS AS NEEDED FOR SHORTNESS OF BREATH/WHEEZING/COUGH URGENCY (Patient not taking: No sig reported) 18 g 0   benzonatate (TESSALON) 200 MG capsule TAKE 1 CAPSULE BY  MOUTH 3 TIMES DAILY AS NEEDED (Patient not taking: No sig reported) 30 capsule 0   insulin detemir (LEVEMIR) 100 UNIT/ML FlexPen INJECT 34 UNITS SUBCUTANEOUSLY TWICE DAILY (Patient not taking: No sig reported) 15 mL 3   Insulin Glargine (BASAGLAR KWIKPEN) 100 UNIT/ML INJECT 34 UNITS UNDER THE SKIN TWICE DAILY (Patient not taking: Reported on 12/05/2020) 45 mL 3   NONFORMULARY OR COMPOUNDED ITEM [facility]:  Antiinflammatory cream - Diclofenac 3%, Baclofen 2%, Cyclobenzaprine 2%, Lidocaine 2%, dispense 120grams, apply 1-2 grams to affected area 3-4 times a day, +2refills. (Patient not taking: No sig reported) 120 each 2   valACYclovir (VALTREX) 500 MG tablet Take 1 tablet (500 mg total) by mouth 2 (two) times daily. Take for 3 days for outbreak (Patient not taking: No sig reported) 30 tablet 2   No current facility-administered medications for this encounter.     REVIEW OF SYSTEMS:  A 15 point review of systems is documented in the electronic medical record. This was obtained by the nursing staff. However, I  reviewed this with the patient to discuss relevant findings and make appropriate changes.  Pertinent items are noted in HPI.    PHYSICAL EXAM:  height is _0  (1.626 m) and weight is 156 lb 4 oz (70.9 kg). Her temporal temperature is 96.9 F (36.1 C) (abnormal). Her blood pressure is 115/70 and her pulse is 83. Her respiration is 18 and oxygen saturation is 99%.   ECOG = 1  0 - Asymptomatic (Fully active, able to carry on all predisease activities without restriction)  1 - Symptomatic but completely ambulatory (Restricted in physically strenuous activity but ambulatory and able to carry out work of a light or sedentary nature. For example, light housework, office work)  2 - Symptomatic, <50% in bed during the day (Ambulatory and capable of all self care but unable to carry out any work activities. Up and about more than 50% of waking hours)  3 - Symptomatic, >50% in bed, but not  bedbound (Capable of only limited self-care, confined to bed or chair 50% or more of waking hours)  4 - Bedbound (Completely disabled. Cannot carry on any self-care. Totally confined to bed or chair)  5 - Death   Eustace Pen MM, Creech RH, Tormey DC, et al. [phone]). "Toxicity and response criteria of the [facility]". [place] Oncol. 5 (6): 649-55  Alert, no distress   LABORATORY DATA:  Lab Results  Component Value Date   WBC 7.9 12/12/2020   HGB 12.6 12/12/2020   HCT 37.7 12/12/2020   MCV 83.6 12/12/2020   PLT 258 12/12/2020   Lab Results  Component Value Date   NA 144 12/12/2020   K 3.5 12/12/2020   CL 109 12/12/2020   CO2 29 12/12/2020   Lab Results  Component Value Date   ALT 13 12/12/2020   AST 16 12/12/2020   ALKPHOS 81 12/12/2020   BILITOT 0.8 12/12/2020      RADIOGRAPHY: NM Sentinel Node Inj-No Rpt (Breast)  Result Date: 12/18/2020 Sulfur Colloid was injected by the Nuclear Medicine Technologist for sentinel lymph node localization.   MM Breast Surgical Specimen  Result Date: 12/18/2020 CLINICAL DATA:  Specimen radiograph status post right breast lumpectomy. EXAM: SPECIMEN RADIOGRAPH OF THE RIGHT BREAST COMPARISON:  Previous exam(s). FINDINGS: Status post excision of the right breast. The radioactive seed and biopsy marker clip are present, completely intact, and were marked for pathology. These findings were communicated with the OR at 11:34 a.m. IMPRESSION: Specimen radiograph of the right breast. Electronically Signed   By: Ammie Ferrier M.D.   On: 12/18/2020 11:43  MM RT RADIOACTIVE SEED LOC MAMMO GUIDE  Result Date: 12/17/2020 CLINICAL DATA:  Patient presents for seed localization of the RIGHT breast prior to lumpectomy for recently diagnosed invasive ductal carcinoma and ductal carcinoma in situ in the 10 o'clock location of the RIGHT breast. EXAM: MAMMOGRAPHIC GUIDED RADIOACTIVE SEED LOCALIZATION OF THE RIGHT BREAST COMPARISON:   Previous exam(s). FINDINGS: Patient presents for radioactive seed localization prior to lumpectomy. I met with the patient and we discussed the procedure of seed localization including benefits and alternatives. We discussed the high likelihood of a successful procedure. We discussed the risks of the procedure including infection, bleeding, tissue injury and further surgery. We discussed the low dose of radioactivity involved in the procedure. Informed, written consent was given. The usual time-out protocol was performed immediately prior to the procedure. Using mammographic guidance, sterile technique, 1% lidocaine and an I-125 radioactive seed, the ribbon shaped clip in  the UPPER-OUTER QUADRANT of the RIGHT breast was localized using a LATERAL to MEDIAL approach. The follow-up mammogram images confirm the seed in the expected location and were marked for Dr. Donne Hazel. Follow-up survey of the patient confirms presence of the radioactive seed. Order number of I-125 seed:  [id number]. Total activity:  6.438 millicuries reference Date: 11/17/2020 The patient tolerated the procedure well and was released from the [facility]. She was given instructions regarding seed removal. IMPRESSION: Radioactive seed localization right breast. No apparent complications. Electronically Signed   By: Nolon Nations M.D.   On: 12/17/2020 14:25      IMPRESSION/ PLAN:  The patient underwent evaluation today for her diagnosis of a T2 N0 M0 invasive ductal carcinoma of the right breast.  She is status post neoadjuvant chemotherapy followed by right sided lumpectomy on 12/18/2020.  The patient is an appropriate candidate for a course of adjuvant radiation treatment at this time.  I discussed with the patient the expected benefit in terms of local control.  We also discussed possible side effects and risks.  I would anticipate a 4-week course of adjuvant radiation treatment to the right breast.  After this discussion, the patient  indicated that she did wish to proceed with such a treatment.  We will have the patient proceed with simulation in the near future for treatment planning.  The patient was seen in person today in clinic.  The total time spent on the patient's visit today was 45 minutes, including chart review, direct discussion/evaluation with the patient, and coordination of care.        ________________________________   Jodelle Gross, MD, PhD   **Disclaimer: This note was dictated with voice recognition software. Similar sounding words can inadvertently be transcribed and this note may contain transcription errors which may not have been corrected upon publication of note.**

## 2021-01-05 ENCOUNTER — Other Ambulatory Visit (HOSPITAL_COMMUNITY): Payer: Self-pay

## 2021-01-05 ENCOUNTER — Telehealth: Payer: Self-pay | Admitting: Hematology and Oncology

## 2021-01-05 NOTE — Telephone Encounter (Signed)
Scheduled per 7/8 los. Pt will receive an updated appt calendar per next visit appt notes

## 2021-01-06 ENCOUNTER — Encounter: Payer: Self-pay | Admitting: *Deleted

## 2021-01-06 ENCOUNTER — Encounter: Payer: Self-pay | Admitting: Licensed Clinical Social Worker

## 2021-01-06 NOTE — Progress Notes (Signed)
[facility] Psychosocial Distress Screening Clinical Social Work  Clinical Social Work was referred by distress screening protocol.  The patient scored a 9 on the Psychosocial Distress Thermometer which indicates severe distress. Clinical Social Worker  attempted to contact patient by phone  to assess for distress and other psychosocial needs.   No answer, left VM with direct contact information. CSW has met with patient in the past and reviewed support services available.  [facility] DISTRESS SCREENING 01/01/2021  Screening Type Initial Screening  Distress experienced in past week (1-10) 9  Practical problem type Insurance  Emotional problem type Depression;Adjusting to illness  Physical Problem type Sleep/insomnia;Loss of appetitie;Tingling hands/feet;Constipation/diarrhea  Referral to clinical social work   Wellsite geologist via phone     [place], LCSW

## 2021-01-08 ENCOUNTER — Ambulatory Visit
Admission: RE | Admit: 2021-01-08 | Discharge: 2021-01-08 | Disposition: A | Discharge status: Home / Self Care | Payer: 59 | Source: Ambulatory Visit | Attending: Radiation Oncology | Admitting: Radiation Oncology

## 2021-01-08 ENCOUNTER — Other Ambulatory Visit: Payer: Self-pay

## 2021-01-08 DIAGNOSIS — Z510 Encounter for antineoplastic radiation therapy: Secondary | ICD-10-CM | POA: Insufficient documentation

## 2021-01-08 DIAGNOSIS — C50411 Malignant neoplasm of upper-outer quadrant of right female breast: Secondary | ICD-10-CM | POA: Diagnosis not present

## 2021-01-08 DIAGNOSIS — Z170 Estrogen receptor positive status [ER+]: Secondary | ICD-10-CM | POA: Diagnosis not present

## 2021-01-12 DIAGNOSIS — Z170 Estrogen receptor positive status [ER+]: Secondary | ICD-10-CM | POA: Diagnosis not present

## 2021-01-12 DIAGNOSIS — Z510 Encounter for antineoplastic radiation therapy: Secondary | ICD-10-CM | POA: Diagnosis not present

## 2021-01-12 DIAGNOSIS — C50411 Malignant neoplasm of upper-outer quadrant of right female breast: Secondary | ICD-10-CM | POA: Diagnosis not present

## 2021-01-13 ENCOUNTER — Telehealth: Payer: Self-pay | Admitting: *Deleted

## 2021-01-13 ENCOUNTER — Encounter: Payer: Self-pay | Admitting: Licensed Clinical Social Worker

## 2021-01-13 NOTE — Telephone Encounter (Deleted)
Return call message to Analycia Khokhar [phone]) requesting return call to this nurse for her inquiries included request for further information.    - Has a new [employer] claim been requested for 12/18/2020 surgery as previous claim submitted March 2022 was for intermittent FMLA during chemotherapy.  No new [employer] form received. -What date did you last work?  [employer] form asks if provider recommended stop work and when. - Confirmed receipt of [employer] request yet last date of work before surgery is needed to complete form.      Voicemails requested: "Ms. Walker with [employer] called and told me form reads eight hours and I need more than eight hours.  [employer] form needs to be changed."  "My [employer] sent forms.  When you return please call to confirm receipt."

## 2021-01-13 NOTE — Progress Notes (Signed)
[place] Work  Holiday representative received return call from patient regarding distress screen. Patient reports being in more pain now than immediately after surgery-  seeing surgeon on Friday. She is also stressed about having her FMLA forms corrected- has left a message for [place].  Her finances have been impacted due to needing to take time off for chemo and surgery. CSW provided supportive counsel and discussed other resources that may be able to provide financial assistance.  Mailed information on breast cancer foundations and [facility] caring for each other fund.  Encouraged patient to call with any questions or for help submitting applications.     [place], [place] Worker [employer]

## 2021-01-14 ENCOUNTER — Encounter: Payer: Self-pay | Admitting: *Deleted

## 2021-01-14 ENCOUNTER — Encounter: Payer: Self-pay | Admitting: Hematology and Oncology

## 2021-01-14 NOTE — Telephone Encounter (Addendum)
Rachel Gaines reports "I asked [employer] to fax the FMLA request to [phone].  My last day of work was June 17th or 18th.  Pre-op appointment 12/17/2020, surgery 12/18/2020.  Do not know how much time I have for FMLA.  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  Unable to lift or care for patients.  Work schedule 7:00 am to 7:00 pm every Saturday, Sunday and Wednesday.  Eight hours does not cover my work hours.  Twenty days (4 wks) of Radiation begins July 25th.  Severe fatigue is what I was told to expect so cannot work.  What do I need to do?"  Currently no new form received.  Advised to communicate with Benefits Department to determine if next steps with Long-Term disability or ADA dependent upon treatment response and ability to return to work.    Following previous entry deleted in error to sign today's note.  Return call message to Rachel Gaines [phone]) requesting return call to this nurse for her inquiries included request for further information.     - Has a new [employer] claim been requested for 12/18/2020 surgery as previous claim submitted March 2022 was for intermittent FMLA during chemotherapy.  No new [employer] form received. -What date did you last work?  [employer] form asks if provider recommended stop work and when. - Confirmed receipt of [employer] request yet last date of work before surgery is needed to complete form.       Voicemails requested: "Rachel Gaines with [employer] called and told me form reads eight hours and I need more than eight hours.  [employer] form needs to be changed." "My [employer] sent forms.  When you return please call to confirm receipt."

## 2021-01-15 ENCOUNTER — Other Ambulatory Visit (HOSPITAL_COMMUNITY): Payer: Self-pay

## 2021-01-16 ENCOUNTER — Other Ambulatory Visit (HOSPITAL_COMMUNITY): Payer: Self-pay

## 2021-01-16 MED ORDER — METHOCARBAMOL 500 MG PO TABS
500.0000 mg | ORAL_TABLET | Freq: Four times a day (QID) | ORAL | 1 refills | Status: AC
  Filled 2021-01-16: qty 20, 5d supply, fill #0
  Filled 2021-02-09: qty 20, 5d supply, fill #1

## 2021-01-19 ENCOUNTER — Ambulatory Visit: Payer: 59 | Admitting: Radiation Oncology

## 2021-01-19 ENCOUNTER — Telehealth: Payer: Self-pay

## 2021-01-19 ENCOUNTER — Telehealth: Payer: Self-pay | Admitting: *Deleted

## 2021-01-19 NOTE — Telephone Encounter (Signed)
Rachel Gaines [phone]).  "[employer] called reporting no receipt of form.  Radiation has changed start date of radiation sometime on August 1, 20022 because I have not received physical therapy yet."  Will re-send form with radiation update.  Provided radiation appointment time, July 29th appt. Time to arrive 12:30 pm for registration.  [place]. phone number [phone] provided as well.

## 2021-01-19 NOTE — Telephone Encounter (Signed)
Pt called and LVM stating she would like someone to call her back, but left no details. This LPN called pt back, no answer, LVM.

## 2021-01-20 ENCOUNTER — Ambulatory Visit: Payer: 59

## 2021-01-21 ENCOUNTER — Telehealth: Payer: Self-pay | Admitting: *Deleted

## 2021-01-21 ENCOUNTER — Telehealth: Payer: Self-pay | Admitting: Radiation Oncology

## 2021-01-21 ENCOUNTER — Ambulatory Visit: Payer: 59

## 2021-01-21 DIAGNOSIS — C50411 Malignant neoplasm of upper-outer quadrant of right female breast: Secondary | ICD-10-CM

## 2021-01-21 DIAGNOSIS — Z170 Estrogen receptor positive status [ER+]: Secondary | ICD-10-CM

## 2021-01-21 NOTE — Telephone Encounter (Signed)
I LM for the patient to see how her ROM is doing. Her treatment was delayed a week due to difficulty with ROM.

## 2021-01-21 NOTE — Telephone Encounter (Signed)
The patient called back and stated she has not heard from PT. She is hoping to still try to start treatment on Monday. I placed a stat referral to PT due to ROM issues preventing radiation.

## 2021-01-21 NOTE — Telephone Encounter (Signed)
Returned call to [employer]  DIsability Market researcher.  Confirmed receipt of form claim No.#: [id number].  Provider out of office through 01/26/2021.  No questions or needs from the [facility]

## 2021-01-22 ENCOUNTER — Encounter: Payer: Self-pay | Admitting: Radiation Oncology

## 2021-01-22 ENCOUNTER — Ambulatory Visit: Payer: 59 | Admitting: Hematology and Oncology

## 2021-01-22 ENCOUNTER — Ambulatory Visit: Payer: 59

## 2021-01-22 ENCOUNTER — Other Ambulatory Visit: Payer: 59

## 2021-01-23 ENCOUNTER — Inpatient Hospital Stay: Payer: 59

## 2021-01-23 ENCOUNTER — Other Ambulatory Visit: Payer: 59

## 2021-01-23 ENCOUNTER — Other Ambulatory Visit: Payer: Self-pay

## 2021-01-23 ENCOUNTER — Ambulatory Visit: Payer: 59

## 2021-01-23 ENCOUNTER — Other Ambulatory Visit (HOSPITAL_COMMUNITY): Payer: Self-pay

## 2021-01-23 ENCOUNTER — Inpatient Hospital Stay (HOSPITAL_BASED_OUTPATIENT_CLINIC_OR_DEPARTMENT_OTHER): Payer: 59 | Admitting: Adult Health

## 2021-01-23 ENCOUNTER — Ambulatory Visit: Payer: 59 | Admitting: Hematology and Oncology

## 2021-01-23 ENCOUNTER — Encounter: Payer: Self-pay | Admitting: Adult Health

## 2021-01-23 VITALS — BP 120/46 | HR 69 | Temp 97.7°F | Resp 18 | Ht 64.0 in | Wt 161.5 lb

## 2021-01-23 DIAGNOSIS — C50411 Malignant neoplasm of upper-outer quadrant of right female breast: Secondary | ICD-10-CM | POA: Diagnosis not present

## 2021-01-23 DIAGNOSIS — Z170 Estrogen receptor positive status [ER+]: Secondary | ICD-10-CM

## 2021-01-23 DIAGNOSIS — E876 Hypokalemia: Secondary | ICD-10-CM | POA: Diagnosis not present

## 2021-01-23 DIAGNOSIS — Z95828 Presence of other vascular implants and grafts: Secondary | ICD-10-CM

## 2021-01-23 DIAGNOSIS — G620 Drug-induced polyneuropathy: Secondary | ICD-10-CM | POA: Diagnosis not present

## 2021-01-23 DIAGNOSIS — R5383 Other fatigue: Secondary | ICD-10-CM | POA: Diagnosis not present

## 2021-01-23 DIAGNOSIS — Z5112 Encounter for antineoplastic immunotherapy: Secondary | ICD-10-CM | POA: Diagnosis not present

## 2021-01-23 DIAGNOSIS — R197 Diarrhea, unspecified: Secondary | ICD-10-CM | POA: Diagnosis not present

## 2021-01-23 DIAGNOSIS — T451X5D Adverse effect of antineoplastic and immunosuppressive drugs, subsequent encounter: Secondary | ICD-10-CM | POA: Diagnosis not present

## 2021-01-23 DIAGNOSIS — R634 Abnormal weight loss: Secondary | ICD-10-CM | POA: Diagnosis not present

## 2021-01-23 LAB — CBC WITH DIFFERENTIAL (CANCER CENTER ONLY)
Abs Immature Granulocytes: 0.02 10*3/uL (ref 0.00–0.07)
Basophils Absolute: 0 10*3/uL (ref 0.0–0.1)
Basophils Relative: 0 %
Eosinophils Absolute: 0.1 10*3/uL (ref 0.0–0.5)
Eosinophils Relative: 2 %
HCT: 40.1 % (ref 36.0–46.0)
Hemoglobin: 13.3 g/dL (ref 12.0–15.0)
Immature Granulocytes: 0 %
Lymphocytes Relative: 32 %
Lymphs Abs: 2.3 10*3/uL (ref 0.7–4.0)
MCH: 27.6 pg (ref 26.0–34.0)
MCHC: 33.2 g/dL (ref 30.0–36.0)
MCV: 83.2 fL (ref 80.0–100.0)
Monocytes Absolute: 0.5 10*3/uL (ref 0.1–1.0)
Monocytes Relative: 7 %
Neutro Abs: 4.3 10*3/uL (ref 1.7–7.7)
Neutrophils Relative %: 59 %
Platelet Count: 217 10*3/uL (ref 150–400)
RBC: 4.82 MIL/uL (ref 3.87–5.11)
RDW: 14.4 % (ref 11.5–15.5)
WBC Count: 7.3 10*3/uL (ref 4.0–10.5)
nRBC: 0 % (ref 0.0–0.2)

## 2021-01-23 LAB — CMP (CANCER CENTER ONLY)
ALT: 11 U/L (ref 0–44)
AST: 16 U/L (ref 15–41)
Albumin: 3.8 g/dL (ref 3.5–5.0)
Alkaline Phosphatase: 93 U/L (ref 38–126)
Anion gap: 6 (ref 5–15)
BUN: 7 mg/dL — ABNORMAL LOW (ref 8–23)
CO2: 29 mmol/L (ref 22–32)
Calcium: 9.8 mg/dL (ref 8.9–10.3)
Chloride: 109 mmol/L (ref 98–111)
Creatinine: 0.79 mg/dL (ref 0.44–1.00)
GFR, Estimated: >60 mL/min (ref >60)
Glucose, Bld: 142 mg/dL — ABNORMAL HIGH (ref 70–99)
Potassium: 4 mmol/L (ref 3.5–5.1)
Sodium: 144 mmol/L (ref 135–145)
Total Bilirubin: 0.9 mg/dL (ref 0.3–1.2)
Total Protein: 7.1 g/dL (ref 6.5–8.1)

## 2021-01-23 MED ORDER — SODIUM CHLORIDE 0.9% FLUSH
10.0000 mL | Freq: Once | INTRAVENOUS | Status: AC
  Administered 2021-01-23: 10 mL
  Filled 2021-01-23: qty 10

## 2021-01-23 MED ORDER — HEPARIN SOD (PORK) LOCK FLUSH 100 UNIT/ML IV SOLN
500.0000 [IU] | Freq: Once | INTRAVENOUS | Status: AC | PRN
  Administered 2021-01-23: 500 [IU]
  Filled 2021-01-23: qty 5

## 2021-01-23 MED ORDER — ALTEPLASE 2 MG IJ SOLR
2.0000 mg | Freq: Once | INTRAMUSCULAR | Status: AC
  Administered 2021-01-23: 2 mg
  Filled 2021-01-23: qty 2

## 2021-01-23 MED ORDER — ALTEPLASE 2 MG IJ SOLR
INTRAMUSCULAR | Status: AC
  Filled 2021-01-23: qty 2

## 2021-01-23 MED ORDER — DIPHENHYDRAMINE HCL 25 MG PO CAPS
50.0000 mg | ORAL_CAPSULE | Freq: Once | ORAL | Status: AC
  Administered 2021-01-23: 50 mg via ORAL

## 2021-01-23 MED ORDER — ACETAMINOPHEN 325 MG PO TABS
ORAL_TABLET | ORAL | Status: AC
  Filled 2021-01-23: qty 2

## 2021-01-23 MED ORDER — SODIUM CHLORIDE 0.9 % IV SOLN
Freq: Once | INTRAVENOUS | Status: AC
  Filled 2021-01-23: qty 250

## 2021-01-23 MED ORDER — ACETAMINOPHEN 325 MG PO TABS
650.0000 mg | ORAL_TABLET | Freq: Once | ORAL | Status: AC
  Administered 2021-01-23: 650 mg via ORAL

## 2021-01-23 MED ORDER — COLESTIPOL HCL 5 G PO GRAN
5.0000 g | GRANULES | Freq: Two times a day (BID) | ORAL | 12 refills | Status: DC
  Filled 2021-01-23: qty 500, 50d supply, fill #0

## 2021-01-23 MED ORDER — SODIUM CHLORIDE 0.9% FLUSH
10.0000 mL | INTRAVENOUS | Status: DC | PRN
Start: 2021-01-23 — End: 2021-01-23
  Administered 2021-01-23: 10 mL
  Filled 2021-01-23: qty 10

## 2021-01-23 MED ORDER — DIPHENHYDRAMINE HCL 25 MG PO CAPS
ORAL_CAPSULE | ORAL | Status: AC
  Filled 2021-01-23: qty 2

## 2021-01-23 MED ORDER — TRASTUZUMAB-ANNS CHEMO 150 MG IV SOLR
450.0000 mg | Freq: Once | INTRAVENOUS | Status: AC
  Administered 2021-01-23: 450 mg via INTRAVENOUS
  Filled 2021-01-23: qty 21.43

## 2021-01-23 NOTE — Progress Notes (Signed)
[facility] Follow up:    Shon Baton, MD [place] [address]   DIAGNOSIS: Cancer Staging Malignant neoplasm of upper-outer quadrant of right breast in female, estrogen receptor positive ([place]) Staging form: Breast, AJCC 8th Edition - Clinical stage from 08/15/2020: Stage IB (cT2, cN0, cM0, G3, ER+, PR+, HER2+) - Signed by Nicholas Lose, MD on 08/15/2020 Stage prefix: Initial diagnosis   SUMMARY OF ONCOLOGIC HISTORY: Oncology History  Malignant neoplasm of upper-outer quadrant of right breast in female, estrogen receptor positive ([place])  08/08/2020 Initial Diagnosis   Screening detected right breast mass 2.2 cm by ultrasound upper outer quadrant biopsy: Grade 2-3 IDC with DCIS ER 60%, PR 20%, Ki-67 15%, HER-2 +3+   08/15/2020 Cancer Staging   Staging form: Breast, AJCC 8th Edition - Clinical stage from 08/15/2020: Stage IB (cT2, cN0, cM0, G3, ER+, PR+, HER2+) - Signed by Nicholas Lose, MD on 08/15/2020  Stage prefix: Initial diagnosis    09/12/2020 - 11/14/2020 Neo-Adjuvant Chemotherapy   Neoadjuvant Taxol/Herceptin x 12 weeks   09/17/2020 Surgery   Right breast lumpectomy ([facility]): IDC 2.1 cm 6 SLN negative for cancer   11/21/2020 -  Adjuvant Chemotherapy   Maintenance Herceptin   01/26/2021 -  Radiation Therapy   Adjuvant radiation therapy (Dr. Lisbeth Renshaw)     CURRENT THERAPY: Herceptin maintenance  INTERVAL HISTORY: Geneen Dieter 65 y.o. female returns for evaluation prior to receiving every 3 week Herceptin.  She is tolerating her treatment well.  She notes that she developed diarrhea yesterday.  She has taken imodium and lomotil without relief.  She has had 4 episodes of diarrhea since this morning.  She has no sick contacts.  She had diarrhea like this when she was receiving chemotherapy.  She has not taken any recent antibiotics.    She was going to start radiation at the end of July, however, due to soreness and tightness in her right  axilla they rescheduled her start date to 01/26/2021.  Her most recent echo was completed on 11/26/2020 and showed an EF of 55-60%.     Patient Active Problem List   Diagnosis Date Noted   Diarrhea 10/24/2020   Port-A-Cath in place 09/26/2020   Hypokalemia 09/26/2020   Malignant neoplasm of upper-outer quadrant of right breast in female, estrogen receptor positive ([place]) 08/15/2020   Personal history of COVID-19 07/04/2020   Mixed hyperlipidemia 07/04/2020   Diabetes mellitus type 2 in obese ([place]) 04/28/2019   Precordial pain 04/28/2019   DOE (dyspnea on exertion) 04/28/2019   Tendonitis, Achilles, right 01/17/2018   Heel pain 09/26/2015   Plantar fasciitis of right foot 10/25/2014   Equinus deformity of foot, acquired 10/25/2014   Tarsal tunnel syndrome 10/25/2014   Deformity of metatarsal bone of right foot 10/25/2014   Genital herpes, unspecified 12/21/2012   DIABETES MELLITUS II, UNCOMPLICATED 20/91/9802   OBESITY, NOS 08/25/2006   ANEMIA, ACUTE BLOOD LOSS 08/25/2006   DEPRESSION, MAJOR, RECURRENT 08/25/2006   HEADACHE, UNSPECIFIED 08/25/2006    is allergic to penicillins.  MEDICAL HISTORY: Past Medical History:  Diagnosis Date   Abnormal Pap smear of cervix 2014   ASCUS with negative HR HPV    Anemia    Breast cancer ([place]) 11/2020   Cancer ([facility]) 06/2020   right breast IDC with DCIS   Cataract    Diabetes mellitus without complication ([place])    Hyperlipidemia    on medicine   Plantar fasciitis    STD (sexually transmitted disease)  HSV type II    SURGICAL HISTORY: Past Surgical History:  Procedure Laterality Date   BREAST LUMPECTOMY WITH RADIOACTIVE SEED AND SENTINEL LYMPH NODE BIOPSY Right 12/18/2020   Procedure: RIGHT BREAST LUMPECTOMY WITH RADIOACTIVE SEED AND RIGHT AXILLARY SENTINEL LYMPH NODE BIOPSY;  Surgeon: Rolm Bookbinder, MD;  Location: [facility];  Service: General;  Laterality: Right;   CATARACT EXTRACTION  2019   COLONOSCOPY     COLPOSCOPY W/ BIOPSY  / CURETTAGE  02/04/1999   Chronic cervicitis   EYE SURGERY     PORTACATH PLACEMENT N/A 08/26/2020   Procedure: INSERTION PORT-A-CATH;  Surgeon: Rolm Bookbinder, MD;  Location: [facility];  Service: General;  Laterality: N/A;  START TIME OF 12:30 PM FOR 60 MINUTES ROOM 8    SOCIAL HISTORY: Social History   Socioeconomic History   Marital status: Single    Spouse name: Not on file   Number of children: Not on file   Years of education: Not on file   Highest education level: Not on file  Occupational History   Not on file  Tobacco Use   Smoking status: Never   Smokeless tobacco: Never  Vaping Use   Vaping Use: Never used  Substance and Sexual Activity   Alcohol use: No    Alcohol/week: 0.0 standard drinks   Drug use: No   Sexual activity: Not Currently    Birth control/protection: Post-menopausal  Other Topics Concern   Not on file  Social History Narrative   Not on file   Social Determinants of Health   Financial Resource Strain: Not on file  Food Insecurity: Not on file  Transportation Needs: Not on file  Physical Activity: Not on file  Stress: Not on file  Social Connections: Not on file  Intimate Partner Violence: Not At Risk   Fear of Current or Ex-Partner: No   Emotionally Abused: No   Physically Abused: No   Sexually Abused: No    FAMILY HISTORY: Family History  Problem Relation Age of Onset   Diabetes Mother    Diabetes Father    Heart disease Father    Cancer Father        Prostate   Heart failure Father    Heart disease Sister    Diabetes Sister    Diabetes Sister    Heart disease Sister    Heart failure Sister    Thyroid disease Sister    Diabetes Sister    Diabetes Sister    Diabetes Sister    Diabetes Sister    Diabetes Sister    Diabetes Brother    Cancer Brother        lung cancer   Lupus Daughter    Colon cancer Neg Hx    Colon polyps Neg Hx    Rectal cancer Neg Hx    Stomach cancer Neg Hx    Esophageal cancer Neg  Hx     Review of Systems  Constitutional:  Positive for fatigue. Negative for appetite change, chills, fever and unexpected weight change.  HENT:   Negative for hearing loss, lump/mass and trouble swallowing.   Eyes:  Negative for eye problems and icterus.  Respiratory:  Positive for shortness of breath (occasional). Negative for chest tightness and cough.   Cardiovascular:  Negative for chest pain, leg swelling and palpitations.  Gastrointestinal:  Positive for diarrhea. Negative for abdominal distention, abdominal pain, constipation, nausea and vomiting.  Endocrine: Negative for hot flashes.  Genitourinary:  Negative for difficulty urinating.  Musculoskeletal:  Negative for arthralgias.  Skin:  Negative for itching and rash.  Neurological:  Negative for dizziness, extremity weakness, headaches and numbness.  Hematological:  Negative for adenopathy. Does not bruise/bleed easily.  Psychiatric/Behavioral:  Negative for depression. The patient is not nervous/anxious.      PHYSICAL EXAMINATION  ECOG PERFORMANCE STATUS: 1 - Symptomatic but completely ambulatory  Vitals:   01/23/21 1402 01/23/21 1403  BP: (!) 123/47 (!) 120/46  Pulse: 69   Resp: 18   Temp: 97.7 F (36.5 C)   SpO2: 97%     Physical Exam Constitutional:      General: She is not in acute distress.    Appearance: Normal appearance. She is not toxic-appearing.  HENT:     Head: Normocephalic and atraumatic.  Eyes:     General: No scleral icterus. Cardiovascular:     Rate and Rhythm: Normal rate and regular rhythm.     Pulses: Normal pulses.     Heart sounds: Normal heart sounds.  Pulmonary:     Effort: Pulmonary effort is normal.     Breath sounds: Normal breath sounds.  Abdominal:     General: Abdomen is flat. Bowel sounds are normal. There is no distension.     Palpations: Abdomen is soft.     Tenderness: There is no abdominal tenderness.  Musculoskeletal:        General: No swelling.     Cervical back:  Neck supple.  Lymphadenopathy:     Cervical: No cervical adenopathy.  Skin:    General: Skin is warm and dry.     Findings: No rash.  Neurological:     General: No focal deficit present.     Mental Status: She is alert.  Psychiatric:        Mood and Affect: Mood normal.        Behavior: Behavior normal.    LABORATORY DATA:  CBC    Component Value Date/Time   WBC 7.3 01/23/2021 1351   WBC 5.8 11/04/2017 0915   RBC 4.82 01/23/2021 1351   HGB 13.3 01/23/2021 1351   HCT 40.1 01/23/2021 1351   PLT 217 01/23/2021 1351   MCV 83.2 01/23/2021 1351   MCH 27.6 01/23/2021 1351   MCHC 33.2 01/23/2021 1351   RDW 14.4 01/23/2021 1351   LYMPHSABS 2.3 01/23/2021 1351   MONOABS 0.5 01/23/2021 1351   EOSABS 0.1 01/23/2021 1351   BASOSABS 0.0 01/23/2021 1351    CMP     Component Value Date/Time   NA 144 01/23/2021 1351   NA 142 06/18/2019 0956   K 4.0 01/23/2021 1351   CL 109 01/23/2021 1351   CO2 29 01/23/2021 1351   GLUCOSE 142 (H) 01/23/2021 1351   BUN 7 (L) 01/23/2021 1351   BUN 10 06/18/2019 0956   CREATININE 0.79 01/23/2021 1351   CALCIUM 9.8 01/23/2021 1351   PROT 7.1 01/23/2021 1351   ALBUMIN 3.8 01/23/2021 1351   AST 16 01/23/2021 1351   ALT 11 01/23/2021 1351   ALKPHOS 93 01/23/2021 1351   BILITOT 0.9 01/23/2021 1351   GFRNONAA >60 01/23/2021 1351   GFRAA 104 06/18/2019 0956        ASSESSMENT and THERAPY PLAN:   Malignant neoplasm of upper-outer quadrant of right breast in female, estrogen receptor positive ([place]) 08/08/2020:Screening detected right breast mass 2.2 cm by ultrasound upper outer quadrant biopsy: Grade 2-3 IDC with DCIS ER 60%, PR 20%, Ki-67 15%, HER-2 +3+ T2N0 stage Ia Treatment plan: 1.  Neoadjuvant  chemotherapy with Taxol and Herceptin weekly x12 followed by Herceptin maintenance 2. breast conserving surgery with some [place] biopsy 3.  Adjuvant radiation 4.  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 Diarrhea: Increased recently.  Added Colestipol 2. Mild fatigue 3. Hypokalemia-monitoring closely 4. Decreased appetite/weight loss 5. Peripheral Neuropathy: taxol has completed.  Stable symptoms.   MRI breast 11/17/2020: Right breast malignancy 2.2 cm, 5 mm FA (MRI biopsy: Benign fat necrosis)  Ordered repeat echocardiogram.     Return to clinic every 3 weeks for Herceptin every 6 weeks to follow-up with labs   Orders Placed This Encounter  Procedures   ECHOCARDIOGRAM COMPLETE    Standing Status:   Future    Standing Expiration Date:   01/23/2022    Order Specific Question:   Where should this test be performed    Answer:   [facility]    Order Specific Question:   Perflutren DEFINITY (image enhancing agent) should be administered unless hypersensitivity or allergy exist    Answer:   Administer Perflutren    Order Specific Question:   Reason for exam-Echo    Answer:   Chemo  Z09    All questions were answered. The patient knows to call the clinic with any problems, questions or concerns. We can certainly see the patient much sooner if necessary.  Total encounter time: 20 minutes in face to face visit time, lab review, chart review, order entry and documentation of the encounter.   Wilber Bihari, NP 01/25/21 12:13 PM Medical Oncology and Hematology [facility] [place], [place] [address] Tel. [phone]    Fax. [phone]  *Total Encounter Time as defined by the Centers for Medicare and Medicaid Services includes, in addition to the face-to-face time of a patient visit (documented in the note above) non-face-to-face time: obtaining and reviewing outside history, ordering and reviewing medications, tests or procedures, care coordination (communications with other health care professionals or caregivers) and  documentation in the medical record.

## 2021-01-23 NOTE — Assessment & Plan Note (Addendum)
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 Diarrhea:Increased recently.  Added Colestipol 2. Mild fatigue 3. Hypokalemia-monitoring closely 4. Decreased appetite/weight loss 5. Peripheral Neuropathy: taxol has completed.Stable symptoms.  MRI breast 11/17/2020: Right breast malignancy 2.2 cm, 5 mm FA (MRI biopsy: Benign fat necrosis)  Ordered repeat echocardiogram.    Return to clinic every 3 weeks for Herceptin every 6 weeks to follow-up with labs

## 2021-01-23 NOTE — Patient Instructions (Signed)
[facility]  Discharge Instructions: Thank you for choosing [facility] to provide your oncology and hematology care.   If you have a lab appointment with the [facility], please go directly to the [facility] and check in at the registration area.   Wear comfortable clothing and clothing appropriate for easy access to any Portacath or PICC line.   We strive to give you quality time with your provider. You may need to reschedule your appointment if you arrive late (15 or more minutes).  Arriving late affects you and other patients whose appointments are after yours.  Also, if you miss three or more appointments without notifying the office, you may be dismissed from the clinic at the provider's discretion.      For prescription refill requests, have your pharmacy contact our office and allow 72 hours for refills to be completed.    Today you received the following chemotherapy and/or immunotherapy agents trastuzumab and IV hydration      To help prevent nausea and vomiting after your treatment, we encourage you to take your nausea medication as directed.  BELOW ARE SYMPTOMS THAT SHOULD BE REPORTED IMMEDIATELY: *FEVER GREATER THAN 100.4 F (38 C) OR HIGHER *CHILLS OR SWEATING *NAUSEA AND VOMITING THAT IS NOT CONTROLLED WITH YOUR NAUSEA MEDICATION *UNUSUAL SHORTNESS OF BREATH *UNUSUAL BRUISING OR BLEEDING *URINARY PROBLEMS (pain or burning when urinating, or frequent urination) *BOWEL PROBLEMS (unusual diarrhea, constipation, pain near the anus) TENDERNESS IN MOUTH AND THROAT WITH OR WITHOUT PRESENCE OF ULCERS (sore throat, sores in mouth, or a toothache) UNUSUAL RASH, SWELLING OR PAIN  UNUSUAL VAGINAL DISCHARGE OR ITCHING   Items with * indicate a potential emergency and should be followed up as soon as possible or go to the Emergency Department if any problems should occur.  Please show the CHEMOTHERAPY ALERT CARD or IMMUNOTHERAPY ALERT  CARD at check-in to the Emergency Department and triage nurse.  Should you have questions after your visit or need to cancel or reschedule your appointment, please contact [facility]  Dept: [phone]  and follow the prompts.  Office hours are 8:00 a.m. to 4:30 p.m. Monday - Friday. Please note that voicemails left after 4:00 p.m. may not be returned until the following business day.  We are closed weekends and major holidays. You have access to a nurse at all times for urgent questions. Please call the main number to the clinic Dept: [phone] and follow the prompts.   For any non-urgent questions, you may also contact your provider using MyChart. We now offer e-Visits for anyone 85 and older to request care online for non-urgent symptoms. For details visit [email or web address].   Also download the MyChart app! Go to the app store, search "MyChart", open the app, select [facility], and log in with your MyChart username and password.  Due to Covid, a mask is required upon entering the hospital/clinic. If you do not have a mask, one will be given to you upon arrival. For doctor visits, patients may have 1 support person aged 27 or older with them. For treatment visits, patients cannot have anyone with them due to current Covid guidelines and our immunocompromised population.

## 2021-01-25 ENCOUNTER — Encounter: Payer: Self-pay | Admitting: Hematology and Oncology

## 2021-01-26 ENCOUNTER — Ambulatory Visit: Payer: 59

## 2021-01-26 ENCOUNTER — Ambulatory Visit
Admission: RE | Admit: 2021-01-26 | Discharge: 2021-01-26 | Disposition: A | Discharge status: Home / Self Care | Payer: 59 | Source: Ambulatory Visit | Attending: Radiation Oncology | Admitting: Radiation Oncology

## 2021-01-26 ENCOUNTER — Other Ambulatory Visit (HOSPITAL_COMMUNITY): Payer: Self-pay

## 2021-01-26 ENCOUNTER — Other Ambulatory Visit: Payer: Self-pay

## 2021-01-26 DIAGNOSIS — R293 Abnormal posture: Secondary | ICD-10-CM | POA: Insufficient documentation

## 2021-01-26 DIAGNOSIS — Z5112 Encounter for antineoplastic immunotherapy: Secondary | ICD-10-CM | POA: Diagnosis not present

## 2021-01-26 DIAGNOSIS — E876 Hypokalemia: Secondary | ICD-10-CM | POA: Diagnosis not present

## 2021-01-26 DIAGNOSIS — Z170 Estrogen receptor positive status [ER+]: Secondary | ICD-10-CM | POA: Insufficient documentation

## 2021-01-26 DIAGNOSIS — Z510 Encounter for antineoplastic radiation therapy: Secondary | ICD-10-CM | POA: Insufficient documentation

## 2021-01-26 DIAGNOSIS — G620 Drug-induced polyneuropathy: Secondary | ICD-10-CM | POA: Diagnosis not present

## 2021-01-26 DIAGNOSIS — R634 Abnormal weight loss: Secondary | ICD-10-CM | POA: Diagnosis not present

## 2021-01-26 DIAGNOSIS — Z801 Family history of malignant neoplasm of trachea, bronchus and lung: Secondary | ICD-10-CM | POA: Diagnosis not present

## 2021-01-26 DIAGNOSIS — M25511 Pain in right shoulder: Secondary | ICD-10-CM

## 2021-01-26 DIAGNOSIS — R5383 Other fatigue: Secondary | ICD-10-CM | POA: Diagnosis not present

## 2021-01-26 DIAGNOSIS — T451X5D Adverse effect of antineoplastic and immunosuppressive drugs, subsequent encounter: Secondary | ICD-10-CM | POA: Diagnosis not present

## 2021-01-26 DIAGNOSIS — C50411 Malignant neoplasm of upper-outer quadrant of right female breast: Secondary | ICD-10-CM | POA: Insufficient documentation

## 2021-01-26 DIAGNOSIS — R197 Diarrhea, unspecified: Secondary | ICD-10-CM | POA: Diagnosis not present

## 2021-01-26 DIAGNOSIS — Z880 Allergy status to penicillin: Secondary | ICD-10-CM | POA: Diagnosis not present

## 2021-01-26 NOTE — Therapy (Signed)
[place], [place], [address] Phone: [phone]   Fax:  [phone]  Physical Therapy Treatment  Patient Details  Name: Rachel Gaines MRN: [id number] Date of Birth: March 19, 1956 Referring Provider (PT): Dr. Donne Hazel   Encounter Date: 01/26/2021   PT End of Session - 01/26/21 0951     Visit Number 2    Number of Visits 14    Date for PT Re-Evaluation 03/09/21    PT Start Time 0859    PT Stop Time 0943    PT Time Calculation (min) 44 min    Activity Tolerance Patient tolerated treatment well    Behavior During Therapy [facility] for tasks assessed/performed             Past Medical History:  Diagnosis Date   Abnormal Pap smear of cervix 2014   ASCUS with negative HR HPV    Anemia    Breast cancer ([place]) 11/2020   Cancer ([place]) 06/2020   right breast IDC with DCIS   Cataract    Diabetes mellitus without complication ([place])    Hyperlipidemia    on medicine   Plantar fasciitis    STD (sexually transmitted disease)    HSV type II    Past Surgical History:  Procedure Laterality Date   BREAST LUMPECTOMY WITH RADIOACTIVE SEED AND SENTINEL LYMPH NODE BIOPSY Right 12/18/2020   Procedure: RIGHT BREAST LUMPECTOMY WITH RADIOACTIVE SEED AND RIGHT AXILLARY SENTINEL LYMPH NODE BIOPSY;  Surgeon: Rolm Bookbinder, MD;  Location: [place];  Service: General;  Laterality: Right;   CATARACT EXTRACTION  2019   COLONOSCOPY     COLPOSCOPY W/ BIOPSY / CURETTAGE  02/04/1999   Chronic cervicitis   EYE SURGERY     PORTACATH PLACEMENT N/A 08/26/2020   Procedure: INSERTION PORT-A-CATH;  Surgeon: Rolm Bookbinder, MD;  Location: [place];  Service: General;  Laterality: N/A;  START TIME OF 12:30 PM FOR 60 MINUTES ROOM 8    There were no vitals filed for this visit.   Subjective Assessment - 01/26/21 0903     Subjective Pt had surgery for right lumpectomy on 12/18/2020.  She starts radiation today at  10:00.  She is sore under her arm and feels swollen.  She has trouble reaching for things, carrying,  Having some trouble sleeping.  Arm is sore and stiff.    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  Pt had right lumpectomy with SLNB on 12/18/2020    Patient Stated Goals Reassessment after surgery    Currently in Pain? Yes    Pain Location Axilla    Pain Orientation Right    Pain Descriptors / Indicators Sore;Tightness    Pain Type Surgical pain    Pain Onset More than a month ago    Pain Frequency Intermittent    Aggravating Factors  Raising arm to reach for something    Pain Relieving Factors rest    Effect of Pain on Daily Activities limiting use of arm                [facility] PT Assessment - 01/26/21 0001       Assessment   Medical Diagnosis Right breast CA    Referring Provider (PT) Dr. Donne Hazel    Onset Date/Surgical Date 12/18/20    Prior Therapy 1 visit      Precautions   Precaution Comments lymphedema risk  Restrictions   Weight Bearing Restrictions No      Balance Screen   Has the patient fallen in the past 6 months No    Has the patient had a decrease in activity level because of a fear of falling?  No    Is the patient reluctant to leave their home because of a fear of falling?  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, blood work, [employer]    Leisure pt walks dog 2x/day for about 20 min      Cognition   Overall Cognitive Status Within Functional Limits for tasks assessed      Observation/Other Assessments    Observations incisions healed with mild scar tissue beneath, no visible breast swelling      Posture/Postural Control   Posture/Postural Control Postural limitations    Postural Limitations Rounded Shoulders;Forward head      AROM   AROM Assessment Site Shoulder    Right/Left Shoulder Right;Left    Right Shoulder Extension 33 Degrees    Right Shoulder Flexion 82 Degrees   PROM 87   Right Shoulder ABduction 65 Degrees    Left Shoulder Flexion 160 Degrees    Left Shoulder ABduction 170 Degrees      Palpation   Palpation comment Tender at pecs, lats, UT, medial upper arm and medial elbow               LYMPHEDEMA/ONCOLOGY QUESTIONNAIRE - 01/26/21 0001       Type   Cancer Type R breast cancer      Surgeries   Lumpectomy Date 12/18/20    Sentinel Lymph Node Biopsy Date 12/18/20    Number Lymph Nodes Removed 2   ?     Treatment   Active Chemotherapy Treatment No    Past Chemotherapy Treatment Yes    Date 12/12/20    Active Radiation Treatment Yes    Date 01/26/21    Past Radiation Treatment No    Current Hormone Treatment No    Past Hormone Therapy No      What other symptoms do you have   Are you Having Heaviness or Tightness Yes    Are you having Pain Yes    Is it Hard or Difficult finding clothes that fit No    Do you have infections No      Right Upper Extremity Lymphedema   15 cm Proximal to Olecranon Process 29.6 cm    Olecranon Process 24.5 cm    15 cm Proximal to Ulnar Styloid Process 22.8 cm    Just Proximal to Ulnar Styloid Process 15.5 cm    Across Hand at [employer] 19.5 cm    At [place] of 2nd Digit 6.2 cm      Left Upper Extremity Lymphedema   15 cm Proximal to Olecranon Process 30.5 cm    Olecranon Process 24.7 cm    15 cm Proximal to Ulnar Styloid Process 22.2 cm    Just Proximal to Ulnar Styloid Process 15.6 cm    Across Hand at [employer]  18.9 cm    At [facility] of 2nd Digit 6.4 cm                Quick Dash - 01/26/21 0001      Open a tight or new jar Moderate difficulty    Do heavy household chores (wash walls, wash floors) Moderate difficulty    Carry a shopping bag or briefcase Moderate difficulty    Wash your back Severe difficulty    Use a knife to cut food Moderate difficulty    Recreational activities in which you take some force or impact through your arm, shoulder, or hand (golf, hammering, tennis) Severe difficulty    During the past week, to what extent has your arm, shoulder or hand problem interfered with your normal social activities with family, friends, neighbors, or groups? Modererately    During the past week, to what extent has your arm, shoulder or hand problem limited your work or other regular daily activities Slightly    Arm, shoulder, or hand pain. Moderate    Tingling (pins and needles) in your arm, shoulder, or hand Severe    Difficulty Sleeping Moderate difficulty    DASH Score 54.55 %                            PT Education - 01/26/21 0950     Education Details 4 post op exercises    Person(s) Educated Patient    Methods Demonstration;Handout    Comprehension Returned demonstration                 PT Long Term Goals - 01/26/21 0959       PT LONG TERM GOAL #1   Title Pt will return to baseline shoudler ROM measurements and not demonstrate any signs of lymphedema.    Time 6    Period Weeks    Status New    Target Date 03/09/21      PT LONG TERM GOAL #2   Title Pt will report decreased pain by 50% or greater    Time 6    Period Weeks    Status New    Target Date 03/09/21      PT LONG TERM GOAL #3   Title Pt will have right shoulder flexion and abd atleast 125 degrees for improved home activies    Time 3    Period Weeks    Status New    Target Date 02/16/21      PT LONG TERM GOAL #4   Title Quick dash will be no greater than 18%    Time 6    Period Weeks    Status New    Target Date 03/09/21      PT LONG TERM GOAL #5   Title Pt will  attend ABC class for education in exercises and lymphedema    Time 6    Period Weeks    Status New    Target Date 03/09/21                   Plan - 01/26/21 [id number]     Clinical Impression Statement Pt is here for post-surgical follow up after having Right lumpectomy on 12/18/20 with neoadjuvant chemotherapy.  She has done her CT simulation and is starting radiation today through August 26.  Incisions are healed with mild scar tissue beneath. There is no visible breast swelling and no significant difference in arm circumference.  Her  right shoulder is limited in all directions and pt has difficulty relaxing.  She was instructed in AAROM of her shoulder for flexion and stargazer in supine, standing wall slides, and sitting or standing scapular retraction. She is tender throughout the right upper quarter and medial arm and forearm but no cording is palpable presently    Personal Factors and Comorbidities Comorbidity 1    Comorbidities diabetes    Examination-Activity Limitations Lift;Reach Overhead;Sleep    Examination-Participation Restrictions Occupation    Stability/Clinical Decision Making Stable/Uncomplicated    Clinical Decision Making Low    Rehab Potential Excellent    PT Frequency 2x / week    PT Duration 6 weeks    PT Treatment/Interventions ADLs/Self Care Home Management;Therapeutic exercise;Patient/family education;Scar mobilization;Passive range of motion;Manual lymph drainage;Manual techniques;Orthotic Fit/Training    PT Next Visit Plan Sign up ABC class, SOZO every 3 months (pt had to get to radiation today) AAROM and PROM right shoulder, STM right upper quarter    PT Home Exercise Plan post op breast exercises    Recommended Other Services SOZO, sleeve?    Consulted and Agree with Plan of Care Patient             Patient will benefit from skilled therapeutic intervention in order to improve the following deficits and impairments:  Postural dysfunction, Decreased  knowledge of precautions, Pain, Decreased scar mobility, Decreased range of motion, Decreased strength, Impaired UE functional use  Visit Diagnosis: Abnormal posture  Malignant neoplasm of upper-outer quadrant of right breast in female, estrogen receptor positive (HCC)  Acute pain of right shoulder     Problem List Patient Active Problem List   Diagnosis Date Noted   Diarrhea 10/24/2020   Port-A-Cath in place 09/26/2020   Hypokalemia 09/26/2020   Malignant neoplasm of upper-outer quadrant of right breast in female, estrogen receptor positive ([place]) 08/15/2020   Personal history of COVID-19 07/04/2020   Mixed hyperlipidemia 07/04/2020   Diabetes mellitus type 2 in obese ([place]) 04/28/2019   Precordial pain 04/28/2019   DOE (dyspnea on exertion) 04/28/2019   Tendonitis, Achilles, right 01/17/2018   Heel pain 09/26/2015   Plantar fasciitis of right foot 10/25/2014   Equinus deformity of foot, acquired 10/25/2014   Tarsal tunnel syndrome 10/25/2014   Deformity of metatarsal bone of right foot 10/25/2014   Genital herpes, unspecified 12/21/2012   DIABETES MELLITUS II, UNCOMPLICATED [id number]   OBESITY, NOS 08/25/2006   ANEMIA, ACUTE BLOOD LOSS 08/25/2006   DEPRESSION, MAJOR, RECURRENT 08/25/2006   HEADACHE, UNSPECIFIED 08/25/2006    Rachel Gaines 01/26/2021, 11:13 AM  [place], [place], [address] Phone: [phone]   Fax:  [phone]  Name: Rachel Gaines MRN: [id number] Date of Birth: 08-Aug-1955 Cheral Almas, PT 01/26/21 11:14 AM

## 2021-01-26 NOTE — Patient Instructions (Signed)
Patient was instructed today in a home exercise program today for post op shoulder range of motion. These included active assist shoulder flexion in sitting/supine, scapular retraction, wall walking or slide with shoulder abduction, and hands behind head external rotation in supine.  She was encouraged to do these twice a day, holding 3 seconds and repeating 5 times. Advised to do relaxation breathing, listen to relaxing music etc

## 2021-01-27 ENCOUNTER — Ambulatory Visit
Admission: RE | Admit: 2021-01-27 | Discharge: 2021-01-27 | Disposition: A | Discharge status: Home / Self Care | Payer: 59 | Source: Ambulatory Visit | Attending: Radiation Oncology | Admitting: Radiation Oncology

## 2021-01-27 DIAGNOSIS — T451X5D Adverse effect of antineoplastic and immunosuppressive drugs, subsequent encounter: Secondary | ICD-10-CM | POA: Diagnosis not present

## 2021-01-27 DIAGNOSIS — Z170 Estrogen receptor positive status [ER+]: Secondary | ICD-10-CM | POA: Diagnosis not present

## 2021-01-27 DIAGNOSIS — R197 Diarrhea, unspecified: Secondary | ICD-10-CM | POA: Diagnosis not present

## 2021-01-27 DIAGNOSIS — C50411 Malignant neoplasm of upper-outer quadrant of right female breast: Secondary | ICD-10-CM | POA: Diagnosis not present

## 2021-01-27 DIAGNOSIS — Z5112 Encounter for antineoplastic immunotherapy: Secondary | ICD-10-CM | POA: Diagnosis not present

## 2021-01-27 DIAGNOSIS — G620 Drug-induced polyneuropathy: Secondary | ICD-10-CM | POA: Diagnosis not present

## 2021-01-27 DIAGNOSIS — R634 Abnormal weight loss: Secondary | ICD-10-CM | POA: Diagnosis not present

## 2021-01-27 DIAGNOSIS — E876 Hypokalemia: Secondary | ICD-10-CM | POA: Diagnosis not present

## 2021-01-27 DIAGNOSIS — Z510 Encounter for antineoplastic radiation therapy: Secondary | ICD-10-CM | POA: Diagnosis not present

## 2021-01-28 ENCOUNTER — Other Ambulatory Visit (HOSPITAL_COMMUNITY): Payer: Self-pay

## 2021-01-28 ENCOUNTER — Other Ambulatory Visit: Payer: Self-pay

## 2021-01-28 ENCOUNTER — Ambulatory Visit
Admission: RE | Admit: 2021-01-28 | Discharge: 2021-01-28 | Disposition: A | Discharge status: Home / Self Care | Payer: 59 | Source: Ambulatory Visit | Attending: Radiation Oncology | Admitting: Radiation Oncology

## 2021-01-28 DIAGNOSIS — C50411 Malignant neoplasm of upper-outer quadrant of right female breast: Secondary | ICD-10-CM | POA: Diagnosis not present

## 2021-01-28 DIAGNOSIS — Z5112 Encounter for antineoplastic immunotherapy: Secondary | ICD-10-CM | POA: Diagnosis not present

## 2021-01-28 DIAGNOSIS — T451X5D Adverse effect of antineoplastic and immunosuppressive drugs, subsequent encounter: Secondary | ICD-10-CM | POA: Diagnosis not present

## 2021-01-28 DIAGNOSIS — R634 Abnormal weight loss: Secondary | ICD-10-CM | POA: Diagnosis not present

## 2021-01-28 DIAGNOSIS — E876 Hypokalemia: Secondary | ICD-10-CM | POA: Diagnosis not present

## 2021-01-28 DIAGNOSIS — Z510 Encounter for antineoplastic radiation therapy: Secondary | ICD-10-CM | POA: Diagnosis not present

## 2021-01-28 DIAGNOSIS — G620 Drug-induced polyneuropathy: Secondary | ICD-10-CM | POA: Diagnosis not present

## 2021-01-28 DIAGNOSIS — R197 Diarrhea, unspecified: Secondary | ICD-10-CM | POA: Diagnosis not present

## 2021-01-28 DIAGNOSIS — Z170 Estrogen receptor positive status [ER+]: Secondary | ICD-10-CM | POA: Diagnosis not present

## 2021-01-29 ENCOUNTER — Ambulatory Visit
Admission: RE | Admit: 2021-01-29 | Discharge: 2021-01-29 | Disposition: A | Discharge status: Home / Self Care | Payer: 59 | Source: Ambulatory Visit | Attending: Radiation Oncology | Admitting: Radiation Oncology

## 2021-01-29 DIAGNOSIS — C50411 Malignant neoplasm of upper-outer quadrant of right female breast: Secondary | ICD-10-CM | POA: Diagnosis not present

## 2021-01-29 DIAGNOSIS — E876 Hypokalemia: Secondary | ICD-10-CM | POA: Diagnosis not present

## 2021-01-29 DIAGNOSIS — R197 Diarrhea, unspecified: Secondary | ICD-10-CM | POA: Diagnosis not present

## 2021-01-29 DIAGNOSIS — T451X5D Adverse effect of antineoplastic and immunosuppressive drugs, subsequent encounter: Secondary | ICD-10-CM | POA: Diagnosis not present

## 2021-01-29 DIAGNOSIS — Z170 Estrogen receptor positive status [ER+]: Secondary | ICD-10-CM | POA: Diagnosis not present

## 2021-01-29 DIAGNOSIS — Z5112 Encounter for antineoplastic immunotherapy: Secondary | ICD-10-CM | POA: Diagnosis not present

## 2021-01-29 DIAGNOSIS — G620 Drug-induced polyneuropathy: Secondary | ICD-10-CM | POA: Diagnosis not present

## 2021-01-29 DIAGNOSIS — Z510 Encounter for antineoplastic radiation therapy: Secondary | ICD-10-CM | POA: Diagnosis not present

## 2021-01-29 DIAGNOSIS — R634 Abnormal weight loss: Secondary | ICD-10-CM | POA: Diagnosis not present

## 2021-01-29 NOTE — Progress Notes (Signed)
Pt here for patient teaching.  Pt given Radiation and You booklet, skin care instructions, [place] deodorant, and Radiaplex gel.  Reviewed areas of pertinence such as fatigue, hair loss, skin changes, breast tenderness, and breast swelling . Pt able to give teach back of to pat skin and use unscented/gentle soap,apply Radiaplex bid, avoid applying anything to skin within 4 hours of treatment, avoid wearing an under wire bra, and to use an electric razor if they must shave. Pt verbalizes understanding of information given and will contact nursing with any questions or concerns.     Http://rtanswers.org/treatmentinformation/whattoexpect/index  Marlie Kuennen M. Laurens Matheny RN, BSN       

## 2021-01-30 ENCOUNTER — Ambulatory Visit
Admission: RE | Admit: 2021-01-30 | Discharge: 2021-01-30 | Disposition: A | Discharge status: Home / Self Care | Payer: 59 | Source: Ambulatory Visit | Attending: Radiation Oncology | Admitting: Radiation Oncology

## 2021-01-30 DIAGNOSIS — R197 Diarrhea, unspecified: Secondary | ICD-10-CM | POA: Diagnosis not present

## 2021-01-30 DIAGNOSIS — T451X5D Adverse effect of antineoplastic and immunosuppressive drugs, subsequent encounter: Secondary | ICD-10-CM | POA: Diagnosis not present

## 2021-01-30 DIAGNOSIS — C50411 Malignant neoplasm of upper-outer quadrant of right female breast: Secondary | ICD-10-CM | POA: Diagnosis not present

## 2021-01-30 DIAGNOSIS — E876 Hypokalemia: Secondary | ICD-10-CM | POA: Diagnosis not present

## 2021-01-30 DIAGNOSIS — C50911 Malignant neoplasm of unspecified site of right female breast: Secondary | ICD-10-CM | POA: Diagnosis not present

## 2021-01-30 DIAGNOSIS — R634 Abnormal weight loss: Secondary | ICD-10-CM | POA: Diagnosis not present

## 2021-01-30 DIAGNOSIS — Z170 Estrogen receptor positive status [ER+]: Secondary | ICD-10-CM | POA: Diagnosis not present

## 2021-01-30 DIAGNOSIS — Z510 Encounter for antineoplastic radiation therapy: Secondary | ICD-10-CM | POA: Diagnosis not present

## 2021-01-30 DIAGNOSIS — Z5112 Encounter for antineoplastic immunotherapy: Secondary | ICD-10-CM | POA: Diagnosis not present

## 2021-01-30 DIAGNOSIS — G620 Drug-induced polyneuropathy: Secondary | ICD-10-CM | POA: Diagnosis not present

## 2021-01-30 MED ORDER — ALRA NON-METALLIC DEODORANT (RAD-ONC)
1.0000 "application " | Freq: Once | TOPICAL | Status: AC
  Administered 2021-01-30: 1 via TOPICAL

## 2021-01-30 MED ORDER — RADIAPLEXRX EX GEL: Freq: Once | CUTANEOUS | Status: AC

## 2021-02-02 ENCOUNTER — Other Ambulatory Visit: Payer: Self-pay

## 2021-02-02 ENCOUNTER — Ambulatory Visit
Admission: RE | Admit: 2021-02-02 | Discharge: 2021-02-02 | Disposition: A | Discharge status: Home / Self Care | Payer: 59 | Source: Ambulatory Visit | Attending: Radiation Oncology | Admitting: Radiation Oncology

## 2021-02-02 DIAGNOSIS — R197 Diarrhea, unspecified: Secondary | ICD-10-CM | POA: Diagnosis not present

## 2021-02-02 DIAGNOSIS — Z5112 Encounter for antineoplastic immunotherapy: Secondary | ICD-10-CM | POA: Diagnosis not present

## 2021-02-02 DIAGNOSIS — T451X5D Adverse effect of antineoplastic and immunosuppressive drugs, subsequent encounter: Secondary | ICD-10-CM | POA: Diagnosis not present

## 2021-02-02 DIAGNOSIS — R634 Abnormal weight loss: Secondary | ICD-10-CM | POA: Diagnosis not present

## 2021-02-02 DIAGNOSIS — C50411 Malignant neoplasm of upper-outer quadrant of right female breast: Secondary | ICD-10-CM | POA: Diagnosis not present

## 2021-02-02 DIAGNOSIS — G620 Drug-induced polyneuropathy: Secondary | ICD-10-CM | POA: Diagnosis not present

## 2021-02-02 DIAGNOSIS — Z510 Encounter for antineoplastic radiation therapy: Secondary | ICD-10-CM | POA: Diagnosis not present

## 2021-02-02 DIAGNOSIS — Z170 Estrogen receptor positive status [ER+]: Secondary | ICD-10-CM | POA: Diagnosis not present

## 2021-02-02 DIAGNOSIS — E876 Hypokalemia: Secondary | ICD-10-CM | POA: Diagnosis not present

## 2021-02-03 ENCOUNTER — Ambulatory Visit
Admission: RE | Admit: 2021-02-03 | Discharge: 2021-02-03 | Disposition: A | Discharge status: Home / Self Care | Payer: 59 | Source: Ambulatory Visit | Attending: Radiation Oncology | Admitting: Radiation Oncology

## 2021-02-03 ENCOUNTER — Ambulatory Visit: Payer: 59 | Admitting: Physical Therapy

## 2021-02-03 DIAGNOSIS — C50411 Malignant neoplasm of upper-outer quadrant of right female breast: Secondary | ICD-10-CM | POA: Diagnosis not present

## 2021-02-03 DIAGNOSIS — Z170 Estrogen receptor positive status [ER+]: Secondary | ICD-10-CM | POA: Diagnosis not present

## 2021-02-03 DIAGNOSIS — T451X5D Adverse effect of antineoplastic and immunosuppressive drugs, subsequent encounter: Secondary | ICD-10-CM | POA: Diagnosis not present

## 2021-02-03 DIAGNOSIS — R634 Abnormal weight loss: Secondary | ICD-10-CM | POA: Diagnosis not present

## 2021-02-03 DIAGNOSIS — G620 Drug-induced polyneuropathy: Secondary | ICD-10-CM | POA: Diagnosis not present

## 2021-02-03 DIAGNOSIS — Z510 Encounter for antineoplastic radiation therapy: Secondary | ICD-10-CM | POA: Diagnosis not present

## 2021-02-03 DIAGNOSIS — Z5112 Encounter for antineoplastic immunotherapy: Secondary | ICD-10-CM | POA: Diagnosis not present

## 2021-02-03 DIAGNOSIS — R197 Diarrhea, unspecified: Secondary | ICD-10-CM | POA: Diagnosis not present

## 2021-02-03 DIAGNOSIS — E876 Hypokalemia: Secondary | ICD-10-CM | POA: Diagnosis not present

## 2021-02-04 ENCOUNTER — Ambulatory Visit
Admission: RE | Admit: 2021-02-04 | Discharge: 2021-02-04 | Disposition: A | Discharge status: Home / Self Care | Payer: 59 | Source: Ambulatory Visit | Attending: Radiation Oncology | Admitting: Radiation Oncology

## 2021-02-04 ENCOUNTER — Other Ambulatory Visit: Payer: Self-pay

## 2021-02-04 ENCOUNTER — Ambulatory Visit: Payer: 59 | Admitting: Rehabilitation

## 2021-02-04 DIAGNOSIS — G620 Drug-induced polyneuropathy: Secondary | ICD-10-CM | POA: Diagnosis not present

## 2021-02-04 DIAGNOSIS — R197 Diarrhea, unspecified: Secondary | ICD-10-CM | POA: Diagnosis not present

## 2021-02-04 DIAGNOSIS — C50411 Malignant neoplasm of upper-outer quadrant of right female breast: Secondary | ICD-10-CM

## 2021-02-04 DIAGNOSIS — M25511 Pain in right shoulder: Secondary | ICD-10-CM

## 2021-02-04 DIAGNOSIS — R293 Abnormal posture: Secondary | ICD-10-CM

## 2021-02-04 DIAGNOSIS — Z170 Estrogen receptor positive status [ER+]: Secondary | ICD-10-CM | POA: Diagnosis not present

## 2021-02-04 DIAGNOSIS — Z5112 Encounter for antineoplastic immunotherapy: Secondary | ICD-10-CM | POA: Diagnosis not present

## 2021-02-04 DIAGNOSIS — R634 Abnormal weight loss: Secondary | ICD-10-CM | POA: Diagnosis not present

## 2021-02-04 DIAGNOSIS — T451X5D Adverse effect of antineoplastic and immunosuppressive drugs, subsequent encounter: Secondary | ICD-10-CM | POA: Diagnosis not present

## 2021-02-04 DIAGNOSIS — E876 Hypokalemia: Secondary | ICD-10-CM | POA: Diagnosis not present

## 2021-02-04 DIAGNOSIS — Z510 Encounter for antineoplastic radiation therapy: Secondary | ICD-10-CM | POA: Diagnosis not present

## 2021-02-04 NOTE — Therapy (Signed)
[place], [place], [address] Phone: [phone]   Fax:  [phone]  Physical Therapy Treatment  Patient Details  Name: Rachel Gaines MRN: [id number] Date of Birth: 07-20-1955 Referring Provider (PT): Dr. Donne Hazel   Encounter Date: 02/04/2021   PT End of Session - 02/04/21 1349     Visit Number 3    Number of Visits 14    Date for PT Re-Evaluation 03/09/21    PT Start Time 1300    PT Stop Time [id number]    PT Time Calculation (min) 48 min    Activity Tolerance Patient tolerated treatment well;Patient limited by pain    Behavior During Therapy [facility] for tasks assessed/performed             Past Medical History:  Diagnosis Date   Abnormal Pap smear of cervix 2014   ASCUS with negative HR HPV    Anemia    Breast cancer ([place]) 11/2020   Cancer ([place]) 06/2020   right breast IDC with DCIS   Cataract    Diabetes mellitus without complication ([place])    Hyperlipidemia    on medicine   Plantar fasciitis    STD (sexually transmitted disease)    HSV type II    Past Surgical History:  Procedure Laterality Date   BREAST LUMPECTOMY WITH RADIOACTIVE SEED AND SENTINEL LYMPH NODE BIOPSY Right 12/18/2020   Procedure: RIGHT BREAST LUMPECTOMY WITH RADIOACTIVE SEED AND RIGHT AXILLARY SENTINEL LYMPH NODE BIOPSY;  Surgeon: Rolm Bookbinder, MD;  Location: [place];  Service: General;  Laterality: Right;   CATARACT EXTRACTION  2019   COLONOSCOPY     COLPOSCOPY W/ BIOPSY / CURETTAGE  02/04/1999   Chronic cervicitis   EYE SURGERY     PORTACATH PLACEMENT N/A 08/26/2020   Procedure: INSERTION PORT-A-CATH;  Surgeon: Rolm Bookbinder, MD;  Location: [facility];  Service: General;  Laterality: N/A;  START TIME OF 12:30 PM FOR 60 MINUTES ROOM 8    There were no vitals filed for this visit.   Subjective Assessment - 02/04/21 1250     Subjective Radiation really hurts - the position    Pertinent History R  breast cancer, neoadjuvant chemo, grade 2-3 DCIS ER/PR+, will need radiation and antiestrogen treatment, diabetes.  Pt had right lumpectomy with SLNB on 12/18/2020    Patient Stated Goals Reassessment after surgery    Currently in Pain? No/denies                               [facility] Adult PT Treatment/Exercise - 02/04/21 0001       Self-Care   Self-Care Posture    Posture throughout session posture reminders and how to note tension in the Rt side and then trying to relax and perform retraction, scapular motions, change posture, etc as this seems to be the limiting factor currently      Exercises   Exercises Shoulder      Shoulder Exercises: Supine   Other Supine Exercises attempted dowel flexion but with too much trouble relaxing shoulder      Shoulder Exercises: Seated   Other Seated Exercises seated posterior shoulder circles x 10, clock x 5, alternating flexion seated x 5, retraction x 5      Shoulder Exercises: Pulleys   Flexion 2 minutes    Flexion Limitations with cueing for initial performance    Scaption 2 minutes    Scaption Limitations  with cueing for initial performance      Shoulder Exercises: ROM/Strengthening   Other ROM/Strengthening Exercises wall walk x 3 flexion      Manual Therapy   Manual Therapy Passive ROM;Soft tissue mobilization;Joint mobilization    Joint Mobilization Rt shoulder AP grade IV-- prior to pain 3x30" and long axis oscillation for pain relief    Soft tissue mobilization to the Rt UT, anterior shoulder, deltoid in supine and Rt upper quadrant in sidelying with pain and trigger points throughout.  Many vcs for relaxation throughout    Passive ROM as tolerated into flexion and abduction but hard to relax                         PT Long Term Goals - 01/26/21 0959       PT LONG TERM GOAL #1   Title Pt will return to baseline shoudler ROM measurements and not demonstrate any signs of lymphedema.    Time 6     Period Weeks    Status New    Target Date 03/09/21      PT LONG TERM GOAL #2   Title Pt will report decreased pain by 50% or greater    Time 6    Period Weeks    Status New    Target Date 03/09/21      PT LONG TERM GOAL #3   Title Pt will have right shoulder flexion and abd atleast 125 degrees for improved home activies    Time 3    Period Weeks    Status New    Target Date 02/16/21      PT LONG TERM GOAL #4   Title Quick dash will be no greater than 18%    Time 6    Period Weeks    Status New    Target Date 03/09/21      PT LONG TERM GOAL #5   Title Pt will attend ABC class for education in exercises and lymphedema    Time 6    Period Weeks    Status New    Target Date 03/09/21                   Plan - 02/04/21 1350     Clinical Impression Statement First treatment session with limitations in performance of AROM and PROM due to trouble relaxing and allowing scapulo humeral dissociation.  Focused on postural changes and instruction to start focusing on as well as STM to the Rt upper quadrant with pain in UT, lat, levator, and rhomboids.  Improved with MT but limited MT to see if pt will be sore    PT Frequency 2x / week    PT Duration 6 weeks    PT Treatment/Interventions ADLs/Self Care Home Management;Therapeutic exercise;Patient/family education;Scar mobilization;Passive range of motion;Manual lymph drainage;Manual techniques;Orthotic Fit/Training    PT Next Visit Plan Sign up ABC class, SOZO every 3 months schedule around 9/23,  AAROM and PROM right shoulder, STM right upper quarter, posture work and pulleys    Consulted and Agree with Plan of Care Patient             Patient will benefit from skilled therapeutic intervention in order to improve the following deficits and impairments:     Visit Diagnosis: Abnormal posture  Malignant neoplasm of upper-outer quadrant of right breast in female, estrogen receptor positive ([place])  Acute pain of right  shoulder     Problem  List Patient Active Problem List   Diagnosis Date Noted   Diarrhea 10/24/2020   Port-A-Cath in place 09/26/2020   Hypokalemia 09/26/2020   Malignant neoplasm of upper-outer quadrant of right breast in female, estrogen receptor positive ([place]) 08/15/2020   Personal history of COVID-19 07/04/2020   Mixed hyperlipidemia 07/04/2020   Diabetes mellitus type 2 in obese ([place]) 04/28/2019   Precordial pain 04/28/2019   DOE (dyspnea on exertion) 04/28/2019   Tendonitis, Achilles, right 01/17/2018   Heel pain 09/26/2015   Plantar fasciitis of right foot 10/25/2014   Equinus deformity of foot, acquired 10/25/2014   Tarsal tunnel syndrome 10/25/2014   Deformity of metatarsal bone of right foot 10/25/2014   Genital herpes, unspecified 12/21/2012   DIABETES MELLITUS II, UNCOMPLICATED [id number]   OBESITY, NOS 08/25/2006   ANEMIA, ACUTE BLOOD LOSS 08/25/2006   DEPRESSION, MAJOR, RECURRENT 08/25/2006   HEADACHE, UNSPECIFIED 08/25/2006    Stark Bray 02/04/2021, 1:52 PM  [place], [place], [address] Phone: [phone]   Fax:  [phone]  Name: Rachel Gaines MRN: [id number] Date of Birth: 26-Nov-1955

## 2021-02-05 ENCOUNTER — Ambulatory Visit
Admission: RE | Admit: 2021-02-05 | Discharge: 2021-02-05 | Disposition: A | Discharge status: Home / Self Care | Payer: 59 | Source: Ambulatory Visit | Attending: Radiation Oncology | Admitting: Radiation Oncology

## 2021-02-05 DIAGNOSIS — C50411 Malignant neoplasm of upper-outer quadrant of right female breast: Secondary | ICD-10-CM | POA: Diagnosis not present

## 2021-02-05 DIAGNOSIS — E876 Hypokalemia: Secondary | ICD-10-CM | POA: Diagnosis not present

## 2021-02-05 DIAGNOSIS — Z510 Encounter for antineoplastic radiation therapy: Secondary | ICD-10-CM | POA: Diagnosis not present

## 2021-02-05 DIAGNOSIS — T451X5D Adverse effect of antineoplastic and immunosuppressive drugs, subsequent encounter: Secondary | ICD-10-CM | POA: Diagnosis not present

## 2021-02-05 DIAGNOSIS — Z170 Estrogen receptor positive status [ER+]: Secondary | ICD-10-CM | POA: Diagnosis not present

## 2021-02-05 DIAGNOSIS — Z5112 Encounter for antineoplastic immunotherapy: Secondary | ICD-10-CM | POA: Diagnosis not present

## 2021-02-05 DIAGNOSIS — R634 Abnormal weight loss: Secondary | ICD-10-CM | POA: Diagnosis not present

## 2021-02-05 DIAGNOSIS — G620 Drug-induced polyneuropathy: Secondary | ICD-10-CM | POA: Diagnosis not present

## 2021-02-05 DIAGNOSIS — R197 Diarrhea, unspecified: Secondary | ICD-10-CM | POA: Diagnosis not present

## 2021-02-06 ENCOUNTER — Ambulatory Visit
Admission: RE | Admit: 2021-02-06 | Discharge: 2021-02-06 | Disposition: A | Discharge status: Home / Self Care | Payer: 59 | Source: Ambulatory Visit | Attending: Radiation Oncology | Admitting: Radiation Oncology

## 2021-02-06 ENCOUNTER — Other Ambulatory Visit: Payer: Self-pay

## 2021-02-06 ENCOUNTER — Ambulatory Visit: Payer: 59 | Admitting: Radiation Oncology

## 2021-02-06 DIAGNOSIS — R197 Diarrhea, unspecified: Secondary | ICD-10-CM | POA: Diagnosis not present

## 2021-02-06 DIAGNOSIS — T451X5D Adverse effect of antineoplastic and immunosuppressive drugs, subsequent encounter: Secondary | ICD-10-CM | POA: Diagnosis not present

## 2021-02-06 DIAGNOSIS — Z510 Encounter for antineoplastic radiation therapy: Secondary | ICD-10-CM | POA: Diagnosis not present

## 2021-02-06 DIAGNOSIS — Z5112 Encounter for antineoplastic immunotherapy: Secondary | ICD-10-CM | POA: Diagnosis not present

## 2021-02-06 DIAGNOSIS — Z170 Estrogen receptor positive status [ER+]: Secondary | ICD-10-CM | POA: Diagnosis not present

## 2021-02-06 DIAGNOSIS — E876 Hypokalemia: Secondary | ICD-10-CM | POA: Diagnosis not present

## 2021-02-06 DIAGNOSIS — R634 Abnormal weight loss: Secondary | ICD-10-CM | POA: Diagnosis not present

## 2021-02-06 DIAGNOSIS — G620 Drug-induced polyneuropathy: Secondary | ICD-10-CM | POA: Diagnosis not present

## 2021-02-06 DIAGNOSIS — C50411 Malignant neoplasm of upper-outer quadrant of right female breast: Secondary | ICD-10-CM | POA: Diagnosis not present

## 2021-02-09 ENCOUNTER — Ambulatory Visit
Admission: RE | Admit: 2021-02-09 | Discharge: 2021-02-09 | Disposition: A | Discharge status: Home / Self Care | Payer: 59 | Source: Ambulatory Visit | Attending: Radiation Oncology | Admitting: Radiation Oncology

## 2021-02-09 ENCOUNTER — Encounter: Payer: 59 | Admitting: Physical Therapy

## 2021-02-09 ENCOUNTER — Other Ambulatory Visit (HOSPITAL_COMMUNITY): Payer: Self-pay

## 2021-02-09 DIAGNOSIS — Z170 Estrogen receptor positive status [ER+]: Secondary | ICD-10-CM | POA: Diagnosis not present

## 2021-02-09 DIAGNOSIS — G620 Drug-induced polyneuropathy: Secondary | ICD-10-CM | POA: Diagnosis not present

## 2021-02-09 DIAGNOSIS — Z510 Encounter for antineoplastic radiation therapy: Secondary | ICD-10-CM | POA: Diagnosis not present

## 2021-02-09 DIAGNOSIS — C50411 Malignant neoplasm of upper-outer quadrant of right female breast: Secondary | ICD-10-CM | POA: Diagnosis not present

## 2021-02-09 DIAGNOSIS — E876 Hypokalemia: Secondary | ICD-10-CM | POA: Diagnosis not present

## 2021-02-09 DIAGNOSIS — R197 Diarrhea, unspecified: Secondary | ICD-10-CM | POA: Diagnosis not present

## 2021-02-09 DIAGNOSIS — Z5112 Encounter for antineoplastic immunotherapy: Secondary | ICD-10-CM | POA: Diagnosis not present

## 2021-02-09 DIAGNOSIS — T451X5D Adverse effect of antineoplastic and immunosuppressive drugs, subsequent encounter: Secondary | ICD-10-CM | POA: Diagnosis not present

## 2021-02-09 DIAGNOSIS — R634 Abnormal weight loss: Secondary | ICD-10-CM | POA: Diagnosis not present

## 2021-02-09 MED FILL — Sitagliptin Phosphate Tab 100 MG (Base Equiv): ORAL | 30 days supply | Qty: 30 | Fill #3 | Status: AC

## 2021-02-10 ENCOUNTER — Other Ambulatory Visit (HOSPITAL_COMMUNITY): Payer: Self-pay

## 2021-02-10 ENCOUNTER — Other Ambulatory Visit: Payer: Self-pay

## 2021-02-10 ENCOUNTER — Ambulatory Visit: Payer: 59 | Admitting: Radiation Oncology

## 2021-02-10 ENCOUNTER — Ambulatory Visit
Admission: RE | Admit: 2021-02-10 | Discharge: 2021-02-10 | Disposition: A | Discharge status: Home / Self Care | Payer: 59 | Source: Ambulatory Visit | Attending: Radiation Oncology | Admitting: Radiation Oncology

## 2021-02-10 DIAGNOSIS — R197 Diarrhea, unspecified: Secondary | ICD-10-CM | POA: Diagnosis not present

## 2021-02-10 DIAGNOSIS — C50411 Malignant neoplasm of upper-outer quadrant of right female breast: Secondary | ICD-10-CM | POA: Diagnosis not present

## 2021-02-10 DIAGNOSIS — Z170 Estrogen receptor positive status [ER+]: Secondary | ICD-10-CM | POA: Diagnosis not present

## 2021-02-10 DIAGNOSIS — Z510 Encounter for antineoplastic radiation therapy: Secondary | ICD-10-CM | POA: Diagnosis not present

## 2021-02-10 DIAGNOSIS — G620 Drug-induced polyneuropathy: Secondary | ICD-10-CM | POA: Diagnosis not present

## 2021-02-10 DIAGNOSIS — E876 Hypokalemia: Secondary | ICD-10-CM | POA: Diagnosis not present

## 2021-02-10 DIAGNOSIS — R634 Abnormal weight loss: Secondary | ICD-10-CM | POA: Diagnosis not present

## 2021-02-10 DIAGNOSIS — T451X5D Adverse effect of antineoplastic and immunosuppressive drugs, subsequent encounter: Secondary | ICD-10-CM | POA: Diagnosis not present

## 2021-02-10 DIAGNOSIS — Z5112 Encounter for antineoplastic immunotherapy: Secondary | ICD-10-CM | POA: Diagnosis not present

## 2021-02-10 MED ORDER — INSULIN GLARGINE-YFGN 100 UNIT/ML ~~LOC~~ SOPN
34.0000 [IU] | PEN_INJECTOR | Freq: Two times a day (BID) | SUBCUTANEOUS | 3 refills | Status: DC
  Filled 2021-02-10: qty 60, 88d supply, fill #0

## 2021-02-11 ENCOUNTER — Ambulatory Visit: Payer: 59

## 2021-02-11 ENCOUNTER — Ambulatory Visit
Admission: RE | Admit: 2021-02-11 | Discharge: 2021-02-11 | Disposition: A | Discharge status: Home / Self Care | Payer: 59 | Source: Ambulatory Visit | Attending: Radiation Oncology | Admitting: Radiation Oncology

## 2021-02-11 DIAGNOSIS — C50411 Malignant neoplasm of upper-outer quadrant of right female breast: Secondary | ICD-10-CM

## 2021-02-11 DIAGNOSIS — R634 Abnormal weight loss: Secondary | ICD-10-CM | POA: Diagnosis not present

## 2021-02-11 DIAGNOSIS — R197 Diarrhea, unspecified: Secondary | ICD-10-CM | POA: Diagnosis not present

## 2021-02-11 DIAGNOSIS — G620 Drug-induced polyneuropathy: Secondary | ICD-10-CM | POA: Diagnosis not present

## 2021-02-11 DIAGNOSIS — Z510 Encounter for antineoplastic radiation therapy: Secondary | ICD-10-CM | POA: Diagnosis not present

## 2021-02-11 DIAGNOSIS — Z170 Estrogen receptor positive status [ER+]: Secondary | ICD-10-CM | POA: Diagnosis not present

## 2021-02-11 DIAGNOSIS — T451X5D Adverse effect of antineoplastic and immunosuppressive drugs, subsequent encounter: Secondary | ICD-10-CM | POA: Diagnosis not present

## 2021-02-11 DIAGNOSIS — Z5112 Encounter for antineoplastic immunotherapy: Secondary | ICD-10-CM | POA: Diagnosis not present

## 2021-02-11 DIAGNOSIS — R293 Abnormal posture: Secondary | ICD-10-CM

## 2021-02-11 DIAGNOSIS — M25511 Pain in right shoulder: Secondary | ICD-10-CM

## 2021-02-11 DIAGNOSIS — E876 Hypokalemia: Secondary | ICD-10-CM | POA: Diagnosis not present

## 2021-02-11 NOTE — Patient Instructions (Signed)
SHOULDER: Flexion - Supine (Cane)        Cancer Rehab [phone]    Hold cane in both hands. Raise arms up overhead. Do not allow back to arch. Hold _5__ seconds. Do __5-10__ times; __1-2__ times a day. Hands shoulder width apart 2. Hands slightly wider (V) position Shoulder Blade Stretch    Clasp fingers behind head with elbows touching in front of face. Pull elbows back while pressing shoulder blades together. Relax and hold as tolerated, can place pillow under elbow here for comfort as needed and to allow for prolonged stretch.  Repeat __5__ times. Do __1-2__ sessions per day.

## 2021-02-11 NOTE — Therapy (Signed)
[place], [place], [address] Phone: [phone]   Fax:  [phone]  Physical Therapy Treatment  Patient Details  Name: Rachel Gaines MRN: [id number] Date of Birth: 1956-04-16 Referring Provider (PT): Dr. Donne Hazel   Encounter Date: 02/11/2021   PT End of Session - 02/11/21 1657     Visit Number 4    Number of Visits 14    Date for PT Re-Evaluation 03/09/21    PT Start Time [id number]    PT Stop Time [id number]    PT Time Calculation (min) 51 min    Activity Tolerance Patient tolerated treatment well    Behavior During Therapy [facility] for tasks assessed/performed             Past Medical History:  Diagnosis Date   Abnormal Pap smear of cervix 2014   ASCUS with negative HR HPV    Anemia    Breast cancer ([place]) 11/2020   Cancer ([place]) 06/2020   right breast IDC with DCIS   Cataract    Diabetes mellitus without complication ([place])    Hyperlipidemia    on medicine   Plantar fasciitis    STD (sexually transmitted disease)    HSV type II    Past Surgical History:  Procedure Laterality Date   BREAST LUMPECTOMY WITH RADIOACTIVE SEED AND SENTINEL LYMPH NODE BIOPSY Right 12/18/2020   Procedure: RIGHT BREAST LUMPECTOMY WITH RADIOACTIVE SEED AND RIGHT AXILLARY SENTINEL LYMPH NODE BIOPSY;  Surgeon: Rolm Bookbinder, MD;  Location: [facility];  Service: General;  Laterality: Right;   CATARACT EXTRACTION  2019   COLONOSCOPY     COLPOSCOPY W/ BIOPSY / CURETTAGE  02/04/1999   Chronic cervicitis   EYE SURGERY     PORTACATH PLACEMENT N/A 08/26/2020   Procedure: INSERTION PORT-A-CATH;  Surgeon: Rolm Bookbinder, MD;  Location: [facility];  Service: General;  Laterality: N/A;  START TIME OF 12:30 PM FOR 60 MINUTES ROOM 8    There were no vitals filed for this visit.   Subjective Assessment - 02/11/21 1606     Subjective Radiation is going OK.  Getting a little skin darkening and soreness but using the  cream 2 times per day. Having alot of pain in UT, inner arm and axillary region.  Have been using voltaren.  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  Pt had right lumpectomy with SLNB on 12/18/2020    Patient Stated Goals Reassessment after surgery    Currently in Pain? Yes    Pain Score 6     Pain Location Shoulder    Pain Orientation Right    Pain Descriptors / Indicators Tightness;Sore    Pain Type Surgical pain    Pain Onset More than a month ago    Pain Frequency Intermittent                               [facility] Adult PT Treatment/Exercise - 02/11/21 0001       Shoulder Exercises: Supine   Other Supine Exercises Wand flexion and scaption x 5, stargazer      Manual Therapy   Edema Management Small TG soft cut and applied to arm to decrease discomfort from cording    Soft tissue mobilization Right pectoralis and lats to decrease tenderness    Myofascial Release to  multiple areas of right upper arm, elbow and forearm areas of cording    Passive ROM as tolerated into flexion, ER, scaption  and abduction but hard to relax                    PT Education - 02/11/21 1654     Education Details Pt educated in supine wand flex and scaption, and reviewed stargazer stretch    Person(s) Educated Patient    Methods Explanation;Demonstration;Handout    Comprehension Returned demonstration                 PT Long Term Goals - 01/26/21 0959       PT LONG TERM GOAL #1   Title Pt will return to baseline shoudler ROM measurements and not demonstrate any signs of lymphedema.    Time 6    Period Weeks    Status New    Target Date 03/09/21      PT LONG TERM GOAL #2   Title Pt will report decreased pain by 50% or greater    Time 6    Period Weeks    Status New    Target Date 03/09/21      PT LONG TERM GOAL #3    Title Pt will have right shoulder flexion and abd atleast 125 degrees for improved home activies    Time 3    Period Weeks    Status New    Target Date 02/16/21      PT LONG TERM GOAL #4   Title Quick dash will be no greater than 18%    Time 6    Period Weeks    Status New    Target Date 03/09/21      PT LONG TERM GOAL #5   Title Pt will attend ABC class for education in exercises and lymphedema    Time 6    Period Weeks    Status New    Target Date 03/09/21                   Plan - 02/11/21 1657     Clinical Impression Statement Pt with multiple cords noted throughout right UE.  Had 4 pops during MFR techniques mostly near the elbow.  Small TG soft cut for the right UE to decrease soreness from cords.  Mild breast swelling with firmness noted at right medial breast and areolar incision but not addressed today.  May benefit from foam pad/MLD. Soft tissue mobilization and PROM to right shoulder continued with tenderness noted especially in pecs.  Pt still has a tendency to compensate with traps and needed to reposition shoulder for PROM several times    Personal Factors and Comorbidities Comorbidity 1    Comorbidities diabetes    Examination-Activity Limitations Lift;Reach Overhead;Sleep    Stability/Clinical Decision Making Stable/Uncomplicated    Rehab Potential Excellent    PT Frequency 2x / week    PT Duration 6 weeks    PT Treatment/Interventions ADLs/Self Care Home Management;Therapeutic exercise;Patient/family education;Scar mobilization;Passive range of motion;Manual lymph drainage;Manual techniques;Orthotic Fit/Training    PT Next Visit Plan Sign up ABC class, SOZO every 3 months schedule around 9/23, MFR techniques to cording, assess TG soft AAROM and PROM right shoulder, STM right upper quarter, posture work and pulleys, consider right breast MLD/foam pad    Consulted and Agree with Plan of Care Patient             Patient will benefit from  skilled  therapeutic intervention in order to improve the following deficits and impairments:  Postural dysfunction, Decreased knowledge of precautions, Pain, Decreased scar mobility, Decreased range of motion, Decreased strength, Impaired UE functional use  Visit Diagnosis: Abnormal posture  Malignant neoplasm of upper-outer quadrant of right breast in female, estrogen receptor positive (HCC)  Acute pain of right shoulder     Problem List Patient Active Problem List   Diagnosis Date Noted   Diarrhea 10/24/2020   Port-A-Cath in place 09/26/2020   Hypokalemia 09/26/2020   Malignant neoplasm of upper-outer quadrant of right breast in female, estrogen receptor positive ([place]) 08/15/2020   Personal history of COVID-19 07/04/2020   Mixed hyperlipidemia 07/04/2020   Diabetes mellitus type 2 in obese ([place]) 04/28/2019   Precordial pain 04/28/2019   DOE (dyspnea on exertion) 04/28/2019   Tendonitis, Achilles, right 01/17/2018   Heel pain 09/26/2015   Plantar fasciitis of right foot 10/25/2014   Equinus deformity of foot, acquired 10/25/2014   Tarsal tunnel syndrome 10/25/2014   Deformity of metatarsal bone of right foot 10/25/2014   Genital herpes, unspecified 12/21/2012   DIABETES MELLITUS II, UNCOMPLICATED [id number]   OBESITY, NOS 08/25/2006   ANEMIA, ACUTE BLOOD LOSS 08/25/2006   DEPRESSION, MAJOR, RECURRENT 08/25/2006   HEADACHE, UNSPECIFIED 08/25/2006    Elsie Ra Toryn Mcclinton 02/11/2021, 5:03 PM  [place], [place], [address] Phone: [phone]   Fax:  [phone]  Name: Shirleyan Kaczynski MRN: [id number] Date of Birth: August 16, 1955  Cheral Almas, PT 02/11/21 5:04 PM

## 2021-02-12 ENCOUNTER — Encounter: Payer: Self-pay | Admitting: *Deleted

## 2021-02-12 ENCOUNTER — Other Ambulatory Visit: Payer: Self-pay

## 2021-02-12 ENCOUNTER — Ambulatory Visit: Payer: 59

## 2021-02-12 ENCOUNTER — Other Ambulatory Visit (HOSPITAL_COMMUNITY): Payer: Self-pay

## 2021-02-12 ENCOUNTER — Ambulatory Visit
Admission: RE | Admit: 2021-02-12 | Discharge: 2021-02-12 | Disposition: A | Discharge status: Home / Self Care | Payer: 59 | Source: Ambulatory Visit | Attending: Radiation Oncology | Admitting: Radiation Oncology

## 2021-02-12 DIAGNOSIS — Z5112 Encounter for antineoplastic immunotherapy: Secondary | ICD-10-CM | POA: Diagnosis not present

## 2021-02-12 DIAGNOSIS — C50411 Malignant neoplasm of upper-outer quadrant of right female breast: Secondary | ICD-10-CM | POA: Diagnosis not present

## 2021-02-12 DIAGNOSIS — G620 Drug-induced polyneuropathy: Secondary | ICD-10-CM | POA: Diagnosis not present

## 2021-02-12 DIAGNOSIS — Z170 Estrogen receptor positive status [ER+]: Secondary | ICD-10-CM | POA: Diagnosis not present

## 2021-02-12 DIAGNOSIS — Z510 Encounter for antineoplastic radiation therapy: Secondary | ICD-10-CM | POA: Diagnosis not present

## 2021-02-12 DIAGNOSIS — R197 Diarrhea, unspecified: Secondary | ICD-10-CM | POA: Diagnosis not present

## 2021-02-12 DIAGNOSIS — E876 Hypokalemia: Secondary | ICD-10-CM | POA: Diagnosis not present

## 2021-02-12 DIAGNOSIS — T451X5D Adverse effect of antineoplastic and immunosuppressive drugs, subsequent encounter: Secondary | ICD-10-CM | POA: Diagnosis not present

## 2021-02-12 DIAGNOSIS — R634 Abnormal weight loss: Secondary | ICD-10-CM | POA: Diagnosis not present

## 2021-02-13 ENCOUNTER — Other Ambulatory Visit: Payer: Self-pay | Admitting: Hematology and Oncology

## 2021-02-13 ENCOUNTER — Other Ambulatory Visit (HOSPITAL_COMMUNITY): Payer: Self-pay

## 2021-02-13 ENCOUNTER — Ambulatory Visit
Admission: RE | Admit: 2021-02-13 | Discharge: 2021-02-13 | Disposition: A | Discharge status: Home / Self Care | Payer: 59 | Source: Ambulatory Visit | Attending: Radiation Oncology | Admitting: Radiation Oncology

## 2021-02-13 ENCOUNTER — Ambulatory Visit: Payer: 59

## 2021-02-13 ENCOUNTER — Inpatient Hospital Stay: Payer: 59 | Attending: Hematology and Oncology

## 2021-02-13 VITALS — BP 135/59 | HR 69 | Temp 98.1°F | Resp 18

## 2021-02-13 DIAGNOSIS — C50411 Malignant neoplasm of upper-outer quadrant of right female breast: Secondary | ICD-10-CM | POA: Diagnosis not present

## 2021-02-13 DIAGNOSIS — R634 Abnormal weight loss: Secondary | ICD-10-CM | POA: Diagnosis not present

## 2021-02-13 DIAGNOSIS — Z510 Encounter for antineoplastic radiation therapy: Secondary | ICD-10-CM | POA: Insufficient documentation

## 2021-02-13 DIAGNOSIS — T451X5D Adverse effect of antineoplastic and immunosuppressive drugs, subsequent encounter: Secondary | ICD-10-CM | POA: Diagnosis not present

## 2021-02-13 DIAGNOSIS — Z170 Estrogen receptor positive status [ER+]: Secondary | ICD-10-CM | POA: Diagnosis not present

## 2021-02-13 DIAGNOSIS — R197 Diarrhea, unspecified: Secondary | ICD-10-CM

## 2021-02-13 DIAGNOSIS — Z801 Family history of malignant neoplasm of trachea, bronchus and lung: Secondary | ICD-10-CM | POA: Insufficient documentation

## 2021-02-13 DIAGNOSIS — E876 Hypokalemia: Secondary | ICD-10-CM | POA: Insufficient documentation

## 2021-02-13 DIAGNOSIS — Z5112 Encounter for antineoplastic immunotherapy: Secondary | ICD-10-CM | POA: Diagnosis not present

## 2021-02-13 DIAGNOSIS — R5383 Other fatigue: Secondary | ICD-10-CM | POA: Insufficient documentation

## 2021-02-13 DIAGNOSIS — R293 Abnormal posture: Secondary | ICD-10-CM | POA: Insufficient documentation

## 2021-02-13 DIAGNOSIS — Z880 Allergy status to penicillin: Secondary | ICD-10-CM | POA: Insufficient documentation

## 2021-02-13 DIAGNOSIS — M25511 Pain in right shoulder: Secondary | ICD-10-CM | POA: Insufficient documentation

## 2021-02-13 DIAGNOSIS — G620 Drug-induced polyneuropathy: Secondary | ICD-10-CM | POA: Diagnosis not present

## 2021-02-13 MED ORDER — SODIUM CHLORIDE 0.9% FLUSH
10.0000 mL | INTRAVENOUS | Status: DC | PRN
  Administered 2021-02-13: 10 mL

## 2021-02-13 MED ORDER — DIPHENHYDRAMINE HCL 25 MG PO CAPS
50.0000 mg | ORAL_CAPSULE | Freq: Once | ORAL | Status: AC
  Administered 2021-02-13: 50 mg via ORAL
  Filled 2021-02-13: qty 2

## 2021-02-13 MED ORDER — HEPARIN SOD (PORK) LOCK FLUSH 100 UNIT/ML IV SOLN
500.0000 [IU] | Freq: Once | INTRAVENOUS | Status: AC | PRN
  Administered 2021-02-13: 500 [IU]

## 2021-02-13 MED ORDER — ACETAMINOPHEN 325 MG PO TABS
650.0000 mg | ORAL_TABLET | Freq: Once | ORAL | Status: AC
  Administered 2021-02-13: 650 mg via ORAL
  Filled 2021-02-13: qty 2

## 2021-02-13 MED ORDER — SODIUM CHLORIDE 0.9 % IV SOLN: Freq: Once | INTRAVENOUS | Status: AC

## 2021-02-13 MED ORDER — DIPHENOXYLATE-ATROPINE 2.5-0.025 MG PO TABS
1.0000 | ORAL_TABLET | Freq: Four times a day (QID) | ORAL | 0 refills | Status: DC | PRN
  Filled 2021-02-13: qty 30, 8d supply, fill #0

## 2021-02-13 MED ORDER — TRASTUZUMAB-ANNS CHEMO 150 MG IV SOLR
450.0000 mg | Freq: Once | INTRAVENOUS | Status: AC
  Administered 2021-02-13: 450 mg via INTRAVENOUS
  Filled 2021-02-13: qty 21.43

## 2021-02-13 NOTE — Patient Instructions (Signed)
[facility]]   Discharge Instructions: Thank you for choosing [facility] to provide your oncology and hematology care.   If you have a lab appointment with the Cancer Center, please go directly to the Cancer Center and check in at the registration area.   Wear comfortable clothing and clothing appropriate for easy access to any Portacath or PICC line.   We strive to give you quality time with your provider. You may need to reschedule your appointment if you arrive late (15 or more minutes).  Arriving late affects you and other patients whose appointments are after yours.  Also, if you miss three or more appointments without notifying the office, you may be dismissed from the clinic at the provider's discretion.      For prescription refill requests, have your pharmacy contact our office and allow 72 hours for refills to be completed.    Today you received the following chemotherapy and/or immunotherapy agents: Trastuzumab (Herceptin)       To help prevent nausea and vomiting after your treatment, we encourage you to take your nausea medication as directed.  BELOW ARE SYMPTOMS THAT SHOULD BE REPORTED IMMEDIATELY: *FEVER GREATER THAN 100.4 F (38 C) OR HIGHER *CHILLS OR SWEATING *NAUSEA AND VOMITING THAT IS NOT CONTROLLED WITH YOUR NAUSEA MEDICATION *UNUSUAL SHORTNESS OF BREATH *UNUSUAL BRUISING OR BLEEDING *URINARY PROBLEMS (pain or burning when urinating, or frequent urination) *BOWEL PROBLEMS (unusual diarrhea, constipation, pain near the anus) TENDERNESS IN MOUTH AND THROAT WITH OR WITHOUT PRESENCE OF ULCERS (sore throat, sores in mouth, or a toothache) UNUSUAL RASH, SWELLING OR PAIN  UNUSUAL VAGINAL DISCHARGE OR ITCHING   Items with * indicate a potential emergency and should be followed up as soon as possible or go to the Emergency Department if any problems should occur.  Please show the CHEMOTHERAPY ALERT CARD or IMMUNOTHERAPY ALERT  CARD at check-in to the Emergency Department and triage nurse.  Should you have questions after your visit or need to cancel or reschedule your appointment, please contact [facility]  Dept: [phone]  and follow the prompts.  Office hours are 8:00 a.m. to 4:30 p.m. Monday - Friday. Please note that voicemails left after 4:00 p.m. may not be returned until the following business day.  We are closed weekends and major holidays. You have access to a nurse at all times for urgent questions. Please call the main number to the clinic Dept: [phone] and follow the prompts.   For any non-urgent questions, you may also contact your provider using MyChart. We now offer e-Visits for anyone 18 and older to request care online for non-urgent symptoms. For details visit [email or web address].   Also download the MyChart app! Go to the app store, search "MyChart", open the app, select [facility], and log in with your MyChart username and password.  Due to Covid, a mask is required upon entering the hospital/clinic. If you do not have a mask, one will be given to you upon arrival. For doctor visits, patients may have 1 support person aged 18 or older with them. For treatment visits, patients cannot have anyone with them due to current Covid guidelines and our immunocompromised population.  

## 2021-02-16 ENCOUNTER — Ambulatory Visit
Admission: RE | Admit: 2021-02-16 | Discharge: 2021-02-16 | Disposition: A | Discharge status: Home / Self Care | Payer: 59 | Source: Ambulatory Visit | Attending: Radiation Oncology | Admitting: Radiation Oncology

## 2021-02-16 ENCOUNTER — Other Ambulatory Visit: Payer: Self-pay

## 2021-02-16 DIAGNOSIS — Z5112 Encounter for antineoplastic immunotherapy: Secondary | ICD-10-CM | POA: Diagnosis not present

## 2021-02-16 DIAGNOSIS — Z510 Encounter for antineoplastic radiation therapy: Secondary | ICD-10-CM | POA: Diagnosis not present

## 2021-02-16 DIAGNOSIS — G620 Drug-induced polyneuropathy: Secondary | ICD-10-CM | POA: Diagnosis not present

## 2021-02-16 DIAGNOSIS — C50411 Malignant neoplasm of upper-outer quadrant of right female breast: Secondary | ICD-10-CM | POA: Diagnosis not present

## 2021-02-16 DIAGNOSIS — R197 Diarrhea, unspecified: Secondary | ICD-10-CM | POA: Diagnosis not present

## 2021-02-16 DIAGNOSIS — Z170 Estrogen receptor positive status [ER+]: Secondary | ICD-10-CM | POA: Diagnosis not present

## 2021-02-16 DIAGNOSIS — R634 Abnormal weight loss: Secondary | ICD-10-CM | POA: Diagnosis not present

## 2021-02-16 DIAGNOSIS — T451X5D Adverse effect of antineoplastic and immunosuppressive drugs, subsequent encounter: Secondary | ICD-10-CM | POA: Diagnosis not present

## 2021-02-16 DIAGNOSIS — E876 Hypokalemia: Secondary | ICD-10-CM | POA: Diagnosis not present

## 2021-02-17 ENCOUNTER — Ambulatory Visit
Admission: RE | Admit: 2021-02-17 | Discharge: 2021-02-17 | Disposition: A | Discharge status: Home / Self Care | Payer: 59 | Source: Ambulatory Visit | Attending: Radiation Oncology | Admitting: Radiation Oncology

## 2021-02-17 ENCOUNTER — Encounter: Payer: Self-pay | Admitting: Licensed Clinical Social Worker

## 2021-02-17 DIAGNOSIS — G620 Drug-induced polyneuropathy: Secondary | ICD-10-CM | POA: Diagnosis not present

## 2021-02-17 DIAGNOSIS — T451X5D Adverse effect of antineoplastic and immunosuppressive drugs, subsequent encounter: Secondary | ICD-10-CM | POA: Diagnosis not present

## 2021-02-17 DIAGNOSIS — C50411 Malignant neoplasm of upper-outer quadrant of right female breast: Secondary | ICD-10-CM | POA: Diagnosis not present

## 2021-02-17 DIAGNOSIS — Z5112 Encounter for antineoplastic immunotherapy: Secondary | ICD-10-CM | POA: Diagnosis not present

## 2021-02-17 DIAGNOSIS — Z170 Estrogen receptor positive status [ER+]: Secondary | ICD-10-CM | POA: Diagnosis not present

## 2021-02-17 DIAGNOSIS — R197 Diarrhea, unspecified: Secondary | ICD-10-CM | POA: Diagnosis not present

## 2021-02-17 DIAGNOSIS — R634 Abnormal weight loss: Secondary | ICD-10-CM | POA: Diagnosis not present

## 2021-02-17 DIAGNOSIS — E876 Hypokalemia: Secondary | ICD-10-CM | POA: Diagnosis not present

## 2021-02-17 DIAGNOSIS — Z510 Encounter for antineoplastic radiation therapy: Secondary | ICD-10-CM | POA: Diagnosis not present

## 2021-02-17 NOTE — Progress Notes (Signed)
[facility] CSW Progress Note  Holiday representative met with patient to assist with submitting [employer] fund application. CSW helped scan it to the patient who then sent it in with mortgage information.     Christeen Douglas , LCSW

## 2021-02-18 ENCOUNTER — Ambulatory Visit: Payer: 59

## 2021-02-18 ENCOUNTER — Encounter: Payer: Self-pay | Admitting: *Deleted

## 2021-02-18 ENCOUNTER — Ambulatory Visit
Admission: RE | Admit: 2021-02-18 | Discharge: 2021-02-18 | Disposition: A | Discharge status: Home / Self Care | Payer: 59 | Source: Ambulatory Visit | Attending: Radiation Oncology | Admitting: Radiation Oncology

## 2021-02-18 ENCOUNTER — Other Ambulatory Visit: Payer: Self-pay

## 2021-02-18 DIAGNOSIS — R293 Abnormal posture: Secondary | ICD-10-CM

## 2021-02-18 DIAGNOSIS — Z170 Estrogen receptor positive status [ER+]: Secondary | ICD-10-CM | POA: Diagnosis not present

## 2021-02-18 DIAGNOSIS — G620 Drug-induced polyneuropathy: Secondary | ICD-10-CM | POA: Diagnosis not present

## 2021-02-18 DIAGNOSIS — C50411 Malignant neoplasm of upper-outer quadrant of right female breast: Secondary | ICD-10-CM

## 2021-02-18 DIAGNOSIS — M25511 Pain in right shoulder: Secondary | ICD-10-CM

## 2021-02-18 DIAGNOSIS — E876 Hypokalemia: Secondary | ICD-10-CM | POA: Diagnosis not present

## 2021-02-18 DIAGNOSIS — R197 Diarrhea, unspecified: Secondary | ICD-10-CM | POA: Diagnosis not present

## 2021-02-18 DIAGNOSIS — Z510 Encounter for antineoplastic radiation therapy: Secondary | ICD-10-CM | POA: Diagnosis not present

## 2021-02-18 DIAGNOSIS — R634 Abnormal weight loss: Secondary | ICD-10-CM | POA: Diagnosis not present

## 2021-02-18 DIAGNOSIS — T451X5D Adverse effect of antineoplastic and immunosuppressive drugs, subsequent encounter: Secondary | ICD-10-CM | POA: Diagnosis not present

## 2021-02-18 DIAGNOSIS — Z5112 Encounter for antineoplastic immunotherapy: Secondary | ICD-10-CM | POA: Diagnosis not present

## 2021-02-18 NOTE — Therapy (Signed)
[place], [place], [address] Phone: [phone]   Fax:  [phone]  Physical Therapy Treatment  Patient Details  Name: Rachel Gaines MRN: [id number] Date of Birth: Dec 14, 1955 Referring Provider (PT): Dr. Donne Hazel   Encounter Date: 02/18/2021   PT End of Session - 02/18/21 1653     Visit Number 5    Number of Visits 14    Date for PT Re-Evaluation 03/09/21    PT Start Time [id number]    PT Stop Time 1650    PT Time Calculation (min) 46 min    Activity Tolerance Patient tolerated treatment well;Treatment limited secondary to medical complications (Comment);Other (comment)   Pt was having stomach problems and had to stop treatment twice.  Left slightly early secondary to not feeling well   Behavior During Therapy [facility] for tasks assessed/performed             Past Medical History:  Diagnosis Date   Abnormal Pap smear of cervix 2014   ASCUS with negative HR HPV    Anemia    Breast cancer ([place]) 11/2020   Cancer ([place]) 06/2020   right breast IDC with DCIS   Cataract    Diabetes mellitus without complication ([place])    Hyperlipidemia    on medicine   Plantar fasciitis    STD (sexually transmitted disease)    HSV type II    Past Surgical History:  Procedure Laterality Date   BREAST LUMPECTOMY WITH RADIOACTIVE SEED AND SENTINEL LYMPH NODE BIOPSY Right 12/18/2020   Procedure: RIGHT BREAST LUMPECTOMY WITH RADIOACTIVE SEED AND RIGHT AXILLARY SENTINEL LYMPH NODE BIOPSY;  Surgeon: Rolm Bookbinder, MD;  Location: [facility];  Service: General;  Laterality: Right;   CATARACT EXTRACTION  2019   COLONOSCOPY     COLPOSCOPY W/ BIOPSY / CURETTAGE  02/04/1999   Chronic cervicitis   EYE SURGERY     PORTACATH PLACEMENT N/A 08/26/2020   Procedure: INSERTION PORT-A-CATH;  Surgeon: Rolm Bookbinder, MD;  Location: [facility];  Service: General;  Laterality: N/A;  START TIME OF 12:30 PM FOR 60 MINUTES ROOM  8    There were no vitals filed for this visit.   Subjective Assessment - 02/18/21 1601     Subjective I have been doing the exercises.  Its at night when I feel the most pain in the axillary region.  Cording is a little better.  I was a little sore after last visit. They are doing the boost now with radiation and I have about 3 sessions left. Right breast feels swollen and lateral trunk.    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  Pt had right lumpectomy with SLNB on 12/18/2020    Patient Stated Goals Reassessment after surgery    Currently in Pain? Yes    Pain Score 8     Pain Location Axilla    Pain Descriptors / Indicators Aching;Tightness    Pain Type Surgical pain    Pain Onset More than a month ago                               [facility] Adult PT Treatment/Exercise - 02/18/21 0001       Shoulder Exercises: Supine   Other Supine Exercises Wand flexion and scaption x 5, stargazer  Supine AROM flex x 5, scaption x 3  Manual Therapy   Manual Therapy Passive ROM;Soft tissue mobilization    Soft tissue mobilization Right pectoralis and lats in supine, and UT, scapular region and lats in SL to decrease tenderness    Myofascial Release to multiple areas of right upper arm, elbow and forearm areas of cording    Passive ROM PROM right shoulder flex, scaption, abd, ER with VC's to relax                         PT Long Term Goals - 01/26/21 0959       PT LONG TERM GOAL #1   Title Pt will return to baseline shoudler ROM measurements and not demonstrate any signs of lymphedema.    Time 6    Period Weeks    Status New    Target Date 03/09/21      PT LONG TERM GOAL #2   Title Pt will report decreased pain by 50% or greater    Time 6    Period Weeks    Status New    Target Date 03/09/21      PT LONG TERM GOAL #3   Title Pt will have right shoulder  flexion and abd atleast 125 degrees for improved home activies    Time 3    Period Weeks    Status New    Target Date 02/16/21      PT LONG TERM GOAL #4   Title Quick dash will be no greater than 18%    Time 6    Period Weeks    Status New    Target Date 03/09/21      PT LONG TERM GOAL #5   Title Pt will attend ABC class for education in exercises and lymphedema    Time 6    Period Weeks    Status New    Target Date 03/09/21                   Plan - 02/18/21 1655     Clinical Impression Statement Pt continues to have multiple cords in different directions in the right UE.  TG soft does seem to help some although she did not wear it in today. Pt in boost part of radiation presently and continues with mild lateral breast swelling and medial breast swelling. Soft tissue mobilization and PROM to right shoulder continued.  Pt requires VC's to relax for ROM and to depress scapula.  PT was having stomach issues today and had to leave treatment twice for the rest room.  She decided to leave a little early so she could take her medicine.    Personal Factors and Comorbidities Comorbidity 1    Comorbidities diabetes    Examination-Activity Limitations Lift;Reach Overhead;Sleep    Stability/Clinical Decision Making Stable/Uncomplicated    Rehab Potential Excellent    PT Frequency 2x / week    PT Duration 6 weeks    PT Treatment/Interventions ADLs/Self Care Home Management;Therapeutic exercise;Patient/family education;Scar mobilization;Passive range of motion;Manual lymph drainage;Manual techniques;Orthotic Fit/Training    PT Next Visit Plan Sign up ABC class, SOZO every 3 months schedule around 9/23, MFR techniques to cording, assess TG soft AAROM and PROM right shoulder, STM right upper quarter, posture work and pulleys, consider right breast MLD/foam pad    PT Home Exercise Plan post op breast exercises    Consulted and Agree with Plan of Care Patient  Patient  will benefit from skilled therapeutic intervention in order to improve the following deficits and impairments:  Postural dysfunction, Decreased knowledge of precautions, Pain, Decreased scar mobility, Decreased range of motion, Decreased strength, Impaired UE functional use  Visit Diagnosis: Abnormal posture  Malignant neoplasm of upper-outer quadrant of right breast in female, estrogen receptor positive (HCC)  Acute pain of right shoulder     Problem List Patient Active Problem List   Diagnosis Date Noted   Diarrhea 10/24/2020   Port-A-Cath in place 09/26/2020   Hypokalemia 09/26/2020   Malignant neoplasm of upper-outer quadrant of right breast in female, estrogen receptor positive ([place]) 08/15/2020   Personal history of COVID-19 07/04/2020   Mixed hyperlipidemia 07/04/2020   Diabetes mellitus type 2 in obese ([place]) 04/28/2019   Precordial pain 04/28/2019   DOE (dyspnea on exertion) 04/28/2019   Tendonitis, Achilles, right 01/17/2018   Heel pain 09/26/2015   Plantar fasciitis of right foot 10/25/2014   Equinus deformity of foot, acquired 10/25/2014   Tarsal tunnel syndrome 10/25/2014   Deformity of metatarsal bone of right foot 10/25/2014   Genital herpes, unspecified 12/21/2012   DIABETES MELLITUS II, UNCOMPLICATED [id number]   OBESITY, NOS 08/25/2006   ANEMIA, ACUTE BLOOD LOSS 08/25/2006   DEPRESSION, MAJOR, RECURRENT 08/25/2006   HEADACHE, UNSPECIFIED 08/25/2006    Elsie Ra Maydell Knoebel 02/18/2021, 5:00 PM  [place], [place], [address] Phone: [phone]   Fax:  [phone]  Name: Rachel Gaines MRN: [id number] Date of Birth: September 10, 1955  Cheral Almas, PT 02/18/21 5:04 PM

## 2021-02-19 ENCOUNTER — Ambulatory Visit: Payer: 59

## 2021-02-19 ENCOUNTER — Ambulatory Visit
Admission: RE | Admit: 2021-02-19 | Discharge: 2021-02-19 | Disposition: A | Discharge status: Home / Self Care | Payer: 59 | Source: Ambulatory Visit | Attending: Radiation Oncology | Admitting: Radiation Oncology

## 2021-02-19 DIAGNOSIS — E876 Hypokalemia: Secondary | ICD-10-CM | POA: Diagnosis not present

## 2021-02-19 DIAGNOSIS — T451X5D Adverse effect of antineoplastic and immunosuppressive drugs, subsequent encounter: Secondary | ICD-10-CM | POA: Diagnosis not present

## 2021-02-19 DIAGNOSIS — Z510 Encounter for antineoplastic radiation therapy: Secondary | ICD-10-CM | POA: Diagnosis not present

## 2021-02-19 DIAGNOSIS — C50411 Malignant neoplasm of upper-outer quadrant of right female breast: Secondary | ICD-10-CM | POA: Diagnosis not present

## 2021-02-19 DIAGNOSIS — R634 Abnormal weight loss: Secondary | ICD-10-CM | POA: Diagnosis not present

## 2021-02-19 DIAGNOSIS — G620 Drug-induced polyneuropathy: Secondary | ICD-10-CM | POA: Diagnosis not present

## 2021-02-19 DIAGNOSIS — Z170 Estrogen receptor positive status [ER+]: Secondary | ICD-10-CM | POA: Diagnosis not present

## 2021-02-19 DIAGNOSIS — Z5112 Encounter for antineoplastic immunotherapy: Secondary | ICD-10-CM | POA: Diagnosis not present

## 2021-02-19 DIAGNOSIS — R197 Diarrhea, unspecified: Secondary | ICD-10-CM | POA: Diagnosis not present

## 2021-02-20 ENCOUNTER — Encounter: Payer: Self-pay | Admitting: Radiation Oncology

## 2021-02-20 ENCOUNTER — Other Ambulatory Visit: Payer: Self-pay

## 2021-02-20 ENCOUNTER — Ambulatory Visit
Admission: RE | Admit: 2021-02-20 | Discharge: 2021-02-20 | Disposition: A | Discharge status: Home / Self Care | Payer: 59 | Source: Ambulatory Visit | Attending: Radiation Oncology | Admitting: Radiation Oncology

## 2021-02-20 DIAGNOSIS — Z170 Estrogen receptor positive status [ER+]: Secondary | ICD-10-CM | POA: Diagnosis not present

## 2021-02-20 DIAGNOSIS — Z5112 Encounter for antineoplastic immunotherapy: Secondary | ICD-10-CM | POA: Diagnosis not present

## 2021-02-20 DIAGNOSIS — C50411 Malignant neoplasm of upper-outer quadrant of right female breast: Secondary | ICD-10-CM | POA: Diagnosis not present

## 2021-02-20 DIAGNOSIS — R197 Diarrhea, unspecified: Secondary | ICD-10-CM | POA: Diagnosis not present

## 2021-02-20 DIAGNOSIS — R634 Abnormal weight loss: Secondary | ICD-10-CM | POA: Diagnosis not present

## 2021-02-20 DIAGNOSIS — T451X5D Adverse effect of antineoplastic and immunosuppressive drugs, subsequent encounter: Secondary | ICD-10-CM | POA: Diagnosis not present

## 2021-02-20 DIAGNOSIS — Z510 Encounter for antineoplastic radiation therapy: Secondary | ICD-10-CM | POA: Diagnosis not present

## 2021-02-20 DIAGNOSIS — E876 Hypokalemia: Secondary | ICD-10-CM | POA: Diagnosis not present

## 2021-02-20 DIAGNOSIS — G620 Drug-induced polyneuropathy: Secondary | ICD-10-CM | POA: Diagnosis not present

## 2021-02-23 ENCOUNTER — Other Ambulatory Visit: Payer: Self-pay

## 2021-02-23 ENCOUNTER — Ambulatory Visit (HOSPITAL_COMMUNITY)
Admission: RE | Admit: 2021-02-23 | Discharge: 2021-02-23 | Disposition: A | Discharge status: Home / Self Care | Payer: 59 | Source: Ambulatory Visit | Attending: Adult Health | Admitting: Adult Health

## 2021-02-23 ENCOUNTER — Encounter: Payer: Self-pay | Admitting: Hematology and Oncology

## 2021-02-23 DIAGNOSIS — Z0189 Encounter for other specified special examinations: Secondary | ICD-10-CM

## 2021-02-23 DIAGNOSIS — E119 Type 2 diabetes mellitus without complications: Secondary | ICD-10-CM | POA: Insufficient documentation

## 2021-02-23 DIAGNOSIS — R0600 Dyspnea, unspecified: Secondary | ICD-10-CM | POA: Diagnosis not present

## 2021-02-23 DIAGNOSIS — C50411 Malignant neoplasm of upper-outer quadrant of right female breast: Secondary | ICD-10-CM

## 2021-02-23 DIAGNOSIS — E785 Hyperlipidemia, unspecified: Secondary | ICD-10-CM | POA: Diagnosis not present

## 2021-02-23 DIAGNOSIS — Z01818 Encounter for other preprocedural examination: Secondary | ICD-10-CM | POA: Diagnosis not present

## 2021-02-23 DIAGNOSIS — Z170 Estrogen receptor positive status [ER+]: Secondary | ICD-10-CM

## 2021-02-23 LAB — ECHOCARDIOGRAM COMPLETE
Area-P 1/2: 2.76 cm2
Calc EF: 73.5 %
S' Lateral: 2.1 cm
Single Plane A2C EF: 73.7 %
Single Plane A4C EF: 73.6 %

## 2021-02-23 NOTE — Progress Notes (Signed)
                                                                                                                                                             Patient Name: Rachel Gaines MRN: [id number] DOB: 1956-04-23 Referring Physician: Nicholas Lose (Profile Not Attached) Date of Service: 02/20/2021 [facility], [place]                                                        End Of Treatment Note  Diagnoses: C50.411-Malignant neoplasm of upper-outer quadrant of right female breast  Cancer Staging: Stage IA, cT2 N0 M0, triple positive invasive ductal carcinoma of the right breast.  Intent: Curative  Radiation Treatment Dates: 01/26/2021 through 02/20/2021 Site Technique Total Dose (Gy) Dose per Fx (Gy) Completed Fx Beam Energies  Breast, Right: Breast_Rt 3D 42.56/42.56 2.66 16/16 10X  Breast, Right: Breast_Rt_Bst 3D 8/8 2 4/4 10X   Narrative: The patient tolerated radiation therapy relatively well. She developed fatigue and anticipated skin changes in the treatment field as well as tenderness and limited right shoulder range of motion.  Plan: The patient will receive a call in about one month from the radiation oncology department. She will continue follow up with Dr. Lindi Adie as well.   ________________________________________________    Carola Rhine, [facility]

## 2021-02-23 NOTE — Progress Notes (Signed)
  Echocardiogram 2D Echocardiogram has been performed.  Rachel Gaines 02/23/2021, 9:42 AM

## 2021-02-25 ENCOUNTER — Ambulatory Visit: Payer: 59

## 2021-02-25 ENCOUNTER — Encounter: Payer: Self-pay | Admitting: Hematology and Oncology

## 2021-02-25 ENCOUNTER — Other Ambulatory Visit: Payer: Self-pay

## 2021-02-25 DIAGNOSIS — G620 Drug-induced polyneuropathy: Secondary | ICD-10-CM | POA: Diagnosis not present

## 2021-02-25 DIAGNOSIS — R634 Abnormal weight loss: Secondary | ICD-10-CM | POA: Diagnosis not present

## 2021-02-25 DIAGNOSIS — R197 Diarrhea, unspecified: Secondary | ICD-10-CM | POA: Diagnosis not present

## 2021-02-25 DIAGNOSIS — C50411 Malignant neoplasm of upper-outer quadrant of right female breast: Secondary | ICD-10-CM | POA: Diagnosis not present

## 2021-02-25 DIAGNOSIS — M25511 Pain in right shoulder: Secondary | ICD-10-CM

## 2021-02-25 DIAGNOSIS — Z170 Estrogen receptor positive status [ER+]: Secondary | ICD-10-CM

## 2021-02-25 DIAGNOSIS — T451X5D Adverse effect of antineoplastic and immunosuppressive drugs, subsequent encounter: Secondary | ICD-10-CM | POA: Diagnosis not present

## 2021-02-25 DIAGNOSIS — Z5112 Encounter for antineoplastic immunotherapy: Secondary | ICD-10-CM | POA: Diagnosis not present

## 2021-02-25 DIAGNOSIS — R293 Abnormal posture: Secondary | ICD-10-CM

## 2021-02-25 DIAGNOSIS — Z510 Encounter for antineoplastic radiation therapy: Secondary | ICD-10-CM | POA: Diagnosis not present

## 2021-02-25 DIAGNOSIS — E876 Hypokalemia: Secondary | ICD-10-CM | POA: Diagnosis not present

## 2021-02-25 NOTE — Therapy (Signed)
[place], [place], [address] Phone: [phone]   Fax:  [phone]  Physical Therapy Treatment  Patient Details  Name: Rachel Gaines MRN: [id number] Date of Birth: 1956/02/12 Referring Provider (PT): Dr. Donne Hazel   Encounter Date: 02/25/2021   PT End of Session - 02/25/21 1201     Visit Number 6    Number of Visits 14    Date for PT Re-Evaluation 03/09/21    PT Start Time 1005    PT Stop Time [id number]    PT Time Calculation (min) 53 min    Activity Tolerance Patient tolerated treatment well    Behavior During Therapy [facility] for tasks assessed/performed             Past Medical History:  Diagnosis Date   Abnormal Pap smear of cervix 2014   ASCUS with negative HR HPV    Anemia    Breast cancer ([place]) 11/2020   Cancer ([place]) 06/2020   right breast IDC with DCIS   Cataract    Diabetes mellitus without complication ([place])    Hyperlipidemia    on medicine   Plantar fasciitis    STD (sexually transmitted disease)    HSV type II    Past Surgical History:  Procedure Laterality Date   BREAST LUMPECTOMY WITH RADIOACTIVE SEED AND SENTINEL LYMPH NODE BIOPSY Right 12/18/2020   Procedure: RIGHT BREAST LUMPECTOMY WITH RADIOACTIVE SEED AND RIGHT AXILLARY SENTINEL LYMPH NODE BIOPSY;  Surgeon: Rolm Bookbinder, MD;  Location: [facility];  Service: General;  Laterality: Right;   CATARACT EXTRACTION  2019   COLONOSCOPY     COLPOSCOPY W/ BIOPSY / CURETTAGE  02/04/1999   Chronic cervicitis   EYE SURGERY     PORTACATH PLACEMENT N/A 08/26/2020   Procedure: INSERTION PORT-A-CATH;  Surgeon: Rolm Bookbinder, MD;  Location: [facility];  Service: General;  Laterality: N/A;  START TIME OF 12:30 PM FOR 60 MINUTES ROOM 8    There were no vitals filed for this visit.   Subjective Assessment - 02/25/21 1006     Subjective Had last radiation on Friday and I am a little tired.  My skin is doing well but is a  little tender. Cording is still very uncomfortable. Still can't reach very high up. 2 more chemo sessions left.    Currently in Pain? Yes    Pain Score 7     Pain Location Axilla    Pain Orientation Right    Pain Descriptors / Indicators Aching;Tightness    Pain Type Surgical pain    Pain Onset More than a month ago    Pain Frequency Intermittent                               [facility] Adult PT Treatment/Exercise - 02/25/21 0001       Shoulder Exercises: Standing   Other Standing Exercises standing lat stretch and diagonal stretch at counter x 3 ea 15 sec    Other Standing Exercises standing chest stretch with right arm on wall, NTS x3      Shoulder Exercises: Pulleys   Flexion 2 minutes    Scaption 2 minutes    ABduction 2 minutes      Shoulder Exercises: Therapy Ball   Flexion 10 reps , abduction 5 reps     Manual Therapy   Myofascial Release to multiple areas of right upper arm, elbow and forearm areas  of cording    Passive ROM PROM right shoulder flex, scaption, abd, ER with VC's to relax                         PT Long Term Goals - 01/26/21 0959       PT LONG TERM GOAL #1   Title Pt will return to baseline shoudler ROM measurements and not demonstrate any signs of lymphedema.    Time 6    Period Weeks    Status New    Target Date 03/09/21      PT LONG TERM GOAL #2   Title Pt will report decreased pain by 50% or greater    Time 6    Period Weeks    Status New    Target Date 03/09/21      PT LONG TERM GOAL #3   Title Pt will have right shoulder flexion and abd atleast 125 degrees for improved home activies    Time 3    Period Weeks    Status New    Target Date 02/16/21      PT LONG TERM GOAL #4   Title Quick dash will be no greater than 18%    Time 6    Period Weeks    Status New    Target Date 03/09/21      PT LONG TERM GOAL #5   Title Pt will attend ABC class for education in exercises and lymphedema    Time 6     Period Weeks    Status New    Target Date 03/09/21                   Plan - 02/25/21 1202     Clinical Impression Statement Pt continues with right shoulder ROM limitiations limited in part by multiple cords with axillary cording less evident, but upper arm and forearm cording more evident.  She has been encouraged to stretch 2-3x's per day as some days she has only been doing once.Pt  completed radiation last week and has 2 more chemo visits left.  She always feels looser when she finishes therapy, but arm tightens up again.  Standing counter stretch for lats and single arm NTS on wall were very difficult and limited.    Personal Factors and Comorbidities Comorbidity 1    Comorbidities diabetes    Examination-Activity Limitations Lift;Reach Overhead;Sleep    Examination-Participation Restrictions Occupation    Stability/Clinical Decision Making Stable/Uncomplicated    Rehab Potential Excellent    PT Frequency 2x / week    PT Duration 6 weeks    PT Treatment/Interventions ADLs/Self Care Home Management;Therapeutic exercise;Patient/family education;Scar mobilization;Passive range of motion;Manual lymph drainage;Manual techniques;Orthotic Fit/Training    PT Next Visit Plan [facility] around Sept 23 do in therapy, cont MFR techniques to cording, cont TG soft, PROM, Right breast MLD/foam pad.    PT Home Exercise Plan post op shoulder exs, standing lat stretch    Recommended Other Services sleeve    Consulted and Agree with Plan of Care Patient             Patient will benefit from skilled therapeutic intervention in order to improve the following deficits and impairments:  Postural dysfunction, Decreased knowledge of precautions, Pain, Decreased scar mobility, Decreased range of motion, Decreased strength, Impaired UE functional use  Visit Diagnosis: Abnormal posture  Malignant neoplasm of upper-outer quadrant of right breast in female, estrogen receptor positive ([place])  Acute  pain  of right shoulder     Problem List Patient Active Problem List   Diagnosis Date Noted   Diarrhea 10/24/2020   Port-A-Cath in place 09/26/2020   Hypokalemia 09/26/2020   Malignant neoplasm of upper-outer quadrant of right breast in female, estrogen receptor positive ([place]) 08/15/2020   Personal history of COVID-19 07/04/2020   Mixed hyperlipidemia 07/04/2020   Diabetes mellitus type 2 in obese ([place]) 04/28/2019   Precordial pain 04/28/2019   DOE (dyspnea on exertion) 04/28/2019   Tendonitis, Achilles, right 01/17/2018   Heel pain 09/26/2015   Plantar fasciitis of right foot 10/25/2014   Equinus deformity of foot, acquired 10/25/2014   Tarsal tunnel syndrome 10/25/2014   Deformity of metatarsal bone of right foot 10/25/2014   Genital herpes, unspecified 12/21/2012   DIABETES MELLITUS II, UNCOMPLICATED [id number]   OBESITY, NOS 08/25/2006   ANEMIA, ACUTE BLOOD LOSS 08/25/2006   DEPRESSION, MAJOR, RECURRENT 08/25/2006   HEADACHE, UNSPECIFIED 08/25/2006    Claris Pong 02/25/2021, 12:07 PM  [place], [place], [address] Phone: [phone]   Fax:  [phone]  Name: Rachel Gaines MRN: [id number] Date of Birth: Jan 30, 1956 Cheral Almas, PT 02/25/21 12:09 PM

## 2021-02-25 NOTE — Progress Notes (Signed)
  Radiation Oncology         ([phone]) [phone] ________________________________  Name: Rachel Gaines MRN: [id number]  Date: 01/22/2021  DOB: Mar 31, 1956  SIMULATION NOTE   NARRATIVE:  The patient underwent simulation today for ongoing radiation therapy.  The existing CT study set was employed for the purpose of virtual treatment planning.  The target and avoidance structures were reviewed and modified as necessary.  Treatment planning then occurred.  The radiation boost prescription was entered and confirmed.  A total of 3 complex treatment devices were fabricated in the form of multi-leaf collimators to shape radiation around the targets while maximally excluding nearby normal structures. I have requested : Isodose Plan.    PLAN:  This modified radiation beam arrangement is intended to continue the current radiation dose to an additional 8 Gy in 4 fractions for a total cumulative dose of 50.56 Gy.    ------------------------------------------------  Jodelle Gross, MD, PhD

## 2021-03-04 ENCOUNTER — Telehealth: Payer: Self-pay | Admitting: *Deleted

## 2021-03-04 NOTE — Telephone Encounter (Signed)
Message left for Rachel Gaines  [phone]) requesting return call to complete [employer] leave form.  Unaware if needs continuation of continuous leave, intermittent, reduced work hours or a combination leave.  Also need start and end dates for type of leave.

## 2021-03-05 NOTE — Progress Notes (Signed)
Patient Care Team: Shon Baton, MD as PCP - General (Internal Medicine) Buford Dresser, MD as PCP - Cardiology (Cardiology) Mauro Kaufmann, RN as Oncology Nurse Navigator Rockwell Germany, RN as Oncology Nurse Navigator  DIAGNOSIS:    ICD-10-CM   1. Malignant neoplasm of upper-outer quadrant of right breast in female, estrogen receptor positive ([place])  C50.411    Z17.0       SUMMARY OF ONCOLOGIC HISTORY: Oncology History  Malignant neoplasm of upper-outer quadrant of right breast in female, estrogen receptor positive ([place])  08/08/2020 Initial Diagnosis   Screening detected right breast mass 2.2 cm by ultrasound upper outer quadrant biopsy: Grade 2-3 IDC with DCIS ER 60%, PR 20%, Ki-67 15%, HER-2 +3+   08/15/2020 Cancer Staging   Staging form: Breast, AJCC 8th Edition - Clinical stage from 08/15/2020: Stage IB (cT2, cN0, cM0, G3, ER+, PR+, HER2+) - Signed by Nicholas Lose, MD on 08/15/2020 Stage prefix: Initial diagnosis   09/12/2020 - 11/14/2020 Neo-Adjuvant Chemotherapy   Neoadjuvant Taxol/Herceptin x 12 weeks   09/17/2020 Surgery   Right breast lumpectomy ([place]): IDC 2.1 cm 6 SLN negative for cancer   11/21/2020 -  Adjuvant Chemotherapy   Maintenance Herceptin   01/26/2021 -  Radiation Therapy   Adjuvant radiation therapy (Dr. Lisbeth Renshaw)     CHIEF COMPLIANT:  Herceptin maintenance  INTERVAL HISTORY: Rachel Gaines is a 65 y.o. with above-mentioned history of right breast cancer currently on neoadjuvant chemotherapy with Taxol Herceptin. She presents to the clinic today for cycle 9.  She has radiation dermatitis.  She has occasional sharp pains in the breast.  She has occasional diarrhea but it has not been bothering her significantly.  ALLERGIES:  is allergic to penicillins.  MEDICATIONS:  Current Outpatient Medications  Medication Sig Dispense Refill   albuterol (VENTOLIN HFA) 108 (90 Base) MCG/ACT inhaler INHALE 1 TO 2 PUFFS BY MOUTH INTO THE LUNGS EVERY 4 TO 6  HOURS AS NEEDED FOR SHORTNESS OF BREATH/WHEEZING/COUGH URGENCY 18 g 0   benzonatate (TESSALON) 200 MG capsule TAKE 1 CAPSULE BY MOUTH 3 TIMES DAILY AS NEEDED 30 capsule 0   Biotin 5 MG CAPS Take 5 mg by mouth daily.     Cholecalciferol (VITAMIN D3) 125 MCG (5000 UT) CAPS Take 5,000 Units by mouth daily.     colestipol (COLESTID) 5 g granules Take 5 grams by mouth 2 (two) times daily. 500 g 12   Continuous Blood Gluc Sensor (FREESTYLE LIBRE 2 SENSOR) MISC Change every 14 days to monitor blood glucose continuosly     Continuous Blood Gluc Sensor (FREESTYLE LIBRE 2 SENSOR) MISC Change every 14 days to monitor blood sugar as directed 9 each 3   diclofenac Sodium (VOLTAREN) 1 % GEL Apply 2 g topically every evening.     diphenoxylate-atropine (LOMOTIL) 2.5-0.025 MG tablet Take 1 tablet by mouth 4 (four) times daily as needed for diarrhea or loose stools 30 tablet 0   empagliflozin (JARDIANCE) 10 MG TABS tablet TAKE 1 TABLET BY MOUTH ONCE A DAY 30 tablet 3   FREESTYLE LITE test strip      gabapentin (NEURONTIN) 300 MG capsule Take 300 mg by mouth at bedtime.     gabapentin (NEURONTIN) 300 MG capsule Take 1 capsule (300 mg total) by mouth 3 (three) times daily as directed. 270 capsule 3   insulin detemir (LEVEMIR) 100 UNIT/ML FlexPen INJECT 34 UNITS SUBCUTANEOUSLY TWICE DAILY 15 mL 3   Insulin Glargine (BASAGLAR KWIKPEN) 100 UNIT/ML INJECT 34 UNITS UNDER THE  SKIN TWICE DAILY (Patient not taking: Reported on 12/05/2020) 45 mL 3   insulin glargine-yfgn (SEMGLEE, YFGN,) 100 UNIT/ML Pen Inject 34 Units into the skin 2 (two) times daily. 15 mL 3   insulin lispro (HUMALOG) 100 UNIT/ML KwikPen Inject 14 Units into the skin 3 (three) times daily with meals. 45 mL 3   lidocaine-prilocaine (EMLA) cream APPLY 1 APPLICATION TOPICALLY AS NEEDED. (Patient taking differently: Apply 1 application topically daily as needed (port access).) 30 g 0   NONFORMULARY OR COMPOUNDED ITEM [facility]:  Antiinflammatory cream -  Diclofenac 3%, Baclofen 2%, Cyclobenzaprine 2%, Lidocaine 2%, dispense 120grams, apply 1-2 grams to affected area 3-4 times a day, +2refills. (Patient taking differently: [facility]:  Antiinflammatory cream - Diclofenac 3%, Baclofen 2%, Cyclobenzaprine 2%, Lidocaine 2%, dispense 120grams, apply 1-2 grams to affected area 3-4 times a day, +2refills.) 120 each 2   Pancrelipase, Lip-Prot-Amyl, (ZENPEP) 40000-126000 units CPEP Take 1 capsule by mouth in the morning and at bedtime. (Patient taking differently: Take 1 capsule by mouth 3 (three) times daily with meals.) 120 capsule    ramipril (ALTACE) 2.5 MG capsule TAKE 1 CAPSULE BY MOUTH EVERY OTHER DAY (Patient taking differently: Take 2.5 mg by mouth every other day.) 45 capsule 3   rosuvastatin (CRESTOR) 40 MG tablet TAKE 1 TABLET BY MOUTH ONCE DAILY (Patient taking differently: Take 40 mg by mouth every evening.) 90 tablet 2   sitaGLIPtin (JANUVIA) 100 MG tablet TAKE 1 TABLET BY MOUTH ONCE DAILY (Patient taking differently: Take 100 mg by mouth daily.) 30 tablet 5   UNIFINE PENTIPS 31G X 8 MM MISC USE WITH RX OF LEVEMIR AND HUMALOG FOUR TIMES DAILY.     UNIFINE PENTIPS 32G X 4 MM MISC USE WITH LEVEMIR AND HUMALOG 4X A DAY DX-E11.65     valACYclovir (VALTREX) 500 MG tablet Take 1 tablet (500 mg total) by mouth 2 (two) times daily. Take for 3 days for outbreak 30 tablet 2   No current facility-administered medications for this visit.    PHYSICAL EXAMINATION: ECOG PERFORMANCE STATUS: 1 - Symptomatic but completely ambulatory  Vitals:   03/06/21 1133  BP: (!) 137/59  Pulse: 73  Resp: 18  Temp: 97.8 F (36.6 C)  SpO2: 95%   Filed Weights   03/06/21 1133  Weight: 161 lb 6.4 oz (73.2 kg)      LABORATORY DATA:  I have reviewed the data as listed CMP Latest Ref Rng & Units 01/23/2021 12/12/2020 12/09/2020  Glucose 70 - 99 mg/dL 142(H) 102(H) 326(H)  BUN 8 - 23 mg/dL 7(L) 7(L) 5(L)  Creatinine 0.44 - 1.00 mg/dL 0.79 0.59 0.69  Sodium  135 - 145 mmol/L 144 144 136  Potassium 3.5 - 5.1 mmol/L 4.0 3.5 3.5  Chloride 98 - 111 mmol/L 109 109 102  CO2 22 - 32 mmol/L _0 Calcium 8.9 - 10.3 mg/dL 9.8 9.4 9.5  Total Protein 6.5 - 8.1 g/dL 7.1 7.5 -  Total Bilirubin 0.3 - 1.2 mg/dL 0.9 0.8 -  Alkaline Phos 38 - 126 U/L 93 81 -  AST 15 - 41 U/L 16 16 -  ALT 0 - 44 U/L 11 13 -    Lab Results  Component Value Date   WBC 7.3 01/23/2021   HGB 13.3 01/23/2021   HCT 40.1 01/23/2021   MCV 83.2 01/23/2021   PLT 217 01/23/2021   NEUTROABS 4.3 01/23/2021    ASSESSMENT & PLAN:  Malignant neoplasm of upper-outer quadrant of right breast  in female, estrogen receptor positive ([place]) 08/08/2020:Screening detected right breast mass 2.2 cm by ultrasound upper outer quadrant biopsy: Grade 2-3 IDC with DCIS ER 60%, PR 20%, Ki-67 15%, HER-2 +3+ T2N0 stage Ia Treatment plan: 1.  Neoadjuvant chemotherapy with Taxol and Herceptin weekly x12 followed by Herceptin maintenance 2. breast conserving surgery with sentinel lymph node surgery 3.  Adjuvant radiation completed 02/20/2021 4.  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 Diarrhea: Increased recently.  Added Colestipol 2. Mild fatigue 3. Hypokalemia-monitoring closely 4. Decreased appetite/weight loss 5. Peripheral Neuropathy: Gabapentin is not helping her.  She is willing to come off the gabapentin to go on the clinical trial. Encouraged her to participate in the neuropathy clinical trial I recommended participation in the phase 2 [facility] [place] 2102 study of N-Palmitoylrthanolamide vs placebo for the treatment of chemo induced peripheral neuropathy.    MRI breast  11/17/2020: Right breast malignancy 2.2 cm, 5 mm FA (MRI biopsy: Benign fat necrosis)   Letrozole counseling: We discussed the risks and benefits of anti-estrogen therapy with aromatase inhibitors. These include but not limited to insomnia, hot flashes, mood changes, vaginal dryness, bone density loss, and weight gain. We strongly believe that the benefits far outweigh the risks. Patient understands these risks and consented to starting treatment. Planned treatment duration is 7 years.  Echocardiogram: 02/23/2021: EF 65 to 70%   Return to clinic every 3 weeks for Herceptin every 6 weeks to follow-up with labs    No orders of the defined types were placed in this encounter.  The patient has a good understanding of the overall plan. she agrees with it. she will call with any problems that may develop before the next visit here.  Total time spent: 20 mins including face to face time and time spent for planning, charting and coordination of care  Rulon Eisenmenger, MD, MPH 03/06/2021  I, Thana Ates, am acting as scribe for Dr. Nicholas Lose.  I have reviewed the above documentation for accuracy and completeness, and I agree with the above.

## 2021-03-06 ENCOUNTER — Inpatient Hospital Stay (HOSPITAL_BASED_OUTPATIENT_CLINIC_OR_DEPARTMENT_OTHER): Payer: 59 | Admitting: Hematology and Oncology

## 2021-03-06 ENCOUNTER — Encounter: Payer: Self-pay | Admitting: *Deleted

## 2021-03-06 ENCOUNTER — Inpatient Hospital Stay: Payer: 59

## 2021-03-06 ENCOUNTER — Other Ambulatory Visit (HOSPITAL_COMMUNITY): Payer: Self-pay

## 2021-03-06 ENCOUNTER — Other Ambulatory Visit: Payer: Self-pay

## 2021-03-06 ENCOUNTER — Inpatient Hospital Stay: Payer: 59 | Attending: Hematology and Oncology

## 2021-03-06 DIAGNOSIS — R5383 Other fatigue: Secondary | ICD-10-CM | POA: Diagnosis not present

## 2021-03-06 DIAGNOSIS — Z170 Estrogen receptor positive status [ER+]: Secondary | ICD-10-CM

## 2021-03-06 DIAGNOSIS — R634 Abnormal weight loss: Secondary | ICD-10-CM | POA: Diagnosis not present

## 2021-03-06 DIAGNOSIS — C50411 Malignant neoplasm of upper-outer quadrant of right female breast: Secondary | ICD-10-CM

## 2021-03-06 DIAGNOSIS — Z923 Personal history of irradiation: Secondary | ICD-10-CM | POA: Insufficient documentation

## 2021-03-06 DIAGNOSIS — E876 Hypokalemia: Secondary | ICD-10-CM | POA: Diagnosis not present

## 2021-03-06 DIAGNOSIS — Z801 Family history of malignant neoplasm of trachea, bronchus and lung: Secondary | ICD-10-CM | POA: Diagnosis not present

## 2021-03-06 DIAGNOSIS — Z95828 Presence of other vascular implants and grafts: Secondary | ICD-10-CM

## 2021-03-06 DIAGNOSIS — T451X5D Adverse effect of antineoplastic and immunosuppressive drugs, subsequent encounter: Secondary | ICD-10-CM | POA: Diagnosis not present

## 2021-03-06 DIAGNOSIS — Z880 Allergy status to penicillin: Secondary | ICD-10-CM | POA: Diagnosis not present

## 2021-03-06 DIAGNOSIS — R197 Diarrhea, unspecified: Secondary | ICD-10-CM | POA: Insufficient documentation

## 2021-03-06 DIAGNOSIS — Z5112 Encounter for antineoplastic immunotherapy: Secondary | ICD-10-CM | POA: Diagnosis not present

## 2021-03-06 DIAGNOSIS — G620 Drug-induced polyneuropathy: Secondary | ICD-10-CM | POA: Insufficient documentation

## 2021-03-06 LAB — CBC WITH DIFFERENTIAL (CANCER CENTER ONLY)
Abs Immature Granulocytes: 0.02 10*3/uL (ref 0.00–0.07)
Basophils Absolute: 0 10*3/uL (ref 0.0–0.1)
Basophils Relative: 1 %
Eosinophils Absolute: 0.1 10*3/uL (ref 0.0–0.5)
Eosinophils Relative: 2 %
HCT: 42.3 % (ref 36.0–46.0)
Hemoglobin: 14.1 g/dL (ref 12.0–15.0)
Immature Granulocytes: 0 %
Lymphocytes Relative: 26 %
Lymphs Abs: 1.3 10*3/uL (ref 0.7–4.0)
MCH: 26.6 pg (ref 26.0–34.0)
MCHC: 33.3 g/dL (ref 30.0–36.0)
MCV: 79.7 fL — ABNORMAL LOW (ref 80.0–100.0)
Monocytes Absolute: 0.4 10*3/uL (ref 0.1–1.0)
Monocytes Relative: 8 %
Neutro Abs: 3.2 10*3/uL (ref 1.7–7.7)
Neutrophils Relative %: 63 %
Platelet Count: 188 10*3/uL (ref 150–400)
RBC: 5.31 MIL/uL — ABNORMAL HIGH (ref 3.87–5.11)
RDW: 14.7 % (ref 11.5–15.5)
WBC Count: 5 10*3/uL (ref 4.0–10.5)
nRBC: 0 % (ref 0.0–0.2)

## 2021-03-06 LAB — CMP (CANCER CENTER ONLY)
ALT: 14 U/L (ref 0–44)
AST: 19 U/L (ref 15–41)
Albumin: 4 g/dL (ref 3.5–5.0)
Alkaline Phosphatase: 84 U/L (ref 38–126)
Anion gap: 10 (ref 5–15)
BUN: 9 mg/dL (ref 8–23)
CO2: 26 mmol/L (ref 22–32)
Calcium: 10 mg/dL (ref 8.9–10.3)
Chloride: 109 mmol/L (ref 98–111)
Creatinine: 0.74 mg/dL (ref 0.44–1.00)
GFR, Estimated: >60 mL/min (ref >60)
Glucose, Bld: 98 mg/dL (ref 70–99)
Potassium: 4 mmol/L (ref 3.5–5.1)
Sodium: 145 mmol/L (ref 135–145)
Total Bilirubin: 1.2 mg/dL (ref 0.3–1.2)
Total Protein: 7.4 g/dL (ref 6.5–8.1)

## 2021-03-06 MED ORDER — SODIUM CHLORIDE 0.9 % IV SOLN: Freq: Once | INTRAVENOUS | Status: AC

## 2021-03-06 MED ORDER — HEPARIN SOD (PORK) LOCK FLUSH 100 UNIT/ML IV SOLN
500.0000 [IU] | Freq: Once | INTRAVENOUS | Status: AC | PRN
  Administered 2021-03-06: 500 [IU]

## 2021-03-06 MED ORDER — DIPHENHYDRAMINE HCL 25 MG PO CAPS
ORAL_CAPSULE | ORAL | Status: AC
  Administered 2021-03-06: 50 mg via ORAL
  Filled 2021-03-06: qty 2

## 2021-03-06 MED ORDER — SODIUM CHLORIDE 0.9% FLUSH
10.0000 mL | Freq: Once | INTRAVENOUS | Status: AC
  Administered 2021-03-06: 10 mL

## 2021-03-06 MED ORDER — LETROZOLE 2.5 MG PO TABS
2.5000 mg | ORAL_TABLET | Freq: Every day | ORAL | 3 refills | Status: DC
  Filled 2021-03-06: qty 90, 90d supply, fill #0

## 2021-03-06 MED ORDER — DIPHENHYDRAMINE HCL 25 MG PO CAPS: 50.0000 mg | ORAL_CAPSULE | Freq: Once | ORAL | Status: AC

## 2021-03-06 MED ORDER — ACETAMINOPHEN 325 MG PO TABS: 650.0000 mg | ORAL_TABLET | Freq: Once | ORAL | Status: AC

## 2021-03-06 MED ORDER — ACETAMINOPHEN 325 MG PO TABS
ORAL_TABLET | ORAL | Status: AC
  Administered 2021-03-06: 650 mg via ORAL
  Filled 2021-03-06: qty 2

## 2021-03-06 MED ORDER — SODIUM CHLORIDE 0.9% FLUSH
10.0000 mL | INTRAVENOUS | Status: DC | PRN
  Administered 2021-03-06: 10 mL

## 2021-03-06 MED ORDER — TRASTUZUMAB-ANNS CHEMO 150 MG IV SOLR
450.0000 mg | Freq: Once | INTRAVENOUS | Status: AC
  Administered 2021-03-06: 450 mg via INTRAVENOUS
  Filled 2021-03-06: qty 21.43

## 2021-03-06 NOTE — Telephone Encounter (Signed)
No return call and unable to connect with Tillman Sers [phone]) regarding [employer]ML[employer] form completed and returned 03/05/2021 this nurse completed as a continuation of current continuous leave of absence.  Awaiting return call for revisions if needed.

## 2021-03-06 NOTE — Patient Instructions (Signed)
[facility] MEDICAL ONCOLOGY   Discharge Instructions: Thank you for choosing [facility] to provide your oncology and hematology care.   If you have a lab appointment with the Cancer Center, please go directly to the Cancer Center and check in at the registration area.   Wear comfortable clothing and clothing appropriate for easy access to any Portacath or PICC line.   We strive to give you quality time with your provider. You may need to reschedule your appointment if you arrive late (15 or more minutes).  Arriving late affects you and other patients whose appointments are after yours.  Also, if you miss three or more appointments without notifying the office, you may be dismissed from the clinic at the provider's discretion.      For prescription refill requests, have your pharmacy contact our office and allow 72 hours for refills to be completed.    Today you received the following chemotherapy and/or immunotherapy agents Trastuzumab-anns     To help prevent nausea and vomiting after your treatment, we encourage you to take your nausea medication as directed.  BELOW ARE SYMPTOMS THAT SHOULD BE REPORTED IMMEDIATELY: *FEVER GREATER THAN 100.4 F (38 C) OR HIGHER *CHILLS OR SWEATING *NAUSEA AND VOMITING THAT IS NOT CONTROLLED WITH YOUR NAUSEA MEDICATION *UNUSUAL SHORTNESS OF BREATH *UNUSUAL BRUISING OR BLEEDING *URINARY PROBLEMS (pain or burning when urinating, or frequent urination) *BOWEL PROBLEMS (unusual diarrhea, constipation, pain near the anus) TENDERNESS IN MOUTH AND THROAT WITH OR WITHOUT PRESENCE OF ULCERS (sore throat, sores in mouth, or a toothache) UNUSUAL RASH, SWELLING OR PAIN  UNUSUAL VAGINAL DISCHARGE OR ITCHING   Items with * indicate a potential emergency and should be followed up as soon as possible or go to the Emergency Department if any problems should occur.  Please show the CHEMOTHERAPY ALERT CARD or IMMUNOTHERAPY ALERT CARD at  check-in to the Emergency Department and triage nurse.  Should you have questions after your visit or need to cancel or reschedule your appointment, please contact [facility] MEDICAL [facility]COLOG[facility]  Dept: [phone]  and follow the prompts.  Office hours are 8:00 a.m. to 4:30 p.m. Monday - Friday. Please note that voicemails left after 4:00 p.m. may not be returned until the following business day.  We are closed weekends and major holidays. You have access to a nurse at all times for urgent questions. Please call the main number to the clinic Dept: [phone] and follow the prompts.   For any non-urgent questions, you may also contact your provider using MyChart. We now offer e-Visits for anyone 18 and older to request care online for non-urgent symptoms. For details visit [email or web address].   Also download the MyChart app! Go to the app store, search "MyChart", open the app, select [facility], and log in with your MyChart username and password.  Due to Covid, a mask is required upon entering the hospital/clinic. If you do not have a mask, one will be given to you upon arrival. For doctor visits, patients may have 1 support person aged 18 or older with them. For treatment visits, patients cannot have anyone with them due to current Covid guidelines and our immunocompromised population.   

## 2021-03-06 NOTE — Assessment & Plan Note (Signed)
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 Diarrhea:Increased recently.  Added Colestipol 2. Mild fatigue 3. Hypokalemia-monitoring closely 4. Decreased appetite/weight loss 5. Peripheral Neuropathy: taxol has completed.Stable symptoms.  MRI breast5/23/2022: Right breast malignancy 2.2 cm, 5 mm FA (MRI biopsy: Benign fat necrosis)  Echocardiogram: 02/23/2021: EF 65 to 70%  Return to clinic every 3 weeks for Herceptin every 6 weeks to follow-up with labs 

## 2021-03-06 NOTE — Research (Signed)
[facility]-[place]-2102 - TREATMENT OF ESTABLISHED CHEMOTHERAPY-INDUCED NEUROPATHY WITH N-PALMITOYLETHANOLAMIDE, A CANNABIMIMETIC NUTRACEUTICAL: A RANDOMIZED DOUBLE-BLIND PHASE II PILOT TRIAL   Patient Rachel Gaines was identified by Dr. Lindi Adie as a potential candidate for the above listed study.  This Clinical Research Nurse met with Makala Fetterolf, [id number], on 03/06/21 in a manner and location that ensures patient privacy to discuss participation in the above listed research study.  Patient is Unaccompanied.  A copy of the informed consent document with embedded HIPAA language was provided to the patient.  Patient reads, speaks, and understands [place].   Patient was provided with the business card of this Nurse and encouraged to contact the research team with any questions.  Approximately 10 minutes were spent with the patient reviewing the informed consent documents.  Patient was provided the option of taking informed consent documents home to review and was encouraged to review at their convenience with their support network, including other care providers. Patient took the consent documents home to review.  The pt asked that the research nurse call her on 03/11/21 to discuss her participation in the study.  The pt was given the consent form, the [facility] brochure to review as well as the nurse's business card.  The pt was thanked for her interest in the study.  Brion Aliment RN, BSN, CCRP Clinical Research Nurse Lead 03/06/2021 12:25 PM

## 2021-03-11 ENCOUNTER — Ambulatory Visit: Payer: 59 | Attending: Radiation Oncology | Admitting: Physical Therapy

## 2021-03-11 ENCOUNTER — Encounter: Payer: Self-pay | Admitting: *Deleted

## 2021-03-11 ENCOUNTER — Encounter: Payer: Self-pay | Admitting: Hematology and Oncology

## 2021-03-11 ENCOUNTER — Encounter: Payer: Self-pay | Admitting: Physical Therapy

## 2021-03-11 ENCOUNTER — Other Ambulatory Visit: Payer: Self-pay

## 2021-03-11 DIAGNOSIS — R293 Abnormal posture: Secondary | ICD-10-CM | POA: Diagnosis not present

## 2021-03-11 DIAGNOSIS — M25611 Stiffness of right shoulder, not elsewhere classified: Secondary | ICD-10-CM | POA: Diagnosis not present

## 2021-03-11 DIAGNOSIS — C50411 Malignant neoplasm of upper-outer quadrant of right female breast: Secondary | ICD-10-CM

## 2021-03-11 DIAGNOSIS — L599 Disorder of the skin and subcutaneous tissue related to radiation, unspecified: Secondary | ICD-10-CM

## 2021-03-11 DIAGNOSIS — M25511 Pain in right shoulder: Secondary | ICD-10-CM | POA: Diagnosis not present

## 2021-03-11 DIAGNOSIS — Z170 Estrogen receptor positive status [ER+]: Secondary | ICD-10-CM | POA: Insufficient documentation

## 2021-03-11 NOTE — Telephone Encounter (Signed)
Mailed [employer] form to home address per EMR.

## 2021-03-11 NOTE — Therapy (Signed)
[place], [place], [address] Phone: [phone]   Fax:  [phone]  Physical Therapy Treatment  Patient Details  Name: Rachel Gaines MRN: [id number] Date of Birth: 09-Feb-1956 Referring Provider (PT): Dr. Donne Hazel   Encounter Date: 03/11/2021   PT End of Session - 03/11/21 1201     Visit Number 7    Number of Visits 15    Date for PT Re-Evaluation 04/08/21    PT Start Time 1106    PT Stop Time [id number]    PT Time Calculation (min) 52 min    Activity Tolerance Patient tolerated treatment well    Behavior During Therapy [facility] for tasks assessed/performed             Past Medical History:  Diagnosis Date   Abnormal Pap smear of cervix 2014   ASCUS with negative HR HPV    Anemia    Breast cancer ([place]) 11/2020   Cancer ([place]) 06/2020   right breast IDC with DCIS   Cataract    Diabetes mellitus without complication ([place])    Hyperlipidemia    on medicine   Plantar fasciitis    STD (sexually transmitted disease)    HSV type II    Past Surgical History:  Procedure Laterality Date   BREAST LUMPECTOMY WITH RADIOACTIVE SEED AND SENTINEL LYMPH NODE BIOPSY Right 12/18/2020   Procedure: RIGHT BREAST LUMPECTOMY WITH RADIOACTIVE SEED AND RIGHT AXILLARY SENTINEL LYMPH NODE BIOPSY;  Surgeon: Rolm Bookbinder, MD;  Location: [place];  Service: General;  Laterality: Right;   CATARACT EXTRACTION  2019   COLONOSCOPY     COLPOSCOPY W/ BIOPSY / CURETTAGE  02/04/1999   Chronic cervicitis   EYE SURGERY     PORTACATH PLACEMENT N/A 08/26/2020   Procedure: INSERTION PORT-A-CATH;  Surgeon: Rolm Bookbinder, MD;  Location: [place];  Service: General;  Laterality: N/A;  START TIME OF 12:30 PM FOR 60 MINUTES ROOM 8    There were no vitals filed for this visit.   Subjective Assessment - 03/11/21 1106     Subjective When it is time to go to bed I just can't get comfortable. I have been doing the  exercises. It is sore all in my upper arm like a pulling. I use voltaren gel because I am in so much pain.    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  Pt had right lumpectomy with SLNB on 12/18/2020    Patient Stated Goals Reassessment after surgery    Currently in Pain? No/denies    Pain Score 0-No pain                               [facility] Adult PT Treatment/Exercise - 03/11/21 0001       Manual Therapy   Soft tissue mobilization in L S/L to R lats, serratus and external rotators with numerous trigger points noted    Myofascial Release cross friction massage to rotator cuff tendon insertion    Passive ROM PROM right shoulder flex, scaption, abd, ER with VC's to relax - pt with increased R upper arm pain with all movements                          PT Long Term Goals - 03/11/21 1200       PT LONG  TERM GOAL #1   Title Pt will return to baseline shoudler ROM measurements and not demonstrate any signs of lymphedema.    Period Weeks    Status On-going      PT LONG TERM GOAL #2   Title Pt will report decreased pain by 50% or greater    Baseline 03/11/21- pt still having signficant pain in shoulder    Time 6    Period Weeks    Status On-going      PT LONG TERM GOAL #3   Title Pt will have right shoulder flexion and abd atleast 125 degrees for improved home activies    Time 3    Period Weeks    Status On-going      PT LONG TERM GOAL #4   Title Quick dash will be no greater than 18%    Time 6    Period Weeks    Status On-going      PT LONG TERM GOAL #5   Title Pt will attend ABC class for education in exercises and lymphedema    Time 6    Period Weeks    Status On-going                   Plan - 03/11/21 1201     Clinical Impression Statement Pt having significant pain in R shoulder with point tenderness to insertion of rotator cuff  tendons. Performed cross friction massage to this area to help decrease pain. Pt is very limited with R shoulder ROM due to pain and has difficulty sleeping at night due to pain. Also performed soft tissue mobilization to shoulder external rotators and lats with numerous trigger points noted. Pt would benefit from additional skilled PT services to improve R shoulder ROM, decrease pain, improve strength and progress pt towards independence with a home exercise program. She would also benefit from ionto therapy to see if that would help decrease pain.    PT Frequency 2x / week    PT Duration 6 weeks    PT Treatment/Interventions ADLs/Self Care Home Management;Therapeutic exercise;Patient/family education;Scar mobilization;Passive range of motion;Manual lymph drainage;Manual techniques;Orthotic Fit/Training;Iontophoresis '4mg'$ /ml Dexamethasone    PT Next Visit Plan see if pt can schedule 2x/wk, begin ionto if cert signed, SoZO around Sept 23 do in therapy, cont MFR techniques to cording, cont TG soft, PROM, Right breast MLD/foam pad.    PT Home Exercise Plan post op shoulder exs, standing lat stretch    Consulted and Agree with Plan of Care Patient             Patient will benefit from skilled therapeutic intervention in order to improve the following deficits and impairments:  Postural dysfunction, Decreased knowledge of precautions, Pain, Decreased scar mobility, Decreased range of motion, Decreased strength, Impaired UE functional use  Visit Diagnosis: Stiffness of right shoulder, not elsewhere classified  Acute pain of right shoulder  Disorder of the skin and subcutaneous tissue related to radiation, unspecified  Abnormal posture  Malignant neoplasm of upper-outer quadrant of right breast in female, estrogen receptor positive ([place])     Problem List Patient Active Problem List   Diagnosis Date Noted   Diarrhea 10/24/2020   Port-A-Cath in place 09/26/2020   Hypokalemia 09/26/2020    Malignant neoplasm of upper-outer quadrant of right breast in female, estrogen receptor positive ([place]) 08/15/2020   Personal history of COVID-19 07/04/2020   Mixed hyperlipidemia 07/04/2020   Diabetes mellitus type 2 in obese ([place]) 04/28/2019   Precordial pain  04/28/2019   DOE (dyspnea on exertion) 04/28/2019   Tendonitis, Achilles, right 01/17/2018   Heel pain 09/26/2015   Plantar fasciitis of right foot 10/25/2014   Equinus deformity of foot, acquired 10/25/2014   Tarsal tunnel syndrome 10/25/2014   Deformity of metatarsal bone of right foot 10/25/2014   Genital herpes, unspecified 12/21/2012   DIABETES MELLITUS II, UNCOMPLICATED [id number]   OBESITY, NOS 08/25/2006   ANEMIA, ACUTE BLOOD LOSS 08/25/2006   DEPRESSION, MAJOR, RECURRENT 08/25/2006   HEADACHE, UNSPECIFIED 08/25/2006    Manus Gunning, PT 03/11/2021, 12:08 PM  [place], [place], [address] Phone: [phone]   Fax:  [phone]  Name: Kaliya Reddin MRN: [id number] Date of Birth: May 03, 1956   Manus Gunning, PT 03/11/21 12:09 PM

## 2021-03-11 NOTE — Research (Signed)
ACCRU--2102 - TREATMENT OF ESTABLISHED CHEMOTHERAPY-INDUCED NEUROPATHY WITH N-PALMITOYLETHANOLAMIDE, A CANNABIMIMETIC NUTRACEUTICAL: A RANDOMIZED DOUBLE-BLIND PHASE II PILOT TRIAL   Research nurse called and spoke to the pt about the clinical trial this afternoon.  The pt said that she had read over the consent form, and she was interested in the study.  The research nurse explained to the pt about the study enrollment procedures and the on-study questionnaires and weekly calls.  The research nurse along with Donell Sievert, RN reviewed the pt's chart for eligibility purposes.  The pt was deemed eligible to proceed with screening and possible enrollment.  The pt agreed to meet with the nurse on Friday, 03/13/21 at 2 pm to go over the consent in detail and to answer any questions about the study.  The pt was thanked for her interest. Dr. Lindi Adie confirmed the pt's life expectancy is > than 6 months.  He also confirmed that the pt has no evidence of residual disease.  Dr. Lindi Adie also confirmed that her peripheral neuropathy symptoms are related to her past chemo.    Brion Aliment RN, BSN, CCRP Clinical Research Nurse Lead 03/11/2021 2:34 PM

## 2021-03-13 ENCOUNTER — Other Ambulatory Visit: Payer: Self-pay

## 2021-03-13 ENCOUNTER — Encounter: Payer: Self-pay | Admitting: *Deleted

## 2021-03-13 ENCOUNTER — Inpatient Hospital Stay: Payer: 59 | Admitting: *Deleted

## 2021-03-13 ENCOUNTER — Other Ambulatory Visit (HOSPITAL_COMMUNITY): Payer: Self-pay

## 2021-03-13 DIAGNOSIS — C50411 Malignant neoplasm of upper-outer quadrant of right female breast: Secondary | ICD-10-CM

## 2021-03-13 MED FILL — Ramipril Cap 2.5 MG: ORAL | 90 days supply | Qty: 45 | Fill #2 | Status: AC

## 2021-03-13 MED FILL — Sitagliptin Phosphate Tab 100 MG (Base Equiv): ORAL | 30 days supply | Qty: 30 | Fill #4 | Status: AC

## 2021-03-13 NOTE — Research (Signed)
ACCRU-[place]-2102 - TREATMENT OF ESTABLISHED CHEMOTHERAPY-INDUCED NEUROPATHY WITH N-PALMITOYLETHANOLAMIDE, A CANNABIMIMETIC NUTRACEUTICAL: A RANDOMIZED DOUBLE-BLIND PHASE II PILOT TRIAL   Patient Rachel Gaines was identified by Dr. Lindi Adie  as a potential candidate for the above listed study.  This Clinical Research Nurse met with Fumi Guadron, [id number] on 03/13/21 in a manner and location that ensures patient privacy to discuss participation in the above listed research study.  Patient is Unaccompanied.  Patient was previously provided with informed consent documents.  Patient confirmed they have read the informed consent documents.  As outlined in the informed consent form, this Nurse and Tillman Sers discussed the purpose of the research study, the investigational nature of the study, study procedures and requirements for study participation, potential risks and benefits of study participation, as well as alternatives to participation.  This study is blinded or double-blinded. The patient understands participation is voluntary and they may withdraw from study participation at any time.  Each study arm was reviewed, and randomization discussed.  Potential side effects were reviewed with patient as outlined in the consent form, and patient made aware there may be side effects not yet known. The chance of receiving placebo was discussed. Patient understands enrollment is pending full eligibility review.   Confidentiality and how the patient's information will be used as part of study participation were discussed.  Patient was informed there is not reimbursement provided for their time and effort spent on trial participation.  The patient is encouraged to discuss research study participation with their insurance provider to determine what costs they may incur as part of study participation, including research related injury.    All questions were answered to patient's satisfaction.  The informed consent with  embedded HIPAA language was reviewed page by page.  The patient's mental and emotional status is appropriate to provide informed consent, and the patient verbalizes an understanding of study participation.  Patient has agreed to participate in the above listed research study and has voluntarily signed the informed consent version 08/15/20 with embedded HIPAA language, version 08/15/20  on 03/13/21 at 2:42PM.  The patient was provided with a copy of the signed informed consent form with embedded HIPAA language for their reference.  No study specific procedures were obtained prior to the signing of the informed consent document.  Approximately 55 minutes were spent with the patient reviewing the informed consent documents.  After obtaining informed consent patient, voluntarily signed the optional Release of Information form for use throughout trial participation.   The research nurse asked the screening questionnaire questions to the pt this afternoon.  The pt rated her numbness, tingling/pain at an "8".  She confirmed that she can swallow pills.  She denied taking any medication/supplement containing palmitoylethanolamide (PEA).  She denied the following medications:  opiods, duloxetine, and pregabalin.  She reported that she took gabapentin last night on 03/12/21.  The pt was told that she has to be off this medication greater than or equal to 1 week prior to registration.  The pt said that she would not take anymore gabapentin.  The research nurse told the pt that she would call her next Thursday to confirm that she has been off the gabapentin for 1 week.  Pt is 65 years old and is not of child-bearing potential.  Therefore, no pregnancy test was completed.  The pt's last menstrual period is recorded as 06/28/2006.  The pt was given the baseline questionnaire to take home.  The research nurse will inform the pt the date of  her registration, and the pt stated that she will complete the questionnaire on this date.  The  research nurse will call the pt next week to confirm that she has remained off of her gabapentin for 7 days.  Will continue to review the pt's eligibility criteria.  The pt was thanked for her interest in the study.    Brion Aliment RN, BSN, CCRP Clinical Research Nurse Lead 03/13/2021 3:40 PM

## 2021-03-16 ENCOUNTER — Other Ambulatory Visit (HOSPITAL_COMMUNITY): Payer: Self-pay

## 2021-03-17 ENCOUNTER — Other Ambulatory Visit (HOSPITAL_COMMUNITY): Payer: Self-pay

## 2021-03-18 ENCOUNTER — Ambulatory Visit: Payer: 59 | Admitting: Physical Therapy

## 2021-03-18 ENCOUNTER — Other Ambulatory Visit (HOSPITAL_COMMUNITY): Payer: Self-pay

## 2021-03-19 ENCOUNTER — Telehealth: Payer: Self-pay | Admitting: *Deleted

## 2021-03-19 ENCOUNTER — Other Ambulatory Visit (HOSPITAL_COMMUNITY): Payer: Self-pay

## 2021-03-19 ENCOUNTER — Other Ambulatory Visit: Payer: Self-pay | Admitting: Hematology and Oncology

## 2021-03-19 MED ORDER — BENZONATATE 200 MG PO CAPS
200.0000 mg | ORAL_CAPSULE | Freq: Three times a day (TID) | ORAL | 1 refills | Status: DC | PRN
  Filled 2021-03-19: qty 30, 10d supply, fill #0

## 2021-03-19 NOTE — Progress Notes (Signed)
Complaining of persistent cough for 3 days OTC medications were not helping.  I sent a prescription for Tessalon

## 2021-03-19 NOTE — Telephone Encounter (Signed)
ACCRU-[place]-2102 - TREATMENT OF ESTABLISHED CHEMOTHERAPY-INDUCED NEUROPATHY WITH N-PALMITOYLETHANOLAMIDE, A CANNABIMIMETIC NUTRACEUTICAL: A RANDOMIZED DOUBLE-BLIND PHASE II PILOT TRIAL   The research nurse called the pt today to confirm that she has remained off of her gabapentin for 7 days.  The pt did not answer her phone, and her voice message was full.  The nurse will attempt to reach the pt tomorrow to see if she meets study eligibility criteria to enroll in this study.   Brion Aliment RN, BSN, CCRP Clinical Research Nurse Lead 03/19/2021 4:55 PM

## 2021-03-19 NOTE — Telephone Encounter (Signed)
Rachel Gaines states she has a bad cough X 3 days. OTC meds are not helping. Wants to know if Dr Lindi Adie can send in something for her. Will test for covid. Is requesting a refill of tessalon.

## 2021-03-23 ENCOUNTER — Telehealth: Payer: Self-pay | Admitting: *Deleted

## 2021-03-23 NOTE — Telephone Encounter (Signed)
ACCRU-[place]-2102 - TREATMENT OF ESTABLISHED CHEMOTHERAPY-INDUCED NEUROPATHY WITH N-PALMITOYLETHANOLAMIDE, A CANNABIMIMETIC NUTRACEUTICAL: A RANDOMIZED DOUBLE-BLIND PHASE II PILOT TRIAL   Research nurse called the pt back again this afternoon to review the Screening Questionnaire with her over the phone.  The research nurse went over this form with the pt on 03/13/21.  This form must be completed within 7 days prior to registration.  The pt is scheduled to be registered on 03/25/21.  Therefore, the nurse asked the pt if she had time to go over the questions again.  The pt verbalized understanding and provided her responses to the screening questions.  The pt stated that her "numbness, tingling, and pain in her fingers/toes in the past week" was still an "8".  The pt previously responded an "8" on 03/13/21.  The pt confirmed she can swallow pills.  She denies taking any medication/supplement with palmitoylethanolamide (PEA).  The pt also denies taking the following medications in the past week:  opiods, duloxetine, gabapentin, and pregabalin.  She also denies taking or planning to take any cannabis products [facility] and/or CBD).  The pt was thanked for her time.  The pt was reminded to complete her baseline questionnaire on Wednesday morning prior to her study registration.  Brion Aliment RN, BSN, CCRP Clinical Research Nurse Lead 03/23/2021 3:57 PM

## 2021-03-23 NOTE — Telephone Encounter (Signed)
ACCRU-[place]-2102 - TREATMENT OF ESTABLISHED CHEMOTHERAPY-INDUCED NEUROPATHY WITH N-PALMITOYLETHANOLAMIDE, A CANNABIMIMETIC NUTRACEUTICAL: A RANDOMIZED DOUBLE-BLIND PHASE II PILOT TRIAL   Research nurse called and spoke to the pt this afternoon.  The pt confirmed that she has been off of her gabapentin since 03/13/21.  The pt's last dose of gabapentin was 03/12/21.  The pt said that she was still interested in participation in the study.  The pt had no questions/concerns for the research nurse.  The research nurse will have a 2nd nurse review her eligibility and then have Dr. Lindi Adie do the final eligibility review by Wednesday, 03/25/21.  The nurse will register the pt on Wednesday, and the nurse will dispense the pt's study drug to her on Friday, 03/27/21 during her scheduled infusion appt. The pt verbalized understanding.  The pt was told to complete her baseline questionnaire on Wednesday morning -the day of her registration. The pt verbalized understanding. Donell Sievert, Rn confirmed that she will do the 2nd RN review.   Brion Aliment RN, BSN, CCRP Clinical Research Nurse Lead 03/23/2021 2:01 PM

## 2021-03-25 ENCOUNTER — Other Ambulatory Visit: Payer: Self-pay | Admitting: *Deleted

## 2021-03-25 ENCOUNTER — Encounter: Payer: Self-pay | Admitting: *Deleted

## 2021-03-25 ENCOUNTER — Other Ambulatory Visit (HOSPITAL_COMMUNITY): Payer: Self-pay

## 2021-03-25 ENCOUNTER — Ambulatory Visit: Payer: 59

## 2021-03-25 ENCOUNTER — Encounter: Payer: Self-pay | Admitting: Hematology and Oncology

## 2021-03-25 ENCOUNTER — Other Ambulatory Visit: Payer: Self-pay

## 2021-03-25 DIAGNOSIS — R293 Abnormal posture: Secondary | ICD-10-CM | POA: Diagnosis not present

## 2021-03-25 DIAGNOSIS — Z170 Estrogen receptor positive status [ER+]: Secondary | ICD-10-CM

## 2021-03-25 DIAGNOSIS — C50411 Malignant neoplasm of upper-outer quadrant of right female breast: Secondary | ICD-10-CM | POA: Diagnosis not present

## 2021-03-25 DIAGNOSIS — M25511 Pain in right shoulder: Secondary | ICD-10-CM

## 2021-03-25 DIAGNOSIS — M25611 Stiffness of right shoulder, not elsewhere classified: Secondary | ICD-10-CM | POA: Diagnosis not present

## 2021-03-25 DIAGNOSIS — L599 Disorder of the skin and subcutaneous tissue related to radiation, unspecified: Secondary | ICD-10-CM

## 2021-03-25 NOTE — Therapy (Signed)
[place], [place], [address] Phone: [phone]   Fax:  [phone]  Physical Therapy Treatment  Patient Details  Name: Rachel Gaines MRN: [id number] Date of Birth: 11/14/1955 Referring Provider (PT): Dr. Donne Hazel   Encounter Date: 03/25/2021   PT End of Session - 03/25/21 1422     Visit Number 8    Number of Visits 15    Date for PT Re-Evaluation 04/08/21    PT Start Time 2951    PT Stop Time 8841    PT Time Calculation (min) 55 min    Activity Tolerance Patient tolerated treatment well    Behavior During Therapy [facility] for tasks assessed/performed             Past Medical History:  Diagnosis Date   Abnormal Pap smear of cervix 2014   ASCUS with negative HR HPV    Anemia    Breast cancer ([place]) 11/2020   Cancer ([place]) 06/2020   right breast IDC with DCIS   Cataract    Diabetes mellitus without complication ([place])    Hyperlipidemia    on medicine   Plantar fasciitis    STD (sexually transmitted disease)    HSV type II    Past Surgical History:  Procedure Laterality Date   BREAST LUMPECTOMY WITH RADIOACTIVE SEED AND SENTINEL LYMPH NODE BIOPSY Right 12/18/2020   Procedure: RIGHT BREAST LUMPECTOMY WITH RADIOACTIVE SEED AND RIGHT AXILLARY SENTINEL LYMPH NODE BIOPSY;  Surgeon: Rolm Bookbinder, MD;  Location: [place];  Service: General;  Laterality: Right;   CATARACT EXTRACTION  2019   COLONOSCOPY     COLPOSCOPY W/ BIOPSY / CURETTAGE  02/04/1999   Chronic cervicitis   EYE SURGERY     PORTACATH PLACEMENT N/A 08/26/2020   Procedure: INSERTION PORT-A-CATH;  Surgeon: Rolm Bookbinder, MD;  Location: [place];  Service: General;  Laterality: N/A;  START TIME OF 12:30 PM FOR 60 MINUTES ROOM 8    There were no vitals filed for this visit.   Subjective Assessment - 03/25/21 1405     Subjective Still uncomfortable to stretch my arm out to sleep and when I reach up high. This  morning I had some pain at the medial arm, but not today.    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  Pt had right lumpectomy with SLNB on 12/18/2020    Patient Stated Goals Reassessment after surgery    Currently in Pain? No/denies    Pain Score 0-No pain                    L-DEX FLOWSHEETS - 03/25/21 1400       L-DEX LYMPHEDEMA SCREENING   Measurement Type Unilateral    L-DEX MEASUREMENT EXTREMITY Upper Extremity    POSITION  Standing    DOMINANT SIDE Right    At Risk Side Right    BASELINE SCORE (UNILATERAL) -4.6    L-DEX SCORE (UNILATERAL) -3.9    VALUE CHANGE (UNILAT) 0.7                       [facility] Adult PT Treatment/Exercise - 03/25/21 0001       Shoulder Exercises: Seated   Other Seated Exercises AROM bilateral shoulder flexion, scaption, horizontal abd x5      Shoulder Exercises: Pulleys   Flexion 2 minutes    Scaption 2 minutes  ABduction 2 minutes      Shoulder Exercises: Therapy Ball   Flexion Both;10 reps      Manual Therapy   Soft tissue mobilization supine to pectoralis and right lats    Myofascial Release to multiple areas of right upper arm, elbow and forearm areas of cording    Passive ROM PROM right shoulder flex, scaption, abd, ER with VC's to relax                          PT Long Term Goals - 03/11/21 1200       PT LONG TERM GOAL #1   Title Pt will return to baseline shoudler ROM measurements and not demonstrate any signs of lymphedema.    Period Weeks    Status On-going      PT LONG TERM GOAL #2   Title Pt will report decreased pain by 50% or greater    Baseline 03/11/21- pt still having signficant pain in shoulder    Time 6    Period Weeks    Status On-going      PT LONG TERM GOAL #3   Title Pt will have right shoulder flexion and abd atleast 125 degrees for improved home activies    Time 3    Period  Weeks    Status On-going      PT LONG TERM GOAL #4   Title Quick dash will be no greater than 18%    Time 6    Period Weeks    Status On-going      PT LONG TERM GOAL #5   Title Pt will attend ABC class for education in exercises and lymphedema    Time 6    Period Weeks    Status On-going                   Plan - 03/25/21 1424     Clinical Impression Statement Performed SOZO with good result noted. Continued Soft tissue mobilization, MFR to cording, and PROM to right shoulder.  Shoulder ROM is improving and she has not had much right posterior shoulder pain since last visit.  She is tender in the right pectorals, and right breast. She is unable to wear her compression bra presently because it is too uncomfortable because of skin sensitivity from radiation.  Cording is not visible but is still palpable in axillary region and upper axilary region.    Personal Factors and Comorbidities Comorbidity 1    Comorbidities diabetes    Examination-Activity Limitations Lift;Reach Overhead;Sleep    Examination-Participation Restrictions Occupation    Stability/Clinical Decision Making Stable/Uncomplicated    Rehab Potential Excellent    PT Frequency 2x / week    PT Duration 6 weeks    PT Treatment/Interventions ADLs/Self Care Home Management;Therapeutic exercise;Patient/family education;Scar mobilization;Passive range of motion;Manual lymph drainage;Manual techniques;Orthotic Fit/Training;Iontophoresis 4mg /ml Dexamethasone    PT Next Visit Plan see if pt can schedule 2x/wk, begin ionto if cert signed,, cont MFR techniques to cording, cont TG soft, PROM, Right breast MLD/foam pad.    PT Home Exercise Plan post op shoulder exs, standing lat stretch    Consulted and Agree with Plan of Care Patient             Patient will benefit from skilled therapeutic intervention in order to improve the following deficits and impairments:  Postural dysfunction, Decreased knowledge of precautions,  Pain, Decreased scar mobility, Decreased range of motion, Decreased strength, Impaired UE  functional use  Visit Diagnosis: Stiffness of right shoulder, not elsewhere classified  Acute pain of right shoulder  Disorder of the skin and subcutaneous tissue related to radiation, unspecified  Abnormal posture  Malignant neoplasm of upper-outer quadrant of right breast in female, estrogen receptor positive ([place])     Problem List Patient Active Problem List   Diagnosis Date Noted   Diarrhea 10/24/2020   Port-A-Cath in place 09/26/2020   Hypokalemia 09/26/2020   Malignant neoplasm of upper-outer quadrant of right breast in female, estrogen receptor positive ([place]) 08/15/2020   Personal history of COVID-19 07/04/2020   Mixed hyperlipidemia 07/04/2020   Diabetes mellitus type 2 in obese ([place]) 04/28/2019   Precordial pain 04/28/2019   DOE (dyspnea on exertion) 04/28/2019   Tendonitis, Achilles, right 01/17/2018   Heel pain 09/26/2015   Plantar fasciitis of right foot 10/25/2014   Equinus deformity of foot, acquired 10/25/2014   Tarsal tunnel syndrome 10/25/2014   Deformity of metatarsal bone of right foot 10/25/2014   Genital herpes, unspecified 12/21/2012   DIABETES MELLITUS II, UNCOMPLICATED 38/87/1959   OBESITY, NOS 08/25/2006   ANEMIA, ACUTE BLOOD LOSS 08/25/2006   DEPRESSION, MAJOR, RECURRENT 08/25/2006   HEADACHE, UNSPECIFIED 08/25/2006    Claris Pong, PT 03/25/2021, 3:01 PM  [place], [place], [address] Phone: [phone]   Fax:  [phone]  Name: Rachel Gaines MRN: [id number] Date of Birth: April 07, 1956

## 2021-03-25 NOTE — Research (Signed)
ACCRU-[place]-2102 - TREATMENT OF ESTABLISHED CHEMOTHERAPY-INDUCED NEUROPATHY WITH N-PALMITOYLETHANOLAMIDE, A CANNABIMIMETIC NUTRACEUTICAL: A RANDOMIZED DOUBLE-BLIND PHASE II PILOT TRIAL   Patient Rachel Gaines, was registered to the above listed study, assigned study #[id number]. Randomization is required for this study. Randomization information to follow.    Patient Rachel Gaines is randomized to Arm 1.  Stratification criteria were confirmed by this clinical research Nurse and clinical research Nurse, Donell Sievert, verified the stratification factors used for randomization.  As part of the assigned treatment arm, patient Rachel Gaines will receive 1 capsule daily.  This is a blinded study, and the research team (MD, RN, and patient) does not know if the pt is receiving the study drug, PEA, or the placebo.  MD will be notified of patient dosing assignment (one capsule daily); treatment will begin 03/27/2021.  Per study protocol, patient Rachel Gaines will be unblinded of her treatment assignment at the end of her 8 weeks of study participation once all of her study data is completed and verified by the study team.  Patient Rachel Gaines is successfully enrolled in the above study.   Brion Aliment RN, BSN, CCRP Clinical Research Nurse Lead 03/25/2021 2:31 PM

## 2021-03-25 NOTE — Research (Signed)
ACCRU-[place]-2102 - TREATMENT OF ESTABLISHED CHEMOTHERAPY-INDUCED NEUROPATHY WITH N-PALMITOYLETHANOLAMIDE, A CANNABIMIMETIC NUTRACEUTICAL: A RANDOMIZED DOUBLE-BLIND PHASE II PILOT TRIAL   This Nurse has reviewed this patient's inclusion and exclusion criteria and confirmed Rachel Gaines is eligible for study participation.  Patient will continue with enrollment.  Research nurse met with Dr. Lindi Adie this morning, and he confirmed that the pt met all of the inclusion and exclusion criteria for study enrollment.  He signed the [place] ACCRU--2102.  The research nurse spoke to the pt this morning, and the pt confirmed that she completed her baseline study questionnaires today.  She said that she will bring the completed questionnaires with her on Friday, 9/30 when she comes to the cancer center.   Menopausal status (women only): Rachel Gaines is post menopausal with LMP (06/28/2006).   2nd nurse, Donell Sievert, will complete the 2nd nurse review.    Stratification Factors reviewed with 2nd RN reviewer and confirmed prior to registration.   Gender: female Prior chemo received:Pt received paclitaxel and Kanjinti.  Brion Aliment RN, BSN, CCRP Clinical Research Nurse Lead 03/25/2021 10:12 AM

## 2021-03-25 NOTE — Research (Signed)
ACCRU-[place]-2102 - TREATMENT OF ESTABLISHED CHEMOTHERAPY-INDUCED NEUROPATHY WITH N-PALMITOYLETHANOLAMIDE, A CANNABIMIMETIC NUTRACEUTICAL: A RANDOMIZED DOUBLE-BLIND PHASE II PILOT TRIAL  This Nurse has reviewed this patient's inclusion and exclusion criteria as a second review and confirms Nga Rabon is eligible for study participation.  Patient may continue with enrollment.  Wells Guiles 'Learta CoddingNeysa Bonito, RN, BSN Clinical Research Nurse I 03/25/21 10:29 AM

## 2021-03-27 ENCOUNTER — Encounter: Payer: Self-pay | Admitting: *Deleted

## 2021-03-27 ENCOUNTER — Inpatient Hospital Stay: Payer: 59

## 2021-03-27 ENCOUNTER — Other Ambulatory Visit: Payer: Self-pay

## 2021-03-27 VITALS — BP 145/67 | HR 77 | Temp 98.2°F | Resp 18 | Ht 64.0 in | Wt 165.8 lb

## 2021-03-27 DIAGNOSIS — C50411 Malignant neoplasm of upper-outer quadrant of right female breast: Secondary | ICD-10-CM | POA: Diagnosis not present

## 2021-03-27 DIAGNOSIS — Z5112 Encounter for antineoplastic immunotherapy: Secondary | ICD-10-CM | POA: Diagnosis not present

## 2021-03-27 DIAGNOSIS — G620 Drug-induced polyneuropathy: Secondary | ICD-10-CM | POA: Diagnosis not present

## 2021-03-27 DIAGNOSIS — R5383 Other fatigue: Secondary | ICD-10-CM | POA: Diagnosis not present

## 2021-03-27 DIAGNOSIS — Z170 Estrogen receptor positive status [ER+]: Secondary | ICD-10-CM | POA: Diagnosis not present

## 2021-03-27 DIAGNOSIS — E876 Hypokalemia: Secondary | ICD-10-CM | POA: Diagnosis not present

## 2021-03-27 DIAGNOSIS — R634 Abnormal weight loss: Secondary | ICD-10-CM | POA: Diagnosis not present

## 2021-03-27 DIAGNOSIS — R197 Diarrhea, unspecified: Secondary | ICD-10-CM | POA: Diagnosis not present

## 2021-03-27 DIAGNOSIS — T451X5D Adverse effect of antineoplastic and immunosuppressive drugs, subsequent encounter: Secondary | ICD-10-CM | POA: Diagnosis not present

## 2021-03-27 MED ORDER — SODIUM CHLORIDE 0.9% FLUSH
10.0000 mL | INTRAVENOUS | Status: DC | PRN
  Administered 2021-03-27: 10 mL

## 2021-03-27 MED ORDER — TRASTUZUMAB-ANNS CHEMO 150 MG IV SOLR
450.0000 mg | Freq: Once | INTRAVENOUS | Status: AC
  Administered 2021-03-27: 450 mg via INTRAVENOUS
  Filled 2021-03-27: qty 21.43

## 2021-03-27 MED ORDER — DIPHENHYDRAMINE HCL 25 MG PO CAPS
50.0000 mg | ORAL_CAPSULE | Freq: Once | ORAL | Status: AC
  Administered 2021-03-27: 50 mg via ORAL
  Filled 2021-03-27: qty 2

## 2021-03-27 MED ORDER — SODIUM CHLORIDE 0.9 % IV SOLN: Freq: Once | INTRAVENOUS | Status: AC

## 2021-03-27 MED ORDER — HEPARIN SOD (PORK) LOCK FLUSH 100 UNIT/ML IV SOLN
500.0000 [IU] | Freq: Once | INTRAVENOUS | Status: AC | PRN
  Administered 2021-03-27: 500 [IU]

## 2021-03-27 MED ORDER — ACETAMINOPHEN 325 MG PO TABS
650.0000 mg | ORAL_TABLET | Freq: Once | ORAL | Status: AC
  Administered 2021-03-27: 650 mg via ORAL
  Filled 2021-03-27: qty 2

## 2021-03-27 MED ORDER — INV-PALMITOYLETHANOLAMIDE/PLACEBO 400 MG CAPS ACCRU-~~LOC~~-2102: ORAL_CAPSULE | ORAL | 0 refills | Status: DC

## 2021-03-27 NOTE — Patient Instructions (Signed)
[facility] MEDICAL ONCOLOGY   Discharge Instructions: Thank you for choosing [facility] to provide your oncology and hematology care.   If you have a lab appointment with the Cancer Center, please go directly to the Cancer Center and check in at the registration area.   Wear comfortable clothing and clothing appropriate for easy access to any Portacath or PICC line.   We strive to give you quality time with your provider. You may need to reschedule your appointment if you arrive late (15 or more minutes).  Arriving late affects you and other patients whose appointments are after yours.  Also, if you miss three or more appointments without notifying the office, you may be dismissed from the clinic at the provider's discretion.      For prescription refill requests, have your pharmacy contact our office and allow 72 hours for refills to be completed.    Today you received the following chemotherapy and/or immunotherapy agents Trastuzumab-anns     To help prevent nausea and vomiting after your treatment, we encourage you to take your nausea medication as directed.  BELOW ARE SYMPTOMS THAT SHOULD BE REPORTED IMMEDIATELY: *FEVER GREATER THAN 100.4 F (38 C) OR HIGHER *CHILLS OR SWEATING *NAUSEA AND VOMITING THAT IS NOT CONTROLLED WITH YOUR NAUSEA MEDICATION *UNUSUAL SHORTNESS OF BREATH *UNUSUAL BRUISING OR BLEEDING *URINARY PROBLEMS (pain or burning when urinating, or frequent urination) *BOWEL PROBLEMS (unusual diarrhea, constipation, pain near the anus) TENDERNESS IN MOUTH AND THROAT WITH OR WITHOUT PRESENCE OF ULCERS (sore throat, sores in mouth, or a toothache) UNUSUAL RASH, SWELLING OR PAIN  UNUSUAL VAGINAL DISCHARGE OR ITCHING   Items with * indicate a potential emergency and should be followed up as soon as possible or go to the Emergency Department if any problems should occur.  Please show the CHEMOTHERAPY ALERT CARD or IMMUNOTHERAPY ALERT CARD at  check-in to the Emergency Department and triage nurse.  Should you have questions after your visit or need to cancel or reschedule your appointment, please contact [facility] MEDICAL ONCOLOGY  Dept: [phone]  and follow the prompts.  Office hours are 8:00 a.m. to 4:30 p.m. Monday - Friday. Please note that voicemails left after 4:00 p.m. may not be returned until the following business day.  We are closed weekends and major holidays. You have access to a nurse at all times for urgent questions. Please call the main number to the clinic Dept: [phone] and follow the prompts.   For any non-urgent questions, you may also contact your provider using MyChart. We now offer e-Visits for anyone 18 and older to request care online for non-urgent symptoms. For details visit [email or web address].   Also download the MyChart app! Go to the app store, search "MyChart", open the app, select [facility], and log in with your MyChart username and password.  Due to Covid, a mask is required upon entering the hospital/clinic. If you do not have a mask, one will be given to you upon arrival. For doctor visits, patients may have 1 support person aged 18 or older with them. For treatment visits, patients cannot have anyone with them due to current Covid guidelines and our immunocompromised population.   

## 2021-03-27 NOTE — Research (Signed)
ACCRU-[place]-2102 - TREATMENT OF ESTABLISHED CHEMOTHERAPY-INDUCED NEUROPATHY WITH N-PALMITOYLETHANOLAMIDE, A CANNABIMIMETIC NUTRACEUTICAL: A RANDOMIZED DOUBLE-BLIND PHASE II PILOT TRIAL   Patient arrives today (03/27/21) unaccompanied to pick up study drug and weekly questionnaires. The Rachel Gaines is in the clinic today for her scheduled Kanjinti infusion.   Questionnaires: The Rachel Gaines returned her Baseline questionnaire packet to the research nurse.  The Rachel Gaines confirmed that she completed her questionnaire packet on 03/25/21 (day of her registration).  The nurse reviewed the packet and noticed that the Rachel Gaines had circled 3 responses for # 40 on the EOR[employer] QLQ-CIPN20  questionnaire.  The nurse asked the Rachel Gaines to read the question again and mark only one response.  The Rachel Gaines marked through 2 responses (A little and Quite a Bit).  She said that "not at all" was the most accurate response.  The nurse marked error for the "marked through responses" and dated and initialed the change.  The Rachel Gaines also left blank the question "participating in hobbies or leisure activities" on the CIPN Assessment Tool.  The Rachel Gaines agreed to complete this question.  She marked a response of "9" to this question.  The rest of the Rachel Gaines's packet was accurate and complete.   Medication Review:  The Rachel Gaines was informed that she will be taking 1 capsule daily with food.  The Rachel Gaines verbalized understanding.  The Rachel Gaines was given a packet with her 8 weekly questionnaires along with 8 self-addressed envelopes.  The Rachel Gaines was told that she will be contacted weekly about her study drug and reminded to complete her questionnaires weekly and to mail them to the research department. The Rachel Gaines agreed for the nurse to call next Thursday, 10/6 at [facility] for her week 1 call.  The Rachel Gaines was dispensed her 2 pill bottles today.  Confirmed study drug bottle patient labels and pill counts with PharmD Raul Del before distributing to patient.  Two pill bottles with 30 pills in each (60 total) were provided to patient.   The Rachel Gaines was instructed on how to take her study drug.  The Rachel Gaines verbalized understanding that she takes 1 capsule daily in the morning with food.  She understands that if she takes them daily as directed, then she will take 56 pills (with 4 for return).  The Rachel Gaines was told that she will need to return any leftover pills at the end of the study.  The Rachel Gaines was specifically told to not throw away the study drug bottles.   Medication administration: The Rachel Gaines took her first dose of PEA/placebo capsule in the clinic this morning with food.  The Rachel Gaines tolerated the study drug without any reported side effects.  Baseline Adverse Events: The nurse went over the Rachel Gaines's baseline symptoms before she took her first dose of study drug. The Rachel Gaines reports a moderate headache (headache- grade 2) over the past week.  She takes Tylenol for the headache.  The Rachel Gaines attributes stress for her headache.  The Rachel Gaines denies any nausea and vomiting.  She does report mild diarrhea over the past week.  She states she has had diarrhea since 03/13/21.  The Rachel Gaines denies any other baseline symptom.   Disposition: The Rachel Gaines was thanked for her participation in the study.  The Rachel Gaines was provided direct contact information for the research nurse and Dr. Lindi Adie.  She is encouraged to contact her research nurse or Dr. Lindi Adie for any needs or questions.  Brion Aliment RN, BSN, CCRP Clinical Research Nurse Lead 03/27/2021 1:34 PM

## 2021-03-29 NOTE — Progress Notes (Signed)
  Radiation Oncology         [phone] ________________________________  Name: Rachel Gaines MRN: [id number]  Date of Service: 03/30/2021  DOB: 07-23-1955  Post Treatment Telephone Note  Diagnosis:   Stage IA, cT2 N0 M0, triple positive invasive ductal carcinoma of the right breast  Interval Since Last Radiation:  6 weeks   01/26/2021 through 02/20/2021 Site Technique Total Dose (Gy) Dose per Fx (Gy) Completed Fx Beam Energies  Breast, Right: Breast_Rt 3D 42.56/42.56 2.66 16/16 10X  Breast, Right: Breast_Rt_Bst 3D 8/8 2 4/4 10X   Narrative:  The patient was contacted today for routine follow-up. During treatment she did very well with radiotherapy and did not have significant desquamation. She reports she is doing well overall. She is having some persistent discoloration of the skin but no open irritation of it.  Impression/Plan: 1. Stage IA, cT2 N0 M0, triple positive invasive ductal carcinoma of the right breast. The patient has been doing well since completion of radiotherapy. We discussed that we would be happy to continue to follow her as needed, but she will also continue to follow up with Dr. Lindi Adie in medical oncology. She was counseled on skin care as well as measures to avoid sun exposure to this area.  2. Survivorship. We discussed the importance of survivorship evaluation and encouraged her to attend her upcoming visit with that clinic.       Carola Rhine, PAC

## 2021-03-30 ENCOUNTER — Ambulatory Visit
Admission: RE | Admit: 2021-03-30 | Discharge: 2021-03-30 | Disposition: A | Discharge status: Home / Self Care | Payer: 59 | Source: Ambulatory Visit | Attending: Radiation Oncology | Admitting: Radiation Oncology

## 2021-03-30 DIAGNOSIS — C50411 Malignant neoplasm of upper-outer quadrant of right female breast: Secondary | ICD-10-CM

## 2021-03-31 DIAGNOSIS — H40013 Open angle with borderline findings, low risk, bilateral: Secondary | ICD-10-CM | POA: Diagnosis not present

## 2021-03-31 DIAGNOSIS — E113212 Type 2 diabetes mellitus with mild nonproliferative diabetic retinopathy with macular edema, left eye: Secondary | ICD-10-CM | POA: Diagnosis not present

## 2021-03-31 DIAGNOSIS — E113291 Type 2 diabetes mellitus with mild nonproliferative diabetic retinopathy without macular edema, right eye: Secondary | ICD-10-CM | POA: Diagnosis not present

## 2021-03-31 DIAGNOSIS — Z961 Presence of intraocular lens: Secondary | ICD-10-CM | POA: Diagnosis not present

## 2021-03-31 DIAGNOSIS — Z794 Long term (current) use of insulin: Secondary | ICD-10-CM | POA: Diagnosis not present

## 2021-04-01 ENCOUNTER — Telehealth: Payer: Self-pay | Admitting: *Deleted

## 2021-04-01 ENCOUNTER — Other Ambulatory Visit (HOSPITAL_COMMUNITY): Payer: Self-pay

## 2021-04-01 NOTE — Telephone Encounter (Signed)
Connected with [facility] [phone]) regarding Disability forms for Dr. Lindi Adie and new form today for Dr. Lisbeth Renshaw for records and ability to return to work.   Connected with [employer] [facility] [phone]).  Informed them patient completed radiation 02/20/2021 with no treatment plan with as needed follow up.  Medical oncologist, Dr. Lindi Adie is the primary specialist, will complete and return requested attending physician statement.

## 2021-04-02 ENCOUNTER — Encounter: Payer: Self-pay | Admitting: *Deleted

## 2021-04-02 DIAGNOSIS — Z170 Estrogen receptor positive status [ER+]: Secondary | ICD-10-CM

## 2021-04-02 DIAGNOSIS — C50411 Malignant neoplasm of upper-outer quadrant of right female breast: Secondary | ICD-10-CM

## 2021-04-02 NOTE — Research (Signed)
ACCRU-[place]-2102 - TREATMENT OF ESTABLISHED CHEMOTHERAPY-INDUCED NEUROPATHY WITH N-PALMITOYLETHANOLAMIDE, A CANNABIMIMETIC NUTRACEUTICAL: A RANDOMIZED DOUBLE-BLIND PHASE II PILOT TRIAL   WEEK 1 PHONE CALL Patient was contacted via phone today (04/02/21) at 10:35 am to complete study week 1 phone call. Identity was confirmed using two patient identifiers. The pt states that she has been doing well.  She said that she has been experiencing some "muscle cramps" in her feet and legs.  She said that she notices them more at night.  She said that she puts her feet in cold water for relief. The nurse searched the protocol and muscle cramps is not listed as known side effect of this drug.               QUESTIONNAIRE: Patient reports that she completed her questionnaires this morning.  The pt said that she already mailed the questionnaires today.    INVESTIGATIONAL PRODUCT: Patient reports taking 7 capsules since 03/27/2021 (started taking drug that morning once daily in AM with food).  Patient stated she is always compliant with study medication. Patient is reminded on the importance of continuing study medication compliance, verbalizes understanding.   ADVERSE EVENTS: USING CTCAE V. 5.0 Per protocol (Appendix VIII) pt was asked about nausea, vomiting, and diarrhea.  Pt denies nausea, vomiting, and diarrhea. She does report  "moderate muscle cramps" in her feet/legs.  The pt said that she has experienced these types of cramps in the past.  The nurse informed Dr. Lindi Adie about the muscle cramps.  He reviewed the protocol, and he stated that he is comfortable with the pt continuing her study drug as directed.  He felt that her muscle cramps are unrelated to her study drug.  The pt was encouraged to contact Dr. Lindi Adie if her muscle cramps worsen.  The pt verbalized understanding.    Rachel Gaines [id number]  04/02/2021  Adverse Event Log  Study/Protocol: ACCRU-[place]-2102 WEEK 1 call   Event Grade Onset Date  Resolved Date Drug Name Attribution Treatment Comments  Muscle cramp Grade 2 04/02/21 Ongoing PEA/placebo Unrelated Places her feet in cold water for relief Will continue study drug      FOLLOW-UP: Patient denies concerns at this time.  Patients agrees to next follow-up phone call on 04/09/2021 around 10:30 am.     MEDICATIONS: Patient denies any changes in her medications or other treatments (per protocol, Appendix VIII).  Patient denies taking any other cannabinoid products like CBD and/or THC, was re-educated on importance of not taking them while on study.  Pt verbalizes understanding of instructions.      The patient was thanked for their time and continued voluntary participation in this study. Patient has been provided direct contact information and is encouraged to contact this Nurse for any needs or questions.  Brion Aliment RN, BSN, CCRP Clinical Research Nurse Lead 04/02/2021 11:32 AM

## 2021-04-06 ENCOUNTER — Other Ambulatory Visit (HOSPITAL_COMMUNITY): Payer: Self-pay

## 2021-04-07 ENCOUNTER — Encounter: Payer: Self-pay | Admitting: Hematology and Oncology

## 2021-04-07 ENCOUNTER — Other Ambulatory Visit (HOSPITAL_COMMUNITY): Payer: Self-pay

## 2021-04-08 ENCOUNTER — Other Ambulatory Visit (HOSPITAL_COMMUNITY): Payer: Self-pay

## 2021-04-09 ENCOUNTER — Encounter: Payer: Self-pay | Admitting: Emergency Medicine

## 2021-04-09 ENCOUNTER — Encounter: Payer: Self-pay | Admitting: *Deleted

## 2021-04-09 ENCOUNTER — Other Ambulatory Visit (HOSPITAL_COMMUNITY): Payer: Self-pay

## 2021-04-09 ENCOUNTER — Telehealth: Payer: Self-pay | Admitting: Emergency Medicine

## 2021-04-09 DIAGNOSIS — Z170 Estrogen receptor positive status [ER+]: Secondary | ICD-10-CM

## 2021-04-09 DIAGNOSIS — C50411 Malignant neoplasm of upper-outer quadrant of right female breast: Secondary | ICD-10-CM

## 2021-04-09 MED ORDER — JARDIANCE 10 MG PO TABS
10.0000 mg | ORAL_TABLET | Freq: Every day | ORAL | 3 refills | Status: DC
  Filled 2021-04-09: qty 90, 90d supply, fill #0

## 2021-04-09 NOTE — Research (Signed)
ACCRU-[place]-2102 - TREATMENT OF ESTABLISHED CHEMOTHERAPY-INDUCED NEUROPATHY WITH N-PALMITOYLETHANOLAMIDE, A CANNABIMIMETIC NUTRACEUTICAL: A RANDOMIZED DOUBLE-BLIND PHASE II PILOT TRIAL    WEEK 2 PHONE CALL  Attempted to contact this patient on behalf of Clinical Research Nurse Lexine Baton while she was unavailable.  CRN Nikki completed Week 2 note, see for details.  No further action required at this time by this RN.  Wells Guiles 'Learta CoddingNeysa Bonito, RN, BSN Clinical Research Nurse I 04/10/21 10:07 AM

## 2021-04-09 NOTE — Telephone Encounter (Signed)
ACCRU-[place]-2102 - TREATMENT OF ESTABLISHED CHEMOTHERAPY-INDUCED NEUROPATHY WITH N-PALMITOYLETHANOLAMIDE, A CANNABIMIMETIC NUTRACEUTICAL: A RANDOMIZED DOUBLE-BLIND PHASE II PILOT TRIAL  Contacted pt for her Week 2 call for trial, pt asked to be called back.  Attempted to contact pt x3 same day, no answer.  Clinical Research Nurse Lexine Baton to f/u.  Wells Guiles 'Learta CoddingNeysa Bonito, RN, BSN Clinical Research Nurse I 04/09/21 1:54 PM

## 2021-04-09 NOTE — Research (Signed)
ACCRU-[place]-2102 - TREATMENT OF ESTABLISHED CHEMOTHERAPY-INDUCED NEUROPATHY WITH N-PALMITOYLETHANOLAMIDE, A CANNABIMIMETIC NUTRACEUTICAL: A RANDOMIZED DOUBLE-BLIND PHASE II PILOT TRIAL    WEEK 2 PHONE CALL Patient was contacted via phone today (04/09/21) at 2:05p to complete study week 2 phone call. Identity was confirmed using two patient identifiers. The pt states that she has been doing well.  She said that her "muscle cramps" in her feet and legs have decreased.               QUESTIONNAIRE: Patient reports that she completed her questionnaires this morning.  The pt said that she already mailed the questionnaires today. Site received the pt's week 1 questionnaires in the mail on 04/08/21.    INVESTIGATIONAL PRODUCT: Patient reports taking 7 capsules since 04/03/2021 (started taking drug that morning once daily in AM with food).  Patient stated she is always compliant with study medication. Patient is reminded on the importance of continuing study medication compliance, verbalizes understanding.   ADVERSE EVENTS: USING CTCAE V. 5.0 Per protocol (Appendix VIII) pt was asked about nausea, vomiting, and diarrhea.  Pt denies vomiting and diarrhea. She reported nausea that affected her diet one day this week. (Grade 2).   She denies any nausea today.  She does report ongoing "muscle cramps" in her feet/legs, but she said that they are now milder (grade 1).  The pt said that she has experienced these types of cramps in the past. The pt was encouraged to contact Dr. Lindi Adie if her muscle cramps worsen or if her nausea returns.  The pt verbalized understanding.     Grey Schlauch [id number]   04/09/2021   Adverse Event Log   Study/Protocol: ACCRU-[place]-2102 WEEK 2 call   Attributions reviewed with Dr. Lindi Adie on 04/10/21- Dr. Lindi Adie felt that her nausea could "possibly" be related to her study drug. He still feels that her muscle cramps are unrelated to her study drug.  Event Grade Onset Date Resolved Date  Drug Name Attribution Treatment Comments  Muscle cramp Grade 2 04/02/21 04/09/21 PEA/placebo Unrelated Places her feet in cold water for relief Will continue study drug   Muscle cramp  Grade 1  04/09/21 Ongoing PEA/placebo  Unrelated  Pt reports AE is milder this week  Nausea  Grade 2  04/08/21 04/09/21 PEA/placebo Possible  Pt said she did not eat much  AE only lasted 1 day  FOLLOW-UP: Patient denies concerns at this time.  Patients agrees to next follow-up phone call on 04/16/2021.  Nurse will collect pt's questionnaires at her clinic visit on 04/17/21.    MEDICATIONS: Pt denies any changes to medication list, also denies any new allergies.  Patient denies taking any other cannabinoid products like CBD and/or THC, was re-educated on importance of not taking them while on study.  Pt verbalizes understanding of instructions.      The patient was thanked for their time and continued voluntary participation in this study. Patient has been provided direct contact information and is encouraged to contact this Nurse for any needs or questions  Brion Aliment RN, BSN, CCRP Clinical Research Nurse Lead 04/09/2021 2:17 PM

## 2021-04-10 ENCOUNTER — Encounter: Payer: Self-pay | Admitting: Hematology and Oncology

## 2021-04-10 DIAGNOSIS — E11319 Type 2 diabetes mellitus with unspecified diabetic retinopathy without macular edema: Secondary | ICD-10-CM | POA: Diagnosis not present

## 2021-04-10 DIAGNOSIS — E559 Vitamin D deficiency, unspecified: Secondary | ICD-10-CM | POA: Diagnosis not present

## 2021-04-10 DIAGNOSIS — E785 Hyperlipidemia, unspecified: Secondary | ICD-10-CM | POA: Diagnosis not present

## 2021-04-15 ENCOUNTER — Ambulatory Visit: Payer: 59 | Attending: Radiation Oncology

## 2021-04-15 ENCOUNTER — Other Ambulatory Visit: Payer: Self-pay

## 2021-04-15 DIAGNOSIS — Z170 Estrogen receptor positive status [ER+]: Secondary | ICD-10-CM | POA: Insufficient documentation

## 2021-04-15 DIAGNOSIS — L599 Disorder of the skin and subcutaneous tissue related to radiation, unspecified: Secondary | ICD-10-CM | POA: Insufficient documentation

## 2021-04-15 DIAGNOSIS — M25511 Pain in right shoulder: Secondary | ICD-10-CM | POA: Diagnosis not present

## 2021-04-15 DIAGNOSIS — R42 Dizziness and giddiness: Secondary | ICD-10-CM | POA: Diagnosis not present

## 2021-04-15 DIAGNOSIS — M25611 Stiffness of right shoulder, not elsewhere classified: Secondary | ICD-10-CM | POA: Insufficient documentation

## 2021-04-15 DIAGNOSIS — R293 Abnormal posture: Secondary | ICD-10-CM | POA: Insufficient documentation

## 2021-04-15 DIAGNOSIS — C50411 Malignant neoplasm of upper-outer quadrant of right female breast: Secondary | ICD-10-CM | POA: Diagnosis not present

## 2021-04-15 NOTE — Therapy (Signed)
[facility] @ [place], [place], [address] Phone: [phone]   Fax:  [phone]  Physical Therapy Treatment  Patient Details  Name: Rachel Gaines MRN: [id number] Date of Birth: 07-10-1955 Referring Provider (PT): Dr. Donne Hazel   Encounter Date: 04/15/2021   PT End of Session - 04/15/21 1447     Visit Number 9    Number of Visits 21    Date for PT Re-Evaluation 05/27/21    PT Start Time 1350    PT Stop Time 1660    PT Time Calculation (min) 57 min    Activity Tolerance Patient tolerated treatment well    Behavior During Therapy [facility] for tasks assessed/performed             Past Medical History:  Diagnosis Date   Abnormal Pap smear of cervix 2014   ASCUS with negative HR HPV    Anemia    Breast cancer ([place]) 11/2020   Cancer ([place]) 06/2020   right breast IDC with DCIS   Cataract    Diabetes mellitus without complication ([place])    Hyperlipidemia    on medicine   Plantar fasciitis    STD (sexually transmitted disease)    HSV type II    Past Surgical History:  Procedure Laterality Date   BREAST LUMPECTOMY WITH RADIOACTIVE SEED AND SENTINEL LYMPH NODE BIOPSY Right 12/18/2020   Procedure: RIGHT BREAST LUMPECTOMY WITH RADIOACTIVE SEED AND RIGHT AXILLARY SENTINEL LYMPH NODE BIOPSY;  Surgeon: Rolm Bookbinder, MD;  Location: [facility];  Service: General;  Laterality: Right;   CATARACT EXTRACTION  2019   COLONOSCOPY     COLPOSCOPY W/ BIOPSY / CURETTAGE  02/04/1999   Chronic cervicitis   EYE SURGERY     PORTACATH PLACEMENT N/A 08/26/2020   Procedure: INSERTION PORT-A-CATH;  Surgeon: Rolm Bookbinder, MD;  Location: [facility];  Service: General;  Laterality: N/A;  START TIME OF 12:30 PM FOR 60 MINUTES ROOM 8    There were no vitals filed for this visit.   Subjective Assessment - 04/15/21 1350     Subjective Arm is still sore medially.  I have kept up with my stretches at home. Still  have difficulty reaching higher up because of pulling in the arm.  The breast has been feeling sore all around and I get intermittent sharp pains in my breast. My fingertips still don't have feeling and its hard to open bottles.    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  Pt had right lumpectomy with SLNB on 12/18/2020.  Radiation ended in September.    Currently in Pain? Yes    Pain Score 8     Pain Location Breast    Pain Orientation Right    Pain Descriptors / Indicators Sharp;Sore    Pain Type Acute pain    Pain Onset More than a month ago    Pain Frequency Intermittent    Aggravating Factors  can't lay on right arm, reaching for things irritate arm    Pain Relieving Factors laying down,    Effect of Pain on Daily Activities Limits use of arm for higher things                [facility] PT Assessment - 04/15/21 0001       Assessment   Medical Diagnosis Right breast CA    Referring Provider (PT) Dr. Donne Hazel  Onset Date/Surgical Date 12/18/20    Prior Therapy 1 visit      Precautions   Precaution Comments lymphedema risk      Restrictions   Weight Bearing Restrictions No      Balance Screen   Has the patient fallen in the past 6 months No    Has the patient had a decrease in activity level because of a fear of falling?  No    Is the patient reluctant to leave their home because of a fear of falling?  No      Prior Function   Level of Independence Independent      Observation/Other Assessments   Observations incisions healed with mild scar tissue beneath, no visible breast swelling. very mild skin darkening from radiation      Posture/Postural Control   Posture/Postural Control Postural limitations    Postural Limitations Rounded Shoulders;Forward head      AROM   Right Shoulder Extension 42 Degrees    Right Shoulder Flexion 112 Degrees    Right Shoulder ABduction 134  Degrees    Right Shoulder Internal Rotation 60 Degrees    Right Shoulder External Rotation 85 Degrees      Palpation   Palpation comment Tender at pecs, lats, UT, medial upper arm and medial elbow and lateral breast               LYMPHEDEMA/ONCOLOGY QUESTIONNAIRE - 04/15/21 0001       Right Upper Extremity Lymphedema   15 cm Proximal to Olecranon Process 30 cm    Olecranon Process 24.4 cm    15 cm Proximal to Ulnar Styloid Process 22.9 cm    Just Proximal to Ulnar Styloid Process 15.6 cm    Across Hand at [employer] 19.6 cm    At [place] of 2nd Digit 6 cm                Quick Dash - 04/15/21 0001     Open a tight or new jar Severe difficulty    Do heavy household chores (wash walls, wash floors) Moderate difficulty    Carry a shopping bag or briefcase Moderate difficulty    Wash your back Moderate difficulty    Use a knife to cut food Moderate difficulty    Recreational activities in which you take some force or impact through your arm, shoulder, or hand (golf, hammering, tennis) Severe difficulty    During the past week, to what extent has your arm, shoulder or hand problem interfered with your normal social activities with family, friends, neighbors, or groups? Modererately    During the past week, to what extent has your arm, shoulder or hand problem limited your work or other regular daily activities Modererately    Arm, shoulder, or hand pain. Extreme    Tingling (pins and needles) in your arm, shoulder, or hand Extreme    Difficulty Sleeping Severe difficulty    DASH Score 65.91 %                    [facility] Adult PT Treatment/Exercise - 04/15/21 0001       Shoulder Exercises: Supine   Other Supine Exercises AA shoulder flexion x 3      Shoulder Exercises: Pulleys   Flexion 2 minutes    Scaption 1 minute    ABduction 2 minutes      Manual Therapy   Soft tissue mobilization supine to pectoralis and right lats    Passive  ROM PROM right  shoulder flex, scaption, abd, ER with VC's to relax - pt with increased R upper arm pain with all movements                          PT Long Term Goals - 04/15/21 1440       PT LONG TERM GOAL #1   Title Pt will return to baseline shoulder ROM measurements and not demonstrate any signs of lymphedema.    Time 6    Period Weeks    Status On-going    Target Date 05/27/21      PT LONG TERM GOAL #2   Title Pt will report decreased pain by 50% or greater    Time 6    Period Weeks    Status On-going    Target Date 05/27/21      PT LONG TERM GOAL #3   Title Pt will have right shoulder flexion and abd atleast 125 degrees for improved home activies    Baseline met abd 134 04/15/2021    Time 4    Period Weeks    Status On-going    Target Date 05/27/21      PT LONG TERM GOAL #4   Title Quick dash will be no greater than 18%    Baseline 66% today    Time 6    Period Weeks    Status On-going    Target Date 05/27/21      PT LONG TERM GOAL #5   Title Pt will attend ABC class for education in exercises and lymphedema    Time 6    Period Weeks    Status New    Target Date 05/27/21                   Plan - 04/15/21 1409     Clinical Impression Statement Pt has missed nearly 3 weeks of therapy secondary to limited appt. availability.  She has completed radiation and continues to have infusion.  AROM has improved for flexion and abd, but continues with significant medial arm pain and limitations in ROM.  Facial grimacing/guarding noted with ROM.  She continues with tenderness at axillary border of pecs and lats and has difficulty relaxing for soft tissue mobilization to those area.  Skin is mildly darker from radiation but there is no noted swelling. small amt of scar tissue still noted at nipple incision.  Pt is progressing towards goals, but has only partially met one goal.  She would benefit from continued skilled PT to address deficits and return to PLOF.     Personal Factors and Comorbidities Comorbidity 1    Comorbidities diabetes    Examination-Activity Limitations Lift;Reach Overhead;Sleep    Examination-Participation Restrictions Occupation    Stability/Clinical Decision Making Stable/Uncomplicated    Rehab Potential Excellent    PT Frequency 2x / week    PT Duration 6 weeks    PT Treatment/Interventions ADLs/Self Care Home Management;Therapeutic exercise;Patient/family education;Scar mobilization;Passive range of motion;Manual lymph drainage;Manual techniques;Orthotic Fit/Training;Iontophoresis 46m/ml Dexamethasone    PT Next Visit Plan see if pt can schedule 2x/wk,consider ionto if cert signed and post shoulder pain returns, SoZO around Dec 28 (set up) cont MFR techniques to cording, cont TG soft, PROM, Right breast MLD/foam pad prn    PT Home Exercise Plan post op shoulder exs, standing lat stretch    Consulted and Agree with Plan of Care Patient  Patient will benefit from skilled therapeutic intervention in order to improve the following deficits and impairments:  Postural dysfunction, Decreased knowledge of precautions, Pain, Decreased scar mobility, Decreased range of motion, Decreased strength, Impaired UE functional use  Visit Diagnosis: Stiffness of right shoulder, not elsewhere classified  Acute pain of right shoulder  Disorder of the skin and subcutaneous tissue related to radiation, unspecified  Abnormal posture  Malignant neoplasm of upper-outer quadrant of right breast in female, estrogen receptor positive ([place])     Problem List Patient Active Problem List   Diagnosis Date Noted   Diarrhea 10/24/2020   Port-A-Cath in place 09/26/2020   Hypokalemia 09/26/2020   Malignant neoplasm of upper-outer quadrant of right breast in female, estrogen receptor positive ([place]) 08/15/2020   Personal history of COVID-19 07/04/2020   Mixed hyperlipidemia 07/04/2020   Diabetes mellitus type 2 in obese ([place]) 04/28/2019    Precordial pain 04/28/2019   DOE (dyspnea on exertion) 04/28/2019   Tendonitis, Achilles, right 01/17/2018   Heel pain 09/26/2015   Plantar fasciitis of right foot 10/25/2014   Equinus deformity of foot, acquired 10/25/2014   Tarsal tunnel syndrome 10/25/2014   Deformity of metatarsal bone of right foot 10/25/2014   Genital herpes, unspecified 12/21/2012   DIABETES MELLITUS II, UNCOMPLICATED 44/46/1901   OBESITY, NOS 08/25/2006   ANEMIA, ACUTE BLOOD LOSS 08/25/2006   DEPRESSION, MAJOR, RECURRENT 08/25/2006   HEADACHE, UNSPECIFIED 08/25/2006    Claris Pong, PT 04/15/2021, 2:57 PM  [facility] @ [place], [place], [address] Phone: [phone]   Fax:  [phone]  Name: An Lannan MRN: [id number] Date of Birth: May 29, 1956

## 2021-04-16 ENCOUNTER — Encounter: Payer: Self-pay | Admitting: *Deleted

## 2021-04-17 ENCOUNTER — Inpatient Hospital Stay: Payer: 59 | Attending: Hematology and Oncology

## 2021-04-17 ENCOUNTER — Other Ambulatory Visit: Payer: Self-pay

## 2021-04-17 ENCOUNTER — Ambulatory Visit: Payer: 59 | Admitting: Hematology and Oncology

## 2021-04-17 ENCOUNTER — Inpatient Hospital Stay: Payer: 59

## 2021-04-17 ENCOUNTER — Encounter: Payer: Self-pay | Admitting: *Deleted

## 2021-04-17 ENCOUNTER — Other Ambulatory Visit (HOSPITAL_COMMUNITY): Payer: Self-pay

## 2021-04-17 ENCOUNTER — Other Ambulatory Visit: Payer: Self-pay | Admitting: *Deleted

## 2021-04-17 VITALS — BP 152/61 | HR 65 | Temp 97.8°F | Resp 18 | Wt 162.8 lb

## 2021-04-17 DIAGNOSIS — G629 Polyneuropathy, unspecified: Secondary | ICD-10-CM | POA: Diagnosis not present

## 2021-04-17 DIAGNOSIS — Z923 Personal history of irradiation: Secondary | ICD-10-CM | POA: Insufficient documentation

## 2021-04-17 DIAGNOSIS — Z1389 Encounter for screening for other disorder: Secondary | ICD-10-CM | POA: Diagnosis not present

## 2021-04-17 DIAGNOSIS — Z1331 Encounter for screening for depression: Secondary | ICD-10-CM | POA: Diagnosis not present

## 2021-04-17 DIAGNOSIS — F418 Other specified anxiety disorders: Secondary | ICD-10-CM | POA: Diagnosis not present

## 2021-04-17 DIAGNOSIS — Z801 Family history of malignant neoplasm of trachea, bronchus and lung: Secondary | ICD-10-CM | POA: Diagnosis not present

## 2021-04-17 DIAGNOSIS — E1165 Type 2 diabetes mellitus with hyperglycemia: Secondary | ICD-10-CM | POA: Diagnosis not present

## 2021-04-17 DIAGNOSIS — Z794 Long term (current) use of insulin: Secondary | ICD-10-CM | POA: Diagnosis not present

## 2021-04-17 DIAGNOSIS — Z170 Estrogen receptor positive status [ER+]: Secondary | ICD-10-CM | POA: Insufficient documentation

## 2021-04-17 DIAGNOSIS — R197 Diarrhea, unspecified: Secondary | ICD-10-CM | POA: Insufficient documentation

## 2021-04-17 DIAGNOSIS — Z95828 Presence of other vascular implants and grafts: Secondary | ICD-10-CM

## 2021-04-17 DIAGNOSIS — Z5112 Encounter for antineoplastic immunotherapy: Secondary | ICD-10-CM | POA: Diagnosis not present

## 2021-04-17 DIAGNOSIS — G620 Drug-induced polyneuropathy: Secondary | ICD-10-CM | POA: Insufficient documentation

## 2021-04-17 DIAGNOSIS — Z0000 Encounter for general adult medical examination without abnormal findings: Secondary | ICD-10-CM | POA: Diagnosis not present

## 2021-04-17 DIAGNOSIS — C50411 Malignant neoplasm of upper-outer quadrant of right female breast: Secondary | ICD-10-CM

## 2021-04-17 DIAGNOSIS — E860 Dehydration: Secondary | ICD-10-CM | POA: Diagnosis not present

## 2021-04-17 DIAGNOSIS — E785 Hyperlipidemia, unspecified: Secondary | ICD-10-CM | POA: Diagnosis not present

## 2021-04-17 DIAGNOSIS — R82998 Other abnormal findings in urine: Secondary | ICD-10-CM | POA: Diagnosis not present

## 2021-04-17 DIAGNOSIS — Z23 Encounter for immunization: Secondary | ICD-10-CM | POA: Insufficient documentation

## 2021-04-17 DIAGNOSIS — Z880 Allergy status to penicillin: Secondary | ICD-10-CM | POA: Insufficient documentation

## 2021-04-17 DIAGNOSIS — T451X5D Adverse effect of antineoplastic and immunosuppressive drugs, subsequent encounter: Secondary | ICD-10-CM | POA: Insufficient documentation

## 2021-04-17 DIAGNOSIS — R079 Chest pain, unspecified: Secondary | ICD-10-CM | POA: Diagnosis not present

## 2021-04-17 DIAGNOSIS — E559 Vitamin D deficiency, unspecified: Secondary | ICD-10-CM | POA: Diagnosis not present

## 2021-04-17 LAB — CMP (CANCER CENTER ONLY)
ALT: 17 U/L (ref 0–44)
AST: 19 U/L (ref 15–41)
Albumin: 4 g/dL (ref 3.5–5.0)
Alkaline Phosphatase: 89 U/L (ref 38–126)
Anion gap: 4 — ABNORMAL LOW (ref 5–15)
BUN: 8 mg/dL (ref 8–23)
CO2: 29 mmol/L (ref 22–32)
Calcium: 9.5 mg/dL (ref 8.9–10.3)
Chloride: 106 mmol/L (ref 98–111)
Creatinine: 0.59 mg/dL (ref 0.44–1.00)
GFR, Estimated: >60 mL/min (ref >60)
Glucose, Bld: 221 mg/dL — ABNORMAL HIGH (ref 70–99)
Potassium: 4 mmol/L (ref 3.5–5.1)
Sodium: 139 mmol/L (ref 135–145)
Total Bilirubin: 1.2 mg/dL (ref 0.3–1.2)
Total Protein: 7.5 g/dL (ref 6.5–8.1)

## 2021-04-17 LAB — CBC WITH DIFFERENTIAL (CANCER CENTER ONLY)
Abs Immature Granulocytes: 0.01 10*3/uL (ref 0.00–0.07)
Basophils Absolute: 0 10*3/uL (ref 0.0–0.1)
Basophils Relative: 1 %
Eosinophils Absolute: 0.1 10*3/uL (ref 0.0–0.5)
Eosinophils Relative: 2 %
HCT: 40.6 % (ref 36.0–46.0)
Hemoglobin: 13.7 g/dL (ref 12.0–15.0)
Immature Granulocytes: 0 %
Lymphocytes Relative: 27 %
Lymphs Abs: 1.5 10*3/uL (ref 0.7–4.0)
MCH: 26.7 pg (ref 26.0–34.0)
MCHC: 33.7 g/dL (ref 30.0–36.0)
MCV: 79.1 fL — ABNORMAL LOW (ref 80.0–100.0)
Monocytes Absolute: 0.4 10*3/uL (ref 0.1–1.0)
Monocytes Relative: 8 %
Neutro Abs: 3.4 10*3/uL (ref 1.7–7.7)
Neutrophils Relative %: 62 %
Platelet Count: 206 10*3/uL (ref 150–400)
RBC: 5.13 MIL/uL — ABNORMAL HIGH (ref 3.87–5.11)
RDW: 15.4 % (ref 11.5–15.5)
WBC Count: 5.4 10*3/uL (ref 4.0–10.5)
nRBC: 0 % (ref 0.0–0.2)

## 2021-04-17 MED ORDER — ACETAMINOPHEN 325 MG PO TABS
650.0000 mg | ORAL_TABLET | Freq: Once | ORAL | Status: AC
  Administered 2021-04-17: 650 mg via ORAL
  Filled 2021-04-17: qty 2

## 2021-04-17 MED ORDER — DIPHENHYDRAMINE HCL 25 MG PO CAPS
50.0000 mg | ORAL_CAPSULE | Freq: Once | ORAL | Status: AC
  Administered 2021-04-17: 50 mg via ORAL
  Filled 2021-04-17: qty 2

## 2021-04-17 MED ORDER — SODIUM CHLORIDE 0.9% FLUSH
10.0000 mL | INTRAVENOUS | Status: DC | PRN
  Administered 2021-04-17: 10 mL

## 2021-04-17 MED ORDER — HEPARIN SOD (PORK) LOCK FLUSH 100 UNIT/ML IV SOLN
500.0000 [IU] | Freq: Once | INTRAVENOUS | Status: AC | PRN
  Administered 2021-04-17: 500 [IU]

## 2021-04-17 MED ORDER — INFLUENZA VAC A&B SA ADJ QUAD 0.5 ML IM PRSY
0.5000 mL | PREFILLED_SYRINGE | Freq: Once | INTRAMUSCULAR | Status: AC
  Administered 2021-04-17: 0.5 mL via INTRAMUSCULAR
  Filled 2021-04-17: qty 0.5

## 2021-04-17 MED ORDER — JANUVIA 100 MG PO TABS
100.0000 mg | ORAL_TABLET | Freq: Every day | ORAL | 3 refills | Status: DC
  Filled 2021-04-17: qty 90, 90d supply, fill #0
  Filled 2021-07-28 (×2): qty 30, 30d supply, fill #1
  Filled 2021-07-28: qty 90, 90d supply, fill #1
  Filled 2021-09-01: qty 30, 30d supply, fill #2
  Filled 2021-10-09: qty 30, 30d supply, fill #3
  Filled 2021-11-20: qty 30, 30d supply, fill #4
  Filled 2022-01-12: qty 30, 30d supply, fill #5
  Filled 2022-02-11: qty 30, 30d supply, fill #6
  Filled 2022-03-25: qty 30, 30d supply, fill #7

## 2021-04-17 MED ORDER — SODIUM CHLORIDE 0.9% FLUSH
10.0000 mL | Freq: Once | INTRAVENOUS | Status: AC
  Administered 2021-04-17: 10 mL

## 2021-04-17 MED ORDER — JARDIANCE 10 MG PO TABS
10.0000 mg | ORAL_TABLET | Freq: Every day | ORAL | 3 refills | Status: DC
  Filled 2021-04-17: qty 90, 90d supply, fill #0
  Filled 2021-07-28: qty 90, 90d supply, fill #1
  Filled 2021-11-02: qty 90, 90d supply, fill #2
  Filled 2022-01-30 – 2022-02-11 (×2): qty 90, 90d supply, fill #3

## 2021-04-17 MED ORDER — LETROZOLE 2.5 MG PO TABS
2.5000 mg | ORAL_TABLET | Freq: Every day | ORAL | 3 refills | Status: DC
  Filled 2021-04-17 – 2021-07-01 (×4): qty 90, 90d supply, fill #0
  Filled 2021-10-12: qty 90, 90d supply, fill #1
  Filled 2022-01-12: qty 90, 90d supply, fill #2

## 2021-04-17 MED ORDER — TRASTUZUMAB-ANNS CHEMO 150 MG IV SOLR
450.0000 mg | Freq: Once | INTRAVENOUS | Status: AC
  Administered 2021-04-17: 450 mg via INTRAVENOUS
  Filled 2021-04-17: qty 21.43

## 2021-04-17 MED ORDER — SODIUM CHLORIDE 0.9 % IV SOLN: Freq: Once | INTRAVENOUS | Status: AC

## 2021-04-17 NOTE — Research (Addendum)
ACCRU-[place]-2102 - TREATMENT OF ESTABLISHED CHEMOTHERAPY-INDUCED NEUROPATHY WITH N-PALMITOYLETHANOLAMIDE, A CANNABIMIMETIC NUTRACEUTICAL: A RANDOMIZED DOUBLE-BLIND PHASE II PILOT TRIAL    WEEK 3 - seen in the clinic  Patient was seen in the clinic this morning for her week 3 study contact. The pt states that she has been doing well.  She said that her "muscle cramps" in her feet/legs are stable.               QUESTIONNAIRE: Patient reports that she completed her questionnaire yesterday.  The pt gave the research nurse her completed week 3 questionnaire today instead of mailing it since she had a clinic visit.  Site received the pt's week 2 questionnaires in the mail on 04/14/21.    INVESTIGATIONAL PRODUCT: Patient reports taking 6 capsules since 04/10/2021 (started taking drug that morning once daily in AM with food) through 04/16/21.  She states she missed 1 tablet this week. Patient is reminded on the importance of continuing study medication compliance, verbalizes understanding.   ADVERSE EVENTS: USING CTCAE V. 5.0 Per protocol (Appendix VIII) pt was asked about nausea, vomiting, and diarrhea.  Pt denies nausea, vomiting and diarrhea. The nurse specifically asked the pt if she had any nausea over the past week.  The pt denied.  She said that she only had 1 day of nausea on 04/07/21 that lasted 1 day and resolved.    She does report ongoing "muscle cramps" in her feet/legs, but she said that they are now milder (grade 1).  The pt said that she has experienced these types of cramps in the past. The pt was encouraged to contact Dr. Lindi Adie if her muscle cramps worsen.  The pt did not report any other adverse event.     Veyda Kaufman [id number]   04/17/2021   Adverse Event Log   Study/Protocol: ACCRU-[place]-2102 WEEK 3 clinic visit Note:  AE table reflects date of pt's grade 2 nausea reported on 04/07/21.  Date was not confirmed last week.  Event Grade Onset Date Resolved Date Drug Name Attribution  Treatment Comments  Muscle cramp  Grade 1  04/09/21 Ongoing PEA/placebo  Unrelated   Pt reports AE is ongoing   Nausea  Grade 2  04/07/21 04/08/21 PEA/placebo Possible  Pt said she did not eat much  AE only lasted 1 day  FOLLOW-UP: Patient denies concerns at this time.  Patients agrees to next follow-up phone call on 04/23/2021.   MEDICATIONS: Pt denies any changes to medication list, also denies any new allergies.  Patient denies taking any other cannabinoid products like CBD and/or THC, was re-educated on importance of not taking them while on study.  Pt verbalizes understanding of instructions.      The patient was thanked for their time and continued voluntary participation in this study. Patient has been provided direct contact information and is encouraged to contact this Nurse for any needs or questions.  Brion Aliment RN, BSN, CCRP Clinical Research Nurse Lead 04/17/2021 11:02 AM

## 2021-04-17 NOTE — Patient Instructions (Signed)
[facility] MEDICAL [facility]COL[facility]  Discharge Instructions: Thank you for choosing [facility] to provide your oncology and hematology care.   If you have a lab appointment with the Cancer Center, please go directly to the Cancer Center and check in at the registration area.   Wear comfortable clothing and clothing appropriate for easy access to any Portacath or PICC line.   We strive to give you quality time with your provider. You may need to reschedule your appointment if you arrive late (15 or more minutes).  Arriving late affects you and other patients whose appointments are after yours.  Also, if you miss three or more appointments without notifying the office, you may be dismissed from the clinic at the provider's discretion.      For prescription refill requests, have your pharmacy contact our office and allow 72 hours for refills to be completed.    Today you received the following chemotherapy and/or immunotherapy agents : Herceptin     To help prevent nausea and vomiting after your treatment, we encourage you to take your nausea medication as directed.  BELOW ARE SYMPTOMS THAT SHOULD BE REPORTED IMMEDIATELY: *FEVER GREATER THAN 100.4 F (38 C) OR HIGHER *CHILLS OR SWEATING *NAUSEA AND VOMITING THAT IS NOT CONTROLLED WITH YOUR NAUSEA MEDICATION *UNUSUAL SHORTNESS OF BREATH *UNUSUAL BRUISING OR BLEEDING *URINARY PROBLEMS (pain or burning when urinating, or frequent urination) *BOWEL PROBLEMS (unusual diarrhea, constipation, pain near the anus) TENDERNESS IN MOUTH AND THROAT WITH OR WITHOUT PRESENCE OF ULCERS (sore throat, sores in mouth, or a toothache) UNUSUAL RASH, SWELLING OR PAIN  UNUSUAL VAGINAL DISCHARGE OR ITCHING   Items with * indicate a potential emergency and should be followed up as soon as possible or go to the Emergency Department if any problems should occur.  Please show the CHEMOTHERAPY ALERT CARD or IMMUNOTHERAPY ALERT CARD at check-in to  the Emergency Department and triage nurse.  Should you have questions after your visit or need to cancel or reschedule your appointment, please contact [facility]  Dept: [phone]  and follow the prompts.  Office hours are 8:00 a.m. to 4:30 p.m. Monday - Friday. Please note that voicemails left after 4:00 p.m. may not be returned until the following business day.  We are closed weekends and major holidays. You have access to a nurse at all times for urgent questions. Please call the main number to the clinic Dept: [phone] and follow the prompts.   For any non-urgent questions, you may also contact your provider using MyChart. We now offer e-Visits for anyone 18 and older to request care online for non-urgent symptoms. For details visit [email or web address].   Also download the MyChart app! Go to the app store, search "MyChart", open the app, select [facility], and log in with your MyChart username and password.  Due to Covid, a mask is required upon entering the hospital/clinic. If you do not have a mask, one will be given to you upon arrival. For doctor visits, patients may have 1 support person aged 18 or older with them. For treatment visits, patients cannot have anyone with them due to current Covid guidelines and our immunocompromised population.   

## 2021-04-20 ENCOUNTER — Other Ambulatory Visit: Payer: Self-pay | Admitting: *Deleted

## 2021-04-20 ENCOUNTER — Other Ambulatory Visit: Payer: Self-pay

## 2021-04-20 ENCOUNTER — Inpatient Hospital Stay: Payer: 59

## 2021-04-20 ENCOUNTER — Other Ambulatory Visit (HOSPITAL_COMMUNITY): Payer: Self-pay

## 2021-04-20 ENCOUNTER — Other Ambulatory Visit: Payer: Self-pay | Admitting: Hematology and Oncology

## 2021-04-20 ENCOUNTER — Inpatient Hospital Stay (HOSPITAL_BASED_OUTPATIENT_CLINIC_OR_DEPARTMENT_OTHER): Payer: 59 | Admitting: Physician Assistant

## 2021-04-20 VITALS — BP 153/87 | HR 74 | Temp 98.2°F | Resp 18

## 2021-04-20 DIAGNOSIS — C50411 Malignant neoplasm of upper-outer quadrant of right female breast: Secondary | ICD-10-CM

## 2021-04-20 DIAGNOSIS — Z5112 Encounter for antineoplastic immunotherapy: Secondary | ICD-10-CM | POA: Diagnosis not present

## 2021-04-20 DIAGNOSIS — E860 Dehydration: Secondary | ICD-10-CM | POA: Diagnosis not present

## 2021-04-20 DIAGNOSIS — Z880 Allergy status to penicillin: Secondary | ICD-10-CM | POA: Diagnosis not present

## 2021-04-20 DIAGNOSIS — T451X5D Adverse effect of antineoplastic and immunosuppressive drugs, subsequent encounter: Secondary | ICD-10-CM | POA: Diagnosis not present

## 2021-04-20 DIAGNOSIS — Z170 Estrogen receptor positive status [ER+]: Secondary | ICD-10-CM

## 2021-04-20 DIAGNOSIS — R197 Diarrhea, unspecified: Secondary | ICD-10-CM | POA: Diagnosis not present

## 2021-04-20 DIAGNOSIS — R42 Dizziness and giddiness: Secondary | ICD-10-CM

## 2021-04-20 DIAGNOSIS — Z95828 Presence of other vascular implants and grafts: Secondary | ICD-10-CM

## 2021-04-20 DIAGNOSIS — Z23 Encounter for immunization: Secondary | ICD-10-CM | POA: Diagnosis not present

## 2021-04-20 DIAGNOSIS — G620 Drug-induced polyneuropathy: Secondary | ICD-10-CM | POA: Diagnosis not present

## 2021-04-20 LAB — MAGNESIUM: Magnesium: 1.9 mg/dL (ref 1.7–2.4)

## 2021-04-20 LAB — CMP (CANCER CENTER ONLY)
ALT: 21 U/L (ref 0–44)
AST: 26 U/L (ref 15–41)
Albumin: 4.2 g/dL (ref 3.5–5.0)
Alkaline Phosphatase: 94 U/L (ref 38–126)
Anion gap: 7 (ref 5–15)
BUN: 7 mg/dL — ABNORMAL LOW (ref 8–23)
CO2: 25 mmol/L (ref 22–32)
Calcium: 9.9 mg/dL (ref 8.9–10.3)
Chloride: 106 mmol/L (ref 98–111)
Creatinine: 0.66 mg/dL (ref 0.44–1.00)
GFR, Estimated: >60 mL/min (ref >60)
Glucose, Bld: 234 mg/dL — ABNORMAL HIGH (ref 70–99)
Potassium: 3.6 mmol/L (ref 3.5–5.1)
Sodium: 138 mmol/L (ref 135–145)
Total Bilirubin: 1.5 mg/dL — ABNORMAL HIGH (ref 0.3–1.2)
Total Protein: 7.7 g/dL (ref 6.5–8.1)

## 2021-04-20 LAB — CBC WITH DIFFERENTIAL (CANCER CENTER ONLY)
Abs Immature Granulocytes: 0.01 10*3/uL (ref 0.00–0.07)
Basophils Absolute: 0 10*3/uL (ref 0.0–0.1)
Basophils Relative: 0 %
Eosinophils Absolute: 0.1 10*3/uL (ref 0.0–0.5)
Eosinophils Relative: 1 %
HCT: 43 % (ref 36.0–46.0)
Hemoglobin: 14.4 g/dL (ref 12.0–15.0)
Immature Granulocytes: 0 %
Lymphocytes Relative: 16 %
Lymphs Abs: 1.3 10*3/uL (ref 0.7–4.0)
MCH: 26.3 pg (ref 26.0–34.0)
MCHC: 33.5 g/dL (ref 30.0–36.0)
MCV: 78.6 fL — ABNORMAL LOW (ref 80.0–100.0)
Monocytes Absolute: 0.4 10*3/uL (ref 0.1–1.0)
Monocytes Relative: 6 %
Neutro Abs: 6 10*3/uL (ref 1.7–7.7)
Neutrophils Relative %: 77 %
Platelet Count: 239 10*3/uL (ref 150–400)
RBC: 5.47 MIL/uL — ABNORMAL HIGH (ref 3.87–5.11)
RDW: 14.9 % (ref 11.5–15.5)
WBC Count: 7.8 10*3/uL (ref 4.0–10.5)
nRBC: 0 % (ref 0.0–0.2)

## 2021-04-20 MED ORDER — DIPHENOXYLATE-ATROPINE 2.5-0.025 MG PO TABS
1.0000 | ORAL_TABLET | Freq: Four times a day (QID) | ORAL | 0 refills | Status: DC | PRN
  Filled 2021-04-20: qty 30, 8d supply, fill #0

## 2021-04-20 MED ORDER — HEPARIN SOD (PORK) LOCK FLUSH 100 UNIT/ML IV SOLN
500.0000 [IU] | Freq: Once | INTRAVENOUS | Status: AC
  Administered 2021-04-20: 500 [IU]

## 2021-04-20 MED ORDER — SODIUM CHLORIDE 0.9% FLUSH
10.0000 mL | Freq: Once | INTRAVENOUS | Status: AC
  Administered 2021-04-20: 10 mL

## 2021-04-20 MED ORDER — MECLIZINE HCL 12.5 MG PO TABS
12.5000 mg | ORAL_TABLET | Freq: Three times a day (TID) | ORAL | 0 refills | Status: AC | PRN
  Filled 2021-04-20: qty 42, 14d supply, fill #0

## 2021-04-20 MED ORDER — SODIUM CHLORIDE 0.9 % IV SOLN: Freq: Once | INTRAVENOUS | Status: AC

## 2021-04-20 NOTE — Progress Notes (Signed)
Symptom Management Consult note [place]   [phone] [phone]    Patient Care Team: Shon Baton, MD as PCP - General (Internal Medicine) Buford Dresser, MD as PCP - Cardiology (Cardiology) Mauro Kaufmann, RN as Oncology Nurse Navigator Rockwell Germany, RN as Oncology Nurse Navigator    Name of the patient: Rachel Gaines  [id number]  Mar 09, 1956   Date of visit: 04/20/2021    Chief complaint/ Reason for visit- dizziness and diarrhea x 1 day  Oncology History  Malignant neoplasm of upper-outer quadrant of right breast in female, estrogen receptor positive ([place])  08/08/2020 Initial Diagnosis   Screening detected right breast mass 2.2 cm by ultrasound upper outer quadrant biopsy: Grade 2-3 IDC with DCIS ER 60%, PR 20%, Ki-67 15%, HER-2 +3+   08/15/2020 Cancer Staging   Staging form: Breast, AJCC 8th Edition - Clinical stage from 08/15/2020: Stage IB (cT2, cN0, cM0, G3, ER+, PR+, HER2+) - Signed by Nicholas Lose, MD on 08/15/2020 Stage prefix: Initial diagnosis    09/12/2020 - 11/14/2020 Neo-Adjuvant Chemotherapy   Neoadjuvant Taxol/Herceptin x 12 weeks   09/17/2020 Surgery   Right breast lumpectomy ([facility]): IDC 2.1 cm 6 SLN negative for cancer   11/21/2020 -  Adjuvant Chemotherapy   Maintenance Herceptin   01/26/2021 - 02/20/2021 Radiation Therapy   Adjuvant radiation therapy (Dr. Lisbeth Gaines)     Current Therapy:  1.  Neoadjuvant chemotherapy with Taxol and Herceptin weekly x12 followed by Herceptin maintenance. Currently on cycle 11. Last treatment was 04/17/21.   Interval history-  Rachel Gaines is a 65 year old female with above mentioned history presenting to symptom management clinic today with chief complaint dizziness and diarrhea x 1 day.  Patient states when she woke up this morning upon sitting up in bed she felt the room was spinning.  She got up to walk to the bathroom and fell to her knees.  Patient denies hitting  her head or loss of consciousness.  Patient states she was concerned her blood sugar was causing her symptoms however when she checked it it was 160.  She tried eating crackers and drinking tea without dizziness improvement.  Patient states that she had decreased p.o. intake yesterday, her last meal was at 3 PM yesterday afternoon and she only drank 2 cups of water during the day.  She states she intermittently has decreased appetite associated with her chemo, this is not new.  She had a mild headache last night on left side of her head that resolved after taking tylenol.  She has a history of headaches and states that one felt similar to those in the past. Patient admits to dizziness in the past. She reports x 1 week ago she had the same feeling of room spinning sensation although it resolved without intervention and lasted less than 24 hours. She did not seek treatment at that time.  She also reports she has had 4 episodes of diarrhea today that she describes as green in color.  Patient states her grandson may change his stomach hurting this morning however typically says that when he is trying to stay home from school, she does not believe he is ill with a GI illness.  Patient did take colestipol prior to arrival. She denies any sick contacts or known covid exposures. No recent travel or antibiotic use.    REVIEW OF SYSTEMS- Review of Systems  Constitutional:  Negative for chills, diaphoresis, fever and weight loss.  HENT:  Negative for congestion,  nosebleeds and sinus pain.   Eyes:  Negative for blurred vision, double vision, photophobia and pain.  Respiratory:  Negative for cough, hemoptysis, shortness of breath and wheezing.   Cardiovascular:  Negative for chest pain, palpitations and leg swelling.  Gastrointestinal:  Positive for diarrhea and nausea. Negative for abdominal pain, blood in stool and vomiting.  Genitourinary:  Negative for dysuria, flank pain, frequency, hematuria and urgency.   Musculoskeletal:  Negative for joint pain, myalgias and neck pain.  Skin:  Negative for rash.  Neurological:  Positive for dizziness and headaches. Negative for speech change and focal weakness.  Endo/Heme/Allergies:  Does not bruise/bleed easily.  Psychiatric/Behavioral:  Negative for depression and substance abuse. The patient is not nervous/anxious.       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     Past Surgical History:  Procedure Laterality Date   BREAST LUMPECTOMY WITH RADIOACTIVE SEED AND SENTINEL LYMPH NODE BIOPSY Right 12/18/2020   Procedure: RIGHT BREAST LUMPECTOMY WITH RADIOACTIVE SEED AND RIGHT AXILLARY SENTINEL LYMPH NODE BIOPSY;  Surgeon: Rolm Bookbinder, MD;  Location: [facility];  Service: General;  Laterality: Right;   CATARACT EXTRACTION  2019   COLONOSCOPY     COLPOSCOPY W/ BIOPSY / CURETTAGE  02/04/1999   Chronic cervicitis   EYE SURGERY     PORTACATH PLACEMENT N/A 08/26/2020   Procedure: INSERTION PORT-A-CATH;  Surgeon: Rolm Bookbinder, MD;  Location: [facility];  Service: General;  Laterality: N/A;  START TIME OF 12:30 PM FOR 60 MINUTES ROOM 8    Social History   Socioeconomic History   Marital status: Single    Spouse name: Not on file   Number of children: Not on file   Years of education: Not on file   Highest education level: Not on file  Occupational History   Not on file  Tobacco Use   Smoking status: Never   Smokeless tobacco: Never  Vaping Use   Vaping Use: Never used  Substance and Sexual Activity   Alcohol use: No    Alcohol/week: 0.0  standard drinks   Drug use: No   Sexual activity: Not Currently    Birth control/protection: Post-menopausal  Other Topics Concern   Not on file  Social History Narrative   Not on file   Social Determinants of Health   Financial Resource Strain: Not on file  Food Insecurity: Not on file  Transportation Needs: Not on file  Physical Activity: Not on file  Stress: Not on file  Social Connections: Not on file  Intimate Partner Violence: Not At Risk   Fear of Current or Ex-Partner: No   Emotionally Abused: No   Physically Abused: No   Sexually Abused: No    Family History  Problem Relation Age of Onset   Diabetes Mother    Diabetes Father    Heart disease Father    Cancer Father        Prostate   Heart failure Father    Heart disease Sister  Diabetes Sister    Diabetes Sister    Heart disease Sister    Heart failure Sister    Thyroid disease Sister    Diabetes Sister    Diabetes Sister    Diabetes Sister    Diabetes Sister    Diabetes Sister    Diabetes Brother    Cancer Brother        lung cancer   Lupus Daughter    Colon cancer Neg Hx    Colon polyps Neg Hx    Rectal cancer Neg Hx    Stomach cancer Neg Hx    Esophageal cancer Neg Hx      Current Outpatient Medications:    meclizine (ANTIVERT) 12.5 MG tablet, Take 1 tablet (12.5 mg total) by mouth 3 (three) times daily as needed for up to 14 days for dizziness., Disp: 42 tablet, Rfl: 0   albuterol (VENTOLIN HFA) 108 (90 Base) MCG/ACT inhaler, INHALE 1 TO 2 PUFFS BY MOUTH INTO THE LUNGS EVERY 4 TO 6 HOURS AS NEEDED FOR SHORTNESS OF BREATH/WHEEZING/COUGH URGENCY, Disp: 18 g, Rfl: 0   benzonatate (TESSALON) 200 MG capsule, Take 1 capsule (200 mg total) by mouth 3 (three) times daily as needed., Disp: 30 capsule, Rfl: 1   Biotin 5 MG CAPS, Take 5 mg by mouth daily., Disp: , Rfl:    Cholecalciferol (VITAMIN D3) 125 MCG (5000 UT) CAPS, Take 5,000 Units by mouth daily., Disp: , Rfl:    colestipol (COLESTID) 5 g  granules, Take 5 grams by mouth 2 (two) times daily., Disp: 500 g, Rfl: 12   Continuous Blood Gluc Sensor (FREESTYLE LIBRE 2 SENSOR) MISC, Change every 14 days to monitor blood glucose continuosly, Disp: , Rfl:    Continuous Blood Gluc Sensor (FREESTYLE LIBRE 2 SENSOR) MISC, Change every 14 days to monitor blood sugar as directed, Disp: 9 each, Rfl: 3   diclofenac Sodium (VOLTAREN) 1 % GEL, Apply 2 g topically every evening., Disp: , Rfl:    diphenoxylate-atropine (LOMOTIL) 2.5-0.025 MG tablet, Take 1 tablet by mouth 4 (four) times daily as needed for diarrhea or loose stools, Disp: 30 tablet, Rfl: 0   empagliflozin (JARDIANCE) 10 MG TABS tablet, Take 1 tablet (10 mg total) by mouth daily., Disp: 90 tablet, Rfl: 3   empagliflozin (JARDIANCE) 10 MG TABS tablet, Take 1 tablet (10 mg total) by mouth daily., Disp: 90 tablet, Rfl: 3   FREESTYLE LITE test strip, , Disp: , Rfl:    gabapentin (NEURONTIN) 300 MG capsule, Take 300 mg by mouth at bedtime., Disp: , Rfl:    gabapentin (NEURONTIN) 300 MG capsule, Take 1 capsule (300 mg total) by mouth 3 (three) times daily as directed., Disp: 270 capsule, Rfl: 3   insulin detemir (LEVEMIR) 100 UNIT/ML FlexPen, INJECT 34 UNITS SUBCUTANEOUSLY TWICE DAILY, Disp: 15 mL, Rfl: 3   Insulin Glargine (BASAGLAR KWIKPEN) 100 UNIT/ML, INJECT 34 UNITS UNDER THE SKIN TWICE DAILY (Patient not taking: Reported on 12/05/2020), Disp: 45 mL, Rfl: 3   insulin glargine-yfgn (SEMGLEE, YFGN,) 100 UNIT/ML Pen, Inject 34 Units into the skin 2 (two) times daily., Disp: 15 mL, Rfl: 3   insulin lispro (HUMALOG) 100 UNIT/ML KwikPen, Inject 14 Units into the skin 3 (three) times daily with meals., Disp: 45 mL, Rfl: 3   Investigational palmitoylethanolamide/placebo 400 MG capsule ACCRU-[place]-2102, Take 1 capsule (400 mg) daily for 8 weeks. Take with food and swallow whole. Do not crush or open capsule. Store at room temperature. Keep container tightly closed., Disp: 60  capsule, Rfl: 0   letrozole  (FEMARA) 2.5 MG tablet, Take 1 tablet (2.5 mg total) by mouth daily., Disp: 90 tablet, Rfl: 3   lidocaine-prilocaine (EMLA) cream, APPLY 1 APPLICATION TOPICALLY AS NEEDED. (Patient taking differently: Apply 1 application topically daily as needed (port access).), Disp: 30 g, Rfl: 0   NONFORMULARY OR COMPOUNDED ITEM, [facility]:  Antiinflammatory cream - Diclofenac 3%, Baclofen 2%, Cyclobenzaprine 2%, Lidocaine 2%, dispense 120grams, apply 1-2 grams to affected area 3-4 times a day, +2refills. (Patient taking differently: [facility]:  Antiinflammatory cream - Diclofenac 3%, Baclofen 2%, Cyclobenzaprine 2%, Lidocaine 2%, dispense 120grams, apply 1-2 grams to affected area 3-4 times a day, +2refills.), Disp: 120 each, Rfl: 2   Pancrelipase, Lip-Prot-Amyl, (ZENPEP) 40000-126000 units CPEP, Take 1 capsule by mouth in the morning and at bedtime. (Patient taking differently: Take 1 capsule by mouth 3 (three) times daily with meals.), Disp: 120 capsule, Rfl:    ramipril (ALTACE) 2.5 MG capsule, TAKE 1 CAPSULE BY MOUTH EVERY OTHER DAY (Patient taking differently: Take 2.5 mg by mouth every other day.), Disp: 45 capsule, Rfl: 3   rosuvastatin (CRESTOR) 40 MG tablet, TAKE 1 TABLET BY MOUTH ONCE DAILY (Patient taking differently: Take 40 mg by mouth every evening.), Disp: 90 tablet, Rfl: 2   sitaGLIPtin (JANUVIA) 100 MG tablet, Take 1 tablet (100 mg total) by mouth daily., Disp: 90 tablet, Rfl: 3   UNIFINE PENTIPS 31G X 8 MM MISC, USE WITH RX OF LEVEMIR AND HUMALOG FOUR TIMES DAILY., Disp: , Rfl:    UNIFINE PENTIPS 32G X 4 MM MISC, USE WITH LEVEMIR AND HUMALOG 4X A DAY DX-E11.65, Disp: , Rfl:    valACYclovir (VALTREX) 500 MG tablet, Take 1 tablet (500 mg total) by mouth 2 (two) times daily. Take for 3 days for outbreak, Disp: 30 tablet, Rfl: 2  PHYSICAL EXAM: ECOG FS:1 - Symptomatic but completely ambulatory    Vitals:   04/20/21 1312  BP: (!) 153/87  Pulse: 74  Resp: 18  Temp: 98.2 F (36.8  C)  TempSrc: Oral  SpO2: 97%   Physical Exam Vitals and nursing note reviewed.  Constitutional:      General: She is not in acute distress.    Appearance: She is not ill-appearing.  HENT:     Head: Normocephalic and atraumatic.     Comments: No tenderness to palpation of skull. No deformities or crepitus noted. No open wounds, abrasions or lacerations.     Right Ear: Tympanic membrane and external ear normal.     Left Ear: Tympanic membrane and external ear normal.     Nose: Nose normal.     Mouth/Throat:     Mouth: Mucous membranes are dry.     Pharynx: Oropharynx is clear. No posterior oropharyngeal erythema.  Eyes:     General: No scleral icterus.       Right eye: No discharge.        Left eye: No discharge.     Extraocular Movements: Extraocular movements intact.     Conjunctiva/sclera: Conjunctivae normal.     Pupils: Pupils are equal, round, and reactive to light.     Comments: No nystagmus  Neck:     Vascular: No JVD.     Comments: Full ROM intact without spinous process TTP. No bony stepoffs or deformities, no paraspinous muscle TTP or muscle spasms. No rigidity or meningeal signs. No bruising, erythema, or swelling.   Cardiovascular:     Rate and Rhythm: Normal rate and regular  rhythm.     Pulses: Normal pulses.          Radial pulses are 2+ on the right side and 2+ on the left side.     Heart sounds: Normal heart sounds.  Pulmonary:     Comments: Lungs clear to auscultation in all fields. Symmetric chest rise. No wheezing, rales, or rhonchi. Chest:     Comments: Port in left upper chest without signs of infection. Non tender to palpation. Abdominal:     General: Bowel sounds are normal.     Palpations: Abdomen is soft.     Comments: Abdomen is soft, non-distended, and non-tender in all quadrants. No rigidity, no guarding. No peritoneal signs.  Musculoskeletal:        General: Normal range of motion.     Cervical back: Normal range of motion.  Skin:     General: Skin is warm and dry.     Capillary Refill: Capillary refill takes less than 2 seconds.     Findings: No rash.  Neurological:     Mental Status: She is oriented to person, place, and time.     GCS: GCS eye subscore is 4. GCS verbal subscore is 5. GCS motor subscore is 6.     Comments: Speech is clear and goal oriented, follows commands CN III-XII intact, no facial droop Normal strength in upper and lower extremities bilaterally including dorsiflexion and plantar flexion, strong and equal grip strength Sensation normal to light and sharp touch Moves extremities without ataxia, coordination intact Normal finger to nose and rapid alternating movements Normal gait and balance  Psychiatric:        Behavior: Behavior normal.       LABORATORY DATA: I have reviewed the data as listed CBC Latest Ref Rng & Units 04/20/2021 04/17/2021 03/06/2021  WBC 4.0 - 10.5 K/uL 7.8 5.4 5.0  Hemoglobin 12.0 - 15.0 g/dL 14.4 13.7 14.1  Hematocrit 36.0 - 46.0 % 43.0 40.6 42.3  Platelets 150 - 400 K/uL 239 206 188     CMP Latest Ref Rng & Units 04/20/2021 04/17/2021 03/06/2021  Glucose 70 - 99 mg/dL 234(H) 221(H) 98  BUN 8 - 23 mg/dL 7(L) 8 9  Creatinine 0.44 - 1.00 mg/dL 0.66 0.59 0.74  Sodium 135 - 145 mmol/L 138 139 145  Potassium 3.5 - 5.1 mmol/L 3.6 4.0 4.0  Chloride 98 - 111 mmol/L 106 106 109  CO2 22 - 32 mmol/L '25 29 26  ' Calcium 8.9 - 10.3 mg/dL 9.9 9.5 10.0  Total Protein 6.5 - 8.1 g/dL 7.7 7.5 7.4  Total Bilirubin 0.3 - 1.2 mg/dL 1.5(H) 1.2 1.2  Alkaline Phos 38 - 126 U/L 94 89 84  AST 15 - 41 U/L '26 19 19  ' ALT 0 - 44 U/L '21 17 14       ' RADIOGRAPHIC STUDIES: I have personally reviewed the radiological images as listed and agreed with the findings in the report. No images are attached to the encounter. No results found.   ASSESSMENT & PLAN: Patient is a 65 y.o. female with past medical history of malignant neoplasm of upper outer quadrant of right breast in female, estrogen  receptor positive on neoadjuvant chemotherapy with Taxol and Herceptin s/p breast conserving surgery with sentinel lymph node surgery.   1) Dizziness- Normal neuro exam, no focal weakness. Ambulatory with normal gait. Does appear dehydrated with dry mucus membranes. DDx includes dehydration 2/2 increased diarrhea today and decreased PO intake, anemia, chemo treatment related, GI illness, less  likely to be CVA with normal neuro exam.  Labs today show CBC without leukocytosis. Hemoglobin, platelets, ANC normal. Glucose elevated at 234. CMP and magnesium are unremarkable, no signs of DKA. Patient given liter of IVF and dizziness only slightly improved. She is still able to ambulate without difficulty, continues to have steady gait. Discussed with oncologist Dr. Lindi Adie who agrees with plan for MR brain to r/o mets as cause of dizziness. Patient given referral for vestibular PT as well as course of low dose meclizine for prn use. MR has been ordered, pre authorization requested.   2) Malignant neoplasm of upper outer quadrant of right breast in female- continue treatment per primary oncologist. Patient has upcoming appointment scheduled for lymphedema treatment on 04/22/21 and next chemo infusion is 05/08/21.      Visit Diagnosis: 1. Nonspecific dizziness   2. Port-A-Cath in place   3. Malignant neoplasm of upper-outer quadrant of right breast in female, estrogen receptor positive ([place])      Orders Placed This Encounter  Procedures   MR Brain W Wo Contrast    Standing Status:   Future    Standing Expiration Date:   04/20/2022    Order Specific Question:   If indicated for the ordered procedure, I authorize the administration of contrast media per Radiology protocol    Answer:   Yes    Order Specific Question:   What is the patient's sedation requirement?    Answer:   No Sedation    Order Specific Question:   Does the patient have a pacemaker or implanted devices?    Answer:   No    Order  Specific Question:   Use SRS Protocol?    Answer:   No    Order Specific Question:   Preferred imaging location?    Answer:   [facility] (table limit - 550 lbs)   PT vestibular rehab    Dizziness with possible BPH    Standing Status:   Future    Standing Expiration Date:   04/20/2022     All questions were answered. The patient knows to call the clinic with any problems, questions or concerns. No barriers to learning was detected.  I spent 30 minutes counseling the patient face to face. The total time spent in the appointment was 30 minutes and more than 50% was on counseling and review of test results  Thank you for allowing me to participate in the care of this patient.    Barrie Folk, PA-C 04/20/2021 4:20 PM

## 2021-04-20 NOTE — Progress Notes (Signed)
Received call from pt with complaint of severe dizziness x1 day.  Pt states the entire room is spinning and causing her to become very nauseated.  Per MD pt needing to be seen in [facility] for further evaluation and tx.  Appt scheduled and pt verbalized understanding of date and time.

## 2021-04-20 NOTE — Patient Instructions (Signed)

## 2021-04-21 ENCOUNTER — Ambulatory Visit: Payer: 59 | Admitting: Rehabilitation

## 2021-04-21 ENCOUNTER — Encounter: Payer: Self-pay | Admitting: Rehabilitation

## 2021-04-21 ENCOUNTER — Ambulatory Visit (HOSPITAL_COMMUNITY)
Admission: RE | Admit: 2021-04-21 | Discharge: 2021-04-21 | Disposition: A | Discharge status: Home / Self Care | Payer: 59 | Source: Ambulatory Visit | Attending: Physician Assistant | Admitting: Physician Assistant

## 2021-04-21 ENCOUNTER — Other Ambulatory Visit: Payer: Self-pay | Admitting: *Deleted

## 2021-04-21 DIAGNOSIS — R42 Dizziness and giddiness: Secondary | ICD-10-CM

## 2021-04-21 DIAGNOSIS — Q283 Other malformations of cerebral vessels: Secondary | ICD-10-CM | POA: Diagnosis not present

## 2021-04-21 MED ORDER — GADOBUTROL 1 MMOL/ML IV SOLN
8.0000 mL | Freq: Once | INTRAVENOUS | Status: AC | PRN
  Administered 2021-04-21: 8 mL via INTRAVENOUS

## 2021-04-21 NOTE — Therapy (Signed)
[facility] @ [address] [place], [place], [address] Phone: [phone]   Fax:  [phone]  Physical Therapy Evaluation  Patient Details  Name: Rachel Gaines MRN: [id number] Date of Birth: [birth year] Referring Provider (PT): Dr. Lindi Adie   Encounter Date: 04/21/2021   PT End of Session - 04/21/21 1449     Visit Number 10    Number of Visits 21    Date for PT Re-Evaluation 05/27/21    PT Start Time 4193    PT Stop Time 7902    PT Time Calculation (min) 44 min    Activity Tolerance Patient tolerated treatment well    Behavior During Therapy [facility] for tasks assessed/performed             Past Medical History:  Diagnosis Date   Abnormal Pap smear of cervix 2014   ASCUS with negative HR HPV    Anemia    Breast cancer ([place]) 11/2020   Cancer ([place]) 06/2020   right breast IDC with DCIS   Cataract    Diabetes mellitus without complication ([place])    Hyperlipidemia    on medicine   Plantar fasciitis    STD (sexually transmitted disease)    HSV type II    Past Surgical History:  Procedure Laterality Date   BREAST LUMPECTOMY WITH RADIOACTIVE SEED AND SENTINEL LYMPH NODE BIOPSY Right 12/18/2020   Procedure: RIGHT BREAST LUMPECTOMY WITH RADIOACTIVE SEED AND RIGHT AXILLARY SENTINEL LYMPH NODE BIOPSY;  Surgeon: Rolm Bookbinder, MD;  Location: [facility];  Service: General;  Laterality: Right;   CATARACT EXTRACTION  2019   COLONOSCOPY     COLPOSCOPY W/ BIOPSY / CURETTAGE  02/04/1999   Chronic cervicitis   EYE SURGERY     PORTACATH PLACEMENT N/A 08/26/2020   Procedure: INSERTION PORT-A-CATH;  Surgeon: Rolm Bookbinder, MD;  Location: [facility];  Service: General;  Laterality: N/A;  START TIME OF 12:30 PM FOR 60 MINUTES ROOM 8    There were no vitals filed for this visit.    Subjective Assessment - 04/21/21 1407     Subjective This just started Monday when I woke up.  The room started spinning when I went to  go to sit.  The meclizine helps but it comes back.  A little nausea.    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  Pt had right lumpectomy with SLNB on 12/18/2020.  Radiation ended in September.                [facility] PT Assessment - 04/21/21 0001       Assessment   Medical Diagnosis vertigo    Referring Provider (PT) Dr. Lindi Adie    Onset Date/Surgical Date 04/19/21    Hand Dominance Right      Balance Screen   Has the patient fallen in the past 6 months Yes    How many times? [place];Other relatives      Prior Function   Level of Independence Independent                    Vestibular Assessment - 04/21/21 0001       Symptom Behavior   Subjective history of current problem room spinning started 2 days ago    Type of Dizziness  Spinning;Vertigo  Frequency of Dizziness when rolling over, sit to stand    Duration of Dizziness reports a few minutes    Symptom Nature Positional;Variable    Aggravating Factors Mornings;Sit to stand;Rolling to right    Relieving Factors Rest    Progression of Symptoms Better    History of similar episodes no      Oculomotor Exam   Oculomotor Alignment Normal    Spontaneous Absent    Gaze-induced  Absent    Head shaking Horizontal Absent    Head Shaking Vertical Absent    Smooth Pursuits Saccades   1 correction noted on the first horizontal tracking but improved with cueing     Positional Testing   Dix-Hallpike Dix-Hallpike Right;Dix-Hallpike Left   both negative               Objective measurements completed on examination: See above findings.        Vestibular Treatment/Exercise - 04/21/21 0001       Vestibular Treatment/Exercise   Vestibular Treatment Provided Canalith Repositioning    Canalith Repositioning Comment       OTHER   Comment taught pt self brandt daroff exercise for at home as pt has s/s of positional vertigo but not reproduced today.  Pt has already taken meclizine x2 which may be helping                    PT Education - 04/21/21 1449     Education Details BPPV, causes of dizziness, BD exercise    Person(s) Educated Patient;Child(ren)    Methods Explanation;Demonstration;Tactile cues;Verbal cues;Handout    Comprehension Verbalized understanding                 PT Long Term Goals - 04/21/21 1454       Additional Long Term Goals   Additional Long Term Goals Yes      PT LONG TERM GOAL #6   Title Pt will report no room spinning with positon changes    Time 6    Period Weeks    Status New      PT LONG TERM GOAL #7   Title pt will be ind with self exercises including brand daroff and and VOR as needed    Time 6    Period Weeks    Status New                    Plan - 04/21/21 1450     Clinical Impression Statement Pt had an onset of vertigo/room spinning upon waking up yesterday and also reports room spinning with transition from sit to stand x 1 and when rolling supine to Rt side x 1 which made her sleep upright last night.  Pt is taking meclizine already which may have limiting findings today, but positional testing was negative during the [place] bilaterally.  positional tolerance was not tested as well as VOR today.  Pt was given brandt daroff ex to attempt at home and when this PT sees her again Friday she may return without meclizine.  Pt wlil also note any more room spinning and what she was doing.    Stability/Clinical Decision Making Evolving/Moderate complexity    Clinical Decision Making Moderate    Rehab Potential Good    PT Frequency 1x / week    PT Duration 6 weeks    PT Treatment/Interventions ADLs/Self Care Home Management;Therapeutic exercise;Patient/family education;Scar mobilization;Passive range of motion;Manual lymph drainage;Manual  techniques;Orthotic Fit/Training;Iontophoresis 4mg /ml Dexamethasone  PT Next Visit Plan see if pt can schedule 2x/wk,consider ionto if cert signed and post shoulder pain returns, SoZO around Dec 28 (set up) do in therapy, cont MFR techniques to cording, cont TG soft, PROM, Right breast MLD/foam pad prn  (vestibular - how has dizziness been? try VOR testing, positional intolerance testing?)    Consulted and Agree with Plan of Care Patient;Family member/caregiver             Patient will benefit from skilled therapeutic intervention in order to improve the following deficits and impairments:  Postural dysfunction, Decreased knowledge of precautions, Pain, Decreased scar mobility, Decreased range of motion, Decreased strength, Impaired UE functional use, Dizziness  Visit Diagnosis: Dizziness and giddiness     Problem List Patient Active Problem List   Diagnosis Date Noted   Diarrhea 10/24/2020   Port-A-Cath in place 09/26/2020   Hypokalemia 09/26/2020   Malignant neoplasm of upper-outer quadrant of right breast in female, estrogen receptor positive ([place]) 08/15/2020   Personal history of COVID-19 07/04/2020   Mixed hyperlipidemia 07/04/2020   Diabetes mellitus type 2 in obese ([place]) 04/28/2019   Precordial pain 04/28/2019   DOE (dyspnea on exertion) 04/28/2019   Tendonitis, Achilles, right 01/17/2018   Heel pain 09/26/2015   Plantar fasciitis of right foot 10/25/2014   Equinus deformity of foot, acquired 10/25/2014   Tarsal tunnel syndrome 10/25/2014   Deformity of metatarsal bone of right foot 10/25/2014   Genital herpes, unspecified 12/21/2012   DIABETES MELLITUS II, UNCOMPLICATED 81/06/7508   OBESITY, NOS 08/25/2006   ANEMIA, ACUTE BLOOD LOSS 08/25/2006   DEPRESSION, MAJOR, RECURRENT 08/25/2006   HEADACHE, UNSPECIFIED 08/25/2006    Stark Bray, PT 04/21/2021, 2:55 PM  [facility] @ [address] [place],  [place], [address] Phone: [phone]   Fax:  [phone]  Name: Rachel Gaines MRN: [id number] Date of Birth: 12/31/1955

## 2021-04-21 NOTE — Progress Notes (Signed)
Received call from pt with continued complaint of severe vertigo not alleviated with Meclozine.  Per MD pt needing referral to PT for vestibular evaluation. Referral placed, appt for today scheduled and pt notified and verbalized understanding of date and time.

## 2021-04-22 ENCOUNTER — Telehealth (HOSPITAL_BASED_OUTPATIENT_CLINIC_OR_DEPARTMENT_OTHER): Payer: Self-pay | Admitting: Physician Assistant

## 2021-04-22 DIAGNOSIS — C50411 Malignant neoplasm of upper-outer quadrant of right female breast: Secondary | ICD-10-CM

## 2021-04-22 DIAGNOSIS — Z170 Estrogen receptor positive status [ER+]: Secondary | ICD-10-CM

## 2021-04-22 NOTE — Telephone Encounter (Signed)
I called patient to update her on MRI results.  MRI showed no signs of brain metastasis, no acute infarction or hemorrhage.  Patient admits to still feeling symptomatic, she did see vestibular therapy yesterday and is scheduled to see them again on Friday in 2 days.  Encourage patient to keep this appointment and discussed ED precautions should symptoms worsen.

## 2021-04-23 ENCOUNTER — Encounter: Payer: Self-pay | Admitting: *Deleted

## 2021-04-23 DIAGNOSIS — Z170 Estrogen receptor positive status [ER+]: Secondary | ICD-10-CM

## 2021-04-23 NOTE — Research (Signed)
ACCRU-[place]-2102 - TREATMENT OF ESTABLISHED CHEMOTHERAPY-INDUCED NEUROPATHY WITH N-PALMITOYLETHANOLAMIDE, A CANNABIMIMETIC NUTRACEUTICAL: A RANDOMIZED DOUBLE-BLIND PHASE II PILOT TRIAL    WEEK 4 - phone call  Patient was called this afternoon.  Identity was confirmed with the pt using 2 patient identifiers.  The pt said that she has not felt well this week due to "vertigo".  The pt was seen and evaluated for her dizziness in the [facility] symptom management clinic on 04/20/21.  The pt had a MRI brain on 04/21/21 which was negative for metastatic disease.  The pt has started physical therapy for vestibular treatment.    QUESTIONNAIRE: Patient reports that she completed her questionnaire today.  She said that she will mail her questionnaire tomorrow.  The pt was thanked for completing her questionnaire in a timely manner.    INVESTIGATIONAL PRODUCT: Patient reports taking 5 capsules since 04/17/2021 (started taking drug that morning once daily in AM with food) through 04/23/21.  She states she missed 2 capsules this week. She said that she did not take her study drug on Monday (04/20/21) and Tuesday (04/21/21).  Patient is reminded on the importance of continuing study medication compliance, verbalizes understanding.   ADVERSE EVENTS: USING CTCAE V. 5.0 Per protocol (Appendix VIII) pt was asked about nausea, vomiting, and diarrhea.  Pt denies vomiting.  She reported diarrhea (4 stools) on 04/20/21. (Grade 2).  She said her diarrhea has resolved today. She also developed nausea (grade 2) on 04/20/21 when her dizziness started.  She said her nausea resolved on 04/22/21. She said that she did not have much of an appetite due to her dizziness.   Pt complained of a headache on 04/20/21. She denies any headache today. She does report ongoing "muscle cramps" in her feet/legs.  The pt said her dizziness started on 04/20/21 and is still ongoing.  Dr. Lindi Adie reviewed the pt's records and the protocol, and he felt that her  dizziness was unrelated to her study drug. He felt the pt should continue with her study drug without modification. The pt was encouraged to contact Dr. Lindi Adie if her dizziness worsens.  The pt did report falling to her knees on Monday because of her dizziness (fall-grade 1).  She denies any injury related to her fall.      Brytnee Bechler [id number]   04/23/2021   Adverse Event Log   Study/Protocol: ACCRU-[place]-2102 WEEK 4 AE's reviewed with Dr. Lindi Adie on 04/24/21, and MD provided attributions for the listed AE's below.  Event Grade Onset Date Resolved Date Drug Name Attribution Treatment Comments  Muscle cramp  Grade 1  04/09/21 Ongoing PEA/placebo  Unrelated     Nausea  Grade 2  04/20/21 04/22/21 PEA/placebo Unrelated  Occurred with dizziness   Dizziness Grade 3  04/20/21 Ongoing PEA/placebo Unrelated PT sessions And  Meclizine   Headache Grade 1 04/20/21 04/23/21 PEA/placebo  Unrelated  Occurred with dizziness  Fall  Grade 1  04/20/21 04/20/21 PEA/placebo  Unrelated   Reports no injury  Diarrhea  Grade 2 04/20/21 04/23/21 PEA/placebo Unrelated     FOLLOW-UP: Patient denies concerns at this time.  Patients agrees to next follow-up phone call on 04/30/2021.   MEDICATIONS: Pt reports starting meclizine 12.5mg  on 04/20/21 for relief of her dizziness. She denies any other new medications. She denies any new allergies.  Patient denies taking any other cannabinoid products like CBD and/or THC, was re-educated on importance of not taking them while on study.  Pt verbalizes understanding of instructions.  The patient was thanked for their time and continued voluntary participation in this study. Patient has been provided direct contact information and is encouraged to contact this Nurse for any needs or questions.  Brion Aliment RN, BSN, CCRP Clinical Research Nurse Lead 04/23/2021 4:59 PM

## 2021-04-24 ENCOUNTER — Ambulatory Visit: Payer: 59 | Admitting: Rehabilitation

## 2021-04-24 ENCOUNTER — Encounter: Payer: Self-pay | Admitting: Hematology and Oncology

## 2021-04-29 ENCOUNTER — Other Ambulatory Visit: Payer: Self-pay

## 2021-04-29 ENCOUNTER — Ambulatory Visit: Payer: 59 | Attending: Hematology and Oncology | Admitting: Rehabilitation

## 2021-04-29 ENCOUNTER — Encounter: Payer: Self-pay | Admitting: Rehabilitation

## 2021-04-29 DIAGNOSIS — R293 Abnormal posture: Secondary | ICD-10-CM | POA: Diagnosis not present

## 2021-04-29 DIAGNOSIS — C50411 Malignant neoplasm of upper-outer quadrant of right female breast: Secondary | ICD-10-CM | POA: Insufficient documentation

## 2021-04-29 DIAGNOSIS — M25611 Stiffness of right shoulder, not elsewhere classified: Secondary | ICD-10-CM | POA: Insufficient documentation

## 2021-04-29 DIAGNOSIS — Z170 Estrogen receptor positive status [ER+]: Secondary | ICD-10-CM | POA: Insufficient documentation

## 2021-04-29 DIAGNOSIS — R42 Dizziness and giddiness: Secondary | ICD-10-CM | POA: Insufficient documentation

## 2021-04-29 DIAGNOSIS — M25511 Pain in right shoulder: Secondary | ICD-10-CM | POA: Diagnosis not present

## 2021-04-29 DIAGNOSIS — L599 Disorder of the skin and subcutaneous tissue related to radiation, unspecified: Secondary | ICD-10-CM | POA: Insufficient documentation

## 2021-04-29 NOTE — Therapy (Signed)
[facility] @ [address] [place], [place], [address] Phone: [phone]   Fax:  [phone]  Physical Therapy Treatment  Patient Details  Name: Rachel Gaines MRN: [id number] Date of Birth: October 05, 1955 Referring Provider (PT): Dr. Lindi Adie   Encounter Date: 04/29/2021   PT End of Session - 04/29/21 1434     Visit Number 11    Number of Visits 21    Date for PT Re-Evaluation 05/27/21    PT Start Time 7989    PT Stop Time 1431    PT Time Calculation (min) 28 min    Activity Tolerance Patient tolerated treatment well    Behavior During Therapy [facility] for tasks assessed/performed             Past Medical History:  Diagnosis Date   Abnormal Pap smear of cervix 2014   ASCUS with negative HR HPV    Anemia    Breast cancer ([place]) 11/2020   Cancer ([place]) 06/2020   right breast IDC with DCIS   Cataract    Diabetes mellitus without complication ([place])    Hyperlipidemia    on medicine   Plantar fasciitis    STD (sexually transmitted disease)    HSV type II    Past Surgical History:  Procedure Laterality Date   BREAST LUMPECTOMY WITH RADIOACTIVE SEED AND SENTINEL LYMPH NODE BIOPSY Right 12/18/2020   Procedure: RIGHT BREAST LUMPECTOMY WITH RADIOACTIVE SEED AND RIGHT AXILLARY SENTINEL LYMPH NODE BIOPSY;  Surgeon: Rolm Bookbinder, MD;  Location: [facility];  Service: General;  Laterality: Right;   CATARACT EXTRACTION  2019   COLONOSCOPY     COLPOSCOPY W/ BIOPSY / CURETTAGE  02/04/1999   Chronic cervicitis   EYE SURGERY     PORTACATH PLACEMENT N/A 08/26/2020   Procedure: INSERTION PORT-A-CATH;  Surgeon: Rolm Bookbinder, MD;  Location: [facility];  Service: General;  Laterality: N/A;  START TIME OF 12:30 PM FOR 60 MINUTES ROOM 8    There were no vitals filed for this visit.   Subjective Assessment - 04/29/21 1405     Subjective It is still happening every day.  When I turn in bed to both sides and go from sit to  stand but not every time for that. 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  Pt had right lumpectomy with SLNB on 12/18/2020.  Radiation ended in September.    Currently in Pain? No/denies                     Vestibular Assessment - 04/29/21 0001       Dix-Hallpike Right   Dix-Hallpike Right Duration 10seconds    Dix-Hallpike Right Symptoms Other (comment)   pt had a hard time keeping eyes open but nystagmus was noted today the whole time of vertigo                      Vestibular Treatment/Exercise - 04/29/21 0001       Vestibular Treatment/Exercise   Canalith Repositioning Epley Manuever Right       EPLEY MANUEVER RIGHT   Number of Reps  1    Response Details  mild vertigo upon supine to sit but lasting only 4 seconds      OTHER   Comment pt will remain upright until bedtime tonight and avoid forward bending  PT Long Term Goals - 04/21/21 1454       Additional Long Term Goals   Additional Long Term Goals Yes      PT LONG TERM GOAL #6   Title Pt will report no room spinning with positon changes    Time 6    Period Weeks    Status New      PT LONG TERM GOAL #7   Title pt will be ind with self exercises including brand daroff and and VOR as needed    Time 6    Period Weeks    Status New                   Plan - 04/29/21 1435     Clinical Impression Statement DHM was positive today to the Rt.  CRM performed x 1 with vertigo lasting around 10 seconds.  Pt was educated on remaining upright until bedtime tonight and then to note if symptoms are any improved.    PT Frequency 1x / week    PT Duration 6 weeks    PT Treatment/Interventions ADLs/Self Care Home Management;Therapeutic exercise;Patient/family education;Scar mobilization;Passive range of motion;Manual lymph drainage;Manual  techniques;Orthotic Fit/Training;Iontophoresis 4mg /ml Dexamethasone    PT Next Visit Plan see if pt can schedule 2x/wk,consider ionto if cert signed and post shoulder pain returns, SoZO around Dec 28 (set up) do in therapy, cont MFR techniques to cording, cont TG soft, PROM, Right breast MLD/foam pad prn  (vestibular - how has dizziness been? repeat DHM and CRM             Patient will benefit from skilled therapeutic intervention in order to improve the following deficits and impairments:     Visit Diagnosis: Dizziness and giddiness  Stiffness of right shoulder, not elsewhere classified  Acute pain of right shoulder  Disorder of the skin and subcutaneous tissue related to radiation, unspecified  Abnormal posture  Malignant neoplasm of upper-outer quadrant of right breast in female, estrogen receptor positive ([place])     Problem List Patient Active Problem List   Diagnosis Date Noted   Diarrhea 10/24/2020   Port-A-Cath in place 09/26/2020   Hypokalemia 09/26/2020   Malignant neoplasm of upper-outer quadrant of right breast in female, estrogen receptor positive ([place]) 08/15/2020   Personal history of COVID-19 07/04/2020   Mixed hyperlipidemia 07/04/2020   Diabetes mellitus type 2 in obese ([place]) 04/28/2019   Precordial pain 04/28/2019   DOE (dyspnea on exertion) 04/28/2019   Tendonitis, Achilles, right 01/17/2018   Heel pain 09/26/2015   Plantar fasciitis of right foot 10/25/2014   Equinus deformity of foot, acquired 10/25/2014   Tarsal tunnel syndrome 10/25/2014   Deformity of metatarsal bone of right foot 10/25/2014   Genital herpes, unspecified 12/21/2012   DIABETES MELLITUS II, UNCOMPLICATED 47/02/6282   OBESITY, NOS 08/25/2006   ANEMIA, ACUTE BLOOD LOSS 08/25/2006   DEPRESSION, MAJOR, RECURRENT 08/25/2006   HEADACHE, UNSPECIFIED 08/25/2006    Stark Bray, PT 04/29/2021, 2:38 PM  [facility] @ [place]  [place], [place], [address] Phone: [phone]   Fax:  [phone]  Name: Rachel Gaines MRN: [id number] Date of Birth: [birth year]

## 2021-04-30 ENCOUNTER — Telehealth: Payer: Self-pay | Admitting: *Deleted

## 2021-04-30 NOTE — Telephone Encounter (Signed)
ACCRU-[place]-2102 - TREATMENT OF ESTABLISHED CHEMOTHERAPY-INDUCED NEUROPATHY WITH N-PALMITOYLETHANOLAMIDE, A CANNABIMIMETIC NUTRACEUTICAL: A RANDOMIZED DOUBLE-BLIND PHASE II PILOT TRIAL   Research nurse attempted to reach the pt by phone this morning to complete the pt's week #5 call.  The pt did not answer her phone.  The nurse left a message asking the pt to return the nurse's call.  If the pt doesn't return the nurse's call today, then the research staff will attempt to reach her tomorrow.   Brion Aliment RN, BSN, CCRP Clinical Research Nurse Lead 04/30/2021 2:42 PM

## 2021-05-01 ENCOUNTER — Other Ambulatory Visit: Payer: Self-pay

## 2021-05-01 ENCOUNTER — Telehealth: Payer: Self-pay | Admitting: Emergency Medicine

## 2021-05-01 ENCOUNTER — Ambulatory Visit: Payer: 59 | Admitting: Rehabilitation

## 2021-05-01 ENCOUNTER — Encounter: Payer: Self-pay | Admitting: Rehabilitation

## 2021-05-01 DIAGNOSIS — R293 Abnormal posture: Secondary | ICD-10-CM | POA: Diagnosis not present

## 2021-05-01 DIAGNOSIS — C50411 Malignant neoplasm of upper-outer quadrant of right female breast: Secondary | ICD-10-CM | POA: Diagnosis not present

## 2021-05-01 DIAGNOSIS — R42 Dizziness and giddiness: Secondary | ICD-10-CM | POA: Diagnosis not present

## 2021-05-01 DIAGNOSIS — M25511 Pain in right shoulder: Secondary | ICD-10-CM | POA: Diagnosis not present

## 2021-05-01 DIAGNOSIS — L599 Disorder of the skin and subcutaneous tissue related to radiation, unspecified: Secondary | ICD-10-CM | POA: Diagnosis not present

## 2021-05-01 DIAGNOSIS — Z170 Estrogen receptor positive status [ER+]: Secondary | ICD-10-CM | POA: Diagnosis not present

## 2021-05-01 DIAGNOSIS — M25611 Stiffness of right shoulder, not elsewhere classified: Secondary | ICD-10-CM | POA: Diagnosis not present

## 2021-05-01 NOTE — Therapy (Signed)
[facility] @ [place], [place], [address] Phone: [phone]   Fax:  [phone]  Physical Therapy Treatment  Patient Details  Name: Rachel Gaines MRN: [id number] Date of Birth: 01-May-1956 Referring Provider (PT): Dr. Lindi Adie   Encounter Date: 05/01/2021   PT End of Session - 05/01/21 0923     Visit Number 12    Number of Visits 21    PT Start Time 0850    PT Stop Time 0915    PT Time Calculation (min) 25 min    Activity Tolerance Patient tolerated treatment well    Behavior During Therapy [facility] for tasks assessed/performed             Past Medical History:  Diagnosis Date   Abnormal Pap smear of cervix 2014   ASCUS with negative HR HPV    Anemia    Breast cancer ([place]) 11/2020   Cancer ([place]) 06/2020   right breast IDC with DCIS   Cataract    Diabetes mellitus without complication ([place])    Hyperlipidemia    on medicine   Plantar fasciitis    STD (sexually transmitted disease)    HSV type II    Past Surgical History:  Procedure Laterality Date   BREAST LUMPECTOMY WITH RADIOACTIVE SEED AND SENTINEL LYMPH NODE BIOPSY Right 12/18/2020   Procedure: RIGHT BREAST LUMPECTOMY WITH RADIOACTIVE SEED AND RIGHT AXILLARY SENTINEL LYMPH NODE BIOPSY;  Surgeon: Rolm Bookbinder, MD;  Location: [facility];  Service: General;  Laterality: Right;   CATARACT EXTRACTION  2019   COLONOSCOPY     COLPOSCOPY W/ BIOPSY / CURETTAGE  02/04/1999   Chronic cervicitis   EYE SURGERY     PORTACATH PLACEMENT N/A 08/26/2020   Procedure: INSERTION PORT-A-CATH;  Surgeon: Rolm Bookbinder, MD;  Location: [facility];  Service: General;  Laterality: N/A;  START TIME OF 12:30 PM FOR 60 MINUTES ROOM 8    There were no vitals filed for this visit.   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  Pt had right lumpectomy with SLNB on 12/18/2020.  Radiation ended in September.    Currently in Pain? No/denies                     Vestibular Assessment - 05/01/21 0001       Positional Testing   Horizontal Canal Testing --   both sides negative     Dix-Hallpike Right   Dix-Hallpike Right Duration no nystagmus present but feeling weird then having positive s/s with turingon the left side and head to the floor      Dix-Hallpike Left   Dix-Hallpike Left Duration negative                       Vestibular Treatment/Exercise - 05/01/21 0001       Vestibular Treatment/Exercise   Vestibular Treatment Provided Canalith Repositioning    Canalith Repositioning Epley Manuever Right       EPLEY MANUEVER RIGHT   Number of Reps  1    Overall Response Improved Symptoms                         PT Long Term Goals - 04/21/21 1454  Additional Long Term Goals   Additional Long Term Goals Yes      PT LONG TERM GOAL #6   Title Pt will report no room spinning with positon changes    Time 6    Period Weeks    Status New      PT LONG TERM GOAL #7   Title pt will be ind with self exercises including brand daroff and and VOR as needed    Time 6    Period Weeks    Status New                   Plan - 05/01/21 1700     Clinical Impression Statement DHM improved today all all others still negative.  No nystagmus with DHM to the Rt that was visible but feeling a little weird.  Performed CRM and told pt to return to BD exercise.    PT Frequency 1x / week    PT Duration 6 weeks    PT Treatment/Interventions ADLs/Self Care Home Management;Therapeutic exercise;Patient/family education;Scar mobilization;Passive range of motion;Manual lymph drainage;Manual techniques;Orthotic Fit/Training;Iontophoresis 4mg /ml Dexamethasone    PT Next Visit Plan see if pt can schedule 2x/wk,consider ionto if cert  signed and post shoulder pain returns, SoZO around Dec 28 (set up) do in therapy, cont MFR techniques to cording, cont TG soft, PROM, Right breast MLD/foam pad prn  (vestibular - how has dizziness been? repeat DHM and CRM    Consulted and Agree with Plan of Care Patient;Family member/caregiver             Patient will benefit from skilled therapeutic intervention in order to improve the following deficits and impairments:     Visit Diagnosis: Dizziness and giddiness     Problem List Patient Active Problem List   Diagnosis Date Noted   Diarrhea 10/24/2020   Port-A-Cath in place 09/26/2020   Hypokalemia 09/26/2020   Malignant neoplasm of upper-outer quadrant of right breast in female, estrogen receptor positive ([place]) 08/15/2020   Personal history of COVID-19 07/04/2020   Mixed hyperlipidemia 07/04/2020   Diabetes mellitus type 2 in obese ([place]) 04/28/2019   Precordial pain 04/28/2019   DOE (dyspnea on exertion) 04/28/2019   Tendonitis, Achilles, right 01/17/2018   Heel pain 09/26/2015   Plantar fasciitis of right foot 10/25/2014   Equinus deformity of foot, acquired 10/25/2014   Tarsal tunnel syndrome 10/25/2014   Deformity of metatarsal bone of right foot 10/25/2014   Genital herpes, unspecified 12/21/2012   DIABETES MELLITUS II, UNCOMPLICATED 17/49/4496   OBESITY, NOS 08/25/2006   ANEMIA, ACUTE BLOOD LOSS 08/25/2006   DEPRESSION, MAJOR, RECURRENT 08/25/2006   HEADACHE, UNSPECIFIED 08/25/2006    Stark Bray, PT 05/01/2021, 9:25 AM  [facility] @ [place], [place], [address] Phone: [phone]   Fax:  [phone]  Name: Rachel Gaines MRN: [id number] Date of Birth: 06/14/1956

## 2021-05-01 NOTE — Telephone Encounter (Signed)
ACCRU-[place]-2102 - TREATMENT OF ESTABLISHED CHEMOTHERAPY-INDUCED NEUROPATHY WITH N-PALMITOYLETHANOLAMIDE, A CANNABIMIMETIC NUTRACEUTICAL: A RANDOMIZED DOUBLE-BLIND PHASE II PILOT TRIAL  Attempted to contact pt for Week 5 study call, no answer.  Left VM with contact info requesting a call back.  Will attempt again on Monday.  Wells Guiles 'Learta CoddingNeysa Bonito, RN, BSN Clinical Research Nurse I 05/01/21 2:03 PM

## 2021-05-04 ENCOUNTER — Telehealth: Payer: Self-pay | Admitting: Emergency Medicine

## 2021-05-04 NOTE — Telephone Encounter (Signed)
ACCRU-[place]-2102 - TREATMENT OF ESTABLISHED CHEMOTHERAPY-INDUCED NEUROPATHY WITH N-PALMITOYLETHANOLAMIDE, A CANNABIMIMETIC NUTRACEUTICAL: A RANDOMIZED DOUBLE-BLIND PHASE II PILOT TRIAL  Attempted to contact pt for week 5 study call, no answer.  Did not leave VM today as VM was left on Friday requesting a call back.  Will attempt to contact again tomorrow.  Wells Guiles 'Learta CoddingNeysa Bonito, RN, BSN Clinical Research Nurse I 05/04/21 4:20 PM

## 2021-05-05 ENCOUNTER — Telehealth: Payer: Self-pay | Admitting: *Deleted

## 2021-05-05 NOTE — Telephone Encounter (Signed)
ACCRU-[place]-2102 - TREATMENT OF ESTABLISHED CHEMOTHERAPY-INDUCED NEUROPATHY WITH N-PALMITOYLETHANOLAMIDE, A CANNABIMIMETIC NUTRACEUTICAL: A RANDOMIZED DOUBLE-BLIND PHASE II PILOT TRIAL   Attempted to contact pt for week 5 study call today, no answer.  Nurse left VM today requesting pt to return nurse's call. This nurse has left several voice messages since 04/30/21 trying to reach the pt to conduct this weekly safety call for the ACCRU-[place]-2102 study.  The pt has not returned any phone calls.  Will await pt's return call. Will attempt to see the pt at her next office visit on 05/08/21.  Brion Aliment RN, BSN, CCRP Clinical Research Nurse Lead 05/05/2021 1:30 PM

## 2021-05-08 ENCOUNTER — Inpatient Hospital Stay: Payer: 59

## 2021-05-08 ENCOUNTER — Encounter: Payer: Self-pay | Admitting: *Deleted

## 2021-05-08 DIAGNOSIS — C50411 Malignant neoplasm of upper-outer quadrant of right female breast: Secondary | ICD-10-CM

## 2021-05-08 NOTE — Research (Addendum)
ACCRU-[place]-2102 - TREATMENT OF ESTABLISHED CHEMOTHERAPY-INDUCED NEUROPATHY WITH N-PALMITOYLETHANOLAMIDE, A CANNABIMIMETIC NUTRACEUTICAL: A RANDOMIZED DOUBLE-BLIND PHASE II PILOT TRIAL    WEEK 6 - phone call (assessed data over the past 2 weeks)  Patient was called this morning after the pt canceled her infusion room appt due to weather conditions.  Identity was confirmed with the pt using 2 patient identifiers.  The research staff tried multiple times to reach the pt by phone from 04/30/21 -05/05/21 for her week 5 study phone call. The pt did not answer her phone or return the nurse's calls. The pt said that her dizziness has improved this week.  The pt said she is still going to physical therapy for her dizziness problems.     QUESTIONNAIRE: The pt reports that she has completed all of her questionnaires and mailed them to the research department. Patient reports that she completed her week 6 questionnaire yesterday and mailed it yesterday.  She said that she completed/mailed another set of questionnaires in the "beginning of the week" on 05/04/21.  The pt was thanked for completing her questionnaires.  The site has not received pt's questionnaires for week 4, 5, or week 6 yet.     INVESTIGATIONAL PRODUCT: Patient reports taking 7 capsules since 05/01/2021 (started taking drug that morning once daily in AM with food) through 05/07/21.  She states she took all of her capsules last week too from 04/24/21 through 04/30/21.  She said that she did not take her study drug on Monday (04/20/21) and Tuesday (04/21/21) when her dizziness started, but she said that she has taken her study drug daily since then.  Patient is reminded on the importance of continuing study medication compliance, verbalizes understanding.   ADVERSE EVENTS: USING CTCAE V. 5.0 Per protocol (Appendix VIII) pt was asked about nausea, vomiting, and diarrhea.  Pt denies nausea, vomiting, diarrhea in the past 2 weeks.  She does report ongoing mild  "muscle cramps" in her feet/legs that are intermittent and only bother her when she gets in bed.  The pt said her severe dizziness started on 04/20/21.  She said that her dizziness is "mild" since Wednesday, 05/06/21.     Armilda Vanderlinden [id number]   05/08/2021   Adverse Event Log   Study/Protocol: ACCRU-[place]-2102 WEEK 6  AE's (AE's reviewed with the pt for the past 2 weeks) Event Grade Onset Date Resolved Date Drug Name Attribution Treatment Comments  Muscle cramp  Grade 1  04/09/21 Ongoing PEA/placebo  Unrelated      Dizziness Grade 3  04/20/21 05/05/21 PEA/placebo Unrelated PT sessions And  Meclizine    Dizziness  Grade 1 05/06/21 Ongoing  PEA/placebo Unrelated PT sessions and meclizine   FOLLOW-UP: Patient denies concerns at this time.  Patients agrees to next follow-up phone call on 05/14/2021.   MEDICATIONS: Pt reports starting meclizine 12.5mg  on 04/20/21 for relief of her dizziness. She reports she is still using the meclizine with relief.  She denies any other new medications. She denies any new allergies.  Patient denies taking any other cannabinoid products like CBD and/or THC, was re-educated on importance of not taking them while on study.  Pt verbalizes understanding of instructions.      The patient was thanked for their time and continued voluntary participation in this study. Patient has been provided direct contact information and is encouraged to contact this Nurse for any needs or questions.  Study staff notified about the pt's missed week 5 phone call.    Brion Aliment  RN, BSN, [place] Nurse Lead 05/08/2021 2:05 PM

## 2021-05-11 ENCOUNTER — Encounter: Payer: Self-pay | Admitting: Hematology and Oncology

## 2021-05-13 ENCOUNTER — Telehealth: Payer: Self-pay

## 2021-05-13 ENCOUNTER — Inpatient Hospital Stay: Payer: 59 | Attending: Hematology and Oncology

## 2021-05-13 NOTE — Telephone Encounter (Signed)
RN called patient regarding today's infusion appointment at 3p. No answer, voicemail left. Patient was instructed to call infusion room to get her appointment rescheduled.

## 2021-05-15 ENCOUNTER — Telehealth: Payer: Self-pay | Admitting: *Deleted

## 2021-05-15 ENCOUNTER — Encounter: Payer: Self-pay | Admitting: *Deleted

## 2021-05-15 DIAGNOSIS — Z170 Estrogen receptor positive status [ER+]: Secondary | ICD-10-CM

## 2021-05-15 DIAGNOSIS — C50411 Malignant neoplasm of upper-outer quadrant of right female breast: Secondary | ICD-10-CM

## 2021-05-15 NOTE — Research (Signed)
ACCRU-[place]-2102 - TREATMENT OF ESTABLISHED CHEMOTHERAPY-INDUCED NEUROPATHY WITH N-PALMITOYLETHANOLAMIDE, A CANNABIMIMETIC NUTRACEUTICAL: A RANDOMIZED DOUBLE-BLIND PHASE II PILOT TRIAL   WEEK 7 - phone call Rn attempted to reach pt again by phone today, and the patient was available to speak to the nurse this afternoon.  The pt was not available by phone on 05/14/21 and this morning. Identity was confirmed with the pt using 2 patient identifiers. The pt said that her dizziness has not completely resolved, but she describes it as "mild".  The pt reported fatigue as a new symptom this week.   QUESTIONNAIRE: The pt reports that she completed her week 7 questionnaires yesterday, and she mailed them this morning to the research department   The pt was thanked for completing her questionnaires.  The site has not received the pt's questionnaires for week 5 or week 6 yet.     INVESTIGATIONAL PRODUCT: Patient reports taking 7 capsules since 05/08/2021 (started taking drug that morning once daily in AM with food) through 05/14/21.  Patient is reminded on the importance of continuing study medication compliance, verbalizes understanding.   ADVERSE EVENTS: USING CTCAE V. 5.0 Per protocol (Appendix VIII) pt was asked about nausea, vomiting, and diarrhea.  Pt denies nausea, vomiting, diarrhea in the past  week.  She does report ongoing mild "muscle cramps" in her feet/legs that are intermittent and only bother her when she gets in the bed.  The pt also reports fatigue as new adverse event this week (start date 05/11/21).  She reports that she has not left her house because of her fatigue.  She reports her fatigue as "moderate" (grade 2).     Albana Saperstein [id number]   05/15/2021   Adverse Event Log   Study/Protocol: ACCRU-[place]-2102 WEEK 7  AE's (Rn met with Dr. Lindi Adie on 05/18/21 for attribution related to new AE-fatigue) Event Grade Onset Date Resolved Date Drug Name Attribution Treatment Comments  Muscle cramp   Grade 1  04/09/21 Ongoing PEA/placebo  Unrelated      Dizziness  Grade 1 05/06/21 Ongoing  PEA/placebo Unrelated PT sessions and meclizine    Fatigue  Grade 2  05/11/21 Ongoing  PEA/placebo  Unrelated     FOLLOW-UP: Patient denies concerns at this time.  Patients agrees to next follow-up phone call on 05/22/2021.   MEDICATIONS: Pt reports starting meclizine 12.19m on 04/20/21 for relief of her dizziness. She reports she is still using the meclizine with relief.  She denies any other new medications. She denies any new allergies.  Patient denies taking any other cannabinoid products like CBD and/or THC, was re-educated on importance of not taking them while on study.  Pt verbalizes understanding of instructions.      The patient was thanked for their time and continued voluntary participation in this study. Patient has been provided direct contact information and is encouraged to contact this Nurse for any needs or questions.    NBrion AlimentRN, BSN, CCRP Clinical Research Nurse Lead 05/15/2021 4:36 PM

## 2021-05-15 NOTE — Telephone Encounter (Signed)
ACCRU-[place]-2102 - TREATMENT OF ESTABLISHED CHEMOTHERAPY-INDUCED NEUROPATHY WITH N-PALMITOYLETHANOLAMIDE, A CANNABIMIMETIC NUTRACEUTICAL: A RANDOMIZED DOUBLE-BLIND PHASE II PILOT TRIAL   WEEK 7 weekly study phone call - pt not available by phone x 2 days  The pt was scheduled for a clinic visit on 05/13/21 and did not come to her visit. Research nurse called and left pt messages on 05/14/21 and 05/15/21 asking her to return the nurse's call.   The pt was reminded that these safety, weekly calls are brief and would take less than 5 minutes.  Will await pt's return call.   Brion Aliment RN, BSN, CCRP Clinical Research Nurse Lead 05/15/2021 10:27 AM

## 2021-05-18 ENCOUNTER — Encounter: Payer: Self-pay | Admitting: Hematology and Oncology

## 2021-05-18 ENCOUNTER — Telehealth: Payer: Self-pay | Admitting: *Deleted

## 2021-05-18 NOTE — Telephone Encounter (Signed)
RN received call from pt regarding two missed appointments last week for tx.  Pt requesting to have tx this week.  At this time there is no open availability in the infusion room.  Pt currently scheduled to receive tx next week.  RN verified next week appts with pt and pt verbalized understanding of date and time.

## 2021-05-22 ENCOUNTER — Encounter: Payer: Self-pay | Admitting: *Deleted

## 2021-05-22 DIAGNOSIS — C50411 Malignant neoplasm of upper-outer quadrant of right female breast: Secondary | ICD-10-CM

## 2021-05-22 NOTE — Research (Signed)
ACCRU-[place]-2102 - TREATMENT OF ESTABLISHED CHEMOTHERAPY-INDUCED NEUROPATHY WITH N-PALMITOYLETHANOLAMIDE, A CANNABIMIMETIC NUTRACEUTICAL: A RANDOMIZED DOUBLE-BLIND PHASE II PILOT TRIAL    WEEK 8 - phone call Rn called pt for her final weekly call.   Identity was confirmed with the pt using 2 patient identifiers. The pt said that her dizziness is ongoing.  The pt reported ongoing fatigue this week.   QUESTIONNAIRE: The pt reports that she will complete her week 8 questionnaires today.  She was asked to bring in her week 8 questionnaires on 05/29/21.  The site has not received the pt's questionnaires for week 5,6, or 7 yet.     INVESTIGATIONAL PRODUCT: Patient reports taking 7 capsules since 05/15/2021 (started taking drug that morning once daily in AM with food) through 05/21/21.  The pt said that she took her last study drug pill this morning on 05/22/21.  The pt was told to not take anymore study drug pills. The pt was encouraged to bring all of her remaining pills and pill bottles to the cancer clinic on 05/29/21 at her next office visit appt.  Patient is reminded that her study drug can be unblinded once all of the study data has been entered and her study drug returned for the final drug accountability check.    ADVERSE EVENTS: USING CTCAE V. 5.0 Per protocol (Appendix VIII) pt was asked about nausea, vomiting, and diarrhea.  Pt denies nausea and vomiting.  She states she developed diarrhea this week (start date Wednesday 05/20/21).  She states she has had 3 stools today.  She states she will take her anti-diarrheal medications if her diarrhea persists.  She does report ongoing mild "muscle cramps" in her feet/legs that are intermittent.  She still reports dizziness this week, but she denies any falls.  The pt states that her fatigue is ongoing this week.  She reports her fatigue as "moderate" (grade 2).     Edrie Ehrich [id number]   05/22/2021   Adverse Event Log   Study/Protocol:  ACCRU-[place]-2102 WEEK 8  AE's (Rn met with Dr. Lindi Adie on 05/25/21 for attribution related to new AE-diarrhea) Event Grade Onset Date Resolved Date Drug Name Attribution Treatment Comments  Muscle cramp  Grade 1  04/09/21 Ongoing PEA/placebo  Unrelated      Dizziness  Grade 1 05/06/21 Ongoing  PEA/placebo Unrelated PT sessions and meclizine    Fatigue  Grade 2  05/11/21 Ongoing  PEA/placebo  Unrelated      Diarrhea  Grade 1  05/20/21 Ongoing  PEA/placebo  Unrelated      FOLLOW-UP: Patient denies concerns at this time.  Patients agrees to bring in her week 8 questionnaires and her study drug pill bottles to the cancer center on 05/29/21- her next office visit.  Will await pt's mailed questionnaires.   MEDICATIONS: Pt reports starting meclizine 12.40m on 04/20/21 for relief of her dizziness. She reports she is still using the meclizine with relief.  She denies any other new medications. She denies any new allergies.  Patient denies taking any other cannabinoid products like CBD and/or THC, was re-educated on importance of not taking them while on study.  Pt verbalizes understanding of instructions.      The patient was thanked for her time and congratulated for completing the 8 week study.  Patient has been provided direct contact information and is encouraged to contact this nurse for any needs or questions.  Dr. GLindi Adiewas informed that the pt has completed her 8 weeks of study treatment.  NLexine Baton  Bertram Denver RN, BSN, CCRP Clinical Research Nurse Lead 05/22/2021 1:37 PM

## 2021-05-25 ENCOUNTER — Encounter: Payer: Self-pay | Admitting: Hematology and Oncology

## 2021-05-25 NOTE — Progress Notes (Signed)
No reload trastuzumab at this time for dose to be given 05/29/21.   T.O. Dr Jannifer Hick, PharmD 05/25/21

## 2021-05-27 ENCOUNTER — Encounter: Payer: Self-pay | Admitting: Rehabilitation

## 2021-05-27 ENCOUNTER — Ambulatory Visit: Payer: 59 | Admitting: Rehabilitation

## 2021-05-27 ENCOUNTER — Other Ambulatory Visit: Payer: Self-pay

## 2021-05-27 DIAGNOSIS — L599 Disorder of the skin and subcutaneous tissue related to radiation, unspecified: Secondary | ICD-10-CM | POA: Diagnosis not present

## 2021-05-27 DIAGNOSIS — Z170 Estrogen receptor positive status [ER+]: Secondary | ICD-10-CM | POA: Diagnosis not present

## 2021-05-27 DIAGNOSIS — C50411 Malignant neoplasm of upper-outer quadrant of right female breast: Secondary | ICD-10-CM | POA: Diagnosis not present

## 2021-05-27 DIAGNOSIS — M25511 Pain in right shoulder: Secondary | ICD-10-CM | POA: Diagnosis not present

## 2021-05-27 DIAGNOSIS — R293 Abnormal posture: Secondary | ICD-10-CM | POA: Diagnosis not present

## 2021-05-27 DIAGNOSIS — R42 Dizziness and giddiness: Secondary | ICD-10-CM | POA: Diagnosis not present

## 2021-05-27 DIAGNOSIS — M25611 Stiffness of right shoulder, not elsewhere classified: Secondary | ICD-10-CM | POA: Diagnosis not present

## 2021-05-27 NOTE — Therapy (Signed)
[facility] @ [address] [place], [place], [address] Phone: [phone]   Fax:  [phone]  Physical Therapy Treatment  Patient Details  Name: Rachel Gaines MRN: [id number] Date of Birth: April 30, 1956 Referring Provider (PT): Dr. Lindi Adie   Encounter Date: 05/27/2021   PT End of Session - 05/27/21 1702     Visit Number 13    Number of Visits 21    Date for PT Re-Evaluation 06/24/21    PT Start Time 1400    PT Stop Time 1440    PT Time Calculation (min) 40 min    Activity Tolerance Patient tolerated treatment well    Behavior During Therapy [facility] for tasks assessed/performed             Past Medical History:  Diagnosis Date   Abnormal Pap smear of cervix 2014   ASCUS with negative HR HPV    Anemia    Breast cancer ([place]) 11/2020   Cancer ([place]) 06/2020   right breast IDC with DCIS   Cataract    Diabetes mellitus without complication ([place])    Hyperlipidemia    on medicine   Plantar fasciitis    STD (sexually transmitted disease)    HSV type II    Past Surgical History:  Procedure Laterality Date   BREAST LUMPECTOMY WITH RADIOACTIVE SEED AND SENTINEL LYMPH NODE BIOPSY Right 12/18/2020   Procedure: RIGHT BREAST LUMPECTOMY WITH RADIOACTIVE SEED AND RIGHT AXILLARY SENTINEL LYMPH NODE BIOPSY;  Surgeon: Rolm Bookbinder, MD;  Location: [facility];  Service: General;  Laterality: Right;   CATARACT EXTRACTION  2019   COLONOSCOPY     COLPOSCOPY W/ BIOPSY / CURETTAGE  02/04/1999   Chronic cervicitis   EYE SURGERY     PORTACATH PLACEMENT N/A 08/26/2020   Procedure: INSERTION PORT-A-CATH;  Surgeon: Rolm Bookbinder, MD;  Location: [facility];  Service: General;  Laterality: N/A;  START TIME OF 12:30 PM FOR 60 MINUTES ROOM 8    There were no vitals filed for this visit.   Subjective Assessment - 05/27/21 1404     Subjective I am not spinning but sometimes light headed. took meclizine on Monday.  I think my  tightness in the arm is still there.    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  Pt had right lumpectomy with SLNB on 12/18/2020.  Radiation ended in September.    Currently in Pain? No/denies                [facility] PT Assessment - 05/27/21 0001       AROM   Right Shoulder Flexion 135 Degrees   pull   Right Shoulder ABduction 134 Degrees   pull                Vestibular Assessment - 05/27/21 0001       Positional Testing   Dix-Hallpike --   negative today     Dix-Hallpike Right   Dix-Hallpike Right Symptoms No nystagmus      Dix-Hallpike Left   Dix-Hallpike Left Symptoms No nystagmus                      [facility] Adult PT Treatment/Exercise - 05/27/21 0001       Exercises   Exercises Other Exercises      Shoulder Exercises: Standing   Other Standing Exercises row red x 5 pt reporting that  is all she can do due to pain, yellow bil ER x 10, bicep curls 2# x 10      Shoulder Exercises: Pulleys   Flexion 2 minutes    ABduction 2 minutes      Shoulder Exercises: Therapy Ball   Flexion Both;5 reps      Manual Therapy   Manual Therapy Edema management    Manual therapy comments repeated positional testing which was negative today    Edema Management gave pt foam chip insert circle for the bra as she noted some firmness                          PT Long Term Goals - 05/27/21 1416       PT LONG TERM GOAL #1   Title Pt will return to baseline shoulder ROM measurements and not demonstrate any signs of lymphedema.    Status On-going      PT LONG TERM GOAL #2   Title Pt will report decreased pain by 50% or greater    Status Achieved      PT LONG TERM GOAL #3   Title Pt will have right shoulder flexion and abd atleast 125 degrees for improved home activies    Status Achieved      PT LONG TERM GOAL #4   Title Quick dash will be no greater  than 18%    Status On-going      PT LONG TERM GOAL #6   Status Achieved      PT LONG TERM GOAL #7   Status Achieved                   Plan - 05/27/21 1703     Clinical Impression Statement Negative DHM today and pt reporting no significant spinning anymore.  returned to UE AAROM/and TE which was tolerated well but with increased shoulder pain with low reps.  Pt also noted some breast hardness which we can assess next visit if pt would like.    PT Frequency 1x / week    PT Duration 6 weeks    PT Treatment/Interventions ADLs/Self Care Home Management;Therapeutic exercise;Patient/family education;Scar mobilization;Passive range of motion;Manual lymph drainage;Manual techniques;Orthotic Fit/Training;Iontophoresis 4mg /ml Dexamethasone    PT Next Visit Plan (vestibular as needed (Rt sided BBPV))  how was foam? SoZO around Dec 28 (set up) do in therapy, cont MFR techniques to cording, cont TG soft, PROM, Right breast MLD/foam    Consulted and Agree with Plan of Care Patient             Patient will benefit from skilled therapeutic intervention in order to improve the following deficits and impairments:     Visit Diagnosis: Abnormal posture  Malignant neoplasm of upper-outer quadrant of right breast in female, estrogen receptor positive ([place])     Problem List Patient Active Problem List   Diagnosis Date Noted   Diarrhea 10/24/2020   Port-A-Cath in place 09/26/2020   Hypokalemia 09/26/2020   Malignant neoplasm of upper-outer quadrant of right breast in female, estrogen receptor positive ([place]) 08/15/2020   Personal history of COVID-19 07/04/2020   Mixed hyperlipidemia 07/04/2020   Diabetes mellitus type 2 in obese ([place]) 04/28/2019   Precordial pain 04/28/2019   DOE (dyspnea on exertion) 04/28/2019   Tendonitis, Achilles, right 01/17/2018   Heel pain 09/26/2015   Plantar fasciitis of right foot 10/25/2014   Equinus deformity of foot, acquired 10/25/2014   Tarsal  tunnel syndrome  10/25/2014   Deformity of metatarsal bone of right foot 10/25/2014   Genital herpes, unspecified 12/21/2012   DIABETES MELLITUS II, UNCOMPLICATED 09/40/0050   OBESITY, NOS 08/25/2006   ANEMIA, ACUTE BLOOD LOSS 08/25/2006   DEPRESSION, MAJOR, RECURRENT 08/25/2006   HEADACHE, UNSPECIFIED 08/25/2006    Stark Bray, PT 05/27/2021, 5:08 PM  [facility] @ [address] [place], [place], [address] Phone: [phone]   Fax:  [phone]  Name: Kapri Nero MRN: [id number] Date of Birth: 1956/01/31

## 2021-05-28 NOTE — Progress Notes (Signed)
Patient Care Team: Shon Baton, MD as PCP - General (Internal Medicine) Buford Dresser, MD as PCP - Cardiology (Cardiology) Mauro Kaufmann, RN as Oncology Nurse Navigator Rockwell Germany, RN as Oncology Nurse Navigator  DIAGNOSIS:    ICD-10-CM   1. Malignant neoplasm of upper-outer quadrant of right breast in female, estrogen receptor positive ([place])  C50.411    Z17.0       SUMMARY OF ONCOLOGIC HISTORY: Oncology History  Malignant neoplasm of upper-outer quadrant of right breast in female, estrogen receptor positive ([place])  08/08/2020 Initial Diagnosis   Screening detected right breast mass 2.2 cm by ultrasound upper outer quadrant biopsy: Grade 2-3 IDC with DCIS ER 60%, PR 20%, Ki-67 15%, HER-2 +3+   08/15/2020 Cancer Staging   Staging form: Breast, AJCC 8th Edition - Clinical stage from 08/15/2020: Stage IB (cT2, cN0, cM0, G3, ER+, PR+, HER2+) - Signed by Nicholas Lose, MD on 08/15/2020 Stage prefix: Initial diagnosis    09/12/2020 - 11/14/2020 Neo-Adjuvant Chemotherapy   Neoadjuvant Taxol/Herceptin x 12 weeks   09/17/2020 Surgery   Right breast lumpectomy ([facility]): IDC 2.1 cm 6 SLN negative for cancer   11/21/2020 -  Adjuvant Chemotherapy   Maintenance Herceptin   01/26/2021 - 02/20/2021 Radiation Therapy   Adjuvant radiation therapy (Dr. Lisbeth Renshaw)     CHIEF COMPLIANT: Herceptin maintenance  INTERVAL HISTORY: Rachel Gaines is a 65 y.o. with above-mentioned history of  right breast cancer currently on maintenance therapy with Herceptin. She presents to the clinic today for Herceptin maintenance.  She missed last treatment of Herceptin because of weather issues and lack of transportation.  She gets intermittent diarrhea from the Herceptin.  With the letrozole she gets leg cramps and hot flashes.  She had neuropathy from chemo and went on clinical trial with supplement called PEA and she tells me that it has helped her somewhat.  She is currently off gabapentin.  She does  feel depressed.  ALLERGIES:  is allergic to penicillins.  MEDICATIONS:  Current Outpatient Medications  Medication Sig Dispense Refill   albuterol (VENTOLIN HFA) 108 (90 Base) MCG/ACT inhaler INHALE 1 TO 2 PUFFS BY MOUTH INTO THE LUNGS EVERY 4 TO 6 HOURS AS NEEDED FOR SHORTNESS OF BREATH/WHEEZING/COUGH URGENCY 18 g 0   benzonatate (TESSALON) 200 MG capsule Take 1 capsule (200 mg total) by mouth 3 (three) times daily as needed. 30 capsule 1   Biotin 5 MG CAPS Take 5 mg by mouth daily.     Cholecalciferol (VITAMIN D3) 125 MCG (5000 UT) CAPS Take 5,000 Units by mouth daily.     colestipol (COLESTID) 5 g granules Take 5 grams by mouth 2 (two) times daily. 500 g 12   Continuous Blood Gluc Sensor (FREESTYLE LIBRE 2 SENSOR) MISC Change every 14 days to monitor blood glucose continuosly     Continuous Blood Gluc Sensor (FREESTYLE LIBRE 2 SENSOR) MISC Change every 14 days to monitor blood sugar as directed 9 each 3   diclofenac Sodium (VOLTAREN) 1 % GEL Apply 2 g topically every evening.     diphenoxylate-atropine (LOMOTIL) 2.5-0.025 MG tablet Take 1 tablet by mouth 4 (four) times daily as needed for diarrhea or loose stools 30 tablet 0   empagliflozin (JARDIANCE) 10 MG TABS tablet Take 1 tablet (10 mg total) by mouth daily. 90 tablet 3   empagliflozin (JARDIANCE) 10 MG TABS tablet Take 1 tablet (10 mg total) by mouth daily. 90 tablet 3   FREESTYLE LITE test strip  insulin detemir (LEVEMIR) 100 UNIT/ML FlexPen INJECT 34 UNITS SUBCUTANEOUSLY TWICE DAILY 15 mL 3   Insulin Glargine (BASAGLAR KWIKPEN) 100 UNIT/ML INJECT 34 UNITS UNDER THE SKIN TWICE DAILY (Patient not taking: Reported on 12/05/2020) 45 mL 3   insulin glargine-yfgn (SEMGLEE, YFGN,) 100 UNIT/ML Pen Inject 34 Units into the skin 2 (two) times daily. 15 mL 3   insulin lispro (HUMALOG) 100 UNIT/ML KwikPen Inject 14 Units into the skin 3 (three) times daily with meals. 45 mL 3   Investigational palmitoylethanolamide/placebo 400 MG capsule  ACCRU-[place]-2102 Take 1 capsule (400 mg) daily for 8 weeks. Take with food and swallow whole. Do not crush or open capsule. Store at room temperature. Keep container tightly closed. 60 capsule 0   letrozole (FEMARA) 2.5 MG tablet Take 1 tablet (2.5 mg total) by mouth daily. 90 tablet 3   lidocaine-prilocaine (EMLA) cream APPLY 1 APPLICATION TOPICALLY AS NEEDED. (Patient taking differently: Apply 1 application topically daily as needed (port access).) 30 g 0   NONFORMULARY OR COMPOUNDED ITEM [facility]:  Antiinflammatory cream - Diclofenac 3%, Baclofen 2%, Cyclobenzaprine 2%, Lidocaine 2%, dispense 120grams, apply 1-2 grams to affected area 3-4 times a day, +2refills. (Patient taking differently: [facility]:  Antiinflammatory cream - Diclofenac 3%, Baclofen 2%, Cyclobenzaprine 2%, Lidocaine 2%, dispense 120grams, apply 1-2 grams to affected area 3-4 times a day, +2refills.) 120 each 2   Pancrelipase, Lip-Prot-Amyl, (ZENPEP) 40000-126000 units CPEP Take 1 capsule by mouth in the morning and at bedtime. (Patient taking differently: Take 1 capsule by mouth 3 (three) times daily with meals.) 120 capsule    ramipril (ALTACE) 2.5 MG capsule TAKE 1 CAPSULE BY MOUTH EVERY OTHER DAY (Patient taking differently: Take 2.5 mg by mouth every other day.) 45 capsule 3   rosuvastatin (CRESTOR) 40 MG tablet TAKE 1 TABLET BY MOUTH ONCE DAILY (Patient taking differently: Take 40 mg by mouth every evening.) 90 tablet 2   sitaGLIPtin (JANUVIA) 100 MG tablet Take 1 tablet (100 mg total) by mouth daily. 90 tablet 3   UNIFINE PENTIPS 31G X 8 MM MISC USE WITH RX OF LEVEMIR AND HUMALOG FOUR TIMES DAILY.     UNIFINE PENTIPS 32G X 4 MM MISC USE WITH LEVEMIR AND HUMALOG 4X A DAY DX-E11.65     valACYclovir (VALTREX) 500 MG tablet Take 1 tablet (500 mg total) by mouth 2 (two) times daily. Take for 3 days for outbreak 30 tablet 2   No current facility-administered medications for this visit.    PHYSICAL EXAMINATION: ECOG  PERFORMANCE STATUS: 2 - Symptomatic, <50% confined to bed  Vitals:   05/29/21 0840  BP: (!) 142/61  Pulse: 60  Resp: 17  Temp: 97.7 F (36.5 C)  SpO2: 96%   Filed Weights   05/29/21 0840  Weight: 163 lb 11.2 oz (74.3 kg)    LABORATORY DATA:  I have reviewed the data as listed CMP Latest Ref Rng & Units 04/20/2021 04/17/2021 03/06/2021  Glucose 70 - 99 mg/dL 234(H) 221(H) 98  BUN 8 - 23 mg/dL 7(L) 8 9  Creatinine 0.44 - 1.00 mg/dL 0.66 0.59 0.74  Sodium 135 - 145 mmol/L 138 139 145  Potassium 3.5 - 5.1 mmol/L 3.6 4.0 4.0  Chloride 98 - 111 mmol/L 106 106 109  CO2 22 - 32 mmol/L _0 Calcium 8.9 - 10.3 mg/dL 9.9 9.5 10.0  Total Protein 6.5 - 8.1 g/dL 7.7 7.5 7.4  Total Bilirubin 0.3 - 1.2 mg/dL 1.5(H) 1.2 1.2  Alkaline Phos  38 - 126 U/L 94 89 84  AST 15 - 41 U/L _0 ALT 0 - 44 U/L _1 Lab Results  Component Value Date   WBC 4.7 05/29/2021   HGB 13.6 05/29/2021   HCT 40.8 05/29/2021   MCV 81.9 05/29/2021   PLT 234 05/29/2021   NEUTROABS 2.8 05/29/2021    ASSESSMENT & PLAN:  Malignant neoplasm of upper-outer quadrant of right breast in female, estrogen receptor positive ([place]) 08/08/2020:Screening detected right breast mass 2.2 cm by ultrasound upper outer quadrant biopsy: Grade 2-3 IDC with DCIS ER 60%, PR 20%, Ki-67 15%, HER-2 +3+ T2N0 stage Ia Treatment plan: 1.  Neoadjuvant chemotherapy with Taxol and Herceptin weekly x12 followed by Herceptin maintenance 2. breast conserving surgery with some Huntington biopsy 3.  Adjuvant radiation 4.  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 Diarrhea: Secondary to Herceptin 2. Mild fatigue 3. Hypokalemia-monitoring closely 4. Decreased appetite/weight loss 5. Peripheral Neuropathy: Patient was on clinical  trial with PEA and had a remarkable improvement in symptoms.  She is currently on gabapentin.  Leg cramps: Possibly due to letrozole.   MRI breast 11/17/2020: Right breast malignancy 2.2 cm, 5 mm FA (MRI biopsy: Benign fat necrosis)   Echocardiogram: 02/23/2021: EF 65 to 70% Patient did not experience any adverse effects to participation in the clinical trial with PEA. Return to clinic every 3 weeks for Herceptin every 6 weeks to follow-up with labs.  She will complete Herceptin by end of February.    No orders of the defined types were placed in this encounter.  The patient has a good understanding of the overall plan. she agrees with it. she will call with any problems that may develop before the next visit here.  Total time spent: 30 mins including face to face time and time spent for planning, charting and coordination of care  Rulon Eisenmenger, MD, MPH 05/29/2021  I, Thana Ates, am acting as scribe for Dr. Nicholas Lose.  I have reviewed the above documentation for accuracy and completeness, and I agree with the above.

## 2021-05-29 ENCOUNTER — Inpatient Hospital Stay (HOSPITAL_BASED_OUTPATIENT_CLINIC_OR_DEPARTMENT_OTHER): Payer: 59 | Admitting: Hematology and Oncology

## 2021-05-29 ENCOUNTER — Other Ambulatory Visit: Payer: Self-pay

## 2021-05-29 ENCOUNTER — Other Ambulatory Visit: Payer: Self-pay | Admitting: *Deleted

## 2021-05-29 ENCOUNTER — Inpatient Hospital Stay: Payer: 59

## 2021-05-29 ENCOUNTER — Encounter: Payer: Self-pay | Admitting: *Deleted

## 2021-05-29 ENCOUNTER — Inpatient Hospital Stay: Payer: 59 | Attending: Hematology and Oncology

## 2021-05-29 DIAGNOSIS — Z801 Family history of malignant neoplasm of trachea, bronchus and lung: Secondary | ICD-10-CM | POA: Insufficient documentation

## 2021-05-29 DIAGNOSIS — C50411 Malignant neoplasm of upper-outer quadrant of right female breast: Secondary | ICD-10-CM

## 2021-05-29 DIAGNOSIS — Z5181 Encounter for therapeutic drug level monitoring: Secondary | ICD-10-CM

## 2021-05-29 DIAGNOSIS — G620 Drug-induced polyneuropathy: Secondary | ICD-10-CM | POA: Insufficient documentation

## 2021-05-29 DIAGNOSIS — Z79899 Other long term (current) drug therapy: Secondary | ICD-10-CM

## 2021-05-29 DIAGNOSIS — Z170 Estrogen receptor positive status [ER+]: Secondary | ICD-10-CM

## 2021-05-29 DIAGNOSIS — Z923 Personal history of irradiation: Secondary | ICD-10-CM | POA: Insufficient documentation

## 2021-05-29 DIAGNOSIS — T451X5D Adverse effect of antineoplastic and immunosuppressive drugs, subsequent encounter: Secondary | ICD-10-CM | POA: Insufficient documentation

## 2021-05-29 DIAGNOSIS — R197 Diarrhea, unspecified: Secondary | ICD-10-CM | POA: Diagnosis not present

## 2021-05-29 DIAGNOSIS — Z880 Allergy status to penicillin: Secondary | ICD-10-CM | POA: Diagnosis not present

## 2021-05-29 DIAGNOSIS — Z5112 Encounter for antineoplastic immunotherapy: Secondary | ICD-10-CM | POA: Diagnosis not present

## 2021-05-29 DIAGNOSIS — Z95828 Presence of other vascular implants and grafts: Secondary | ICD-10-CM

## 2021-05-29 LAB — CBC WITH DIFFERENTIAL (CANCER CENTER ONLY)
Abs Immature Granulocytes: 0.01 10*3/uL (ref 0.00–0.07)
Basophils Absolute: 0 10*3/uL (ref 0.0–0.1)
Basophils Relative: 0 %
Eosinophils Absolute: 0.1 10*3/uL (ref 0.0–0.5)
Eosinophils Relative: 3 %
HCT: 40.8 % (ref 36.0–46.0)
Hemoglobin: 13.6 g/dL (ref 12.0–15.0)
Immature Granulocytes: 0 %
Lymphocytes Relative: 33 %
Lymphs Abs: 1.5 10*3/uL (ref 0.7–4.0)
MCH: 27.3 pg (ref 26.0–34.0)
MCHC: 33.3 g/dL (ref 30.0–36.0)
MCV: 81.9 fL (ref 80.0–100.0)
Monocytes Absolute: 0.3 10*3/uL (ref 0.1–1.0)
Monocytes Relative: 6 %
Neutro Abs: 2.8 10*3/uL (ref 1.7–7.7)
Neutrophils Relative %: 58 %
Platelet Count: 234 10*3/uL (ref 150–400)
RBC: 4.98 MIL/uL (ref 3.87–5.11)
RDW: 14.3 % (ref 11.5–15.5)
WBC Count: 4.7 10*3/uL (ref 4.0–10.5)
nRBC: 0 % (ref 0.0–0.2)

## 2021-05-29 LAB — CMP (CANCER CENTER ONLY)
ALT: 17 U/L (ref 0–44)
AST: 17 U/L (ref 15–41)
Albumin: 3.9 g/dL (ref 3.5–5.0)
Alkaline Phosphatase: 92 U/L (ref 38–126)
Anion gap: 9 (ref 5–15)
BUN: 7 mg/dL — ABNORMAL LOW (ref 8–23)
CO2: 26 mmol/L (ref 22–32)
Calcium: 9.5 mg/dL (ref 8.9–10.3)
Chloride: 110 mmol/L (ref 98–111)
Creatinine: 0.71 mg/dL (ref 0.44–1.00)
GFR, Estimated: >60 mL/min (ref >60)
Glucose, Bld: 95 mg/dL (ref 70–99)
Potassium: 3.8 mmol/L (ref 3.5–5.1)
Sodium: 145 mmol/L (ref 135–145)
Total Bilirubin: 1.2 mg/dL (ref 0.3–1.2)
Total Protein: 7.4 g/dL (ref 6.5–8.1)

## 2021-05-29 LAB — MAGNESIUM: Magnesium: 2 mg/dL (ref 1.7–2.4)

## 2021-05-29 MED ORDER — SODIUM CHLORIDE 0.9% FLUSH
10.0000 mL | INTRAVENOUS | Status: DC | PRN
  Administered 2021-05-29: 10 mL

## 2021-05-29 MED ORDER — TRASTUZUMAB-ANNS CHEMO 150 MG IV SOLR
6.0000 mg/kg | Freq: Once | INTRAVENOUS | Status: AC
  Administered 2021-05-29: 462 mg via INTRAVENOUS
  Filled 2021-05-29: qty 22

## 2021-05-29 MED ORDER — SODIUM CHLORIDE 0.9% FLUSH
10.0000 mL | Freq: Once | INTRAVENOUS | Status: AC
Start: 2021-05-29 — End: 2021-05-29
  Administered 2021-05-29: 10 mL

## 2021-05-29 MED ORDER — ACETAMINOPHEN 325 MG PO TABS
650.0000 mg | ORAL_TABLET | Freq: Once | ORAL | Status: AC
  Administered 2021-05-29: 650 mg via ORAL
  Filled 2021-05-29: qty 2

## 2021-05-29 MED ORDER — SODIUM CHLORIDE 0.9 % IV SOLN
Freq: Once | INTRAVENOUS | Status: AC
Start: 2021-05-29 — End: 2021-05-29

## 2021-05-29 MED ORDER — HEPARIN SOD (PORK) LOCK FLUSH 100 UNIT/ML IV SOLN
500.0000 [IU] | Freq: Once | INTRAVENOUS | Status: AC | PRN
  Administered 2021-05-29: 500 [IU]

## 2021-05-29 MED ORDER — DIPHENHYDRAMINE HCL 25 MG PO CAPS
50.0000 mg | ORAL_CAPSULE | Freq: Once | ORAL | Status: AC
  Administered 2021-05-29: 50 mg via ORAL
  Filled 2021-05-29: qty 2

## 2021-05-29 NOTE — Assessment & Plan Note (Addendum)
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 Diarrhea: Secondary to Herceptin 2. Mild fatigue 3. Hypokalemia-monitoring closely 4. Decreased appetite/weight loss 5. Peripheral Neuropathy: Patient was on clinical trial with PEA and had a remarkable improvement in symptoms.  She is currently on gabapentin.  Leg cramps: Possibly due to letrozole.  MRI breast5/23/2022: Right breast malignancy 2.2 cm, 5 mm FA (MRI biopsy: Benign fat necrosis)  Echocardiogram: 02/23/2021: EF 65 to 70%  Return to clinic every 3 weeks for Herceptin every 6 weeks to follow-up with labs.  She will complete Herceptin by end of February.

## 2021-05-29 NOTE — Patient Instructions (Signed)
[facility] MEDICAL ONCOLOGY  Discharge Instructions: Thank you for choosing [facility] to provide your oncology and hematology care.   If you have a lab appointment with the Cancer Center, please go directly to the Cancer Center and check in at the registration area.   Wear comfortable clothing and clothing appropriate for easy access to any Portacath or PICC line.   We strive to give you quality time with your provider. You may need to reschedule your appointment if you arrive late (15 or more minutes).  Arriving late affects you and other patients whose appointments are after yours.  Also, if you miss three or more appointments without notifying the office, you may be dismissed from the clinic at the provider's discretion.      For prescription refill requests, have your pharmacy contact our office and allow 72 hours for refills to be completed.    Today you received the following chemotherapy and/or immunotherapy agents herceptin      To help prevent nausea and vomiting after your treatment, we encourage you to take your nausea medication as directed.  BELOW ARE SYMPTOMS THAT SHOULD BE REPORTED IMMEDIATELY: *FEVER GREATER THAN 100.4 F (38 C) OR HIGHER *CHILLS OR SWEATING *NAUSEA AND VOMITING THAT IS NOT CONTROLLED WITH YOUR NAUSEA MEDICATION *UNUSUAL SHORTNESS OF BREATH *UNUSUAL BRUISING OR BLEEDING *URINARY PROBLEMS (pain or burning when urinating, or frequent urination) *BOWEL PROBLEMS (unusual diarrhea, constipation, pain near the anus) TENDERNESS IN MOUTH AND THROAT WITH OR WITHOUT PRESENCE OF ULCERS (sore throat, sores in mouth, or a toothache) UNUSUAL RASH, SWELLING OR PAIN  UNUSUAL VAGINAL DISCHARGE OR ITCHING   Items with * indicate a potential emergency and should be followed up as soon as possible or go to the Emergency Department if any problems should occur.  Please show the CHEMOTHERAPY ALERT CARD or IMMUNOTHERAPY ALERT CARD at check-in to  the Emergency Department and triage nurse.  Should you have questions after your visit or need to cancel or reschedule your appointment, please contact [facility]  Dept: [phone]  and follow the prompts.  Office hours are 8:00 a.m. to 4:30 p.m. Monday - Friday. Please note that voicemails left after 4:00 p.m. may not be returned until the following business day.  We are closed weekends and major holidays. You have access to a nurse at all times for urgent questions. Please call the main number to the clinic Dept: [phone] and follow the prompts.   For any non-urgent questions, you may also contact your provider using MyChart. We now offer e-Visits for anyone 18 and older to request care online for non-urgent symptoms. For details visit [email or web address].   Also download the MyChart app! Go to the app store, search "MyChart", open the app, select [facility], and log in with your MyChart username and password.  Due to Covid, a mask is required upon entering the hospital/clinic. If you do not have a mask, one will be given to you upon arrival. For doctor visits, patients may have 1 support person aged 18 or older with them. For treatment visits, patients cannot have anyone with them due to current Covid guidelines and our immunocompromised population.   

## 2021-05-29 NOTE — Research (Signed)
ACCRU-[place]-2102 - TREATMENT OF ESTABLISHED CHEMOTHERAPY-INDUCED NEUROPATHY WITH N-PALMITOYLETHANOLAMIDE, A CANNABIMIMETIC NUTRACEUTICAL: A RANDOMIZED DOUBLE-BLIND PHASE II PILOT TRIAL   WEEK 9- clinic visit to return study drug and questionnaires  Patient was into the clinic this morning for her routine follow up and infusion visit.  Pt agreed to return her study drug and questionnaires at this visit.     QUESTIONNAIRE: The pt returned her week 7 and week 8 completed questionnaires to the nurse.  The nurse reviewed them for completeness and accuracy.    INVESTIGATIONAL PRODUCT:   The pt also returned 1 bottle of study drug with 7 capsules inside.  The pt said that she discarded the other study drug bottle because it was empty. The study drug bottle was taken to the pharmacy and given to Kennith Center, pharmacist.  She performed the final drug accountability confirming 7 capsules returned. Patient is reminded that her study drug can be unblinded once all of the study data has been entered and her study drug returned for the final drug accountability check.  The pt said that she is eager to see if she was taking PEA or the placebo.    ADVERSE EVENTS: USING CTCAE V. 5.0 [place] Sers [id number]  Adverse Event Log   Study/Protocol: ACCRU-[place]-2102 WEEK 9  AE's (Rn met with Dr. Lindi Adie on 05/29/21 for attribution related to new AE's-anorexia, depression, and hot flashes, grade change for fatigue, now grade 1) Event Grade Onset Date Resolved Date Drug Name Attribution Treatment Comments  Muscle cramp  Grade 1  04/09/21 Ongoing PEA/placebo  Unrelated    Related to letrozole per MD  Dizziness  Grade 1 05/06/21 Ongoing  PEA/placebo Unrelated PT session this week; meclizine prn  05/29/21- Pt states "improving"   Fatigue  Grade 2  05/11/21 05/28/2021 PEA/placebo  Unrelated      Diarrhea  Grade 1  05/20/21 Ongoing  PEA/placebo  Unrelated   Related to Herceptin per MD   Fatigue  Grade 1  05/29/21 Ongoing  PEA/placebo   Unrelated     Depression  Grade 1 05/29/21  Ongoing PEA/placebo Unrelated     Hot flashes  Grade 1 05/29/21 Ongoing  PEA/placebo Unrelated  Related to letrozole per MD  Anorexia  Grade 1  05/29/21 Ongoing  PEA/placebo Unrelated  No significant wt loss    FOLLOW-UP: Patient denies concerns at this time. Dr. Lindi Adie examined the pt today, and he wanted the pt's study drug unblinded.     MEDICATIONS: She denies any other new medications. She denies any new allergies.     The patient was thanked for her time and congratulated for completing the 8 week study.  Patient has been provided direct contact information and is encouraged to contact this nurse for any needs or questions.  Once all of the pt's data has been entered, the research nurse will contact the study to unblind the pt's study drug.   Brion Aliment RN, BSN, CCRP Clinical Research Nurse Lead 05/29/2021 11:44 AM

## 2021-06-02 ENCOUNTER — Telehealth: Payer: Self-pay | Admitting: *Deleted

## 2021-06-02 DIAGNOSIS — C50411 Malignant neoplasm of upper-outer quadrant of right female breast: Secondary | ICD-10-CM

## 2021-06-02 DIAGNOSIS — Z170 Estrogen receptor positive status [ER+]: Secondary | ICD-10-CM

## 2021-06-02 NOTE — Telephone Encounter (Signed)
ACCRU-[place]-2102 - TREATMENT OF ESTABLISHED CHEMOTHERAPY-INDUCED NEUROPATHY WITH N-PALMITOYLETHANOLAMIDE, A CANNABIMIMETIC NUTRACEUTICAL: A RANDOMIZED DOUBLE-BLIND PHASE II PILOT TRIAL   The research nurse informed the ACCRU-[place]-2102 study team on Friday,05/29/21 that the pt had returned her study drug bottle and all of her weekly questionnaires had been entered into [facility].  Dr. Lindi Adie requested an non-urgent unblinding since the pt mentioned some benefit from taking the study drug.  The research nurse was notified on Monday, 06/01/21 that the pt's study drug was PEA 400 mg.  The research nurse informed Dr. Lindi Adie this morning, and then called the pt and informed her about her study drug.  The pt was told that she can purchase "micronized" PEA formula commercially from [employer] or from other online sites.  The pt was told that the recommended dose is 400 mg- 800 mg.  The pt was told that the study recommends "micronized" because it is smaller particles which equates to higher absorption level of the drug.  The pt was told to only purchase supplements that contain only PEA.  The pt was encouraged to call the nurse with any questions about her study drug, PEA.  The pt had no questions at this time. The pt was reminded that there are 2 remaining follow up calls - at 6 months and 12 months.  The pt verbalized understanding.   Brion Aliment RN, BSN, CCRP Clinical Research Nurse Lead 06/02/2021 10:50 AM

## 2021-06-04 ENCOUNTER — Other Ambulatory Visit (HOSPITAL_COMMUNITY): Payer: Self-pay

## 2021-06-04 MED ORDER — INSULIN GLARGINE-YFGN 100 UNIT/ML ~~LOC~~ SOPN
34.0000 [IU] | PEN_INJECTOR | Freq: Two times a day (BID) | SUBCUTANEOUS | 3 refills | Status: DC
  Filled 2021-06-04: qty 15, 22d supply, fill #0
  Filled 2021-06-12: qty 60, 88d supply, fill #0

## 2021-06-05 ENCOUNTER — Encounter: Payer: Self-pay | Admitting: Podiatry

## 2021-06-05 ENCOUNTER — Other Ambulatory Visit: Payer: Self-pay

## 2021-06-05 ENCOUNTER — Ambulatory Visit (INDEPENDENT_AMBULATORY_CARE_PROVIDER_SITE_OTHER): Payer: 59 | Admitting: Podiatry

## 2021-06-05 DIAGNOSIS — M79675 Pain in left toe(s): Secondary | ICD-10-CM | POA: Diagnosis not present

## 2021-06-05 DIAGNOSIS — E119 Type 2 diabetes mellitus without complications: Secondary | ICD-10-CM | POA: Diagnosis not present

## 2021-06-05 DIAGNOSIS — B351 Tinea unguium: Secondary | ICD-10-CM | POA: Diagnosis not present

## 2021-06-05 DIAGNOSIS — M79674 Pain in right toe(s): Secondary | ICD-10-CM

## 2021-06-05 NOTE — Progress Notes (Signed)
This patient returns to my office for at risk foot care.  This patient requires this care by a professional since this patient will be at risk due to having diabetes type 2.   Patient is having pain in the back of her right heel.  Previously diagnosed as achilles tendinitis by Dr.  Jacqualyn Posey.  This patient is unable to cut nails herself since the patient cannot reach her nails.These nails are painful walking and wearing shoes.  This patient presents for at risk foot care today.  General Appearance  Alert, conversant and in no acute stress.  Vascular  Dorsalis pedis and posterior tibial  pulses are palpable  bilaterally.  Capillary return is within normal limits  bilaterally. Temperature is within normal limits  bilaterally.  Neurologic  Senn-Weinstein monofilament wire test within normal limits  bilaterally. Muscle power within normal limits bilaterally.  Nails Thick disfigured discolored nails with subungual debris  from hallux to fifth toes bilaterally. No evidence of bacterial infection or drainage bilaterally.  Orthopedic  No limitations of motion  feet .  No crepitus or effusions noted.  No bony pathology or digital deformities noted.  HAV  B/L.   Skin  normotropic skin with no porokeratosis noted bilaterally.  No signs of infections or ulcers noted.     Onychomycosis  Pain in right toes  Pain in left toes    Consent was obtained for treatment procedures.   Mechanical debridement of nails 1-5  bilaterally performed with a nail nipper.  Filed with dremel without incident.    Return office visit  3 months                    Told patient to return for periodic foot care and evaluation due to potential at risk complications.   Gardiner Barefoot DPM

## 2021-06-09 ENCOUNTER — Other Ambulatory Visit (HOSPITAL_COMMUNITY): Payer: Self-pay

## 2021-06-10 ENCOUNTER — Ambulatory Visit: Payer: 59 | Attending: Hematology and Oncology | Admitting: Rehabilitation

## 2021-06-10 ENCOUNTER — Other Ambulatory Visit: Payer: Self-pay

## 2021-06-10 ENCOUNTER — Other Ambulatory Visit (HOSPITAL_COMMUNITY): Payer: Self-pay

## 2021-06-10 ENCOUNTER — Encounter: Payer: Self-pay | Admitting: Rehabilitation

## 2021-06-10 DIAGNOSIS — C50411 Malignant neoplasm of upper-outer quadrant of right female breast: Secondary | ICD-10-CM | POA: Insufficient documentation

## 2021-06-10 DIAGNOSIS — M25611 Stiffness of right shoulder, not elsewhere classified: Secondary | ICD-10-CM

## 2021-06-10 DIAGNOSIS — Z170 Estrogen receptor positive status [ER+]: Secondary | ICD-10-CM

## 2021-06-10 DIAGNOSIS — L599 Disorder of the skin and subcutaneous tissue related to radiation, unspecified: Secondary | ICD-10-CM | POA: Insufficient documentation

## 2021-06-10 DIAGNOSIS — M25511 Pain in right shoulder: Secondary | ICD-10-CM | POA: Insufficient documentation

## 2021-06-10 DIAGNOSIS — R293 Abnormal posture: Secondary | ICD-10-CM | POA: Insufficient documentation

## 2021-06-10 DIAGNOSIS — R42 Dizziness and giddiness: Secondary | ICD-10-CM | POA: Insufficient documentation

## 2021-06-10 MED ORDER — RAMIPRIL 2.5 MG PO CAPS
2.5000 mg | ORAL_CAPSULE | ORAL | 2 refills | Status: DC
  Filled 2021-06-10: qty 45, 90d supply, fill #0
  Filled 2021-09-01: qty 45, 90d supply, fill #1
  Filled 2021-11-27: qty 45, 90d supply, fill #2

## 2021-06-10 NOTE — Therapy (Signed)
[facility] @ [address] [place], [place], [address] Phone: [phone]   Fax:  [phone]  Physical Therapy Treatment  Patient Details  Name: Rachel Gaines MRN: [id number] Date of Birth: 10-12-1955 Referring Provider (PT): Dr. Lindi Adie   Encounter Date: 06/10/2021    Past Medical History:  Diagnosis Date   Abnormal Pap smear of cervix 2014   ASCUS with negative HR HPV    Anemia    Breast cancer ([place]) 11/2020   Cancer ([place]) 06/2020   right breast IDC with DCIS   Cataract    Diabetes mellitus without complication ([place])    Hyperlipidemia    on medicine   Plantar fasciitis    STD (sexually transmitted disease)    HSV type II    Past Surgical History:  Procedure Laterality Date   BREAST LUMPECTOMY WITH RADIOACTIVE SEED AND SENTINEL LYMPH NODE BIOPSY Right 12/18/2020   Procedure: RIGHT BREAST LUMPECTOMY WITH RADIOACTIVE SEED AND RIGHT AXILLARY SENTINEL LYMPH NODE BIOPSY;  Surgeon: Rolm Bookbinder, MD;  Location: [facility];  Service: General;  Laterality: Right;   CATARACT EXTRACTION  2019   COLONOSCOPY     COLPOSCOPY W/ BIOPSY / CURETTAGE  02/04/1999   Chronic cervicitis   EYE SURGERY     PORTACATH PLACEMENT N/A 08/26/2020   Procedure: INSERTION PORT-A-CATH;  Surgeon: Rolm Bookbinder, MD;  Location: [facility];  Service: General;  Laterality: N/A;  START TIME OF 12:30 PM FOR 60 MINUTES ROOM 8    There were no vitals filed for this visit.   Subjective Assessment - 06/10/21 1358     Subjective I just got sick in the bathroom so I should probably leave    Pertinent History R breast cancer, neoadjuvant chemo, grade 2-3 DCIS ER/PR+, will need radiation and antiestrogen treatment, diabetes.  Pt had right lumpectomy with SLNB on 12/18/2020.  Radiation ended in September.                                             PT Long Term Goals - 05/27/21 1416       PT LONG TERM  GOAL #1   Title Pt will return to baseline shoulder ROM measurements and not demonstrate any signs of lymphedema.    Status On-going      PT LONG TERM GOAL #2   Title Pt will report decreased pain by 50% or greater    Status Achieved      PT LONG TERM GOAL #3   Title Pt will have right shoulder flexion and abd atleast 125 degrees for improved home activies    Status Achieved      PT LONG TERM GOAL #4   Title Quick dash will be no greater than 18%    Status On-going      PT LONG TERM GOAL #6   Status Achieved      PT LONG TERM GOAL #7   Status Achieved                    Patient will benefit from skilled therapeutic intervention in order to improve the following deficits and impairments:     Visit Diagnosis: Malignant neoplasm of upper-outer quadrant of right breast in female, estrogen receptor positive ([facility])  Stiffness of right shoulder, not elsewhere classified     Problem List Patient Active Problem  List   Diagnosis Date Noted   Pain due to onychomycosis of toenails of both feet 06/05/2021   Diarrhea 10/24/2020   Port-A-Cath in place 09/26/2020   Hypokalemia 09/26/2020   Malignant neoplasm of upper-outer quadrant of right breast in female, estrogen receptor positive ([place]) 08/15/2020   Personal history of COVID-19 07/04/2020   Mixed hyperlipidemia 07/04/2020   Diabetes mellitus type 2 in obese ([place]) 04/28/2019   Precordial pain 04/28/2019   DOE (dyspnea on exertion) 04/28/2019   Tendonitis, Achilles, right 01/17/2018   Heel pain 09/26/2015   Plantar fasciitis of right foot 10/25/2014   Equinus deformity of foot, acquired 10/25/2014   Tarsal tunnel syndrome 10/25/2014   Deformity of metatarsal bone of right foot 10/25/2014   Genital herpes, unspecified 12/21/2012   DIABETES MELLITUS II, UNCOMPLICATED 02/72/5366   OBESITY, NOS 08/25/2006   ANEMIA, ACUTE BLOOD LOSS 08/25/2006   DEPRESSION, MAJOR, RECURRENT 08/25/2006   HEADACHE, UNSPECIFIED  08/25/2006    Stark Bray, PT 06/10/2021, 2:08 PM  [facility] @ [address] [place], [place], [address] Phone: [phone]   Fax:  [phone]  Name: Rossetta Kama MRN: [id number] Date of Birth: [birth year]

## 2021-06-12 ENCOUNTER — Ambulatory Visit (HOSPITAL_COMMUNITY)
Admission: RE | Admit: 2021-06-12 | Discharge: 2021-06-12 | Disposition: A | Discharge status: Home / Self Care | Payer: 59 | Source: Ambulatory Visit | Attending: Hematology and Oncology | Admitting: Hematology and Oncology

## 2021-06-12 ENCOUNTER — Other Ambulatory Visit: Payer: Self-pay

## 2021-06-12 ENCOUNTER — Other Ambulatory Visit (HOSPITAL_COMMUNITY): Payer: Self-pay

## 2021-06-12 DIAGNOSIS — E119 Type 2 diabetes mellitus without complications: Secondary | ICD-10-CM | POA: Diagnosis not present

## 2021-06-12 DIAGNOSIS — I3481 Nonrheumatic mitral (valve) annulus calcification: Secondary | ICD-10-CM | POA: Insufficient documentation

## 2021-06-12 DIAGNOSIS — Z170 Estrogen receptor positive status [ER+]: Secondary | ICD-10-CM | POA: Diagnosis not present

## 2021-06-12 DIAGNOSIS — C50411 Malignant neoplasm of upper-outer quadrant of right female breast: Secondary | ICD-10-CM | POA: Diagnosis not present

## 2021-06-12 DIAGNOSIS — Z79899 Other long term (current) drug therapy: Secondary | ICD-10-CM

## 2021-06-12 DIAGNOSIS — Z01818 Encounter for other preprocedural examination: Secondary | ICD-10-CM | POA: Diagnosis not present

## 2021-06-12 DIAGNOSIS — Z5181 Encounter for therapeutic drug level monitoring: Secondary | ICD-10-CM | POA: Diagnosis not present

## 2021-06-12 DIAGNOSIS — Z0189 Encounter for other specified special examinations: Secondary | ICD-10-CM

## 2021-06-12 LAB — ECHOCARDIOGRAM COMPLETE
AR max vel: 2.19 cm2
AV Peak grad: 8.8 mmHg
Ao pk vel: 1.48 m/s
Area-P 1/2: 3.03 cm2
Calc EF: 63.4 %
S' Lateral: 2.5 cm
Single Plane A2C EF: 61.2 %
Single Plane A4C EF: 63.9 %

## 2021-06-15 ENCOUNTER — Other Ambulatory Visit (HOSPITAL_COMMUNITY): Payer: Self-pay

## 2021-06-16 ENCOUNTER — Other Ambulatory Visit (HOSPITAL_COMMUNITY): Payer: Self-pay

## 2021-06-17 ENCOUNTER — Encounter: Payer: Self-pay | Admitting: Rehabilitation

## 2021-06-17 ENCOUNTER — Ambulatory Visit: Payer: 59 | Admitting: Rehabilitation

## 2021-06-17 ENCOUNTER — Other Ambulatory Visit (HOSPITAL_COMMUNITY): Payer: Self-pay

## 2021-06-17 ENCOUNTER — Other Ambulatory Visit: Payer: Self-pay

## 2021-06-17 DIAGNOSIS — M25611 Stiffness of right shoulder, not elsewhere classified: Secondary | ICD-10-CM | POA: Diagnosis not present

## 2021-06-17 DIAGNOSIS — R42 Dizziness and giddiness: Secondary | ICD-10-CM | POA: Diagnosis not present

## 2021-06-17 DIAGNOSIS — L599 Disorder of the skin and subcutaneous tissue related to radiation, unspecified: Secondary | ICD-10-CM | POA: Diagnosis not present

## 2021-06-17 DIAGNOSIS — R293 Abnormal posture: Secondary | ICD-10-CM

## 2021-06-17 DIAGNOSIS — Z170 Estrogen receptor positive status [ER+]: Secondary | ICD-10-CM | POA: Diagnosis not present

## 2021-06-17 DIAGNOSIS — C50411 Malignant neoplasm of upper-outer quadrant of right female breast: Secondary | ICD-10-CM

## 2021-06-17 DIAGNOSIS — M25511 Pain in right shoulder: Secondary | ICD-10-CM

## 2021-06-17 NOTE — Therapy (Signed)
[facility] @ [facility] [place], [place], [address] Phone: [phone]   Fax:  [phone]  Physical Therapy Treatment  Patient Details  Name: Rachel Gaines MRN: [id number] Date of Birth: 03-12-1956 Referring Provider (PT): Dr. Lindi Adie   Encounter Date: 06/17/2021   PT End of Session - 06/17/21 1448     Visit Number 14    Number of Visits 21    Date for PT Re-Evaluation 06/24/21    PT Start Time 1400    PT Stop Time 3716    PT Time Calculation (min) 45 min    Activity Tolerance Patient tolerated treatment well    Behavior During Therapy [facility] for tasks assessed/performed             Past Medical History:  Diagnosis Date   Abnormal Pap smear of cervix 2014   ASCUS with negative HR HPV    Anemia    Breast cancer ([place]) 11/2020   Cancer ([place]) 06/2020   right breast IDC with DCIS   Cataract    Diabetes mellitus without complication ([place])    Hyperlipidemia    on medicine   Plantar fasciitis    STD (sexually transmitted disease)    HSV type II    Past Surgical History:  Procedure Laterality Date   BREAST LUMPECTOMY WITH RADIOACTIVE SEED AND SENTINEL LYMPH NODE BIOPSY Right 12/18/2020   Procedure: RIGHT BREAST LUMPECTOMY WITH RADIOACTIVE SEED AND RIGHT AXILLARY SENTINEL LYMPH NODE BIOPSY;  Surgeon: Rolm Bookbinder, MD;  Location: [facility];  Service: General;  Laterality: Right;   CATARACT EXTRACTION  2019   COLONOSCOPY     COLPOSCOPY W/ BIOPSY / CURETTAGE  02/04/1999   Chronic cervicitis   EYE SURGERY     PORTACATH PLACEMENT N/A 08/26/2020   Procedure: INSERTION PORT-A-CATH;  Surgeon: Rolm Bookbinder, MD;  Location: [facility];  Service: General;  Laterality: N/A;  START TIME OF 12:30 PM FOR 60 MINUTES ROOM 8    There were no vitals filed for this visit.   Subjective Assessment - 06/17/21 1446     Subjective I've been feeling a little dizzy again lately    Pertinent History R breast cancer,  neoadjuvant chemo, grade 2-3 DCIS ER/PR+, will need radiation and antiestrogen treatment, diabetes.  Pt had right lumpectomy with SLNB on 12/18/2020.  Radiation ended in September.    Currently in Pain? No/denies                [facility] PT Assessment - 06/17/21 0001       Assessment   Medical Diagnosis vertigo    Referring Provider (PT) Dr. Lindi Adie    Onset Date/Surgical Date 04/19/21    Hand Dominance Right                 Vestibular Assessment - 06/17/21 0001       Other Tests   Comments pt feeling "dizzy lightheaded" upon sit to supine and supine to sit but no nystagmus noted                      [facility] Adult PT Treatment/Exercise - 06/17/21 0001       Manual Therapy   Soft tissue mobilization supine to pectoralis and right lats and biceps    Passive ROM PROM right shoulder flex, scaption, abd, ER with VC's to relax - pt with increased R upper arm pain with all movements towards end range  PT Long Term Goals - 05/27/21 1416       PT LONG TERM GOAL #1   Title Pt will return to baseline shoulder ROM measurements and not demonstrate any signs of lymphedema.    Status On-going      PT LONG TERM GOAL #2   Title Pt will report decreased pain by 50% or greater    Status Achieved      PT LONG TERM GOAL #3   Title Pt will have right shoulder flexion and abd atleast 125 degrees for improved home activies    Status Achieved      PT LONG TERM GOAL #4   Title Quick dash will be no greater than 18%    Status On-going      PT LONG TERM GOAL #6   Status Achieved      PT LONG TERM GOAL #7   Status Achieved                   Plan - 06/17/21 1448     Clinical Impression Statement Pt noting some increased dizziness / lightheadedness not room spinning the past 2 days but also does have noted nasal congestion today as well.  Pt may try decongestant to see if this helps.  Continued Rt shoulder PROM/STM today  with +2 ttp biceps border near shoulder and pain with all PROM.    PT Frequency 1x / week    PT Duration 6 weeks    PT Treatment/Interventions ADLs/Self Care Home Management;Therapeutic exercise;Patient/family education;Scar mobilization;Passive range of motion;Manual lymph drainage;Manual techniques;Orthotic Fit/Training;Iontophoresis 4mg /ml Dexamethasone    PT Next Visit Plan (vestibular as needed (Rt sided BBPV))  how was foam? SoZO around Dec 28 (set up) do in therapy, cont MFR techniques to cording, cont TG soft, PROM, Right breast MLD/foam    Consulted and Agree with Plan of Care Patient             Patient will benefit from skilled therapeutic intervention in order to improve the following deficits and impairments:     Visit Diagnosis: Malignant neoplasm of upper-outer quadrant of right breast in female, estrogen receptor positive ([place])  Stiffness of right shoulder, not elsewhere classified  Abnormal posture  Dizziness and giddiness  Acute pain of right shoulder  Disorder of the skin and subcutaneous tissue related to radiation, unspecified     Problem List Patient Active Problem List   Diagnosis Date Noted   Pain due to onychomycosis of toenails of both feet 06/05/2021   Diarrhea 10/24/2020   Port-A-Cath in place 09/26/2020   Hypokalemia 09/26/2020   Malignant neoplasm of upper-outer quadrant of right breast in female, estrogen receptor positive ([place]) 08/15/2020   Personal history of COVID-19 07/04/2020   Mixed hyperlipidemia 07/04/2020   Diabetes mellitus type 2 in obese ([place]) 04/28/2019   Precordial pain 04/28/2019   DOE (dyspnea on exertion) 04/28/2019   Tendonitis, Achilles, right 01/17/2018   Heel pain 09/26/2015   Plantar fasciitis of right foot 10/25/2014   Equinus deformity of foot, acquired 10/25/2014   Tarsal tunnel syndrome 10/25/2014   Deformity of metatarsal bone of right foot 10/25/2014   Genital herpes, unspecified 12/21/2012   DIABETES  MELLITUS II, UNCOMPLICATED 57/84/6962   OBESITY, NOS 08/25/2006   ANEMIA, ACUTE BLOOD LOSS 08/25/2006   DEPRESSION, MAJOR, RECURRENT 08/25/2006   HEADACHE, UNSPECIFIED 08/25/2006    Stark Bray, PT 06/17/2021, 2:50 PM  [facility] @ [place] [address] [place],  [place], [address] Phone: [phone]   Fax:  [phone]  Name: Rachel Gaines MRN: [id number] Date of Birth: January 27, 1956

## 2021-06-19 ENCOUNTER — Other Ambulatory Visit: Payer: Self-pay

## 2021-06-19 ENCOUNTER — Inpatient Hospital Stay: Payer: 59

## 2021-06-19 VITALS — BP 129/72 | HR 96 | Temp 98.5°F | Resp 18 | Wt 162.5 lb

## 2021-06-19 DIAGNOSIS — Z170 Estrogen receptor positive status [ER+]: Secondary | ICD-10-CM | POA: Diagnosis not present

## 2021-06-19 DIAGNOSIS — Z801 Family history of malignant neoplasm of trachea, bronchus and lung: Secondary | ICD-10-CM | POA: Diagnosis not present

## 2021-06-19 DIAGNOSIS — Z880 Allergy status to penicillin: Secondary | ICD-10-CM | POA: Diagnosis not present

## 2021-06-19 DIAGNOSIS — Z5112 Encounter for antineoplastic immunotherapy: Secondary | ICD-10-CM | POA: Diagnosis not present

## 2021-06-19 DIAGNOSIS — T451X5D Adverse effect of antineoplastic and immunosuppressive drugs, subsequent encounter: Secondary | ICD-10-CM | POA: Diagnosis not present

## 2021-06-19 DIAGNOSIS — C50411 Malignant neoplasm of upper-outer quadrant of right female breast: Secondary | ICD-10-CM | POA: Diagnosis not present

## 2021-06-19 DIAGNOSIS — R197 Diarrhea, unspecified: Secondary | ICD-10-CM | POA: Diagnosis not present

## 2021-06-19 DIAGNOSIS — G620 Drug-induced polyneuropathy: Secondary | ICD-10-CM | POA: Diagnosis not present

## 2021-06-19 DIAGNOSIS — Z923 Personal history of irradiation: Secondary | ICD-10-CM | POA: Diagnosis not present

## 2021-06-19 MED ORDER — SODIUM CHLORIDE 0.9% FLUSH
10.0000 mL | INTRAVENOUS | Status: DC | PRN
  Administered 2021-06-19: 12:00:00 10 mL

## 2021-06-19 MED ORDER — SODIUM CHLORIDE 0.9 % IV SOLN: Freq: Once | INTRAVENOUS | Status: AC

## 2021-06-19 MED ORDER — TRASTUZUMAB-ANNS CHEMO 150 MG IV SOLR
6.0000 mg/kg | Freq: Once | INTRAVENOUS | Status: AC
  Administered 2021-06-19: 12:00:00 462 mg via INTRAVENOUS
  Filled 2021-06-19: qty 22

## 2021-06-19 MED ORDER — DIPHENHYDRAMINE HCL 25 MG PO CAPS
50.0000 mg | ORAL_CAPSULE | Freq: Once | ORAL | Status: AC
  Administered 2021-06-19: 11:00:00 50 mg via ORAL
  Filled 2021-06-19: qty 2

## 2021-06-19 MED ORDER — ACETAMINOPHEN 325 MG PO TABS
650.0000 mg | ORAL_TABLET | Freq: Once | ORAL | Status: AC
  Administered 2021-06-19: 11:00:00 650 mg via ORAL
  Filled 2021-06-19: qty 2

## 2021-06-19 MED ORDER — HEPARIN SOD (PORK) LOCK FLUSH 100 UNIT/ML IV SOLN
500.0000 [IU] | Freq: Once | INTRAVENOUS | Status: AC | PRN
  Administered 2021-06-19: 12:00:00 500 [IU]

## 2021-06-19 NOTE — Patient Instructions (Signed)
[facility]  Discharge Instructions: Thank you for choosing [facility] to provide your oncology and hematology care.   If you have a lab appointment with the Cancer Center, please go directly to the Cancer Center and check in at the registration area.   Wear comfortable clothing and clothing appropriate for easy access to any Portacath or PICC line.   We strive to give you quality time with your provider. You may need to reschedule your appointment if you arrive late (15 or more minutes).  Arriving late affects you and other patients whose appointments are after yours.  Also, if you miss three or more appointments without notifying the office, you may be dismissed from the clinic at the provider's discretion.      For prescription refill requests, have your pharmacy contact our office and allow 72 hours for refills to be completed.    Today you received the following chemotherapy and/or immunotherapy agents : Herceptin     To help prevent nausea and vomiting after your treatment, we encourage you to take your nausea medication as directed.  BELOW ARE SYMPTOMS THAT SHOULD BE REPORTED IMMEDIATELY: *FEVER GREATER THAN 100.4 F (38 C) OR HIGHER *CHILLS OR SWEATING *NAUSEA AND VOMITING THAT IS NOT CONTROLLED WITH YOUR NAUSEA MEDICATION *UNUSUAL SHORTNESS OF BREATH *UNUSUAL BRUISING OR BLEEDING *URINARY PROBLEMS (pain or burning when urinating, or frequent urination) *BOWEL PROBLEMS (unusual diarrhea, constipation, pain near the anus) TENDERNESS IN MOUTH AND THROAT WITH OR WITHOUT PRESENCE OF ULCERS (sore throat, sores in mouth, or a toothache) UNUSUAL RASH, SWELLING OR PAIN  UNUSUAL VAGINAL DISCHARGE OR ITCHING   Items with * indicate a potential emergency and should be followed up as soon as possible or go to the Emergency Department if any problems should occur.  Please show the CHEMOTHERAPY ALERT CARD or IMMUNOTHERAPY ALERT CARD at check-in to  the Emergency Department and triage nurse.  Should you have questions after your visit or need to cancel or reschedule your appointment, please contact [facility]  Dept: [phone]  and follow the prompts.  Office hours are 8:00 a.m. to 4:30 p.m. Monday - Friday. Please note that voicemails left after 4:00 p.m. may not be returned until the following business day.  We are closed weekends and major holidays. You have access to a nurse at all times for urgent questions. Please call the main number to the clinic Dept: [phone] and follow the prompts.   For any non-urgent questions, you may also contact your provider using MyChart. We now offer e-Visits for anyone 18 and older to request care online for non-urgent symptoms. For details visit [email or web address].   Also download the MyChart app! Go to the app store, search "MyChart", open the app, select [facility], and log in with your MyChart username and password.  Due to Covid, a mask is required upon entering the hospital/clinic. If you do not have a mask, one will be given to you upon arrival. For doctor visits, patients may have 1 support person aged 18 or older with them. For treatment visits, patients cannot have anyone with them due to current Covid guidelines and our immunocompromised population.   

## 2021-06-24 ENCOUNTER — Ambulatory Visit: Payer: 59 | Admitting: Rehabilitation

## 2021-06-24 ENCOUNTER — Other Ambulatory Visit: Payer: Self-pay

## 2021-06-24 ENCOUNTER — Encounter: Payer: Self-pay | Admitting: Rehabilitation

## 2021-06-24 DIAGNOSIS — M25511 Pain in right shoulder: Secondary | ICD-10-CM

## 2021-06-24 DIAGNOSIS — M25611 Stiffness of right shoulder, not elsewhere classified: Secondary | ICD-10-CM

## 2021-06-24 DIAGNOSIS — R293 Abnormal posture: Secondary | ICD-10-CM | POA: Diagnosis not present

## 2021-06-24 DIAGNOSIS — R42 Dizziness and giddiness: Secondary | ICD-10-CM | POA: Diagnosis not present

## 2021-06-24 DIAGNOSIS — Z170 Estrogen receptor positive status [ER+]: Secondary | ICD-10-CM | POA: Diagnosis not present

## 2021-06-24 DIAGNOSIS — L599 Disorder of the skin and subcutaneous tissue related to radiation, unspecified: Secondary | ICD-10-CM | POA: Diagnosis not present

## 2021-06-24 DIAGNOSIS — C50411 Malignant neoplasm of upper-outer quadrant of right female breast: Secondary | ICD-10-CM | POA: Diagnosis not present

## 2021-06-24 NOTE — Therapy (Signed)
[facility] @ [place], [place], [address] Phone: [phone]   Fax:  [phone]  Physical Therapy Treatment  Patient Details  Name: Rachel Gaines MRN: [id number] Date of Birth: Apr 30, 1956 Referring Provider (PT): Dr. Lindi Adie   Encounter Date: 06/24/2021   PT End of Session - 06/24/21 1131     Visit Number 15    Number of Visits 21    Date for PT Re-Evaluation 07/22/21    PT Start Time 9381    PT Stop Time 1125    PT Time Calculation (min) 40 min    Activity Tolerance Patient tolerated treatment well    Behavior During Therapy [facility] for tasks assessed/performed             Past Medical History:  Diagnosis Date   Abnormal Pap smear of cervix 2014   ASCUS with negative HR HPV    Anemia    Breast cancer ([place]) 11/2020   Cancer ([place]) 06/2020   right breast IDC with DCIS   Cataract    Diabetes mellitus without complication ([place])    Hyperlipidemia    on medicine   Plantar fasciitis    STD (sexually transmitted disease)    HSV type II    Past Surgical History:  Procedure Laterality Date   BREAST LUMPECTOMY WITH RADIOACTIVE SEED AND SENTINEL LYMPH NODE BIOPSY Right 12/18/2020   Procedure: RIGHT BREAST LUMPECTOMY WITH RADIOACTIVE SEED AND RIGHT AXILLARY SENTINEL LYMPH NODE BIOPSY;  Surgeon: Rolm Bookbinder, MD;  Location: [facility];  Service: General;  Laterality: Right;   CATARACT EXTRACTION  2019   COLONOSCOPY     COLPOSCOPY W/ BIOPSY / CURETTAGE  02/04/1999   Chronic cervicitis   EYE SURGERY     PORTACATH PLACEMENT N/A 08/26/2020   Procedure: INSERTION PORT-A-CATH;  Surgeon: Rolm Bookbinder, MD;  Location: [facility];  Service: General;  Laterality: N/A;  START TIME OF 12:30 PM FOR 60 MINUTES ROOM 8    There were no vitals filed for this visit.   Subjective Assessment - 06/24/21 1042     Subjective I haven't been so dizzy now.  Still congested    Pertinent History R breast cancer,  neoadjuvant chemo, grade 2-3 DCIS ER/PR+, will need radiation and antiestrogen treatment, diabetes.  Pt had right lumpectomy with SLNB on 12/18/2020.  Radiation ended in September.    Currently in Pain? No/denies                [facility] PT Assessment - 06/24/21 0001       AROM   Right Shoulder Flexion 120 Degrees   pectoralis pain   Right Shoulder ABduction 120 Degrees   pectoralis pain                          [facility] Adult PT Treatment/Exercise - 06/24/21 0001       Shoulder Exercises: Supine   Other Supine Exercises AA shoulder flexion x 3 10 with dowel      Shoulder Exercises: Standing   Other Standing Exercises row red x 5 pt reporting that is all she can do due to pain, yellow bil ER x 10, bicep curls 2# x 10      Shoulder Exercises: Pulleys   Flexion 2 minutes    ABduction 2 minutes      Shoulder Exercises: Therapy Ball   Flexion Both;5 reps      Manual Therapy   Soft  tissue mobilization supine to pectoralis and right lats and biceps    Passive ROM PROM right shoulder flex, scaption, abd, ER with VC's to relax - pt with increased R upper arm pain with all movements towards end range                          PT Long Term Goals - 05/27/21 1416       PT LONG TERM GOAL #1   Title Pt will return to baseline shoulder ROM measurements and not demonstrate any signs of lymphedema.    Status On-going      PT LONG TERM GOAL #2   Title Pt will report decreased pain by 50% or greater    Status Achieved      PT LONG TERM GOAL #3   Title Pt will have right shoulder flexion and abd atleast 125 degrees for improved home activies    Status Achieved      PT LONG TERM GOAL #4   Title Quick dash will be no greater than 18%    Status On-going      PT LONG TERM GOAL #6   Status Achieved      PT LONG TERM GOAL #7   Status Achieved                   Plan - 06/24/21 1133     Clinical Impression Statement Not as dizzy lately.  Pt  demonstrates less AROM today with more pain in the lateral breast, axilla, and pectoralis noting it had been more sore recently.  Pain still seems like pain of muscle pull and guarding as pt does more AAROM in supine compared to PROM    Rehab Potential Good    PT Frequency 1x / week    PT Duration 4 weeks    PT Treatment/Interventions ADLs/Self Care Home Management;Therapeutic exercise;Patient/family education;Scar mobilization;Passive range of motion;Manual lymph drainage;Manual techniques;Orthotic Fit/Training;Iontophoresis 4mg /ml Dexamethasone    PT Next Visit Plan (vestibular as needed (Rt sided BBPV))  how was foam? SoZO around Dec 28 (set up) do in therapy, cont MFR techniques to cording, cont TG soft, PROM, Right breast MLD/foam    Consulted and Agree with Plan of Care Patient             Patient will benefit from skilled therapeutic intervention in order to improve the following deficits and impairments:  Postural dysfunction, Decreased knowledge of precautions, Pain, Decreased scar mobility, Decreased range of motion, Decreased strength, Impaired UE functional use, Dizziness  Visit Diagnosis: Malignant neoplasm of upper-outer quadrant of right breast in female, estrogen receptor positive ([facility])  Stiffness of right shoulder, not elsewhere classified  Abnormal posture  Dizziness and giddiness  Acute pain of right shoulder  Disorder of the skin and subcutaneous tissue related to radiation, unspecified     Problem List Patient Active Problem List   Diagnosis Date Noted   Pain due to onychomycosis of toenails of both feet 06/05/2021   Diarrhea 10/24/2020   Port-A-Cath in place 09/26/2020   Hypokalemia 09/26/2020   Malignant neoplasm of upper-outer quadrant of right breast in female, estrogen receptor positive ([place]) 08/15/2020   Personal history of COVID-19 07/04/2020   Mixed hyperlipidemia 07/04/2020   Diabetes mellitus type 2 in obese ([place]) 04/28/2019   Precordial pain  04/28/2019   DOE (dyspnea on exertion) 04/28/2019   Tendonitis, Achilles, right 01/17/2018   Heel pain 09/26/2015   Plantar fasciitis of right foot  10/25/2014   Equinus deformity of foot, acquired 10/25/2014   Tarsal tunnel syndrome 10/25/2014   Deformity of metatarsal bone of right foot 10/25/2014   Genital herpes, unspecified 12/21/2012   DIABETES MELLITUS II, UNCOMPLICATED 29/56/2130   OBESITY, NOS 08/25/2006   ANEMIA, ACUTE BLOOD LOSS 08/25/2006   DEPRESSION, MAJOR, RECURRENT 08/25/2006   HEADACHE, UNSPECIFIED 08/25/2006    Stark Bray, PT 06/24/2021, 11:38 AM  [facility] @ [place], [place], [address] Phone: [phone]   Fax:  [phone]  Name: Kim Oki MRN: [id number] Date of Birth: 28-Jan-1956

## 2021-07-01 ENCOUNTER — Other Ambulatory Visit (HOSPITAL_COMMUNITY): Payer: Self-pay

## 2021-07-01 ENCOUNTER — Other Ambulatory Visit: Payer: Self-pay

## 2021-07-08 ENCOUNTER — Encounter: Payer: Self-pay | Admitting: Rehabilitation

## 2021-07-08 ENCOUNTER — Other Ambulatory Visit: Payer: Self-pay

## 2021-07-08 ENCOUNTER — Ambulatory Visit: Payer: 59 | Attending: Hematology and Oncology | Admitting: Rehabilitation

## 2021-07-08 DIAGNOSIS — M25611 Stiffness of right shoulder, not elsewhere classified: Secondary | ICD-10-CM

## 2021-07-08 DIAGNOSIS — R293 Abnormal posture: Secondary | ICD-10-CM | POA: Diagnosis not present

## 2021-07-08 DIAGNOSIS — M25511 Pain in right shoulder: Secondary | ICD-10-CM | POA: Diagnosis not present

## 2021-07-08 DIAGNOSIS — R42 Dizziness and giddiness: Secondary | ICD-10-CM

## 2021-07-08 DIAGNOSIS — C50411 Malignant neoplasm of upper-outer quadrant of right female breast: Secondary | ICD-10-CM | POA: Diagnosis not present

## 2021-07-08 DIAGNOSIS — Z170 Estrogen receptor positive status [ER+]: Secondary | ICD-10-CM | POA: Insufficient documentation

## 2021-07-08 DIAGNOSIS — L599 Disorder of the skin and subcutaneous tissue related to radiation, unspecified: Secondary | ICD-10-CM

## 2021-07-08 NOTE — Therapy (Signed)
[facility] @ [place], [place], [address] Phone: [phone]   Fax:  [phone]  Physical Therapy Treatment  Patient Details  Name: Rachel Gaines MRN: [id number] Date of Birth: 1955-09-18 Referring Provider (PT): Dr. Lindi Adie   Encounter Date: 07/08/2021   PT End of Session - 07/08/21 1446     Visit Number 16    Number of Visits 21    Date for PT Re-Evaluation 07/22/21    PT Start Time 1400    PT Stop Time 4765    PT Time Calculation (min) 45 min    Activity Tolerance Patient tolerated treatment well    Behavior During Therapy [facility] for tasks assessed/performed             Past Medical History:  Diagnosis Date   Abnormal Pap smear of cervix 2014   ASCUS with negative HR HPV    Anemia    Breast cancer ([place]) 11/2020   Cancer ([place]) 06/2020   right breast IDC with DCIS   Cataract    Diabetes mellitus without complication ([place])    Hyperlipidemia    on medicine   Plantar fasciitis    STD (sexually transmitted disease)    HSV type II    Past Surgical History:  Procedure Laterality Date   BREAST LUMPECTOMY WITH RADIOACTIVE SEED AND SENTINEL LYMPH NODE BIOPSY Right 12/18/2020   Procedure: RIGHT BREAST LUMPECTOMY WITH RADIOACTIVE SEED AND RIGHT AXILLARY SENTINEL LYMPH NODE BIOPSY;  Surgeon: Rolm Bookbinder, MD;  Location: [facility];  Service: General;  Laterality: Right;   CATARACT EXTRACTION  2019   COLONOSCOPY     COLPOSCOPY W/ BIOPSY / CURETTAGE  02/04/1999   Chronic cervicitis   EYE SURGERY     PORTACATH PLACEMENT N/A 08/26/2020   Procedure: INSERTION PORT-A-CATH;  Surgeon: Rolm Bookbinder, MD;  Location: [facility];  Service: General;  Laterality: N/A;  START TIME OF 12:30 PM FOR 60 MINUTES ROOM 8    There were no vitals filed for this visit.   Subjective Assessment - 07/08/21 1401     Subjective My arm has been more sore x 3 days    Pertinent History R breast cancer, neoadjuvant  chemo, grade 2-3 DCIS ER/PR+, will need radiation and antiestrogen treatment, diabetes.  Pt had right lumpectomy with SLNB on 12/18/2020.  Radiation ended in September.                [facility] PT Assessment - 07/08/21 0001       AROM   Right Shoulder Flexion 125 Degrees   pain   Right Shoulder ABduction 135 Degrees   pain               L-DEX FLOWSHEETS - 07/08/21 1400       L-DEX LYMPHEDEMA SCREENING   Measurement Type Unilateral    L-DEX MEASUREMENT EXTREMITY Upper Extremity    POSITION  Standing    DOMINANT SIDE Right    At Risk Side Right    BASELINE SCORE (UNILATERAL) -4.6    L-DEX SCORE (UNILATERAL) -6.8    VALUE CHANGE (UNILAT) -2.2                       [facility] Adult PT Treatment/Exercise - 07/08/21 0001       Shoulder Exercises: Pulleys   Flexion 2 minutes    ABduction 2 minutes      Shoulder Exercises: Therapy Ball   Flexion Both;5 reps  Manual Therapy   Soft tissue mobilization supine to pectoralis and right lats and biceps with most tension and pain today in medial bicep towards insertion    Passive ROM PROM right shoulder flex, scaption, abd, ER with VC's to relax - pt with increased R upper arm pain with all movements towards end range                          PT Long Term Goals - 07/08/21 1442       PT LONG TERM GOAL #1   Title Pt will return to baseline shoulder ROM measurements and not demonstrate any signs of lymphedema.    Baseline Flex: 160,  Abd: 173    Status On-going      PT LONG TERM GOAL #2   Title Pt will report decreased pain by 50% or greater    Status Achieved      PT LONG TERM GOAL #3   Title Pt will have right shoulder flexion and abd atleast 125 degrees for improved home activies    Status Achieved      PT LONG TERM GOAL #4   Title Quick dash will be no greater than 18%    Status On-going      PT LONG TERM GOAL #5   Title Pt will attend ABC class for education in exercises and  lymphedema    Baseline in person discussion    Status Achieved      PT LONG TERM GOAL #6   Title Pt will report no room spinning with positon changes    Status Achieved      PT LONG TERM GOAL #7   Title pt will be ind with self exercises including brand daroff and and VOR as needed    Status Achieved                   Plan - 07/08/21 1446     Clinical Impression Statement Pt shows improvements in AROM but still limited and with pain mainly in the bicep region today.  Focused on STM to the upper bicep region with PROM and this was most tight in overhead radiation type position.  Repeated SOZO which had no change so no signs of lymphedema.    PT Frequency 1x / week    PT Duration 4 weeks    PT Treatment/Interventions ADLs/Self Care Home Management;Therapeutic exercise;Patient/family education;Scar mobilization;Passive range of motion;Manual lymph drainage;Manual techniques;Orthotic Fit/Training;Iontophoresis 4mg /ml Dexamethasone    PT Next Visit Plan (vestibular as needed (Rt sided BBPV)) cont MFR/STM Rt upper quadrant to improve shoulder mobility , PROM, postural TE, Right breast MLD PRN    Consulted and Agree with Plan of Care Patient             Patient will benefit from skilled therapeutic intervention in order to improve the following deficits and impairments:     Visit Diagnosis: Malignant neoplasm of upper-outer quadrant of right breast in female, estrogen receptor positive ([facility])  Stiffness of right shoulder, not elsewhere classified  Abnormal posture  Dizziness and giddiness  Acute pain of right shoulder  Disorder of the skin and subcutaneous tissue related to radiation, unspecified     Problem List Patient Active Problem List   Diagnosis Date Noted   Pain due to onychomycosis of toenails of both feet 06/05/2021   Diarrhea 10/24/2020   Port-A-Cath in place 09/26/2020   Hypokalemia 09/26/2020   Malignant neoplasm of upper-outer quadrant of  right  breast in female, estrogen receptor positive ([place]) 08/15/2020   Personal history of COVID-19 07/04/2020   Mixed hyperlipidemia 07/04/2020   Diabetes mellitus type 2 in obese ([place]) 04/28/2019   Precordial pain 04/28/2019   DOE (dyspnea on exertion) 04/28/2019   Tendonitis, Achilles, right 01/17/2018   Heel pain 09/26/2015   Plantar fasciitis of right foot 10/25/2014   Equinus deformity of foot, acquired 10/25/2014   Tarsal tunnel syndrome 10/25/2014   Deformity of metatarsal bone of right foot 10/25/2014   Genital herpes, unspecified 12/21/2012   DIABETES MELLITUS II, UNCOMPLICATED 76/80/8811   OBESITY, NOS 08/25/2006   ANEMIA, ACUTE BLOOD LOSS 08/25/2006   DEPRESSION, MAJOR, RECURRENT 08/25/2006   HEADACHE, UNSPECIFIED 08/25/2006    Stark Bray, PT 07/08/2021, 2:53 PM  [facility] @ [place], [place], [address] Phone: [phone]   Fax:  [phone]  Name: Rachel Gaines MRN: [id number] Date of Birth: September 06, 1955

## 2021-07-09 NOTE — Assessment & Plan Note (Signed)
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  Neoadjuvant chemotherapy with Taxol and Herceptin weekly x12 followed by Herceptin maintenance °2. breast conserving surgery with some [place] biopsy °3.  Adjuvant radiation °4.  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 Diarrhea: Secondary to Herceptin °2. Mild fatigue °3. Hypokalemia-monitoring closely °4. Decreased appetite/weight loss °5. Peripheral Neuropathy: Patient was on clinical trial with PEA and had a remarkable improvement in symptoms.  She is currently on gabapentin. °  °Leg cramps: Possibly due to letrozole. °  °MRI breast 11/17/2020: Right breast malignancy 2.2 cm, 5 mm FA (MRI biopsy: Benign fat necrosis) °  °Echocardiogram: 02/23/2021: EF 65 to 70% °Patient did not experience any adverse effects to participation in the clinical trial with PEA. °Return to clinic every 3 weeks for Herceptin every 6 weeks to follow-up with labs.  She will complete Herceptin by end of February. °  °

## 2021-07-10 ENCOUNTER — Inpatient Hospital Stay: Payer: 59

## 2021-07-10 ENCOUNTER — Inpatient Hospital Stay (HOSPITAL_BASED_OUTPATIENT_CLINIC_OR_DEPARTMENT_OTHER): Payer: 59 | Admitting: Hematology and Oncology

## 2021-07-10 ENCOUNTER — Other Ambulatory Visit: Payer: Self-pay

## 2021-07-10 ENCOUNTER — Inpatient Hospital Stay: Payer: 59 | Attending: Hematology and Oncology

## 2021-07-10 ENCOUNTER — Other Ambulatory Visit (HOSPITAL_COMMUNITY): Payer: Self-pay

## 2021-07-10 DIAGNOSIS — C50411 Malignant neoplasm of upper-outer quadrant of right female breast: Secondary | ICD-10-CM | POA: Insufficient documentation

## 2021-07-10 DIAGNOSIS — Z880 Allergy status to penicillin: Secondary | ICD-10-CM | POA: Insufficient documentation

## 2021-07-10 DIAGNOSIS — Z170 Estrogen receptor positive status [ER+]: Secondary | ICD-10-CM | POA: Diagnosis not present

## 2021-07-10 DIAGNOSIS — Z801 Family history of malignant neoplasm of trachea, bronchus and lung: Secondary | ICD-10-CM | POA: Diagnosis not present

## 2021-07-10 DIAGNOSIS — G620 Drug-induced polyneuropathy: Secondary | ICD-10-CM | POA: Insufficient documentation

## 2021-07-10 DIAGNOSIS — Z923 Personal history of irradiation: Secondary | ICD-10-CM | POA: Insufficient documentation

## 2021-07-10 DIAGNOSIS — Z5112 Encounter for antineoplastic immunotherapy: Secondary | ICD-10-CM | POA: Diagnosis not present

## 2021-07-10 DIAGNOSIS — T451X5D Adverse effect of antineoplastic and immunosuppressive drugs, subsequent encounter: Secondary | ICD-10-CM | POA: Diagnosis not present

## 2021-07-10 DIAGNOSIS — R197 Diarrhea, unspecified: Secondary | ICD-10-CM | POA: Diagnosis not present

## 2021-07-10 DIAGNOSIS — Z95828 Presence of other vascular implants and grafts: Secondary | ICD-10-CM

## 2021-07-10 LAB — CBC WITH DIFFERENTIAL (CANCER CENTER ONLY)
Abs Immature Granulocytes: 0.01 10*3/uL (ref 0.00–0.07)
Basophils Absolute: 0 10*3/uL (ref 0.0–0.1)
Basophils Relative: 0 %
Eosinophils Absolute: 0.1 10*3/uL (ref 0.0–0.5)
Eosinophils Relative: 1 %
HCT: 40.8 % (ref 36.0–46.0)
Hemoglobin: 13.5 g/dL (ref 12.0–15.0)
Immature Granulocytes: 0 %
Lymphocytes Relative: 26 %
Lymphs Abs: 1.7 10*3/uL (ref 0.7–4.0)
MCH: 27.6 pg (ref 26.0–34.0)
MCHC: 33.1 g/dL (ref 30.0–36.0)
MCV: 83.3 fL (ref 80.0–100.0)
Monocytes Absolute: 0.4 10*3/uL (ref 0.1–1.0)
Monocytes Relative: 6 %
Neutro Abs: 4.5 10*3/uL (ref 1.7–7.7)
Neutrophils Relative %: 67 %
Platelet Count: 243 10*3/uL (ref 150–400)
RBC: 4.9 MIL/uL (ref 3.87–5.11)
RDW: 13.4 % (ref 11.5–15.5)
WBC Count: 6.7 10*3/uL (ref 4.0–10.5)
nRBC: 0 % (ref 0.0–0.2)

## 2021-07-10 LAB — CMP (CANCER CENTER ONLY)
ALT: 11 U/L (ref 0–44)
AST: 12 U/L — ABNORMAL LOW (ref 15–41)
Albumin: 3.9 g/dL (ref 3.5–5.0)
Alkaline Phosphatase: 92 U/L (ref 38–126)
Anion gap: 6 (ref 5–15)
BUN: 9 mg/dL (ref 8–23)
CO2: 30 mmol/L (ref 22–32)
Calcium: 10 mg/dL (ref 8.9–10.3)
Chloride: 107 mmol/L (ref 98–111)
Creatinine: 0.74 mg/dL (ref 0.44–1.00)
GFR, Estimated: >60 mL/min (ref >60)
Glucose, Bld: 211 mg/dL — ABNORMAL HIGH (ref 70–99)
Potassium: 3.9 mmol/L (ref 3.5–5.1)
Sodium: 143 mmol/L (ref 135–145)
Total Bilirubin: 1.1 mg/dL (ref 0.3–1.2)
Total Protein: 7.6 g/dL (ref 6.5–8.1)

## 2021-07-10 MED ORDER — SODIUM CHLORIDE 0.9% FLUSH
10.0000 mL | INTRAVENOUS | Status: DC | PRN
  Administered 2021-07-10: 10 mL

## 2021-07-10 MED ORDER — ALTEPLASE 2 MG IJ SOLR
2.0000 mg | Freq: Once | INTRAMUSCULAR | Status: AC
  Administered 2021-07-10: 2 mg

## 2021-07-10 MED ORDER — BENZONATATE 200 MG PO CAPS
200.0000 mg | ORAL_CAPSULE | Freq: Three times a day (TID) | ORAL | 0 refills | Status: DC | PRN
  Filled 2021-07-10: qty 30, 10d supply, fill #0

## 2021-07-10 MED ORDER — DIPHENHYDRAMINE HCL 25 MG PO CAPS
50.0000 mg | ORAL_CAPSULE | Freq: Once | ORAL | Status: AC
  Administered 2021-07-10: 50 mg via ORAL
  Filled 2021-07-10: qty 2

## 2021-07-10 MED ORDER — SODIUM CHLORIDE 0.9% FLUSH
10.0000 mL | Freq: Once | INTRAVENOUS | Status: AC
  Administered 2021-07-10: 10 mL

## 2021-07-10 MED ORDER — HEPARIN SOD (PORK) LOCK FLUSH 100 UNIT/ML IV SOLN
500.0000 [IU] | Freq: Once | INTRAVENOUS | Status: AC | PRN
  Administered 2021-07-10: 500 [IU]

## 2021-07-10 MED ORDER — SODIUM CHLORIDE 0.9 % IV SOLN: Freq: Once | INTRAVENOUS | Status: AC

## 2021-07-10 MED ORDER — TRASTUZUMAB-ANNS CHEMO 150 MG IV SOLR
6.0000 mg/kg | Freq: Once | INTRAVENOUS | Status: AC
  Administered 2021-07-10: 462 mg via INTRAVENOUS
  Filled 2021-07-10: qty 22

## 2021-07-10 MED ORDER — ACETAMINOPHEN 325 MG PO TABS
650.0000 mg | ORAL_TABLET | Freq: Once | ORAL | Status: AC
  Administered 2021-07-10: 650 mg via ORAL
  Filled 2021-07-10: qty 2

## 2021-07-10 NOTE — Patient Instructions (Signed)
[facility]  Discharge Instructions: ?Thank you for choosing [facility] to provide your oncology and hematology care.  ? ?If you have a lab appointment with the Cancer Center, please go directly to the Cancer Center and check in at the registration area. ?  ?Wear comfortable clothing and clothing appropriate for easy access to any Portacath or PICC line.  ? ?We strive to give you quality time with your provider. You may need to reschedule your appointment if you arrive late (15 or more minutes).  Arriving late affects you and other patients whose appointments are after yours.  Also, if you miss three or more appointments without notifying the office, you may be dismissed from the clinic at the provider?s discretion.    ?  ?For prescription refill requests, have your pharmacy contact our office and allow 72 hours for refills to be completed.   ? ?Today you received the following chemotherapy and/or immunotherapy agents: Trastuzumab    ?  ?To help prevent nausea and vomiting after your treatment, we encourage you to take your nausea medication as directed. ? ?BELOW ARE SYMPTOMS THAT SHOULD BE REPORTED IMMEDIATELY: ?*FEVER GREATER THAN 100.4 F (38 ?C) OR HIGHER ?*CHILLS OR SWEATING ?*NAUSEA AND VOMITING THAT IS NOT CONTROLLED WITH YOUR NAUSEA MEDICATION ?*UNUSUAL SHORTNESS OF BREATH ?*UNUSUAL BRUISING OR BLEEDING ?*URINARY PROBLEMS (pain or burning when urinating, or frequent urination) ?*BOWEL PROBLEMS (unusual diarrhea, constipation, pain near the anus) ?TENDERNESS IN MOUTH AND THROAT WITH OR WITHOUT PRESENCE OF ULCERS (sore throat, sores in mouth, or a toothache) ?UNUSUAL RASH, SWELLING OR PAIN  ?UNUSUAL VAGINAL DISCHARGE OR ITCHING  ? ?Items with * indicate a potential emergency and should be followed up as soon as possible or go to the Emergency Department if any problems should occur. ? ?Please show the CHEMOTHERAPY ALERT CARD or IMMUNOTHERAPY ALERT CARD at check-in  to the Emergency Department and triage nurse. ? ?Should you have questions after your visit or need to cancel or reschedule your appointment, please contact [facility]  Dept: [phone]  and follow the prompts.  Office hours are 8:00 a.m. to 4:30 p.m. Monday - Friday. Please note that voicemails left after 4:00 p.m. may not be returned until the following business day.  We are closed weekends and major holidays. You have access to a nurse at all times for urgent questions. Please call the main number to the clinic Dept: [phone] and follow the prompts. ? ? ?For any non-urgent questions, you may also contact your provider using MyChart. We now offer e-Visits for anyone 18 and older to request care online for non-urgent symptoms. For details visit [email or web address]. ?  ?Also download the MyChart app! Go to the app store, search "MyChart", open the app, select [facility], and log in with your MyChart username and password. ? ?Due to Covid, a mask is required upon entering the hospital/clinic. If you do not have a mask, one will be given to you upon arrival. For doctor visits, patients may have 1 support person aged 18 or older with them. For treatment visits, patients cannot have anyone with them due to current Covid guidelines and our immunocompromised population.  ? ?

## 2021-07-10 NOTE — Progress Notes (Signed)
Patient Care Team: Shon Baton, MD as PCP - General (Internal Medicine) Buford Dresser, MD as PCP - Cardiology (Cardiology) Mauro Kaufmann, RN as Oncology Nurse Navigator Rockwell Germany, RN as Oncology Nurse Navigator  DIAGNOSIS:  Encounter Diagnosis  Name Primary?   Malignant neoplasm of upper-outer quadrant of right breast in female, estrogen receptor positive (Rachel Gaines)     SUMMARY OF ONCOLOGIC HISTORY: Oncology History  Malignant neoplasm of upper-outer quadrant of right breast in female, estrogen receptor positive ([name])  08/08/2020 Initial Diagnosis   Screening detected right breast mass 2.2 cm by ultrasound upper outer quadrant biopsy: Grade 2-3 IDC with DCIS ER 60%, PR 20%, Ki-67 15%, HER-2 +3+   08/15/2020 Cancer Staging   Staging form: Breast, AJCC 8th Edition - Clinical stage from 08/15/2020: Stage IB (cT2, cN0, cM0, G3, ER+, PR+, HER2+) - Signed by Nicholas Lose, MD on 08/15/2020 Stage prefix: Initial diagnosis    09/12/2020 - 11/14/2020 Neo-Adjuvant Chemotherapy   Neoadjuvant Taxol/Herceptin x 12 weeks   09/17/2020 Surgery   Right breast lumpectomy ([facility]): IDC 2.1 cm 6 SLN negative for cancer   11/21/2020 -  Adjuvant Chemotherapy   Maintenance Herceptin   01/26/2021 - 02/20/2021 Radiation Therapy   Adjuvant radiation therapy (Dr. Lisbeth Renshaw)     CHIEF COMPLIANT: Follow-up on Herceptin and letrozole  INTERVAL HISTORY: Rachel Gaines is a 66 year old above-mentioned history of right breast cancer who is currently on maintenance Herceptin and.  She has Herceptin until the end of February.  She is tolerating it fairly well.  She has very occasional diarrhea.  Does not have any nausea or vomiting.  She is also experiencing muscle cramps.  Neuropathy symptoms which had gotten better while she was on the study drug have started to come back and she is also experiencing the cramping in the legs.  She is planning to buy the supplement PEA over-the-counter.   ALLERGIES:  is  allergic to penicillins.  MEDICATIONS:  Current Outpatient Medications  Medication Sig Dispense Refill   albuterol (VENTOLIN HFA) 108 (90 Base) MCG/ACT inhaler INHALE 1 TO 2 PUFFS BY MOUTH INTO THE LUNGS EVERY 4 TO 6 HOURS AS NEEDED FOR SHORTNESS OF BREATH/WHEEZING/COUGH URGENCY 18 g 0   benzonatate (TESSALON) 200 MG capsule Take 1 capsule (200 mg total) by mouth 3 (three) times daily as needed. 30 capsule 1   Biotin 5 MG CAPS Take 5 mg by mouth daily.     Cholecalciferol (VITAMIN D3) 125 MCG (5000 UT) CAPS Take 5,000 Units by mouth daily.     colestipol (COLESTID) 5 g granules Take 5 grams by mouth 2 (two) times daily. 500 g 12   Continuous Blood Gluc Sensor (FREESTYLE LIBRE 2 SENSOR) MISC Change every 14 days to monitor blood glucose continuosly     Continuous Blood Gluc Sensor (FREESTYLE LIBRE 2 SENSOR) MISC Change every 14 days to monitor blood sugar as directed 9 each 3   diclofenac Sodium (VOLTAREN) 1 % GEL Apply 2 g topically every evening.     diphenoxylate-atropine (LOMOTIL) 2.5-0.025 MG tablet Take 1 tablet by mouth 4 (four) times daily as needed for diarrhea or loose stools 30 tablet 0   empagliflozin (JARDIANCE) 10 MG TABS tablet Take 1 tablet (10 mg total) by mouth daily. 90 tablet 3   empagliflozin (JARDIANCE) 10 MG TABS tablet Take 1 tablet (10 mg total) by mouth daily. 90 tablet 3   FREESTYLE LITE test strip      insulin detemir (LEVEMIR) 100 UNIT/ML FlexPen INJECT  34 UNITS SUBCUTANEOUSLY TWICE DAILY 15 mL 3   Insulin Glargine (BASAGLAR KWIKPEN) 100 UNIT/ML INJECT 34 UNITS UNDER THE SKIN TWICE DAILY (Patient not taking: Reported on 12/05/2020) 45 mL 3   insulin glargine-yfgn (SEMGLEE, YFGN,) 100 UNIT/ML Pen Inject 34 Units into the skin 2 (two) times daily. 15 mL 3   insulin lispro (HUMALOG) 100 UNIT/ML KwikPen Inject 14 Units into the skin 3 (three) times daily with meals. 45 mL 3   letrozole (FEMARA) 2.5 MG tablet Take 1 tablet (2.5 mg total) by mouth daily. 90 tablet 3    lidocaine-prilocaine (EMLA) cream APPLY 1 APPLICATION TOPICALLY AS NEEDED. (Patient taking differently: Apply 1 application topically daily as needed (port access).) 30 g 0   NONFORMULARY OR COMPOUNDED ITEM [facility]:  Antiinflammatory cream - Diclofenac 3%, Baclofen 2%, Cyclobenzaprine 2%, Lidocaine 2%, dispense 120grams, apply 1-2 grams to affected area 3-4 times a day, +2refills. (Patient taking differently: [facility]:  Antiinflammatory cream - Diclofenac 3%, Baclofen 2%, Cyclobenzaprine 2%, Lidocaine 2%, dispense 120grams, apply 1-2 grams to affected area 3-4 times a day, +2refills.) 120 each 2   Pancrelipase, Lip-Prot-Amyl, (ZENPEP) 40000-126000 units CPEP Take 1 capsule by mouth in the morning and at bedtime. (Patient taking differently: Take 1 capsule by mouth 3 (three) times daily with meals.) 120 capsule    ramipril (ALTACE) 2.5 MG capsule Take 1 capsule (2.5 mg total) by mouth every other day. 45 capsule 2   rosuvastatin (CRESTOR) 40 MG tablet TAKE 1 TABLET BY MOUTH ONCE DAILY (Patient taking differently: Take 40 mg by mouth every evening.) 90 tablet 2   sitaGLIPtin (JANUVIA) 100 MG tablet Take 1 tablet (100 mg total) by mouth daily. 90 tablet 3   UNIFINE PENTIPS 31G X 8 MM MISC USE WITH RX OF LEVEMIR AND HUMALOG FOUR TIMES DAILY.     UNIFINE PENTIPS 32G X 4 MM MISC USE WITH LEVEMIR AND HUMALOG 4X A DAY DX-E11.65     valACYclovir (VALTREX) 500 MG tablet Take 1 tablet (500 mg total) by mouth 2 (two) times daily. Take for 3 days for outbreak 30 tablet 2   No current facility-administered medications for this visit.    PHYSICAL EXAMINATION: ECOG PERFORMANCE STATUS: 1 - Symptomatic but completely ambulatory  Vitals:   07/10/21 0953  BP: (!) 137/59  Pulse: 77  Resp: 17  Temp: (!) 97.2 F (36.2 C)  SpO2: 99%   Filed Weights   07/10/21 0953  Weight: 162 lb 4.8 oz (73.6 kg)     LABORATORY DATA:  I have reviewed the data as listed CMP Latest Ref Rng & Units 05/29/2021  04/20/2021 04/17/2021  Glucose 70 - 99 mg/dL 95 234(H) 221(H)  BUN 8 - 23 mg/dL 7(L) 7(L) 8  Creatinine 0.44 - 1.00 mg/dL 0.71 0.66 0.59  Sodium 135 - 145 mmol/L 145 138 139  Potassium 3.5 - 5.1 mmol/L 3.8 3.6 4.0  Chloride 98 - 111 mmol/L 110 106 106  CO2 22 - 32 mmol/L _0 Calcium 8.9 - 10.3 mg/dL 9.5 9.9 9.5  Total Protein 6.5 - 8.1 g/dL 7.4 7.7 7.5  Total Bilirubin 0.3 - 1.2 mg/dL 1.2 1.5(H) 1.2  Alkaline Phos 38 - 126 U/L 92 94 89  AST 15 - 41 U/L _1 ALT 0 - 44 U/L _2 Lab Results  Component Value Date   WBC 6.7 07/10/2021   HGB 13.5 07/10/2021   HCT 40.8 07/10/2021   MCV 83.3 07/10/2021  PLT 243 07/10/2021   NEUTROABS 4.5 07/10/2021    ASSESSMENT & PLAN:  Malignant neoplasm of upper-outer quadrant of right breast in female, estrogen receptor positive ([place]) 08/08/2020:Screening detected right breast mass 2.2 cm by ultrasound upper outer quadrant biopsy: Grade 2-3 IDC with DCIS ER 60%, PR 20%, Ki-67 15%, HER-2 +3+ T2N0 stage Ia Treatment plan: 1.  Neoadjuvant chemotherapy with Taxol and Herceptin weekly x12 followed by Herceptin maintenance 2. breast conserving surgery with some Huntington biopsy 3.  Adjuvant radiation 4.  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 Diarrhea: Secondary to Herceptin 2. Mild fatigue 3. Hypokalemia-monitoring closely 4. Decreased appetite/weight loss 5. Peripheral Neuropathy: Patient was on clinical trial with PEA and had a remarkable improvement in symptoms.  She is currently on gabapentin.  She will buy PE over-the-counter.   Leg cramps: Possibly due to letrozole.   MRI breast 11/17/2020: Right breast malignancy 2.2 cm, 5 mm FA (MRI biopsy: Benign fat necrosis)   Echocardiogram: 02/23/2021: EF 65 to 70%   Return to  clinic every 3 weeks for Herceptin every 6 weeks to follow-up with labs.  She will complete Herceptin by end of February.     No orders of the defined types were placed in this encounter.  The patient has a good understanding of the overall plan. she agrees with it. she will call with any problems that may develop before the next visit here. Total time spent: 30 mins including face to face time and time spent for planning, charting and co-ordination of care   Harriette Ohara, MD 07/10/21

## 2021-07-20 ENCOUNTER — Encounter: Payer: Self-pay | Admitting: *Deleted

## 2021-07-20 NOTE — Progress Notes (Signed)
Received call from [facility] case manger wanting to provide our office with her contact information incase pt has any future needs.  Office number [phone] ext [phone].

## 2021-07-22 ENCOUNTER — Ambulatory Visit: Payer: 59 | Admitting: Nurse Practitioner

## 2021-07-27 ENCOUNTER — Encounter: Payer: Self-pay | Admitting: *Deleted

## 2021-07-28 ENCOUNTER — Encounter: Payer: Self-pay | Admitting: Hematology and Oncology

## 2021-07-28 ENCOUNTER — Other Ambulatory Visit (HOSPITAL_COMMUNITY): Payer: Self-pay

## 2021-07-31 ENCOUNTER — Other Ambulatory Visit: Payer: Self-pay

## 2021-07-31 ENCOUNTER — Inpatient Hospital Stay (HOSPITAL_BASED_OUTPATIENT_CLINIC_OR_DEPARTMENT_OTHER): Payer: 59 | Admitting: Adult Health

## 2021-07-31 ENCOUNTER — Encounter: Payer: Self-pay | Admitting: Hematology and Oncology

## 2021-07-31 ENCOUNTER — Inpatient Hospital Stay: Payer: 59 | Attending: Hematology and Oncology

## 2021-07-31 ENCOUNTER — Encounter: Payer: Self-pay | Admitting: Adult Health

## 2021-07-31 ENCOUNTER — Other Ambulatory Visit (HOSPITAL_COMMUNITY): Payer: Self-pay

## 2021-07-31 ENCOUNTER — Ambulatory Visit (HOSPITAL_COMMUNITY)
Admission: RE | Admit: 2021-07-31 | Discharge: 2021-07-31 | Disposition: A | Discharge status: Home / Self Care | Payer: 59 | Source: Ambulatory Visit | Attending: Adult Health | Admitting: Adult Health

## 2021-07-31 VITALS — BP 133/65 | HR 79 | Temp 98.1°F | Resp 17

## 2021-07-31 VITALS — BP 160/70 | HR 76 | Temp 97.6°F | Resp 16 | Ht 64.0 in | Wt 166.5 lb

## 2021-07-31 DIAGNOSIS — C50411 Malignant neoplasm of upper-outer quadrant of right female breast: Secondary | ICD-10-CM | POA: Insufficient documentation

## 2021-07-31 DIAGNOSIS — Z880 Allergy status to penicillin: Secondary | ICD-10-CM | POA: Insufficient documentation

## 2021-07-31 DIAGNOSIS — Z170 Estrogen receptor positive status [ER+]: Secondary | ICD-10-CM

## 2021-07-31 DIAGNOSIS — Z923 Personal history of irradiation: Secondary | ICD-10-CM | POA: Insufficient documentation

## 2021-07-31 DIAGNOSIS — T451X5D Adverse effect of antineoplastic and immunosuppressive drugs, subsequent encounter: Secondary | ICD-10-CM | POA: Insufficient documentation

## 2021-07-31 DIAGNOSIS — G620 Drug-induced polyneuropathy: Secondary | ICD-10-CM | POA: Insufficient documentation

## 2021-07-31 DIAGNOSIS — R197 Diarrhea, unspecified: Secondary | ICD-10-CM | POA: Insufficient documentation

## 2021-07-31 DIAGNOSIS — Z801 Family history of malignant neoplasm of trachea, bronchus and lung: Secondary | ICD-10-CM | POA: Insufficient documentation

## 2021-07-31 DIAGNOSIS — M79601 Pain in right arm: Secondary | ICD-10-CM | POA: Diagnosis not present

## 2021-07-31 DIAGNOSIS — Z5112 Encounter for antineoplastic immunotherapy: Secondary | ICD-10-CM | POA: Insufficient documentation

## 2021-07-31 MED ORDER — HEPARIN SOD (PORK) LOCK FLUSH 100 UNIT/ML IV SOLN
500.0000 [IU] | Freq: Once | INTRAVENOUS | Status: AC | PRN
  Administered 2021-07-31: 500 [IU]

## 2021-07-31 MED ORDER — ACETAMINOPHEN 325 MG PO TABS
650.0000 mg | ORAL_TABLET | Freq: Once | ORAL | Status: AC
  Administered 2021-07-31: 650 mg via ORAL
  Filled 2021-07-31: qty 2

## 2021-07-31 MED ORDER — TRASTUZUMAB-ANNS CHEMO 150 MG IV SOLR
6.0000 mg/kg | Freq: Once | INTRAVENOUS | Status: AC
  Administered 2021-07-31: 462 mg via INTRAVENOUS
  Filled 2021-07-31: qty 22

## 2021-07-31 MED ORDER — DIPHENHYDRAMINE HCL 25 MG PO CAPS
50.0000 mg | ORAL_CAPSULE | Freq: Once | ORAL | Status: AC
  Administered 2021-07-31: 50 mg via ORAL
  Filled 2021-07-31: qty 2

## 2021-07-31 MED ORDER — SODIUM CHLORIDE 0.9 % IV SOLN: Freq: Once | INTRAVENOUS | Status: AC

## 2021-07-31 MED ORDER — SODIUM CHLORIDE 0.9% FLUSH
10.0000 mL | INTRAVENOUS | Status: DC | PRN
  Administered 2021-07-31: 10 mL

## 2021-07-31 MED ORDER — TRAMADOL HCL 50 MG PO TABS
50.0000 mg | ORAL_TABLET | Freq: Four times a day (QID) | ORAL | 0 refills | Status: DC | PRN
  Filled 2021-07-31: qty 30, 8d supply, fill #0

## 2021-07-31 NOTE — Assessment & Plan Note (Signed)
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  This is not on the side where she has her port, so we can rule out port related DVT.  However due to the persistent pain we will get an x-ray of the right humerus and also we will get a Doppler of the right arm.  She will continue to follow-up with physical therapy.  I did write for her #30 of tramadol for her to take at night for the pain while we get this worked up.  Jillene will return on February 24 for follow-up with Dr. Lindi Adie and her final Herceptin.

## 2021-07-31 NOTE — Progress Notes (Signed)
[facility] Follow up:    Rachel Baton, MD [place] [address]   DIAGNOSIS:  Cancer Staging  Malignant neoplasm of upper-outer quadrant of right breast in female, estrogen receptor positive ([place]) Staging form: Breast, AJCC 8th Edition - Clinical stage from 08/15/2020: Stage IB (cT2, cN0, cM0, G3, ER+, PR+, HER2+) - Signed by Nicholas Lose, MD on 08/15/2020 Stage prefix: Initial diagnosis   SUMMARY OF ONCOLOGIC HISTORY: Oncology History  Malignant neoplasm of upper-outer quadrant of right breast in female, estrogen receptor positive ([place])  08/08/2020 Initial Diagnosis   Screening detected right breast mass 2.2 cm by ultrasound upper outer quadrant biopsy: Grade 2-3 IDC with DCIS ER 60%, PR 20%, Ki-67 15%, HER-2 +3+   08/15/2020 Cancer Staging   Staging form: Breast, AJCC 8th Edition - Clinical stage from 08/15/2020: Stage IB (cT2, cN0, cM0, G3, ER+, PR+, HER2+) - Signed by Nicholas Lose, MD on 08/15/2020 Stage prefix: Initial diagnosis    09/12/2020 - 11/14/2020 Neo-Adjuvant Chemotherapy   Neoadjuvant Taxol/Herceptin x 12 weeks   09/17/2020 Surgery   Right breast lumpectomy ([facility]): IDC 2.1 cm 6 SLN negative for cancer   11/21/2020 -  Adjuvant Chemotherapy   Maintenance Herceptin   01/26/2021 - 02/20/2021 Radiation Therapy   Adjuvant radiation therapy (Dr. Lisbeth Renshaw)     CURRENT THERAPY: Herceptin  INTERVAL HISTORY: Rachel Gaines 66 y.o. female returns for evaluation of right upper arm pain.  This has been present chronically for several weeks.  She has been seeing physical therapy for lymphedema treatment.  She has noticed that the right upper arm pain has become progressively worse.  She notes that it has been difficult for her to sleep especially laying on her right side.  She describes it as a throbbing sensation when she is trying to lay on it at night.  She is unsure what it could be related to and thought it might just be arthritis.  She is  taking Tylenol which helps sometimes with the pain however at night it can be difficult to manage with just the Tylenol alone.   Patient Active Problem List   Diagnosis Date Noted   Pain due to onychomycosis of toenails of both feet 06/05/2021   Diarrhea 10/24/2020   Port-A-Cath in place 09/26/2020   Hypokalemia 09/26/2020   Malignant neoplasm of upper-outer quadrant of right breast in female, estrogen receptor positive ([place]) 08/15/2020   Personal history of COVID-19 07/04/2020   Mixed hyperlipidemia 07/04/2020   Diabetes mellitus type 2 in obese ([place]) 04/28/2019   Precordial pain 04/28/2019   DOE (dyspnea on exertion) 04/28/2019   Tendonitis, Achilles, right 01/17/2018   Heel pain 09/26/2015   Plantar fasciitis of right foot 10/25/2014   Equinus deformity of foot, acquired 10/25/2014   Tarsal tunnel syndrome 10/25/2014   Deformity of metatarsal bone of right foot 10/25/2014   Genital herpes, unspecified 12/21/2012   DIABETES MELLITUS II, UNCOMPLICATED 91/69/4503   OBESITY, NOS 08/25/2006   ANEMIA, ACUTE BLOOD LOSS 08/25/2006   DEPRESSION, MAJOR, RECURRENT 08/25/2006   HEADACHE, UNSPECIFIED 08/25/2006    is allergic to penicillins.  MEDICAL HISTORY: Past Medical History:  Diagnosis Date   Abnormal Pap smear of cervix 2014   ASCUS with negative HR HPV    Anemia    Breast cancer ([place]) 11/2020   Cancer ([place]) 06/2020   right breast IDC with DCIS   Cataract    Diabetes mellitus without complication ([place])    Hyperlipidemia    on medicine  Plantar fasciitis    STD (sexually transmitted disease)    HSV type II    SURGICAL HISTORY: Past Surgical History:  Procedure Laterality Date   BREAST LUMPECTOMY WITH RADIOACTIVE SEED AND SENTINEL LYMPH NODE BIOPSY Right 12/18/2020   Procedure: RIGHT BREAST LUMPECTOMY WITH RADIOACTIVE SEED AND RIGHT AXILLARY SENTINEL LYMPH NODE BIOPSY;  Surgeon: Rolm Bookbinder, MD;  Location: [facility];  Service: General;  Laterality: Right;    CATARACT EXTRACTION  2019   COLONOSCOPY     COLPOSCOPY W/ BIOPSY / CURETTAGE  02/04/1999   Chronic cervicitis   EYE SURGERY     PORTACATH PLACEMENT N/A 08/26/2020   Procedure: INSERTION PORT-A-CATH;  Surgeon: Rolm Bookbinder, MD;  Location: [facility];  Service: General;  Laterality: N/A;  START TIME OF 12:30 PM FOR 60 MINUTES ROOM 8    SOCIAL HISTORY: Social History   Socioeconomic History   Marital status: Single    Spouse name: Not on file   Number of children: Not on file   Years of education: Not on file   Highest education level: Not on file  Occupational History   Not on file  Tobacco Use   Smoking status: Never   Smokeless tobacco: Never  Vaping Use   Vaping Use: Never used  Substance and Sexual Activity   Alcohol use: No    Alcohol/week: 0.0 standard drinks   Drug use: No   Sexual activity: Not Currently    Birth control/protection: Post-menopausal  Other Topics Concern   Not on file  Social History Narrative   Not on file   Social Determinants of Health   Financial Resource Strain: Not on file  Food Insecurity: Not on file  Transportation Needs: Not on file  Physical Activity: Not on file  Stress: Not on file  Social Connections: Not on file  Intimate Partner Violence: Not At Risk   Fear of Current or Ex-Partner: No   Emotionally Abused: No   Physically Abused: No   Sexually Abused: No    FAMILY HISTORY: Family History  Problem Relation Age of Onset   Diabetes Mother    Diabetes Father    Heart disease Father    Cancer Father        Prostate   Heart failure Father    Heart disease Sister    Diabetes Sister    Diabetes Sister    Heart disease Sister    Heart failure Sister    Thyroid disease Sister    Diabetes Sister    Diabetes Sister    Diabetes Sister    Diabetes Sister    Diabetes Sister    Diabetes Brother    Cancer Brother        lung cancer   Lupus Daughter    Colon cancer Neg Hx    Colon polyps Neg Hx     Rectal cancer Neg Hx    Stomach cancer Neg Hx    Esophageal cancer Neg Hx     Review of Systems  Constitutional:  Negative for appetite change, chills, fatigue, fever and unexpected weight change.  HENT:   Negative for hearing loss, lump/mass and trouble swallowing.   Eyes:  Negative for eye problems and icterus.  Respiratory:  Negative for chest tightness, cough and shortness of breath.   Cardiovascular:  Negative for chest pain, leg swelling and palpitations.  Gastrointestinal:  Negative for abdominal distention, abdominal pain, constipation, diarrhea, nausea and vomiting.  Endocrine: Negative for hot flashes.  Genitourinary:  Negative  for difficulty urinating.   Musculoskeletal:  Negative for arthralgias.  Skin:  Negative for itching and rash.  Neurological:  Negative for dizziness, extremity weakness, headaches and numbness.  Hematological:  Negative for adenopathy. Does not bruise/bleed easily.  Psychiatric/Behavioral:  Negative for depression. The patient is not nervous/anxious.      PHYSICAL EXAMINATION  ECOG PERFORMANCE STATUS: 1 - Symptomatic but completely ambulatory  Vitals:   07/31/21 1253  BP: (!) 160/70  Pulse: 76  Resp: 16  Temp: 97.6 F (36.4 C)  SpO2: 100%    Physical Exam Constitutional:      General: She is not in acute distress.    Appearance: Normal appearance. She is not toxic-appearing.  HENT:     Head: Normocephalic and atraumatic.  Eyes:     General: No scleral icterus. Cardiovascular:     Rate and Rhythm: Normal rate and regular rhythm.     Pulses: Normal pulses.     Heart sounds: Normal heart sounds.  Pulmonary:     Effort: Pulmonary effort is normal.     Breath sounds: Normal breath sounds.  Abdominal:     General: Abdomen is flat. Bowel sounds are normal. There is no distension.     Palpations: Abdomen is soft.     Tenderness: There is no abdominal tenderness.  Musculoskeletal:        General: No swelling.     Cervical back: Neck  supple.  Lymphadenopathy:     Cervical: No cervical adenopathy.  Skin:    General: Skin is warm and dry.     Findings: No rash.  Neurological:     General: No focal deficit present.     Mental Status: She is alert.  Psychiatric:        Mood and Affect: Mood normal.        Behavior: Behavior normal.    LABORATORY DATA:  CBC    Component Value Date/Time   WBC 6.7 07/10/2021 0934   WBC 5.8 11/04/2017 0915   RBC 4.90 07/10/2021 0934   HGB 13.5 07/10/2021 0934   HCT 40.8 07/10/2021 0934   PLT 243 07/10/2021 0934   MCV 83.3 07/10/2021 0934   MCH 27.6 07/10/2021 0934   MCHC 33.1 07/10/2021 0934   RDW 13.4 07/10/2021 0934   LYMPHSABS 1.7 07/10/2021 0934   MONOABS 0.4 07/10/2021 0934   EOSABS 0.1 07/10/2021 0934   BASOSABS 0.0 07/10/2021 0934    CMP     Component Value Date/Time   NA 143 07/10/2021 0934   NA 142 06/18/2019 0956   K 3.9 07/10/2021 0934   CL 107 07/10/2021 0934   CO2 30 07/10/2021 0934   GLUCOSE 211 (H) 07/10/2021 0934   BUN 9 07/10/2021 0934   BUN 10 06/18/2019 0956   CREATININE 0.74 07/10/2021 0934   CALCIUM 10.0 07/10/2021 0934   PROT 7.6 07/10/2021 0934   ALBUMIN 3.9 07/10/2021 0934   AST 12 (L) 07/10/2021 0934   ALT 11 07/10/2021 0934   ALKPHOS 92 07/10/2021 0934   BILITOT 1.1 07/10/2021 0934   GFRNONAA >60 07/10/2021 0934   GFRAA 104 06/18/2019 0956    ASSESSMENT and PLAN:   Malignant neoplasm of upper-outer quadrant of right breast in female, estrogen receptor positive ([place]) 08/08/2020:Screening detected right breast mass 2.2 cm by ultrasound upper outer quadrant biopsy: Grade 2-3 IDC with DCIS ER 60%, PR 20%, Ki-67 15%, HER-2 +3+ T2N0 stage Ia Treatment plan: 1.  Neoadjuvant chemotherapy with Taxol and Herceptin weekly x12 followed  by Herceptin maintenance 2. breast conserving surgery with some [place] biopsy 3.  Adjuvant radiation 4.  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  This is not on the side where she has her port, so we can rule out port related DVT.  However due to the persistent pain we will get an x-ray of the right humerus and also we will get a Doppler of the right arm.  She will continue to follow-up with physical therapy.  I did write for her #30 of tramadol for her to take at night for the pain while we get this worked up.  Rachel Gaines will return on February 24 for follow-up with Dr. Lindi Adie and her final Herceptin.       Orders Placed This Encounter  Procedures   DG Humerus Right    Standing Status:   Future    Standing Expiration Date:   07/31/2022    Order Specific Question:   Reason for Exam (SYMPTOM  OR DIAGNOSIS REQUIRED)    Answer:   right arm pain, h/o breast cancer    Order Specific Question:   Preferred imaging location?    Answer:   [facility]    All questions were answered. The patient knows to call the clinic with any problems, questions or concerns. We can certainly see the patient much sooner if necessary.  Total encounter time: 20 minutes in face-to-face visit time, chart review, lab review, care coordination, order entry, and documentation of the encounter.  Wilber Bihari, NP 07/31/21 1:35 PM Medical Oncology and Hematology [facility] [place], [place] [address] Tel. [phone]    Fax. [phone]  *Total Encounter Time as defined by the Centers for Medicare and Medicaid Services includes, in addition to the face-to-face time of a patient visit (documented in the note above) non-face-to-face time: obtaining and reviewing outside history, ordering and reviewing medications, tests or procedures, care coordination (communications with other health care professionals or caregivers) and  documentation in the medical record.

## 2021-07-31 NOTE — Patient Instructions (Signed)
[facility] MEDICAL ONCOLOGY]   Discharge Instructions: Thank you for choosing [facility] to provide your oncology and hematology care.   If you have a lab appointment with the Cancer Center, please go directly to the Cancer Center and check in at the registration area.   Wear comfortable clothing and clothing appropriate for easy access to any Portacath or PICC line.   We strive to give you quality time with your provider. You may need to reschedule your appointment if you arrive late (15 or more minutes).  Arriving late affects you and other patients whose appointments are after yours.  Also, if you miss three or more appointments without notifying the office, you may be dismissed from the clinic at the provider's discretion.      For prescription refill requests, have your pharmacy contact our office and allow 72 hours for refills to be completed.    Today you received the following chemotherapy and/or immunotherapy agents: Trastuzumab (Herceptin)       To help prevent nausea and vomiting after your treatment, we encourage you to take your nausea medication as directed.  BELOW ARE SYMPTOMS THAT SHOULD BE REPORTED IMMEDIATELY: *FEVER GREATER THAN 100.4 F (38 C) OR HIGHER *CHILLS OR SWEATING *NAUSEA AND VOMITING THAT IS NOT CONTROLLED WITH YOUR NAUSEA MEDICATION *UNUSUAL SHORTNESS OF BREATH *UNUSUAL BRUISING OR BLEEDING *URINARY PROBLEMS (pain or burning when urinating, or frequent urination) *BOWEL PROBLEMS (unusual diarrhea, constipation, pain near the anus) TENDERNESS IN MOUTH AND THROAT WITH OR WITHOUT PRESENCE OF ULCERS (sore throat, sores in mouth, or a toothache) UNUSUAL RASH, SWELLING OR PAIN  UNUSUAL VAGINAL DISCHARGE OR ITCHING   Items with * indicate a potential emergency and should be followed up as soon as possible or go to the Emergency Department if any problems should occur.  Please show the CHEMOTHERAPY ALERT CARD or IMMUNOTHERAPY ALERT  CARD at check-in to the Emergency Department and triage nurse.  Should you have questions after your visit or need to cancel or reschedule your appointment, please contact [facility]  Dept: [phone]  and follow the prompts.  Office hours are 8:00 a.m. to 4:30 p.m. Monday - Friday. Please note that voicemails left after 4:00 p.m. may not be returned until the following business day.  We are closed weekends and major holidays. You have access to a nurse at all times for urgent questions. Please call the main number to the clinic Dept: [phone] and follow the prompts.   For any non-urgent questions, you may also contact your provider using MyChart. We now offer e-Visits for anyone 18 and older to request care online for non-urgent symptoms. For details visit [email or web address].   Also download the MyChart app! Go to the app store, search "MyChart", open the app, select [facility], and log in with your MyChart username and password.  Due to Covid, a mask is required upon entering the hospital/clinic. If you do not have a mask, one will be given to you upon arrival. For doctor visits, patients may have 1 support person aged 18 or older with them. For treatment visits, patients cannot have anyone with them due to current Covid guidelines and our immunocompromised population.  

## 2021-08-20 NOTE — Progress Notes (Signed)
Patient Care Team: Shon Baton, MD as PCP - General (Internal Medicine) Buford Dresser, MD as PCP - Cardiology (Cardiology) Mauro Kaufmann, RN as Oncology Nurse Navigator Rockwell Germany, RN as Oncology Nurse Navigator  DIAGNOSIS:    ICD-10-CM   1. Malignant neoplasm of upper-outer quadrant of right breast in female, estrogen receptor positive ([place])  C50.411    Z17.0       SUMMARY OF ONCOLOGIC HISTORY: Oncology History  Malignant neoplasm of upper-outer quadrant of right breast in female, estrogen receptor positive ([place])  08/08/2020 Initial Diagnosis   Screening detected right breast mass 2.2 cm by ultrasound upper outer quadrant biopsy: Grade 2-3 IDC with DCIS ER 60%, PR 20%, Ki-67 15%, HER-2 +3+   08/15/2020 Cancer Staging   Staging form: Breast, AJCC 8th Edition - Clinical stage from 08/15/2020: Stage IB (cT2, cN0, cM0, G3, ER+, PR+, HER2+) - Signed by Nicholas Lose, MD on 08/15/2020 Stage prefix: Initial diagnosis    09/12/2020 - 11/14/2020 Neo-Adjuvant Chemotherapy   Neoadjuvant Taxol/Herceptin x 12 weeks   09/17/2020 Surgery   Right breast lumpectomy ([facility]): IDC 2.1 cm 6 SLN negative for cancer   11/21/2020 -  Adjuvant Chemotherapy   Maintenance Herceptin   01/26/2021 - 02/20/2021 Radiation Therapy   Adjuvant radiation therapy (Dr. Lisbeth Renshaw)     CHIEF COMPLIANT: Cycle 16 Herceptin  INTERVAL HISTORY: Rachel Gaines is a 66 y.o. with above-mentioned history of right breast cancer who is currently on maintenance Herceptin. She presents to the clinic today for follow-up and treatment.  Today is her last treatment with Herceptin.  She has tolerated it fairly well.  She is also tolerating letrozole reasonably well.  She does have hot flashes and arthralgias and myalgias.  ALLERGIES:  is allergic to penicillins.  MEDICATIONS:  Current Outpatient Medications  Medication Sig Dispense Refill   albuterol (VENTOLIN HFA) 108 (90 Base) MCG/ACT inhaler INHALE 1 TO 2 PUFFS  BY MOUTH INTO THE LUNGS EVERY 4 TO 6 HOURS AS NEEDED FOR SHORTNESS OF BREATH/WHEEZING/COUGH URGENCY 18 g 0   benzonatate (TESSALON) 200 MG capsule Take 1 capsule (200 mg total) by mouth 3 (three) times daily as needed. 30 capsule 1   benzonatate (TESSALON) 200 MG capsule Take 1 capsule (200 mg total) by mouth 3 (three) times daily as needed. 30 capsule 0   Biotin 5 MG CAPS Take 5 mg by mouth daily.     Cholecalciferol (VITAMIN D3) 125 MCG (5000 UT) CAPS Take 5,000 Units by mouth daily.     colestipol (COLESTID) 5 g granules Take 5 grams by mouth 2 (two) times daily. 500 g 12   Continuous Blood Gluc Sensor (FREESTYLE LIBRE 2 SENSOR) MISC Change every 14 days to monitor blood glucose continuosly     Continuous Blood Gluc Sensor (FREESTYLE LIBRE 2 SENSOR) MISC Change every 14 days to monitor blood sugar as directed 9 each 3   diclofenac Sodium (VOLTAREN) 1 % GEL Apply 2 g topically every evening.     diphenoxylate-atropine (LOMOTIL) 2.5-0.025 MG tablet Take 1 tablet by mouth 4 (four) times daily as needed for diarrhea or loose stools 30 tablet 0   empagliflozin (JARDIANCE) 10 MG TABS tablet Take 1 tablet (10 mg total) by mouth daily. 90 tablet 3   empagliflozin (JARDIANCE) 10 MG TABS tablet Take 1 tablet (10 mg total) by mouth daily. 90 tablet 3   FREESTYLE LITE test strip      insulin detemir (LEVEMIR) 100 UNIT/ML FlexPen INJECT 34 UNITS SUBCUTANEOUSLY TWICE DAILY  15 mL 3   Insulin Glargine (BASAGLAR KWIKPEN) 100 UNIT/ML INJECT 34 UNITS UNDER THE SKIN TWICE DAILY (Patient not taking: Reported on 12/05/2020) 45 mL 3   insulin glargine-yfgn (SEMGLEE, YFGN,) 100 UNIT/ML Pen Inject 34 Units into the skin 2 (two) times daily. 15 mL 3   insulin lispro (HUMALOG) 100 UNIT/ML KwikPen Inject 14 Units into the skin 3 (three) times daily with meals. 45 mL 3   letrozole (FEMARA) 2.5 MG tablet Take 1 tablet (2.5 mg total) by mouth daily. 90 tablet 3   lidocaine-prilocaine (EMLA) cream APPLY 1 APPLICATION TOPICALLY  AS NEEDED. (Patient taking differently: Apply 1 application topically daily as needed (port access).) 30 g 0   NONFORMULARY OR COMPOUNDED ITEM [facility]:  Antiinflammatory cream - Diclofenac 3%, Baclofen 2%, Cyclobenzaprine 2%, Lidocaine 2%, dispense 120grams, apply 1-2 grams to affected area 3-4 times a day, +2refills. (Patient taking differently: [facility]:  Antiinflammatory cream - Diclofenac 3%, Baclofen 2%, Cyclobenzaprine 2%, Lidocaine 2%, dispense 120grams, apply 1-2 grams to affected area 3-4 times a day, +2refills.) 120 each 2   Pancrelipase, Lip-Prot-Amyl, (ZENPEP) 40000-126000 units CPEP Take 1 capsule by mouth in the morning and at bedtime. (Patient taking differently: Take 1 capsule by mouth 3 (three) times daily with meals.) 120 capsule    ramipril (ALTACE) 2.5 MG capsule Take 1 capsule (2.5 mg total) by mouth every other day. 45 capsule 2   rosuvastatin (CRESTOR) 40 MG tablet TAKE 1 TABLET BY MOUTH ONCE DAILY (Patient taking differently: Take 40 mg by mouth every evening.) 90 tablet 2   sitaGLIPtin (JANUVIA) 100 MG tablet Take 1 tablet (100 mg total) by mouth daily. 90 tablet 3   traMADol (ULTRAM) 50 MG tablet Take 1 tablet (50 mg total) by mouth every 6 (six) hours as needed. 30 tablet 0   UNIFINE PENTIPS 31G X 8 MM MISC USE WITH RX OF LEVEMIR AND HUMALOG FOUR TIMES DAILY.     UNIFINE PENTIPS 32G X 4 MM MISC USE WITH LEVEMIR AND HUMALOG 4X A DAY DX-E11.65     valACYclovir (VALTREX) 500 MG tablet Take 1 tablet (500 mg total) by mouth 2 (two) times daily. Take for 3 days for outbreak 30 tablet 2   No current facility-administered medications for this visit.    PHYSICAL EXAMINATION: ECOG PERFORMANCE STATUS: 1 - Symptomatic but completely ambulatory  Vitals:   08/21/21 1004  BP: 130/61  Pulse: 79  Resp: 18  Temp: (!) 97.5 F (36.4 C)  SpO2: 98%   Filed Weights   08/21/21 1004  Weight: 164 lb 12.8 oz (74.8 kg)    LABORATORY DATA:  I have reviewed the data as  listed CMP Latest Ref Rng & Units 07/10/2021 05/29/2021 04/20/2021  Glucose 70 - 99 mg/dL 211(H) 95 234(H)  BUN 8 - 23 mg/dL 9 7(L) 7(L)  Creatinine 0.44 - 1.00 mg/dL 0.74 0.71 0.66  Sodium 135 - 145 mmol/L 143 145 138  Potassium 3.5 - 5.1 mmol/L 3.9 3.8 3.6  Chloride 98 - 111 mmol/L 107 110 106  CO2 22 - 32 mmol/L _0 Calcium 8.9 - 10.3 mg/dL 10.0 9.5 9.9  Total Protein 6.5 - 8.1 g/dL 7.6 7.4 7.7  Total Bilirubin 0.3 - 1.2 mg/dL 1.1 1.2 1.5(H)  Alkaline Phos 38 - 126 U/L 92 92 94  AST 15 - 41 U/L 12(L) 17 26  ALT 0 - 44 U/L _1 Lab Results  Component Value Date   WBC  6.7 07/10/2021   HGB 13.5 07/10/2021   HCT 40.8 07/10/2021   MCV 83.3 07/10/2021   PLT 243 07/10/2021   NEUTROABS 4.5 07/10/2021    ASSESSMENT & PLAN:  Malignant neoplasm of upper-outer quadrant of right breast in female, estrogen receptor positive ([place]) 08/08/2020:Screening detected right breast mass 2.2 cm by ultrasound upper outer quadrant biopsy: Grade 2-3 IDC with DCIS ER 60%, PR 20%, Ki-67 15%, HER-2 +3+ T2N0 stage Ia Treatment plan: 1.  Neoadjuvant chemotherapy with Taxol and Herceptin weekly x12 followed by Herceptin maintenance 2. breast conserving surgery with sentinel lymph node biopsy 3.  Adjuvant radiation 4.  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 Diarrhea: Secondary to Herceptin 2. Mild fatigue 3. Hypokalemia-monitoring closely 4. Decreased appetite/weight loss 5. Peripheral Neuropathy: Patient was on clinical trial with PEA and had a remarkable improvement in symptoms.  She is currently on gabapentin.  She will buy PE over-the-counter.   Letrozole toxicities: Occasional hot flashes, muscle cramps  MRI breast 11/17/2020: Right breast malignancy 2.2 cm, 5 mm  FA (MRI biopsy: Benign fat necrosis)   Echocardiogram: 02/23/2021: EF 65 to 70% Breast cancer surveillance: Patient will be set up for mammograms in the next couple of weeks.  Today is her last treatment of Herceptin. Return to clinic in 6 months for follow-up on letrozole.      No orders of the defined types were placed in this encounter.  The patient has a good understanding of the overall plan. she agrees with it. she will call with any problems that may develop before the next visit here.  Total time spent: 30 mins including face to face time and time spent for planning, charting and coordination of care  Rulon Eisenmenger, MD, MPH 08/21/2021  I, Thana Ates, am acting as scribe for Dr. Nicholas Lose.  I have reviewed the above documentation for accuracy and completeness, and I agree with the above.

## 2021-08-20 NOTE — Assessment & Plan Note (Signed)
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  Neoadjuvant chemotherapy with Taxol and Herceptin weekly x12 followed by Herceptin maintenance °2. breast conserving surgery with some [place] biopsy °3.  Adjuvant radiation °4.  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 Diarrhea: Secondary to Herceptin °2. Mild fatigue °3. Hypokalemia-monitoring closely °4. Decreased appetite/weight loss °5. Peripheral Neuropathy: Patient was on clinical trial with PEA and had a remarkable improvement in symptoms.  She is currently on gabapentin.  She will buy PE over-the-counter. °  °Leg cramps: Possibly due to letrozole. °MRI breast 11/17/2020: Right breast malignancy 2.2 cm, 5 mm FA (MRI biopsy: Benign fat necrosis) °  °Echocardiogram: 02/23/2021: EF 65 to 70% ° °Return to clinic every 3 weeks for Herceptin every 6 weeks to follow-up with labs.  She will complete Herceptin by end of February. °  °

## 2021-08-21 ENCOUNTER — Other Ambulatory Visit: Payer: Self-pay

## 2021-08-21 ENCOUNTER — Inpatient Hospital Stay: Payer: 59

## 2021-08-21 ENCOUNTER — Inpatient Hospital Stay: Payer: 59 | Admitting: Hematology and Oncology

## 2021-08-21 DIAGNOSIS — Z170 Estrogen receptor positive status [ER+]: Secondary | ICD-10-CM | POA: Insufficient documentation

## 2021-08-21 DIAGNOSIS — Z923 Personal history of irradiation: Secondary | ICD-10-CM | POA: Insufficient documentation

## 2021-08-21 DIAGNOSIS — G620 Drug-induced polyneuropathy: Secondary | ICD-10-CM | POA: Insufficient documentation

## 2021-08-21 DIAGNOSIS — C50411 Malignant neoplasm of upper-outer quadrant of right female breast: Secondary | ICD-10-CM

## 2021-08-21 DIAGNOSIS — Z801 Family history of malignant neoplasm of trachea, bronchus and lung: Secondary | ICD-10-CM | POA: Diagnosis not present

## 2021-08-21 DIAGNOSIS — R197 Diarrhea, unspecified: Secondary | ICD-10-CM | POA: Diagnosis not present

## 2021-08-21 DIAGNOSIS — Z5112 Encounter for antineoplastic immunotherapy: Secondary | ICD-10-CM | POA: Diagnosis not present

## 2021-08-21 DIAGNOSIS — Z880 Allergy status to penicillin: Secondary | ICD-10-CM | POA: Insufficient documentation

## 2021-08-21 DIAGNOSIS — T451X5D Adverse effect of antineoplastic and immunosuppressive drugs, subsequent encounter: Secondary | ICD-10-CM | POA: Insufficient documentation

## 2021-08-21 MED ORDER — HEPARIN SOD (PORK) LOCK FLUSH 100 UNIT/ML IV SOLN
500.0000 [IU] | Freq: Once | INTRAVENOUS | Status: AC | PRN
  Administered 2021-08-21: 500 [IU]

## 2021-08-21 MED ORDER — SODIUM CHLORIDE 0.9% FLUSH
10.0000 mL | INTRAVENOUS | Status: DC | PRN
  Administered 2021-08-21: 10 mL

## 2021-08-21 MED ORDER — TRASTUZUMAB-ANNS CHEMO 150 MG IV SOLR
6.0000 mg/kg | Freq: Once | INTRAVENOUS | Status: AC
  Administered 2021-08-21: 462 mg via INTRAVENOUS
  Filled 2021-08-21: qty 22

## 2021-08-21 MED ORDER — ACETAMINOPHEN 325 MG PO TABS
650.0000 mg | ORAL_TABLET | Freq: Once | ORAL | Status: AC
  Administered 2021-08-21: 650 mg via ORAL
  Filled 2021-08-21: qty 2

## 2021-08-21 MED ORDER — SODIUM CHLORIDE 0.9 % IV SOLN: Freq: Once | INTRAVENOUS | Status: AC

## 2021-08-21 MED ORDER — DIPHENHYDRAMINE HCL 25 MG PO CAPS
50.0000 mg | ORAL_CAPSULE | Freq: Once | ORAL | Status: AC
  Administered 2021-08-21: 50 mg via ORAL
  Filled 2021-08-21: qty 2

## 2021-08-21 NOTE — Patient Instructions (Signed)
[facility]  Discharge Instructions: ?Thank you for choosing [facility] to provide your oncology and hematology care.  ? ?If you have a lab appointment with the Cancer Center, please go directly to the Cancer Center and check in at the registration area. ?  ?Wear comfortable clothing and clothing appropriate for easy access to any Portacath or PICC line.  ? ?We strive to give you quality time with your provider. You may need to reschedule your appointment if you arrive late (15 or more minutes).  Arriving late affects you and other patients whose appointments are after yours.  Also, if you miss three or more appointments without notifying the office, you may be dismissed from the clinic at the provider?s discretion.    ?  ?For prescription refill requests, have your pharmacy contact our office and allow 72 hours for refills to be completed.   ? ?Today you received the following chemotherapy and/or immunotherapy agents: Trastuzumab    ?  ?To help prevent nausea and vomiting after your treatment, we encourage you to take your nausea medication as directed. ? ?BELOW ARE SYMPTOMS THAT SHOULD BE REPORTED IMMEDIATELY: ?*FEVER GREATER THAN 100.4 F (38 ?C) OR HIGHER ?*CHILLS OR SWEATING ?*NAUSEA AND VOMITING THAT IS NOT CONTROLLED WITH YOUR NAUSEA MEDICATION ?*UNUSUAL SHORTNESS OF BREATH ?*UNUSUAL BRUISING OR BLEEDING ?*URINARY PROBLEMS (pain or burning when urinating, or frequent urination) ?*BOWEL PROBLEMS (unusual diarrhea, constipation, pain near the anus) ?TENDERNESS IN MOUTH AND THROAT WITH OR WITHOUT PRESENCE OF ULCERS (sore throat, sores in mouth, or a toothache) ?UNUSUAL RASH, SWELLING OR PAIN  ?UNUSUAL VAGINAL DISCHARGE OR ITCHING  ? ?Items with * indicate a potential emergency and should be followed up as soon as possible or go to the Emergency Department if any problems should occur. ? ?Please show the CHEMOTHERAPY ALERT CARD or IMMUNOTHERAPY ALERT CARD at check-in  to the Emergency Department and triage nurse. ? ?Should you have questions after your visit or need to cancel or reschedule your appointment, please contact [facility]  Dept: [phone]  and follow the prompts.  Office hours are 8:00 a.m. to 4:30 p.m. Monday - Friday. Please note that voicemails left after 4:00 p.m. may not be returned until the following business day.  We are closed weekends and major holidays. You have access to a nurse at all times for urgent questions. Please call the main number to the clinic Dept: [phone] and follow the prompts. ? ? ?For any non-urgent questions, you may also contact your provider using MyChart. We now offer e-Visits for anyone 18 and older to request care online for non-urgent symptoms. For details visit [email or web address]. ?  ?Also download the MyChart app! Go to the app store, search "MyChart", open the app, select [facility], and log in with your MyChart username and password. ? ?Due to Covid, a mask is required upon entering the hospital/clinic. If you do not have a mask, one will be given to you upon arrival. For doctor visits, patients may have 1 support person aged 18 or older with them. For treatment visits, patients cannot have anyone with them due to current Covid guidelines and our immunocompromised population.  ? ?

## 2021-08-24 ENCOUNTER — Other Ambulatory Visit: Payer: Self-pay

## 2021-08-24 ENCOUNTER — Telehealth: Payer: Self-pay | Admitting: Hematology and Oncology

## 2021-08-24 ENCOUNTER — Other Ambulatory Visit (HOSPITAL_COMMUNITY): Payer: Self-pay

## 2021-08-24 ENCOUNTER — Ambulatory Visit (INDEPENDENT_AMBULATORY_CARE_PROVIDER_SITE_OTHER): Payer: 59 | Admitting: Nurse Practitioner

## 2021-08-24 ENCOUNTER — Encounter: Payer: Self-pay | Admitting: Nurse Practitioner

## 2021-08-24 VITALS — BP 124/80 | Ht 62.0 in | Wt 164.0 lb

## 2021-08-24 DIAGNOSIS — B3731 Acute candidiasis of vulva and vagina: Secondary | ICD-10-CM

## 2021-08-24 DIAGNOSIS — Z780 Asymptomatic menopausal state: Secondary | ICD-10-CM

## 2021-08-24 DIAGNOSIS — B009 Herpesviral infection, unspecified: Secondary | ICD-10-CM

## 2021-08-24 DIAGNOSIS — Z170 Estrogen receptor positive status [ER+]: Secondary | ICD-10-CM | POA: Diagnosis not present

## 2021-08-24 DIAGNOSIS — C50411 Malignant neoplasm of upper-outer quadrant of right female breast: Secondary | ICD-10-CM

## 2021-08-24 DIAGNOSIS — Z01419 Encounter for gynecological examination (general) (routine) without abnormal findings: Secondary | ICD-10-CM | POA: Diagnosis not present

## 2021-08-24 MED ORDER — FLUCONAZOLE 150 MG PO TABS
150.0000 mg | ORAL_TABLET | ORAL | 0 refills | Status: DC | PRN
  Filled 2021-08-24 – 2021-09-04 (×2): qty 10, 10d supply, fill #0

## 2021-08-24 MED ORDER — VALACYCLOVIR HCL 500 MG PO TABS
500.0000 mg | ORAL_TABLET | Freq: Two times a day (BID) | ORAL | 2 refills | Status: DC
  Filled 2021-08-24 – 2021-09-18 (×3): qty 30, 15d supply, fill #0
  Filled 2022-01-30 – 2022-02-11 (×2): qty 30, 15d supply, fill #1

## 2021-08-24 NOTE — Progress Notes (Signed)
Rachel Gaines 1956-01-14 [id number]   History:  66 y.o. [id number] presents for annual exam. Postmenopausal - no HRT, no bleeding. 2014 ASCUS negative HPV, otherwise normal pap history. 07/2020 right breast cancer, triple positive managed with lumpectomy, chemo (finished last week), radiation, and currently on Letrozole. History of DM, HLD. Complains of yeast infections, likely due to [place]. HSV, rare outbreaks.   Gynecologic History Patient's last menstrual period was 06/28/2006 (approximate).   Contraception/Family planning: post menopausal status Sexually active: No  Health Maintenance Last Pap: 01/06/2018. Results were: Normal, 5-year repeat Last mammogram: 07/11/2020. Results were: Right breast mass - diagnosed malignant Last colonoscopy: 05/05/2018. Results were: Polyps, 5-year recall Last Dexa: 02/24/2018. Results were: Normal  Past medical history, past surgical history, family history and social history were all reviewed and documented in the EPIC chart. 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 Son is local, lives with her part-time, has 50 yo daughter and 28 yo son.   ROS:  A ROS was performed and pertinent positives and negatives are included.  Exam:  Vitals:   08/24/21 1114  BP: 124/80  Weight: 164 lb (74.4 kg)  Height: 5\' 2"  (1.575 m)   Body mass index is 30 kg/m.  General appearance:  Normal Thyroid:  Symmetrical, normal in size, without palpable masses or nodularity. Respiratory  Auscultation:  Clear without wheezing or rhonchi Cardiovascular  Auscultation:  Regular rate, without rubs, murmurs or gallops  Edema/varicosities:  Not grossly evident Abdominal  Soft,nontender, without masses, guarding or rebound.  Liver/spleen:  No organomegaly noted  Hernia:  None appreciated  Skin  Inspection:  Grossly normal Breasts: Examined lying and sitting.   Right: Without masses, retractions, nipple discharge or axillary adenopathy.   Left: Without masses, retractions,  nipple discharge or axillary adenopathy. Genitourinary   Inguinal/mons:  Normal without inguinal adenopathy  External genitalia:  Normal appearing vulva with no masses, tenderness, or lesions  BUS/Urethra/Skene's glands:  Normal  Vagina:  Normal appearing with normal color and discharge, no lesions. Atrophic changes  Cervix:  Normal appearing without discharge or lesions  Uterus:  Normal in size, shape and contour.  Midline and mobile, nontender  Adnexa/parametria:     Rt: Normal in size, without masses or tenderness.   Lt: Normal in size, without masses or tenderness.  Anus and perineum: Normal  Digital rectal exam: Declines  Patient informed chaperone available to be present for breast and pelvic exam. Patient has requested no chaperone to be present. Patient has been advised what will be completed during breast and pelvic exam.   Assessment/Plan:  66 y.o. [id number] for annual exam.   Well female exam with routine gynecological exam - Education provided on SBEs, importance of preventative screenings, current guidelines, high calcium diet, regular exercise, and multivitamin daily.  Labs with PCP.   Postmenopausal - no HRT, no bleeding  Malignant neoplasm of upper-outer quadrant of right breast in female, estrogen receptor positive ([place]) - 07/2020 right breast cancer, triple positive managed with lumpectomy, chemo (finished last week), radiation, and currently on Letrozole. Having port removed next week and then scheduling mammogram.   HSV (herpes simplex virus) infection - Plan: valACYclovir (VALTREX) 500 MG tablet as needed for outbreaks.   Vaginal yeast infection - Plan: fluconazole (DIFLUCAN) 150 MG tablet as needed for yeast infections.   Screening for cervical cancer - Normal Pap history.  Will repeat at 5-year interval per guidelines then consider stopping per guidelines.   Screening for colon cancer - 2019 colonoscopy. Will repeat at  5-year interval per GI's recommendation.    Screening for osteoporosis - Normal bone density in 2019. Will repeat next year.   Return in 1 year for annual.   Tamela Gammon DNP, 11:45 AM 08/24/2021

## 2021-08-24 NOTE — Telephone Encounter (Signed)
Scheduled appointment per 2/24 los. Left message. Patient will be mailed an updated calendar.

## 2021-08-26 ENCOUNTER — Encounter: Payer: Self-pay | Admitting: *Deleted

## 2021-09-01 ENCOUNTER — Other Ambulatory Visit (HOSPITAL_COMMUNITY): Payer: Self-pay

## 2021-09-02 ENCOUNTER — Other Ambulatory Visit (HOSPITAL_COMMUNITY): Payer: Self-pay

## 2021-09-03 ENCOUNTER — Other Ambulatory Visit (HOSPITAL_COMMUNITY): Payer: Self-pay

## 2021-09-04 ENCOUNTER — Ambulatory Visit (INDEPENDENT_AMBULATORY_CARE_PROVIDER_SITE_OTHER): Payer: 59 | Admitting: Podiatry

## 2021-09-04 ENCOUNTER — Encounter: Payer: Self-pay | Admitting: Podiatry

## 2021-09-04 ENCOUNTER — Other Ambulatory Visit: Payer: Self-pay

## 2021-09-04 ENCOUNTER — Other Ambulatory Visit (HOSPITAL_COMMUNITY): Payer: Self-pay

## 2021-09-04 DIAGNOSIS — B351 Tinea unguium: Secondary | ICD-10-CM | POA: Diagnosis not present

## 2021-09-04 DIAGNOSIS — M79674 Pain in right toe(s): Secondary | ICD-10-CM | POA: Diagnosis not present

## 2021-09-04 DIAGNOSIS — E119 Type 2 diabetes mellitus without complications: Secondary | ICD-10-CM

## 2021-09-04 DIAGNOSIS — M79675 Pain in left toe(s): Secondary | ICD-10-CM

## 2021-09-04 DIAGNOSIS — M79676 Pain in unspecified toe(s): Secondary | ICD-10-CM | POA: Diagnosis not present

## 2021-09-04 NOTE — Progress Notes (Signed)
This patient returns to my office for at risk foot care.  This patient requires this care by a professional since this patient will be at risk due to having diabetes type 2.   Patient is having pain in the back of her right heel.  Previously diagnosed as achilles tendinitis by Dr.  Jacqualyn Posey.  This patient is unable to cut nails herself since the patient cannot reach her nails.These nails are painful walking and wearing shoes.  This patient presents for at risk foot care today. ? ?General Appearance  Alert, conversant and in no acute stress. ? ?Vascular  Dorsalis pedis and posterior tibial  pulses are palpable  bilaterally.  Capillary return is within normal limits  bilaterally. Temperature is within normal limits  bilaterally. ? ?Neurologic  Senn-Weinstein monofilament wire test within normal limits  bilaterally. Muscle power within normal limits bilaterally. ? ?Nails Thick disfigured discolored nails with subungual debris  from hallux to fifth toes bilaterally. No evidence of bacterial infection or drainage bilaterally. ? ?Orthopedic  No limitations of motion  feet .  No crepitus or effusions noted.  No bony pathology or digital deformities noted.  HAV  B/L.  ? ?Skin  normotropic skin with no porokeratosis noted bilaterally.  No signs of infections or ulcers noted.    ? ?Onychomycosis  Pain in right toes  Pain in left toes   ? ?Consent was obtained for treatment procedures.   Mechanical debridement of nails 1-5  bilaterally performed with a nail nipper.  Filed with dremel without incident.  ? ? ?Return office visit  10 weeks                   Told patient to return for periodic foot care and evaluation due to potential at risk complications. ? ? ?Gardiner Barefoot DPM  ?

## 2021-09-09 ENCOUNTER — Other Ambulatory Visit (HOSPITAL_COMMUNITY): Payer: Self-pay

## 2021-09-11 ENCOUNTER — Other Ambulatory Visit (HOSPITAL_COMMUNITY): Payer: Self-pay

## 2021-09-11 DIAGNOSIS — R079 Chest pain, unspecified: Secondary | ICD-10-CM | POA: Diagnosis not present

## 2021-09-11 DIAGNOSIS — E11319 Type 2 diabetes mellitus with unspecified diabetic retinopathy without macular edema: Secondary | ICD-10-CM | POA: Diagnosis not present

## 2021-09-11 DIAGNOSIS — E1165 Type 2 diabetes mellitus with hyperglycemia: Secondary | ICD-10-CM | POA: Diagnosis not present

## 2021-09-11 DIAGNOSIS — G629 Polyneuropathy, unspecified: Secondary | ICD-10-CM | POA: Diagnosis not present

## 2021-09-11 DIAGNOSIS — E669 Obesity, unspecified: Secondary | ICD-10-CM | POA: Diagnosis not present

## 2021-09-11 DIAGNOSIS — E785 Hyperlipidemia, unspecified: Secondary | ICD-10-CM | POA: Diagnosis not present

## 2021-09-11 DIAGNOSIS — E559 Vitamin D deficiency, unspecified: Secondary | ICD-10-CM | POA: Diagnosis not present

## 2021-09-11 DIAGNOSIS — Z794 Long term (current) use of insulin: Secondary | ICD-10-CM | POA: Diagnosis not present

## 2021-09-11 DIAGNOSIS — C50411 Malignant neoplasm of upper-outer quadrant of right female breast: Secondary | ICD-10-CM | POA: Diagnosis not present

## 2021-09-14 ENCOUNTER — Other Ambulatory Visit (HOSPITAL_COMMUNITY): Payer: Self-pay

## 2021-09-16 ENCOUNTER — Other Ambulatory Visit (HOSPITAL_COMMUNITY): Payer: Self-pay

## 2021-09-16 ENCOUNTER — Encounter: Payer: Self-pay | Admitting: Hematology and Oncology

## 2021-09-16 MED ORDER — INSULIN GLARGINE-YFGN 100 UNIT/ML ~~LOC~~ SOPN
PEN_INJECTOR | SUBCUTANEOUS | 3 refills | Status: DC
  Filled 2021-09-16: qty 45, 61d supply, fill #0
  Filled 2021-11-27: qty 45, 61d supply, fill #1
  Filled 2022-01-30 – 2022-02-11 (×2): qty 45, 61d supply, fill #2
  Filled 2022-04-12: qty 45, 61d supply, fill #3

## 2021-09-16 MED ORDER — INSULIN GLARGINE-YFGN 100 UNIT/ML ~~LOC~~ SOPN
34.0000 [IU] | PEN_INJECTOR | Freq: Two times a day (BID) | SUBCUTANEOUS | 3 refills | Status: DC
  Filled 2021-09-16: qty 60, 88d supply, fill #0

## 2021-09-16 MED ORDER — ROSUVASTATIN CALCIUM 40 MG PO TABS
40.0000 mg | ORAL_TABLET | Freq: Every day | ORAL | 3 refills | Status: DC
  Filled 2021-09-16: qty 90, 90d supply, fill #0
  Filled 2021-12-17: qty 90, 90d supply, fill #1

## 2021-09-17 ENCOUNTER — Other Ambulatory Visit (HOSPITAL_COMMUNITY): Payer: Self-pay

## 2021-09-18 ENCOUNTER — Other Ambulatory Visit (HOSPITAL_COMMUNITY): Payer: Self-pay

## 2021-09-18 MED ORDER — ROSUVASTATIN CALCIUM 40 MG PO TABS
40.0000 mg | ORAL_TABLET | Freq: Every day | ORAL | 1 refills | Status: DC
  Filled 2021-09-18 – 2022-04-05 (×2): qty 90, 90d supply, fill #0
  Filled 2022-08-20: qty 90, 90d supply, fill #1

## 2021-09-21 ENCOUNTER — Telehealth: Payer: Self-pay | Admitting: *Deleted

## 2021-09-21 NOTE — Telephone Encounter (Signed)
Connected with Lorre Nick of [employer] [phone]) to inquire of any further needs.  Received confirmation of APS for Dr. Lindi Adie.  No information for Dr. Lisbeth Renshaw.   ?"Will send to claim benefit manager to return your call.  Reads everything has been received with another note that medical records are needed." ? ?Advised this call is regarding APS form For Rad/Onc Dr. Lisbeth Renshaw only.  Awaiting return call to this nurse directly [phone]). ?

## 2021-09-21 NOTE — Telephone Encounter (Signed)
09/09/2021 Message left for radiation collaborative to check status of form.  ? ?09/18/2021 1730: Noted E-mail to personal work e-mail sent 09/11/2021.  "GM, received voicemail about disability forms for Ms. Altamease Oiler.  I do not have the forms nor does MD or his PA.  We have not seen those forms in Clinic."      ?

## 2021-09-21 NOTE — Telephone Encounter (Addendum)
09/08/2021: [employer] delivered completed form to Medical/Oncology provider for review and signature on 09/07/2021.  Today, form successfully returned to [employer].  Awaiting form from Rad/Onc provider.  ? ?

## 2021-09-22 ENCOUNTER — Encounter: Payer: Self-pay | Admitting: *Deleted

## 2021-09-22 DIAGNOSIS — C50411 Malignant neoplasm of upper-outer quadrant of right female breast: Secondary | ICD-10-CM

## 2021-09-22 NOTE — Research (Signed)
ACCRU--2102 - TREATMENT OF ESTABLISHED CHEMOTHERAPY-INDUCED NEUROPATHY WITH N-PALMITOYLETHANOLAMIDE, A CANNABIMIMETIC NUTRACEUTICAL: A RANDOMIZED DOUBLE-BLIND PHASE II PILOT TRIAL  ?  ?6 Month Follow Up Phone Call  ?  ?The research nurse called the pt for her 6 month follow up call.  The pt said that overall she has been doing "good" since she completed the study in November 2022.  She states she finished Herceptin in February. She states she is scheduled for her mammogram tomorrow. The pt said that her neuropathy symptoms are ongoing, and she resumed her gabapentin for relief of the neuropathy.  She states she recently purchased commercial PEA, and she will begin taking it again in the near future.  The pt was cautioned to not take the gabapentin with the PEA supplement. The pt was reminded that she stopped her gabapentin for 1 week and then started her study drug, PEA.  The pt verbalized understanding.  The pt denies any new health concerns/problems. The pt was thanked for her participation in the study.  The pt had no questions/concerns for the research nurse. The pt will be contacted for her month 12 phone call in September 2023.   ?Brion Aliment RN, BSN, CCRP ?Clinical Research Nurse Lead ?09/22/2021 12:00 PM   ?  ?

## 2021-09-23 ENCOUNTER — Encounter: Payer: Self-pay | Admitting: Family Medicine

## 2021-09-23 ENCOUNTER — Other Ambulatory Visit (HOSPITAL_COMMUNITY): Payer: Self-pay

## 2021-09-23 ENCOUNTER — Ambulatory Visit: Payer: 59

## 2021-09-23 ENCOUNTER — Ambulatory Visit
Admission: RE | Admit: 2021-09-23 | Discharge: 2021-09-23 | Disposition: A | Discharge status: Home / Self Care | Payer: 59 | Source: Ambulatory Visit | Attending: Hematology and Oncology | Admitting: Hematology and Oncology

## 2021-09-23 ENCOUNTER — Ambulatory Visit: Payer: Self-pay

## 2021-09-23 ENCOUNTER — Ambulatory Visit (INDEPENDENT_AMBULATORY_CARE_PROVIDER_SITE_OTHER): Payer: 59 | Admitting: Family Medicine

## 2021-09-23 ENCOUNTER — Other Ambulatory Visit: Payer: Self-pay

## 2021-09-23 VITALS — BP 126/62 | Ht 64.0 in | Wt 164.0 lb

## 2021-09-23 DIAGNOSIS — M25511 Pain in right shoulder: Secondary | ICD-10-CM

## 2021-09-23 DIAGNOSIS — Z170 Estrogen receptor positive status [ER+]: Secondary | ICD-10-CM

## 2021-09-23 MED ORDER — DICLOFENAC SODIUM 75 MG PO TBEC
75.0000 mg | DELAYED_RELEASE_TABLET | Freq: Two times a day (BID) | ORAL | 1 refills | Status: DC
  Filled 2021-09-23: qty 60, 30d supply, fill #0
  Filled 2021-11-02: qty 60, 30d supply, fill #1

## 2021-09-23 NOTE — Patient Instructions (Signed)
You have a frozen shoulder (adhesive capsulitis), a buildup of scar tissue that limits motion of the shoulder joint. ?Limit lifting and overhead activities as much as possible. ?Heat 15 minutes at a time 3-4 times a day may help with movement and stiffness. ?Diclofenac '75mg'$  twice a day with food - typically take for 7 days then as needed. ?Consider steroid injection with nerve block if pain is severe enough and the medicine isn't helping - just call me and we can do this. ?Codman exercises (pendulum, wall walking or table slides, arm circles) - do 3 sets of 10 once or twice a day. ?Physical therapy for rotator cuff strengthening is a consideration once you are out of the painful phase ?Follow up in 1 month. ? ?Do anterior tibialis strengthening for your left leg once a day. ?Consider compression sleeve if this becomes more constant. ? ? ?

## 2021-09-23 NOTE — Progress Notes (Signed)
PCP: Shon Baton, MD ? ?Subjective:  ? ?HPI: ?Patient is a 66 y.o. female with history of radiation and surgery for breast cancer here for right shoulder pain and left leg pain. Patient reports that her medial right upper arm has hurt since surgery in June 2022. Has been doing PT for that but recently developed limited shoulder movement over last 3 weeks. Has pain with trying to reach over head and shoulder feels stuck. This pain is at the location of the rotator cuff. Pain occasionally shoots down to right hand. Occasionally has R neck pain and tightness too.  ? ?Patient also reports left anterior lower leg pain and tingling over 2-3 weeks. Seems to happen most when she walks. Her shin area will feel tight and painful and she has to stop to rest. Tingling in the same area seems to occur randomly and not associated with pain. Does not have pain or tingling in foot, posterior calf, or upper leg. Has tried Voltaren gel and tylenol without relief. ? ?Past Medical History:  ?Diagnosis Date  ? Abnormal Pap smear of cervix 2014  ? ASCUS with negative HR HPV   ? Anemia   ? Breast cancer ([place]) 11/2020  ? Cancer ([place]) 06/2020  ? right breast IDC with DCIS  ? Cataract   ? Diabetes mellitus without complication ([place])   ? Hyperlipidemia   ? on medicine  ? Plantar fasciitis   ? STD (sexually transmitted disease)   ? HSV type II  ? ? ?Current Outpatient Medications on File Prior to Visit  ?Medication Sig Dispense Refill  ? benzonatate (TESSALON) 200 MG capsule Take 1 capsule (200 mg total) by mouth 3 (three) times daily as needed. 30 capsule 1  ? Cholecalciferol (VITAMIN D3) 125 MCG (5000 UT) CAPS Take 5,000 Units by mouth daily. (Patient not taking: Reported on 08/24/2021)    ? colestipol (COLESTID) 5 g granules Take 5 grams by mouth 2 (two) times daily. (Patient not taking: Reported on 08/24/2021) 500 g 12  ? Continuous Blood Gluc Sensor (FREESTYLE LIBRE 2 SENSOR) MISC Change every 14 days to monitor blood glucose continuosly     ? Continuous Blood Gluc Sensor (FREESTYLE LIBRE 2 SENSOR) MISC Change every 14 days to monitor blood sugar as directed 9 each 3  ? diclofenac Sodium (VOLTAREN) 1 % GEL Apply 2 g topically every evening.    ? diphenoxylate-atropine (LOMOTIL) 2.5-0.025 MG tablet Take 1 tablet by mouth 4 (four) times daily as needed for diarrhea or loose stools 30 tablet 0  ? empagliflozin (JARDIANCE) 10 MG TABS tablet Take 1 tablet (10 mg total) by mouth daily. 90 tablet 3  ? empagliflozin (JARDIANCE) 10 MG TABS tablet Take 1 tablet (10 mg total) by mouth daily. 90 tablet 3  ? fluconazole (DIFLUCAN) 150 MG tablet Take 1 tablet (150 mg total) by mouth as needed. 10 tablet 0  ? FREESTYLE LITE test strip     ? insulin detemir (LEVEMIR) 100 UNIT/ML FlexPen INJECT 34 UNITS SUBCUTANEOUSLY TWICE DAILY 15 mL 3  ? Insulin Glargine (BASAGLAR KWIKPEN) 100 UNIT/ML INJECT 34 UNITS UNDER THE SKIN TWICE DAILY (Patient not taking: Reported on 12/05/2020) 45 mL 3  ? insulin glargine-yfgn (SEMGLEE, YFGN,) 100 UNIT/ML Pen Inject 40 Units into the skin in the morning AND 34 Units at bedtime. 45 mL 3  ? insulin lispro (HUMALOG) 100 UNIT/ML KwikPen Inject 14 Units into the skin 3 (three) times daily with meals. 45 mL 3  ? letrozole (FEMARA) 2.5 MG tablet  Take 1 tablet (2.5 mg total) by mouth daily. 90 tablet 3  ? NONFORMULARY OR COMPOUNDED ITEM [facility]:  Antiinflammatory cream - Diclofenac 3%, Baclofen 2%, Cyclobenzaprine 2%, Lidocaine 2%, dispense 120grams, apply 1-2 grams to affected area 3-4 times a day, +2refills. (Patient not taking: Reported on 08/24/2021) 120 each 2  ? Pancrelipase, Lip-Prot-Amyl, (ZENPEP) 40000-126000 units CPEP Take 1 capsule by mouth in the morning and at bedtime. (Patient taking differently: Take 1 capsule by mouth 3 (three) times daily with meals.) 120 capsule   ? ramipril (ALTACE) 2.5 MG capsule Take 1 capsule (2.5 mg total) by mouth every other day. 45 capsule 2  ? rosuvastatin (CRESTOR) 40 MG tablet Take 1 tablet  (40 mg total) by mouth daily. 90 tablet 3  ? rosuvastatin (CRESTOR) 40 MG tablet TAKE 1 TABLET BY MOUTH ONCE DAILY 90 tablet 1  ? sitaGLIPtin (JANUVIA) 100 MG tablet Take 1 tablet (100 mg total) by mouth daily. 90 tablet 3  ? traMADol (ULTRAM) 50 MG tablet Take 1 tablet (50 mg total) by mouth every 6 (six) hours as needed. 30 tablet 0  ? UNIFINE PENTIPS 31G X 8 MM MISC USE WITH RX OF LEVEMIR AND HUMALOG FOUR TIMES DAILY.    ? UNIFINE PENTIPS 32G X 4 MM MISC USE WITH LEVEMIR AND HUMALOG 4X A DAY DX-E11.65    ? valACYclovir (VALTREX) 500 MG tablet Take 1 tablet (500 mg total) by mouth 2 (two) times daily. Take for 3 days for outbreak. 30 tablet 2  ? ?No current facility-administered medications on file prior to visit.  ? ? ?Past Surgical History:  ?Procedure Laterality Date  ? BREAST LUMPECTOMY WITH RADIOACTIVE SEED AND SENTINEL LYMPH NODE BIOPSY Right 12/18/2020  ? Procedure: RIGHT BREAST LUMPECTOMY WITH RADIOACTIVE SEED AND RIGHT AXILLARY SENTINEL LYMPH NODE BIOPSY;  Surgeon: Rolm Bookbinder, MD;  Location: [facility];  Service: General;  Laterality: Right;  ? CATARACT EXTRACTION  2019  ? COLONOSCOPY    ? COLPOSCOPY W/ BIOPSY / CURETTAGE  02/04/1999  ? Chronic cervicitis  ? EYE SURGERY    ? PORTACATH PLACEMENT N/A 08/26/2020  ? Procedure: INSERTION PORT-A-CATH;  Surgeon: Rolm Bookbinder, MD;  Location: [facility];  Service: General;  Laterality: N/A;  START TIME OF 12:30 PM FOR 60 MINUTES ROOM 8  ? ? ?Allergies  ?Allergen Reactions  ? Penicillins Swelling  ? ? ?BP 126/62   Ht '5\' 4"'$  (1.626 m)   Wt 164 lb (74.4 kg)   LMP 06/28/2006 (Approximate)   BMI 28.15 kg/m?  ? ?   ? View : No data to display.  ?  ?  ?  ? ? ?   ? View : No data to display.  ?  ?  ?  ? ? ?    ?Objective:  ?Physical Exam: ?Gen: NAD, comfortable in exam room ?CV: Regular rate, well perfused ?Resp: No increased work of breathing, coughing or wheezing ?Psych: Normal mood and affect.  ?MSK: No visible deformities of shoulders. Right  forward flexion to 90 degrees, left to 110. Right abduction to 80 deg, left to 100. Right ex rotation to 30 deg passively, left to 70. 5/5 strength in bilateral forward flexion, abduction and external/internal rotation. Right shoulder pain with empty can test.  ? ?Left lower leg with no swelling or deformity. No tenderness to palpation. Full knee and ankle ROM. 5/5 strength in plantar/dorsiflexion. No symptoms with peroneal percussion. ? ?Limited MSK u/s right shoulder:  Biceps tendon not visualized in  bicipital groove.  Subscapularis with small interstitial cortical irregularity, no tears.  Infraspinatus hypoechoic at insertion but no full thickness tear.  Supraspinatus with cortical irregularity, small insertional sided tear that appears old.  Moderate subacromial bursitis. ?  ?Assessment & Plan:  ?1. Right shoulder pain - Her recent shoulder symptoms are consistent with adhesive capsulitis. Ultrasound revealed chronic rotator cuff tears and subacromial bursitis, but no acute injury. Patient given shoulder mobility exercises. Discussed possibility of injection in the future. Patient to start oral diclofenac course.  ? ?2. Left anterior lower leg pain - Unclear cause of patient's symptomatology although likely due to anterior tibialis spasm. Non-focal physical exam. Patient to start anterior tibialis strengthening exercises and continue to monitor.  ? ? ?Donald Pore ?MS4, [employer] ? ?

## 2021-09-24 NOTE — Telephone Encounter (Signed)
"  Mickel Baas with [employer] [facility], responding to a call regarding Adajah Cocking.  We do not need a form completed by Dr. Lisbeth Renshaw.  We have requested medical records of Dr. Lisbeth Renshaw for office notes, test results and treatment plan." ? ?No activity required of form nurses per above message received from [facility].   ?

## 2021-09-28 ENCOUNTER — Telehealth: Payer: Self-pay | Admitting: *Deleted

## 2021-09-28 NOTE — Telephone Encounter (Signed)
Received call from pt requesting to have port a cath removed.  Looking through pt chart, Dr. Cristal Generous nurse attempted to contact pt in March to schedule port removal.  RN sent staff message to MD's nurse requesting their team reach out to pt to schedule port removal.  Pt also requesting information on when to return to work.  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  Per MD pt able to return to work 2 weeks after having port removed. Pt educated to contact our office once removal date is scheduled. Pt verbalized understanding.  ?

## 2021-10-05 ENCOUNTER — Ambulatory Visit: Payer: 59 | Attending: Hematology and Oncology

## 2021-10-05 VITALS — Wt 165.2 lb

## 2021-10-05 DIAGNOSIS — Z483 Aftercare following surgery for neoplasm: Secondary | ICD-10-CM | POA: Insufficient documentation

## 2021-10-05 NOTE — Therapy (Signed)
?OUTPATIENT PHYSICAL THERAPY SOZO SCREENING NOTE ? ? ?Patient Name: Rachel Gaines ?MRN: [id number] ?DOB:[birth year], 66 y.o., female ?Today's Date: 10/05/2021 ? ?PCP: Shon Baton, MD ?REFERRING PROVIDER: Nicholas Lose, MD ? ? PT End of Session - 10/05/21 0857   ? ? Visit Number 16   # unchanged due to screen only  ? PT Start Time [phone]   ? PT Stop Time 0900   ? PT Time Calculation (min) 4 min   ? Activity Tolerance Patient tolerated treatment well   ? Behavior During Therapy [facility] for tasks assessed/performed   ? ?  ?  ? ?  ? ? ?Past Medical History:  ?Diagnosis Date  ? Abnormal Pap smear of cervix 2014  ? ASCUS with negative HR HPV   ? Anemia   ? Breast cancer ([place]) 11/2020  ? Cancer ([place]) 06/2020  ? right breast IDC with DCIS  ? Cataract   ? Diabetes mellitus without complication ([place])   ? Hyperlipidemia   ? on medicine  ? Plantar fasciitis   ? STD (sexually transmitted disease)   ? HSV type II  ? ?Past Surgical History:  ?Procedure Laterality Date  ? BREAST LUMPECTOMY WITH RADIOACTIVE SEED AND SENTINEL LYMPH NODE BIOPSY Right 12/18/2020  ? Procedure: RIGHT BREAST LUMPECTOMY WITH RADIOACTIVE SEED AND RIGHT AXILLARY SENTINEL LYMPH NODE BIOPSY;  Surgeon: Rolm Bookbinder, MD;  Location: [place];  Service: General;  Laterality: Right;  ? CATARACT EXTRACTION  2019  ? COLONOSCOPY    ? COLPOSCOPY W/ BIOPSY / CURETTAGE  02/04/1999  ? Chronic cervicitis  ? EYE SURGERY    ? PORTACATH PLACEMENT N/A 08/26/2020  ? Procedure: INSERTION PORT-A-CATH;  Surgeon: Rolm Bookbinder, MD;  Location: [place];  Service: General;  Laterality: N/A;  START TIME OF 12:30 PM FOR 60 MINUTES ROOM 8  ? ?Patient Active Problem List  ? Diagnosis Date Noted  ? Pain due to onychomycosis of toenails of both feet 06/05/2021  ? Diarrhea 10/24/2020  ? Port-A-Cath in place 09/26/2020  ? Hypokalemia 09/26/2020  ? Malignant neoplasm of upper-outer quadrant of right breast in female, estrogen receptor positive ([place]) 08/15/2020  ? Personal history  of COVID-19 07/04/2020  ? Mixed hyperlipidemia 07/04/2020  ? Diabetes mellitus type 2 in obese ([place]) 04/28/2019  ? Precordial pain 04/28/2019  ? DOE (dyspnea on exertion) 04/28/2019  ? Tendonitis, Achilles, right 01/17/2018  ? Heel pain 09/26/2015  ? Plantar fasciitis of right foot 10/25/2014  ? Equinus deformity of foot, acquired 10/25/2014  ? Tarsal tunnel syndrome 10/25/2014  ? Deformity of metatarsal bone of right foot 10/25/2014  ? Genital herpes, unspecified 12/21/2012  ? DIABETES MELLITUS II, UNCOMPLICATED 28/31/5176  ? OBESITY, NOS 08/25/2006  ? ANEMIA, ACUTE BLOOD LOSS 08/25/2006  ? DEPRESSION, MAJOR, RECURRENT 08/25/2006  ? HEADACHE, UNSPECIFIED 08/25/2006  ? ? ?REFERRING DIAG: right breast cancer at risk for lymphedema ? ?THERAPY DIAG:  ?Aftercare following surgery for neoplasm ? 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  Pt had right lumpectomy with SLNB on 12/18/2020.  Radiation ended in September.  ? ?PRECAUTIONS: right UE Lymphedema risk, None ? ?SUBJECTIVE: Pt returns for her 3 month L-Dex screen.  ? ?PAIN:  ?Are you having pain? Yes: NPRS scale: 9/10 ?Pain location: Rt axilla ?Pain description: constant ?Aggravating factors: lifting arm, laying on Rt side ?Relieving factors: not much ? ?SOZO SCREENING: ?Patient was assessed today using the SOZO machine to determine the lymphedema index score.  This was compared to her baseline score. It was determined that she is within the recommended range when compared to her baseline and no further action is needed at this time. She will continue SOZO screenings. These are done every 3 months for 2 years post operatively followed by every 6 months for 2 years, and then annually. ? ? ? ? ?Otelia Limes, PTA ?10/05/2021, 9:04 AM ? ?  ? ?

## 2021-10-06 ENCOUNTER — Encounter (HOSPITAL_COMMUNITY): Payer: Self-pay

## 2021-10-09 ENCOUNTER — Other Ambulatory Visit (HOSPITAL_COMMUNITY): Payer: Self-pay

## 2021-10-09 ENCOUNTER — Ambulatory Visit
Admission: RE | Admit: 2021-10-09 | Discharge: 2021-10-09 | Disposition: A | Discharge status: Home / Self Care | Payer: 59 | Source: Ambulatory Visit | Attending: Hematology and Oncology | Admitting: Hematology and Oncology

## 2021-10-09 DIAGNOSIS — H40013 Open angle with borderline findings, low risk, bilateral: Secondary | ICD-10-CM | POA: Diagnosis not present

## 2021-10-09 DIAGNOSIS — R928 Other abnormal and inconclusive findings on diagnostic imaging of breast: Secondary | ICD-10-CM | POA: Diagnosis not present

## 2021-10-09 DIAGNOSIS — Z853 Personal history of malignant neoplasm of breast: Secondary | ICD-10-CM | POA: Diagnosis not present

## 2021-10-09 DIAGNOSIS — H04123 Dry eye syndrome of bilateral lacrimal glands: Secondary | ICD-10-CM | POA: Diagnosis not present

## 2021-10-09 HISTORY — DX: Personal history of antineoplastic chemotherapy: Z92.21

## 2021-10-09 HISTORY — DX: Personal history of irradiation: Z92.3

## 2021-10-12 ENCOUNTER — Other Ambulatory Visit (HOSPITAL_COMMUNITY): Payer: Self-pay

## 2021-10-16 ENCOUNTER — Telehealth: Payer: Self-pay

## 2021-10-16 NOTE — Telephone Encounter (Signed)
Spoke with Ignatius Specking with [employer] regarding recent request for Patient's medical records and Attending Physician Progress Report. Baxter Flattery stated that records had been received on 10/01/21. Requested records were from 03/28/21 through 10/07/2021. Patient's last visit with Provider was 08/21/21. Baxter Flattery stated that the only information needed was the Attending Physician Progress Report. Patient is receiving Physical Therapy per referral and records have been received from Physical Therapy sessions. Attending Physician Progress Report completed and forwarded to Provider for review and signature. ?

## 2021-10-19 ENCOUNTER — Ambulatory Visit (INDEPENDENT_AMBULATORY_CARE_PROVIDER_SITE_OTHER): Payer: 59 | Admitting: Family Medicine

## 2021-10-19 VITALS — BP 128/82 | Ht 64.0 in | Wt 164.0 lb

## 2021-10-19 DIAGNOSIS — M25511 Pain in right shoulder: Secondary | ICD-10-CM | POA: Diagnosis not present

## 2021-10-19 NOTE — Patient Instructions (Signed)
We will go ahead with an MRI of your shoulder. ?I will call you with the results and next steps. ?

## 2021-10-20 ENCOUNTER — Telehealth: Payer: Self-pay

## 2021-10-20 ENCOUNTER — Encounter: Payer: Self-pay | Admitting: Family Medicine

## 2021-10-20 NOTE — Progress Notes (Signed)
PCP: Shon Baton, MD ? ?Subjective:  ? ?HPI: ?Patient is a 66 y.o. female here for right shoulder pain. ? ?3/29: ?Patient is a 66 y.o. female with history of radiation and surgery for breast cancer here for right shoulder pain and left leg pain. Patient reports that her medial right upper arm has hurt since surgery in June 2022. Has been doing PT for that but recently developed limited shoulder movement over last 3 weeks. Has pain with trying to reach over head and shoulder feels stuck. This pain is at the location of the rotator cuff. Pain occasionally shoots down to right hand. Occasionally has R neck pain and tightness too.  ? ?4/24: ?Patient reports she continues to struggle with right lateral shoulder pain. ?She is using ice, oral diclofenac, several topical medications, doing home exercises and not progressing. ?+ night pain ?No new injuries or trauma. ? ?Past Medical History:  ?Diagnosis Date  ? Abnormal Pap smear of cervix 2014  ? ASCUS with negative HR HPV   ? Anemia   ? Breast cancer ([place]) 11/2020  ? Cancer ([place]) 06/2020  ? right breast IDC with DCIS  ? Cataract   ? Diabetes mellitus without complication ([place])   ? Hyperlipidemia   ? on medicine  ? Personal history of chemotherapy   ? Personal history of radiation therapy   ? Plantar fasciitis   ? STD (sexually transmitted disease)   ? HSV type II  ? ? ?Current Outpatient Medications on File Prior to Visit  ?Medication Sig Dispense Refill  ? benzonatate (TESSALON) 200 MG capsule Take 1 capsule (200 mg total) by mouth 3 (three) times daily as needed. 30 capsule 1  ? Cholecalciferol (VITAMIN D3) 125 MCG (5000 UT) CAPS Take 5,000 Units by mouth daily. (Patient not taking: Reported on 08/24/2021)    ? colestipol (COLESTID) 5 g granules Take 5 grams by mouth 2 (two) times daily. (Patient not taking: Reported on 08/24/2021) 500 g 12  ? Continuous Blood Gluc Sensor (FREESTYLE LIBRE 2 SENSOR) MISC Change every 14 days to monitor blood glucose continuosly    ?  Continuous Blood Gluc Sensor (FREESTYLE LIBRE 2 SENSOR) MISC Change every 14 days to monitor blood sugar as directed 9 each 3  ? diclofenac (VOLTAREN) 75 MG EC tablet Take 1 tablet (75 mg total) by mouth 2 (two) times daily. 60 tablet 1  ? diclofenac Sodium (VOLTAREN) 1 % GEL Apply 2 g topically every evening.    ? diphenoxylate-atropine (LOMOTIL) 2.5-0.025 MG tablet Take 1 tablet by mouth 4 (four) times daily as needed for diarrhea or loose stools 30 tablet 0  ? empagliflozin (JARDIANCE) 10 MG TABS tablet Take 1 tablet (10 mg total) by mouth daily. 90 tablet 3  ? empagliflozin (JARDIANCE) 10 MG TABS tablet Take 1 tablet (10 mg total) by mouth daily. 90 tablet 3  ? fluconazole (DIFLUCAN) 150 MG tablet Take 1 tablet (150 mg total) by mouth as needed. 10 tablet 0  ? FREESTYLE LITE test strip     ? insulin detemir (LEVEMIR) 100 UNIT/ML FlexPen INJECT 34 UNITS SUBCUTANEOUSLY TWICE DAILY 15 mL 3  ? Insulin Glargine (BASAGLAR KWIKPEN) 100 UNIT/ML INJECT 34 UNITS UNDER THE SKIN TWICE DAILY (Patient not taking: Reported on 12/05/2020) 45 mL 3  ? insulin glargine-yfgn (SEMGLEE, YFGN,) 100 UNIT/ML Pen Inject 40 Units into the skin in the morning AND 34 Units at bedtime. 45 mL 3  ? insulin lispro (HUMALOG) 100 UNIT/ML KwikPen Inject 14 Units into the skin  3 (three) times daily with meals. 45 mL 3  ? letrozole (FEMARA) 2.5 MG tablet Take 1 tablet (2.5 mg total) by mouth daily. 90 tablet 3  ? NONFORMULARY OR COMPOUNDED ITEM [facility]:  Antiinflammatory cream - Diclofenac 3%, Baclofen 2%, Cyclobenzaprine 2%, Lidocaine 2%, dispense 120grams, apply 1-2 grams to affected area 3-4 times a day, +2refills. (Patient not taking: Reported on 08/24/2021) 120 each 2  ? Pancrelipase, Lip-Prot-Amyl, (ZENPEP) 40000-126000 units CPEP Take 1 capsule by mouth in the morning and at bedtime. (Patient taking differently: Take 1 capsule by mouth 3 (three) times daily with meals.) 120 capsule   ? ramipril (ALTACE) 2.5 MG capsule Take 1 capsule  (2.5 mg total) by mouth every other day. 45 capsule 2  ? rosuvastatin (CRESTOR) 40 MG tablet Take 1 tablet (40 mg total) by mouth daily. 90 tablet 3  ? rosuvastatin (CRESTOR) 40 MG tablet TAKE 1 TABLET BY MOUTH ONCE DAILY 90 tablet 1  ? sitaGLIPtin (JANUVIA) 100 MG tablet Take 1 tablet (100 mg total) by mouth daily. 90 tablet 3  ? traMADol (ULTRAM) 50 MG tablet Take 1 tablet (50 mg total) by mouth every 6 (six) hours as needed. 30 tablet 0  ? UNIFINE PENTIPS 31G X 8 MM MISC USE WITH RX OF LEVEMIR AND HUMALOG FOUR TIMES DAILY.    ? UNIFINE PENTIPS 32G X 4 MM MISC USE WITH LEVEMIR AND HUMALOG 4X A DAY DX-E11.65    ? valACYclovir (VALTREX) 500 MG tablet Take 1 tablet (500 mg total) by mouth 2 (two) times daily. Take for 3 days for outbreak. 30 tablet 2  ? ?No current facility-administered medications on file prior to visit.  ? ? ?Past Surgical History:  ?Procedure Laterality Date  ? BREAST BIOPSY Right 08/08/2020  ? BREAST BIOPSY Right 11/27/2020  ? BREAST LUMPECTOMY Right 12/18/2020  ? BREAST LUMPECTOMY WITH RADIOACTIVE SEED AND SENTINEL LYMPH NODE BIOPSY Right 12/18/2020  ? Procedure: RIGHT BREAST LUMPECTOMY WITH RADIOACTIVE SEED AND RIGHT AXILLARY SENTINEL LYMPH NODE BIOPSY;  Surgeon: Rolm Bookbinder, MD;  Location: [place];  Service: General;  Laterality: Right;  ? CATARACT EXTRACTION  2019  ? COLONOSCOPY    ? COLPOSCOPY W/ BIOPSY / CURETTAGE  02/04/1999  ? Chronic cervicitis  ? EYE SURGERY    ? PORTACATH PLACEMENT N/A 08/26/2020  ? Procedure: INSERTION PORT-A-CATH;  Surgeon: Rolm Bookbinder, MD;  Location: [place];  Service: General;  Laterality: N/A;  START TIME OF 12:30 PM FOR 60 MINUTES ROOM 8  ? ? ?Allergies  ?Allergen Reactions  ? Penicillins Swelling  ? ? ?BP 128/82   Ht '5\' 4"'$  (1.626 m)   Wt 164 lb (74.4 kg)   LMP 06/28/2006 (Approximate)   BMI 28.15 kg/m?  ? ?   ? View : No data to display.  ?  ?  ?  ? ? ?   ? View : No data to display.  ?  ?  ?  ? ? ?    ?Objective:  ?Physical  Exam: ? ?Gen: NAD, comfortable in exam room ? ?Right shoulder: ?No swelling, ecchymoses.  No gross deformity. ?No TTP AC, biceps tendon. ?Abduction and flexion to 120 degrees, ER only to 45 compared to 70 on left. ?Negative Hawkins, Neers. ?Negative Yergasons. ?Strength 5/5 with empty can and resisted internal/external rotation.  Mild pain empty can ?NV intact distally. ?  ?Assessment & Plan:  ?1. 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  She does have  pain with rotator cuff testing, specifically of supraspinatus.  Will go ahead with MRI as she's not improving to assess for partial rotator cuff tear.  Continue diclofenac, icing, topical medications. ?

## 2021-10-20 NOTE — Telephone Encounter (Signed)
Notified Patient of completion of Attending Physician's Statement-Progress Report. Fax transmission confirmation received. Requested records released to [facility] on 10/01/21 per Health Information Management. Copy of APS Progress Report mailed to Patient as requested. No other needs or concerns voiced at this time. ?

## 2021-10-21 ENCOUNTER — Ambulatory Visit: Payer: 59 | Admitting: Rehabilitation

## 2021-10-22 ENCOUNTER — Telehealth: Payer: Self-pay

## 2021-10-22 NOTE — Telephone Encounter (Signed)
Notified Patient of call received from [facility] stating that medical records had not been received at this time. Provided Representative with telephone number and fax number for [facility] Records Department [phone]: [phone]). Informed Representative that this Nurse spoke with Baxter Flattery from [employer] on 10/16/21 who indicated that medical records had been received. Advised Patient to contact [employer] regarding information that is needed for continued disability claim and to request medical records from Sports Medicine Provider. Patient verbalized understanding.  ?

## 2021-10-28 ENCOUNTER — Encounter: Payer: Self-pay | Admitting: Rehabilitation

## 2021-10-28 ENCOUNTER — Ambulatory Visit: Payer: 59 | Attending: Hematology and Oncology | Admitting: Rehabilitation

## 2021-10-28 DIAGNOSIS — C50411 Malignant neoplasm of upper-outer quadrant of right female breast: Secondary | ICD-10-CM | POA: Insufficient documentation

## 2021-10-28 DIAGNOSIS — M25611 Stiffness of right shoulder, not elsewhere classified: Secondary | ICD-10-CM | POA: Insufficient documentation

## 2021-10-28 DIAGNOSIS — Z483 Aftercare following surgery for neoplasm: Secondary | ICD-10-CM | POA: Insufficient documentation

## 2021-10-28 DIAGNOSIS — R293 Abnormal posture: Secondary | ICD-10-CM | POA: Insufficient documentation

## 2021-10-28 DIAGNOSIS — Z170 Estrogen receptor positive status [ER+]: Secondary | ICD-10-CM | POA: Insufficient documentation

## 2021-10-28 NOTE — Therapy (Signed)
[facility] ?[place] [facility] [place] ?[place], [place], [address] ?Phone: [phone]   Fax:  [phone] ? ?Physical Therapy Treatment ? ?Patient Details  ?Name: Rachel Gaines ?MRN: [id number] ?Date of Birth: [birth year] ?Referring Provider (PT): Dr. Lindi Adie ? ? ?Encounter Date: 10/28/2021 ? ? PT End of Session - 10/28/21 1516   ? ? Visit Number 16   no change to due to no visit  ? Number of Visits 21   ? PT Start Time 1500   ? PT Stop Time 1510   ? PT Time Calculation (min) 10 min   ? Activity Tolerance Patient tolerated treatment well   ? Behavior During Therapy [facility] for tasks assessed/performed   ? ?  ?  ? ?  ? ? ?Past Medical History:  ?Diagnosis Date  ? Abnormal Pap smear of cervix 2014  ? ASCUS with negative HR HPV   ? Anemia   ? Breast cancer ([place]) 11/2020  ? Cancer ([place]) 06/2020  ? right breast IDC with DCIS  ? Cataract   ? Diabetes mellitus without complication ([place])   ? Hyperlipidemia   ? on medicine  ? Personal history of chemotherapy   ? Personal history of radiation therapy   ? Plantar fasciitis   ? STD (sexually transmitted disease)   ? HSV type II  ? ? ?Past Surgical History:  ?Procedure Laterality Date  ? BREAST BIOPSY Right 08/08/2020  ? BREAST BIOPSY Right 11/27/2020  ? BREAST LUMPECTOMY Right 12/18/2020  ? BREAST LUMPECTOMY WITH RADIOACTIVE SEED AND SENTINEL LYMPH NODE BIOPSY Right 12/18/2020  ? Procedure: RIGHT BREAST LUMPECTOMY WITH RADIOACTIVE SEED AND RIGHT AXILLARY SENTINEL LYMPH NODE BIOPSY;  Surgeon: Rolm Bookbinder, MD;  Location: [place];  Service: General;  Laterality: Right;  ? CATARACT EXTRACTION  2019  ? COLONOSCOPY    ? COLPOSCOPY W/ BIOPSY / CURETTAGE  02/04/1999  ? Chronic cervicitis  ? EYE SURGERY    ? PORTACATH PLACEMENT N/A 08/26/2020  ? Procedure: INSERTION PORT-A-CATH;  Surgeon: Rolm Bookbinder, MD;  Location: [place];  Service: General;  Laterality: N/A;  START TIME OF 12:30 PM FOR 60 MINUTES ROOM 8  ? ? ?There  were no vitals filed for this visit. ? ? Subjective Assessment - 10/28/21 1517   ? ? Subjective I am getting an MRI for the shoulder this weekend. Not sure why I am here yet.  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 Currently in Pain? Yes   ? Pain Score 8    ? Pain Location Arm   ? Pain Orientation Right   ? Pain Descriptors / Indicators Sore;Aching   ? Pain Type Chronic pain   ? Pain Onset More than a month ago   ? Pain Frequency Constant   ? ?  ?  ? ?  ? ? ? ? ? ? ? ? ? ? ? ? ? ? ? ? ? ? ? ? ? ? ? ? ? ? ? ? ? ? ? ? ? ? PT Long Term Goals - 07/08/21 1442   ? ?  ? PT LONG TERM GOAL #1  ? Title Pt will return to baseline shoulder ROM measurements and not demonstrate any signs of lymphedema.   ? Baseline Flex: 160,  Abd: 173   ? Status On-going   ?  ? PT LONG TERM GOAL #2  ? Title Pt will report decreased pain by 50% or greater   ?  Status Achieved   ?  ? PT LONG TERM GOAL #3  ? Title Pt will have right shoulder flexion and abd atleast 125 degrees for improved home activies   ? Status Achieved   ?  ? PT LONG TERM GOAL #4  ? Title Quick dash will be no greater than 18%   ? Status On-going   ?  ? PT LONG TERM GOAL #5  ? Title Pt will attend ABC class for education in exercises and lymphedema   ? Baseline in person discussion   ? Status Achieved   ?  ? PT LONG TERM GOAL #6  ? Title Pt will report no room spinning with positon changes   ? Status Achieved   ?  ? PT LONG TERM GOAL #7  ? Title pt will be ind with self exercises including brand daroff and and VOR as needed   ? Status Achieved   ? ?  ?  ? ?  ? ? ? ? ? ? ? ? Plan - 10/28/21 1518   ? ? Clinical Impression Statement Pt and provider unsure about reason for  this scheduled appt.  Pt has had a recent visit with sports medicine with diagnosis of frozen shoulder with possible rotator cuff tear.  Education on pt on PT and injection most likely in both cases even before surgery and that pt should return after plan discussed with Dr. Barbaraann Barthel.   ? ?  ?   ? ?  ? ? ?Patient will benefit from skilled therapeutic intervention in order to improve the following deficits and impairments:    ? ?Visit Diagnosis: ?Aftercare following surgery for neoplasm ? ?Malignant neoplasm of upper-outer quadrant of right breast in female, estrogen receptor positive ([place]) ? ?Stiffness of right shoulder, not elsewhere classified ? ?Abnormal posture ? ? ? ? ?Problem List ?Patient Active Problem List  ? Diagnosis Date Noted  ? Pain due to onychomycosis of toenails of both feet 06/05/2021  ? Diarrhea 10/24/2020  ? Port-A-Cath in place 09/26/2020  ? Hypokalemia 09/26/2020  ? Malignant neoplasm of upper-outer quadrant of right breast in female, estrogen receptor positive ([place]) 08/15/2020  ? Personal history of COVID-19 07/04/2020  ? Mixed hyperlipidemia 07/04/2020  ? Diabetes mellitus type 2 in obese ([place]) 04/28/2019  ? Precordial pain 04/28/2019  ? DOE (dyspnea on exertion) 04/28/2019  ? Tendonitis, Achilles, right 01/17/2018  ? Heel pain 09/26/2015  ? Plantar fasciitis of right foot 10/25/2014  ? Equinus deformity of foot, acquired 10/25/2014  ? Tarsal tunnel syndrome 10/25/2014  ? Deformity of metatarsal bone of right foot 10/25/2014  ? Genital herpes, unspecified 12/21/2012  ? DIABETES MELLITUS II, UNCOMPLICATED 90/38/3338  ? OBESITY, NOS 08/25/2006  ? ANEMIA, ACUTE BLOOD LOSS 08/25/2006  ? DEPRESSION, MAJOR, RECURRENT 08/25/2006  ? HEADACHE, UNSPECIFIED 08/25/2006  ? ? ?Stark Bray, PT ?10/28/2021, 3:19 PM ? ?[facility] ?[place] [facility] [place] ?[place], [place], [address] ?Phone: [phone]   Fax:  [phone] ? ?Name: Rachel Gaines ?MRN: [id number] ?Date of Birth: [birth year] ? ? ? ?

## 2021-10-31 ENCOUNTER — Ambulatory Visit
Admission: RE | Admit: 2021-10-31 | Discharge: 2021-10-31 | Disposition: A | Discharge status: Home / Self Care | Payer: 59 | Source: Ambulatory Visit | Attending: Family Medicine | Admitting: Family Medicine

## 2021-10-31 DIAGNOSIS — M25511 Pain in right shoulder: Secondary | ICD-10-CM

## 2021-10-31 DIAGNOSIS — S46111A Strain of muscle, fascia and tendon of long head of biceps, right arm, initial encounter: Secondary | ICD-10-CM | POA: Diagnosis not present

## 2021-11-02 ENCOUNTER — Other Ambulatory Visit (HOSPITAL_COMMUNITY): Payer: Self-pay

## 2021-11-06 ENCOUNTER — Ambulatory Visit: Payer: Self-pay

## 2021-11-06 ENCOUNTER — Ambulatory Visit (INDEPENDENT_AMBULATORY_CARE_PROVIDER_SITE_OTHER): Payer: 59 | Admitting: Family Medicine

## 2021-11-06 VITALS — BP 120/59 | Ht 64.0 in | Wt 163.0 lb

## 2021-11-06 DIAGNOSIS — M25511 Pain in right shoulder: Secondary | ICD-10-CM | POA: Diagnosis not present

## 2021-11-06 MED ORDER — METHYLPREDNISOLONE ACETATE 40 MG/ML IJ SUSP
40.0000 mg | Freq: Once | INTRAMUSCULAR | Status: AC
Start: 2021-11-06 — End: 2021-11-06
  Administered 2021-11-06: 40 mg via INTRA_ARTICULAR

## 2021-11-06 NOTE — Progress Notes (Signed)
Patient returned today for suprascapular nerve block, intraarticular injection for right shoulder. ? ?After informed written consent timeout was performed, patient was lying on left side on exam table. Right shoulder was prepped with alcohol swabs and utilizing posterior approach with ultrasound guidance, patient's right suprascapular nerve within spinoglenoid notch injected with 3:1 lidocaine:depomedrol.  Then needle was redirected and right glenohumeral  space was injected with 3:1 lidocaine: depomedrol. Patient tolerated the procedure well without immediate complications. ? ?

## 2021-11-12 ENCOUNTER — Encounter: Payer: Self-pay | Admitting: *Deleted

## 2021-11-12 DIAGNOSIS — Z170 Estrogen receptor positive status [ER+]: Secondary | ICD-10-CM

## 2021-11-20 ENCOUNTER — Other Ambulatory Visit (HOSPITAL_COMMUNITY): Payer: Self-pay

## 2021-11-24 DIAGNOSIS — Z853 Personal history of malignant neoplasm of breast: Secondary | ICD-10-CM | POA: Diagnosis not present

## 2021-11-24 DIAGNOSIS — Z452 Encounter for adjustment and management of vascular access device: Secondary | ICD-10-CM | POA: Diagnosis not present

## 2021-11-27 ENCOUNTER — Other Ambulatory Visit (HOSPITAL_COMMUNITY): Payer: Self-pay

## 2021-12-04 DIAGNOSIS — E11319 Type 2 diabetes mellitus with unspecified diabetic retinopathy without macular edema: Secondary | ICD-10-CM | POA: Diagnosis not present

## 2021-12-04 DIAGNOSIS — E785 Hyperlipidemia, unspecified: Secondary | ICD-10-CM | POA: Diagnosis not present

## 2021-12-04 DIAGNOSIS — F418 Other specified anxiety disorders: Secondary | ICD-10-CM | POA: Diagnosis not present

## 2021-12-04 DIAGNOSIS — N393 Stress incontinence (female) (male): Secondary | ICD-10-CM | POA: Diagnosis not present

## 2021-12-04 DIAGNOSIS — G629 Polyneuropathy, unspecified: Secondary | ICD-10-CM | POA: Diagnosis not present

## 2021-12-04 DIAGNOSIS — Z1331 Encounter for screening for depression: Secondary | ICD-10-CM | POA: Diagnosis not present

## 2021-12-04 DIAGNOSIS — E1165 Type 2 diabetes mellitus with hyperglycemia: Secondary | ICD-10-CM | POA: Diagnosis not present

## 2021-12-04 DIAGNOSIS — C50411 Malignant neoplasm of upper-outer quadrant of right female breast: Secondary | ICD-10-CM | POA: Diagnosis not present

## 2021-12-04 DIAGNOSIS — E669 Obesity, unspecified: Secondary | ICD-10-CM | POA: Diagnosis not present

## 2021-12-04 DIAGNOSIS — Z794 Long term (current) use of insulin: Secondary | ICD-10-CM | POA: Diagnosis not present

## 2021-12-07 ENCOUNTER — Telehealth: Payer: Self-pay

## 2021-12-07 NOTE — Telephone Encounter (Signed)
Notified Patient of completion of accommodation request. Fax transmission confirmation received. Copy of paperwork mailed to Patient as requested. No other needs or concerns voiced at this time.

## 2021-12-10 ENCOUNTER — Telehealth: Payer: Self-pay | Admitting: *Deleted

## 2021-12-10 ENCOUNTER — Telehealth: Payer: Self-pay | Admitting: Hematology and Oncology

## 2021-12-10 NOTE — Telephone Encounter (Signed)
RN placed call to pt offering survivorship care plan visit with NP Mendel Ryder.  Pt states she is very interested in the appointment and would like to be seen.  RN sent scheduling message to arrange appt.

## 2021-12-10 NOTE — Telephone Encounter (Signed)
.  Called patient to schedule appointment per 6/15 inbasket, patient is aware of date and time.   

## 2021-12-11 ENCOUNTER — Encounter: Payer: Self-pay | Admitting: Podiatry

## 2021-12-11 ENCOUNTER — Ambulatory Visit (INDEPENDENT_AMBULATORY_CARE_PROVIDER_SITE_OTHER): Payer: 59 | Admitting: Podiatry

## 2021-12-11 DIAGNOSIS — E119 Type 2 diabetes mellitus without complications: Secondary | ICD-10-CM | POA: Diagnosis not present

## 2021-12-11 DIAGNOSIS — B351 Tinea unguium: Secondary | ICD-10-CM

## 2021-12-11 DIAGNOSIS — M79675 Pain in left toe(s): Secondary | ICD-10-CM

## 2021-12-11 DIAGNOSIS — M79674 Pain in right toe(s): Secondary | ICD-10-CM | POA: Diagnosis not present

## 2021-12-11 NOTE — Progress Notes (Signed)
This patient returns to my office for at risk foot care.  This patient requires this care by a professional since this patient will be at risk due to having diabetes type 2.   Patient is having pain in the back of her right heel.   This patient is unable to cut nails herself since the patient cannot reach her nails.These nails are painful walking and wearing shoes.  This patient presents for at risk foot care today.  General Appearance  Alert, conversant and in no acute stress.  Vascular  Dorsalis pedis and posterior tibial  pulses are palpable  bilaterally.  Capillary return is within normal limits  bilaterally. Temperature is within normal limits  bilaterally.  Neurologic  Senn-Weinstein monofilament wire test within normal limits  bilaterally. Muscle power within normal limits bilaterally.  Nails Thick disfigured discolored nails with subungual debris  from hallux to fifth toes bilaterally. No evidence of bacterial infection or drainage bilaterally.  Orthopedic  No limitations of motion  feet .  No crepitus or effusions noted.  No bony pathology or digital deformities noted.  HAV  B/L.   Skin  normotropic skin with no porokeratosis noted bilaterally.  No signs of infections or ulcers noted.     Onychomycosis  Pain in right toes  Pain in left toes    Consent was obtained for treatment procedures.   Mechanical debridement of nails 1-5  bilaterally performed with a nail nipper.  Filed with dremel without incident. Discussed permanent removal of left hallux toenail in future.   Return office visit  10 weeks                   Told patient to return for periodic foot care and evaluation due to potential at risk complications.   Gardiner Barefoot DPM

## 2021-12-17 ENCOUNTER — Other Ambulatory Visit: Payer: Self-pay | Admitting: Nurse Practitioner

## 2021-12-17 ENCOUNTER — Other Ambulatory Visit (HOSPITAL_COMMUNITY): Payer: Self-pay

## 2021-12-18 ENCOUNTER — Other Ambulatory Visit (HOSPITAL_COMMUNITY): Payer: Self-pay

## 2021-12-18 MED ORDER — INSULIN LISPRO (1 UNIT DIAL) 100 UNIT/ML (KWIKPEN)
14.0000 [IU] | PEN_INJECTOR | Freq: Two times a day (BID) | SUBCUTANEOUS | 5 refills | Status: DC
  Filled 2021-12-18 – 2022-06-04 (×2): qty 24, 86d supply, fill #0
  Filled 2022-11-04: qty 24, 86d supply, fill #1

## 2021-12-23 ENCOUNTER — Other Ambulatory Visit (HOSPITAL_COMMUNITY): Payer: Self-pay

## 2021-12-24 ENCOUNTER — Other Ambulatory Visit (HOSPITAL_COMMUNITY): Payer: Self-pay

## 2021-12-24 MED ORDER — INSULIN LISPRO (1 UNIT DIAL) 100 UNIT/ML (KWIKPEN)
14.0000 [IU] | PEN_INJECTOR | Freq: Two times a day (BID) | SUBCUTANEOUS | 3 refills | Status: DC
  Filled 2021-12-24: qty 27, 84d supply, fill #0

## 2021-12-24 MED ORDER — INSULIN PEN NEEDLE 32G X 4 MM MISC
Freq: Four times a day (QID) | 3 refills | Status: AC
Start: 2021-12-24 — End: ?
  Filled 2021-12-24: qty 300, 75d supply, fill #0
  Filled 2021-12-24: qty 100, 25d supply, fill #0
  Filled 2022-04-05: qty 100, 25d supply, fill #1

## 2021-12-25 ENCOUNTER — Other Ambulatory Visit (HOSPITAL_COMMUNITY): Payer: Self-pay

## 2022-01-05 ENCOUNTER — Telehealth: Payer: Self-pay | Admitting: Emergency Medicine

## 2022-01-05 NOTE — Telephone Encounter (Signed)
[facility] - TREATMENT OF ESTABLISHED CHEMOTHERAPY-INDUCED NEUROPATHY WITH N-PALMITOYLETHANOLAMIDE, A CANNABIMIMETIC NUTRACEUTICAL: A RANDOMIZED DOUBLE-BLIND PHASE II PILOT TRIAL  Patient called requesting a return to work note.  Alerted Dr. Geralyn Flash nurse Merleen Nicely, in process now.  Returned call to patient and left VM indicating for her to f/u during appt on 7/14 or before then as needed.  Wells Guiles 'Learta CoddingNeysa Bonito, RN, BSN Clinical Research Nurse I 01/05/22 4:04 PM

## 2022-01-08 ENCOUNTER — Inpatient Hospital Stay: Payer: 59 | Attending: Adult Health | Admitting: Adult Health

## 2022-01-08 ENCOUNTER — Encounter: Payer: Self-pay | Admitting: Adult Health

## 2022-01-08 ENCOUNTER — Telehealth: Payer: Self-pay | Admitting: *Deleted

## 2022-01-08 ENCOUNTER — Other Ambulatory Visit: Payer: Self-pay

## 2022-01-08 VITALS — BP 150/69 | HR 85 | Temp 97.9°F | Resp 16 | Ht 64.0 in | Wt 170.5 lb

## 2022-01-08 DIAGNOSIS — C50411 Malignant neoplasm of upper-outer quadrant of right female breast: Secondary | ICD-10-CM | POA: Diagnosis not present

## 2022-01-08 DIAGNOSIS — E1140 Type 2 diabetes mellitus with diabetic neuropathy, unspecified: Secondary | ICD-10-CM | POA: Diagnosis not present

## 2022-01-08 DIAGNOSIS — Z170 Estrogen receptor positive status [ER+]: Secondary | ICD-10-CM | POA: Diagnosis not present

## 2022-01-08 DIAGNOSIS — Z9221 Personal history of antineoplastic chemotherapy: Secondary | ICD-10-CM | POA: Insufficient documentation

## 2022-01-08 DIAGNOSIS — Z79811 Long term (current) use of aromatase inhibitors: Secondary | ICD-10-CM | POA: Insufficient documentation

## 2022-01-08 DIAGNOSIS — Z923 Personal history of irradiation: Secondary | ICD-10-CM | POA: Insufficient documentation

## 2022-01-08 DIAGNOSIS — R232 Flushing: Secondary | ICD-10-CM | POA: Insufficient documentation

## 2022-01-08 NOTE — Telephone Encounter (Signed)
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  No accommodations available.  Would like her to maintain employment.  Forms nurse initially working with patient out of office.  This nurse revised and returned form.

## 2022-01-08 NOTE — Progress Notes (Signed)
SURVIVORSHIP VISIT:    BRIEF ONCOLOGIC HISTORY:  Oncology History  Malignant neoplasm of upper-outer quadrant of right breast in female, estrogen receptor positive ([place])  08/08/2020 Initial Diagnosis   Screening detected right breast mass 2.2 cm by ultrasound upper outer quadrant biopsy: Grade 2-3 IDC with DCIS ER 60%, PR 20%, Ki-67 15%, HER-2 +3+   08/15/2020 Cancer Staging   Staging form: Breast, AJCC 8th Edition - Clinical stage from 08/15/2020: Stage IB (cT2, cN0, cM0, G3, ER+, PR+, HER2+) - Signed by Nicholas Lose, MD on 08/15/2020 Stage prefix: Initial diagnosis   09/12/2020 - 11/14/2020 Neo-Adjuvant Chemotherapy   Neoadjuvant Taxol/Herceptin x 12 weeks   09/17/2020 Surgery   Right breast lumpectomy ([place]): IDC 2.1 cm 6 SLN negative for cancer   11/21/2020 -  Adjuvant Chemotherapy   Maintenance Herceptin   01/26/2021 - 02/20/2021 Radiation Therapy   Site Technique Total Dose (Gy) Dose per Fx (Gy) Completed Fx Beam Energies  Breast, Right: Breast_Rt 3D 42.56/42.56 2.66 16/16 10X  Breast, Right: Breast_Rt_Bst 3D 8/8 2 4/4 10X     03/17/2021 -  Anti-estrogen oral therapy   Letrozole x 7 years     INTERVAL HISTORY:  Ms. Ozanich to review her survivorship care plan detailing her treatment course for breast cancer, as well as monitoring long-term side effects of that treatment, education regarding health maintenance, screening, and overall wellness and health promotion.     Overall, Ms. Cotrell reports feeling quite well.  She is taking letrozole daily and experiences some mild hot flashes.  She also has some neuropathy in her toes she attributes some to her diabetes and some to the chemotherapy she previously received.  She is otherwise doing well.  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    REVIEW OF SYSTEMS:  Review of Systems  Constitutional:  Negative for appetite change, chills, fatigue, fever and  unexpected weight change.  HENT:   Negative for hearing loss, lump/mass and trouble swallowing.   Eyes:  Negative for eye problems and icterus.  Respiratory:  Negative for chest tightness, cough and shortness of breath.   Cardiovascular:  Negative for chest pain, leg swelling and palpitations.  Gastrointestinal:  Negative for abdominal distention, abdominal pain, constipation, diarrhea, nausea and vomiting.  Endocrine: Positive for hot flashes.  Genitourinary:  Negative for difficulty urinating.   Musculoskeletal:  Negative for arthralgias.  Skin:  Negative for itching and rash.  Neurological:  Positive for numbness. Negative for dizziness, extremity weakness and headaches.  Hematological:  Negative for adenopathy. Does not bruise/bleed easily.  Psychiatric/Behavioral:  Negative for depression. The patient is not nervous/anxious.    Breast: Denies any new nodularity, masses, tenderness, nipple changes, or nipple discharge.      ONCOLOGY TREATMENT TEAM:  1. Surgeon:  Dr. Donne Hazel at [facility] 2. Medical Oncologist: Dr. Lindi Adie  3. Radiation Oncologist: Dr. 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   Past Surgical History:  Procedure Laterality Date   BREAST BIOPSY Right 08/08/2020   BREAST BIOPSY Right 11/27/2020   BREAST LUMPECTOMY Right 12/18/2020   BREAST LUMPECTOMY WITH RADIOACTIVE SEED AND SENTINEL LYMPH NODE BIOPSY Right 12/18/2020   Procedure: RIGHT BREAST LUMPECTOMY WITH RADIOACTIVE SEED AND RIGHT AXILLARY SENTINEL  LYMPH NODE BIOPSY;  Surgeon: Rolm Bookbinder, MD;  Location: [place];  Service: General;  Laterality: Right;   CATARACT EXTRACTION  2019   COLONOSCOPY     COLPOSCOPY W/ BIOPSY / CURETTAGE  02/04/1999   Chronic cervicitis   EYE SURGERY     PORTACATH PLACEMENT N/A 08/26/2020   Procedure: INSERTION PORT-A-CATH;  Surgeon: Rolm Bookbinder, MD;  Location: [place];  Service: General;  Laterality: N/A;  START TIME OF 12:30 PM FOR 60 MINUTES ROOM 8     ALLERGIES:  Allergies  Allergen Reactions   Penicillins Swelling     CURRENT MEDICATIONS:  Outpatient Encounter Medications as of 01/08/2022  Medication Sig   benzonatate (TESSALON) 200 MG capsule Take 1 capsule (200 mg total) by mouth 3 (three) times daily as needed.   Cholecalciferol (VITAMIN D3) 125 MCG (5000 UT) CAPS Take 5,000 Units by mouth once a week.   Continuous Blood Gluc Sensor (FREESTYLE LIBRE 2 SENSOR) MISC Change every 14 days to monitor blood sugar as directed   empagliflozin (JARDIANCE) 10 MG TABS tablet Take 1 tablet (10 mg total) by mouth daily.   insulin glargine-yfgn (SEMGLEE, YFGN,) 100 UNIT/ML Pen Inject 40 Units into the skin in the morning AND 34 Units at bedtime.   insulin lispro (HUMALOG KWIKPEN) 100 UNIT/ML KwikPen Inject 14 Units into the skin 2 (two) times daily. (Patient taking differently: Inject 16 Units into the skin 2 (two) times daily.)   Insulin Pen Needle 32G X 4 MM MISC Use with Levemir and Humalog 4 (four) times daily.   letrozole (FEMARA) 2.5 MG tablet Take 1 tablet (2.5 mg total) by mouth daily.   NONFORMULARY OR COMPOUNDED ITEM [facility]:  Antiinflammatory cream - Diclofenac 3%, Baclofen 2%, Cyclobenzaprine 2%, Lidocaine 2%, dispense 120grams, apply 1-2 grams to affected area 3-4 times a day, +2refills.   ramipril (ALTACE) 2.5 MG capsule Take 1 capsule (2.5 mg total) by mouth every other day.   rosuvastatin (CRESTOR) 40 MG tablet TAKE 1 TABLET BY MOUTH ONCE DAILY    sitaGLIPtin (JANUVIA) 100 MG tablet Take 1 tablet (100 mg total) by mouth daily.   UNIFINE PENTIPS 32G X 4 MM MISC USE WITH LEVEMIR AND HUMALOG 4X A DAY DX-E11.65   [DISCONTINUED] rosuvastatin (CRESTOR) 40 MG tablet Take 1 tablet (40 mg total) by mouth daily.   colestipol (COLESTID) 5 g granules Take 5 grams by mouth 2 (two) times daily. (Patient not taking: Reported on 01/08/2022)   diphenoxylate-atropine (LOMOTIL) 2.5-0.025 MG tablet Take 1 tablet by mouth 4 (four) times daily as needed for diarrhea or loose stools   fluconazole (DIFLUCAN) 150 MG tablet Take 1 tablet (150 mg total) by mouth as needed.   FREESTYLE LITE test strip    insulin detemir (LEVEMIR) 100 UNIT/ML FlexPen INJECT 34 UNITS SUBCUTANEOUSLY TWICE DAILY   Insulin Glargine (BASAGLAR KWIKPEN) 100 UNIT/ML INJECT 34 UNITS UNDER THE SKIN TWICE DAILY (Patient not taking: Reported on 12/05/2020)   Pancrelipase, Lip-Prot-Amyl, (ZENPEP) 40000-126000 units CPEP Take 1 capsule by mouth in the morning and at bedtime. (Patient not taking: Reported on 01/08/2022)   valACYclovir (VALTREX) 500  MG tablet Take 1 tablet (500 mg total) by mouth 2 (two) times daily. Take for 3 days for outbreak. (Patient not taking: Reported on 01/08/2022)   [DISCONTINUED] Continuous Blood Gluc Sensor (FREESTYLE LIBRE 2 SENSOR) MISC Change every 14 days to monitor blood glucose continuosly (Patient not taking: Reported on 01/08/2022)   [DISCONTINUED] diclofenac (VOLTAREN) 75 MG EC tablet Take 1 tablet (75 mg total) by mouth 2 (two) times daily. (Patient not taking: Reported on 01/08/2022)   [DISCONTINUED] diclofenac Sodium (VOLTAREN) 1 % GEL Apply 2 g topically every evening. (Patient not taking: Reported on 01/08/2022)   [DISCONTINUED] empagliflozin (JARDIANCE) 10 MG TABS tablet Take 1 tablet (10 mg total) by mouth daily. (Patient not taking: Reported on 01/08/2022)   [DISCONTINUED] insulin glargine-yfgn (SEMGLEE) 100 UNIT/ML injection Inject into the skin. (Patient not taking:  Reported on 01/08/2022)   [DISCONTINUED] insulin lispro (HUMALOG KWIKPEN) 100 UNIT/ML KwikPen Inject 14-16 Units into the skin 2 (two) times daily as directed (Patient not taking: Reported on 01/08/2022)   [DISCONTINUED] traMADol (ULTRAM) 50 MG tablet Take 1 tablet (50 mg total) by mouth every 6 (six) hours as needed. (Patient not taking: Reported on 01/08/2022)   [DISCONTINUED] UNIFINE PENTIPS 31G X 8 MM MISC USE WITH RX OF LEVEMIR AND HUMALOG FOUR TIMES DAILY. (Patient not taking: Reported on 01/08/2022)   No facility-administered encounter medications on file as of 01/08/2022.     ONCOLOGIC FAMILY HISTORY:  Family History  Problem Relation Age of Onset   Diabetes Mother    Diabetes Father    Heart disease Father    Cancer Father        Prostate   Heart failure Father    Heart disease Sister    Diabetes Sister    Diabetes Sister    Heart disease Sister    Heart failure Sister    Thyroid disease Sister    Diabetes Sister    Diabetes Sister    Diabetes Sister    Diabetes Sister    Diabetes Sister    Diabetes Brother    Cancer Brother        lung cancer   Lupus Daughter    Colon cancer Neg Hx    Colon polyps Neg Hx    Rectal cancer Neg Hx    Stomach cancer Neg Hx    Esophageal cancer Neg Hx       SOCIAL HISTORY:  Social History   Socioeconomic History   Marital status: Single    Spouse name: Not on file   Number of children: Not on file   Years of education: Not on file   Highest education level: Not on file  Occupational History   Not on file  Tobacco Use   Smoking status: Never   Smokeless tobacco: Never  Vaping Use   Vaping Use: Never used  Substance and Sexual Activity   Alcohol use: No    Alcohol/week: 0.0 standard drinks of alcohol   Drug use: No   Sexual activity: Not Currently    Birth control/protection: Post-menopausal  Other Topics Concern   Not on file  Social History Narrative   Not on file   Social Determinants of Health   Financial  Resource Strain: Not on file  Food Insecurity: Not on file  Transportation Needs: Not on file  Physical Activity: Not on file  Stress: Not on file  Social Connections: Not on file  Intimate Partner Violence: Not At Risk (01/01/2021)   Humiliation, Afraid, Rape, and Kick questionnaire  Fear of Current or Ex-Partner: No    Emotionally Abused: No    Physically Abused: No    Sexually Abused: No     OBSERVATIONS/OBJECTIVE:  BP (!) 150/69 (BP Location: Left Arm, Patient Position: Sitting)   Pulse 85   Temp 97.9 F (36.6 C) (Temporal)   Resp 16   Ht _0  (1.626 m)   Wt 170 lb 8 oz (77.3 kg)   LMP 06/28/2006 (Approximate)   SpO2 97%   BMI 29.27 kg/m  GENERAL: Patient is a well appearing female in no acute distress HEENT:  Sclerae anicteric.  Oropharynx clear and moist. No ulcerations or evidence of oropharyngeal candidiasis. Neck is supple.  NODES:  No cervical, supraclavicular, or axillary lymphadenopathy palpated.  BREAST EXAM: right breast s/p lumpectomy and radiation, no sign of local recurrence, left breast benign LUNGS:  Clear to auscultation bilaterally.  No wheezes or rhonchi. HEART:  Regular rate and rhythm. No murmur appreciated. ABDOMEN:  Soft, nontender.  Positive, normoactive bowel sounds. No organomegaly palpated. MSK:  No focal spinal tenderness to palpation. Full range of motion bilaterally in the upper extremities. EXTREMITIES:  No peripheral edema.   SKIN:  Clear with no obvious rashes or skin changes. No nail dyscrasia. NEURO:  Nonfocal. Well oriented.  Appropriate affect.  LABORATORY DATA:  None for this visit.  DIAGNOSTIC IMAGING:  None for this visit.      ASSESSMENT AND PLAN:  Ms.. Federer is a pleasant 66 y.o. female with Stage IB right breast invasive ductal carcinoma, ER+/PR+/HER2+, diagnosed in 07/2020, treated with lumpectomy, adjuvant chemotherapy, maintenance trastuzumab, adjuvant radiation therapy, and anti-estrogen therapy with Letrozole beginning  in 02/2021.  She presents to the Survivorship Clinic for our initial meeting and routine follow-up post-completion of treatment for breast cancer.    1. Stage IB right breast cancer:  Ms. Brandenburger is continuing to recover from definitive treatment for breast cancer. She will follow-up with her medical oncologist, Dr. Lindi Adie in 6 months with history and physical exam per surveillance protocol.  She will continue her anti-estrogen therapy with Letrozole. Thus far, she is tolerating the Letrozole well, with minimal side effects. She was instructed to make Dr. Lindi Adie or myself aware if she begins to experience any worsening side effects of the medication and I could see her back in clinic to help manage those side effects, as needed. Her mammogram is due 06/2021; orders placed today.   Today, a comprehensive survivorship care plan and treatment summary was reviewed with the patient today detailing her breast cancer diagnosis, treatment course, potential late/long-term effects of treatment, appropriate follow-up care with recommendations for the future, and patient education resources.  A copy of this summary, along with a letter will be sent to the patient's primary care provider via mail/fax/In Basket message after today's visit.    2. Hot flashes/neuropathy: She has gabapentin tid but is only taking at night.  I recommended that she try taking one tablet in the morning and one at night on a day she doesn't have to go anywhere to see if she tolerates it.  She is planning on trying this.   3. Bone health:  Given Ms. Nebergall age/history of breast cancer and her current treatment regimen including anti-estrogen therapy with Letrozole, she is at risk for bone demineralization.  Her last DEXA scan was 01/2018 and was normal. She was given education on specific activities to promote bone health.  4. Cancer screening:  Due to Ms. Essex's history and her age, she  should receive screening for skin cancers, colon cancer,  and gynecologic cancers.  The information and recommendations are listed on the patient's comprehensive care plan/treatment summary and were reviewed in detail with the patient.    5. Health maintenance and wellness promotion: Ms. Romero was encouraged to consume 5-7 servings of fruits and vegetables per day. We reviewed the "Nutrition Rainbow" handout.  She was also encouraged to engage in moderate to vigorous exercise for 30 minutes per day most days of the week. We discussed the [employer] fitness program, which is designed for cancer survivors to help them become more physically fit after cancer treatments.  She was instructed to limit her alcohol consumption and continue to abstain from tobacco use.     6. Support services/counseling: It is not uncommon for this period of the patient's cancer care trajectory to be one of many emotions and stressors.  She was given information regarding our available services and encouraged to contact me with any questions or for help enrolling in any of our support group/programs.    Follow up instructions:    -Return to cancer center in 6 months for f/u with Dr. Lindi Adie  -Mammogram due in 06/2021 -Follow up with surgery 1 year -She is welcome to return back to the Survivorship Clinic at any time; no additional follow-up needed at this time.  -Consider referral back to survivorship as a long-term survivor for continued surveillance  The patient was provided an opportunity to ask questions and all were answered. The patient agreed with the plan and demonstrated an understanding of the instructions.   Total encounter time:40 minutes*in face-to-face visit time, chart review, lab review, care coordination, order entry, and documentation of the encounter time.  Wilber Bihari, NP 01/08/22 11:01 AM Medical Oncology and Hematology [facility] [place], [place] [address] Tel. [phone]    Fax. [phone]  *Total Encounter Time  as defined by the Centers for Medicare and Medicaid Services includes, in addition to the face-to-face time of a patient visit (documented in the note above) non-face-to-face time: obtaining and reviewing outside history, ordering and reviewing medications, tests or procedures, care coordination (communications with other health care professionals or caregivers) and documentation in the medical record.

## 2022-01-12 ENCOUNTER — Other Ambulatory Visit (HOSPITAL_COMMUNITY): Payer: Self-pay

## 2022-01-13 ENCOUNTER — Other Ambulatory Visit (HOSPITAL_COMMUNITY): Payer: Self-pay

## 2022-01-13 MED ORDER — GABAPENTIN 300 MG PO CAPS
300.0000 mg | ORAL_CAPSULE | Freq: Three times a day (TID) | ORAL | 3 refills | Status: DC
  Filled 2022-01-13 (×2): qty 90, 30d supply, fill #0
  Filled 2022-05-14: qty 90, 30d supply, fill #1
  Filled 2022-08-20: qty 90, 30d supply, fill #2
  Filled 2022-12-07: qty 90, 30d supply, fill #3

## 2022-01-15 ENCOUNTER — Encounter: Payer: Self-pay | Admitting: Hematology and Oncology

## 2022-01-15 ENCOUNTER — Other Ambulatory Visit (HOSPITAL_COMMUNITY): Payer: Self-pay

## 2022-01-19 ENCOUNTER — Telehealth: Payer: Self-pay

## 2022-01-19 NOTE — Telephone Encounter (Signed)
I called patient back but her voice mailbox is full and I cannot leave a message.

## 2022-01-19 NOTE — Telephone Encounter (Signed)
Her previous pap was prior to age 66, so I would recommend repeating it at her next annual exam and if normal we can stop per guidelines. She is not overdue, she is on a 5-year interval.

## 2022-01-19 NOTE — Telephone Encounter (Signed)
Patient called in voice mail inquiring if she still needs to have Pap smear since she is 66 years old.  (I will confirm she does not confuse Pap smear with annual gyn wellness visit).  Please advised what to recommend to her.  (Looks like last Pap 01/06/2018.)

## 2022-01-20 NOTE — Telephone Encounter (Signed)
Patient informed with below.  

## 2022-01-30 ENCOUNTER — Other Ambulatory Visit: Payer: Self-pay | Admitting: Hematology and Oncology

## 2022-01-30 DIAGNOSIS — R197 Diarrhea, unspecified: Secondary | ICD-10-CM

## 2022-02-01 ENCOUNTER — Other Ambulatory Visit (HOSPITAL_COMMUNITY): Payer: Self-pay

## 2022-02-01 MED ORDER — DIPHENOXYLATE-ATROPINE 2.5-0.025 MG PO TABS
1.0000 | ORAL_TABLET | Freq: Four times a day (QID) | ORAL | 0 refills | Status: DC | PRN
  Filled 2022-02-01: qty 6, 2d supply, fill #0
  Filled 2022-02-01: qty 24, 6d supply, fill #0
  Filled 2022-02-11: qty 6, 2d supply, fill #0
  Filled 2022-02-11: qty 24, 6d supply, fill #0

## 2022-02-08 ENCOUNTER — Ambulatory Visit: Payer: 59

## 2022-02-09 ENCOUNTER — Other Ambulatory Visit (HOSPITAL_COMMUNITY): Payer: Self-pay

## 2022-02-11 ENCOUNTER — Other Ambulatory Visit (HOSPITAL_COMMUNITY): Payer: Self-pay

## 2022-02-12 ENCOUNTER — Other Ambulatory Visit (HOSPITAL_COMMUNITY): Payer: Self-pay

## 2022-02-12 MED ORDER — ZOSTER VAC RECOMB ADJUVANTED 50 MCG/0.5ML IM SUSR
0.5000 mL | INTRAMUSCULAR | 1 refills | Status: DC
  Filled 2022-02-12: qty 0.5, 1d supply, fill #0

## 2022-02-15 ENCOUNTER — Ambulatory Visit: Payer: 59 | Admitting: Physical Therapy

## 2022-02-22 ENCOUNTER — Ambulatory Visit: Payer: 59 | Attending: Hematology and Oncology

## 2022-02-22 ENCOUNTER — Ambulatory Visit: Payer: 59 | Admitting: Hematology and Oncology

## 2022-02-22 VITALS — Wt 169.5 lb

## 2022-02-22 DIAGNOSIS — Z483 Aftercare following surgery for neoplasm: Secondary | ICD-10-CM | POA: Insufficient documentation

## 2022-02-22 NOTE — Therapy (Signed)
OUTPATIENT PHYSICAL THERAPY SOZO SCREENING NOTE   Patient Name: Rachel Gaines MRN: [id number] DOB:[birth year], 66 y.o., female Today's Date: 02/22/2022  PCP: Shon Baton, MD REFERRING PROVIDER: Nicholas Lose, MD   PT End of Session - 02/22/22 1022     Visit Number 16   # unchanged due to screen only   PT Start Time 1020    PT Stop Time 1024    PT Time Calculation (min) 4 min    Activity Tolerance Patient tolerated treatment well    Behavior During Therapy [facility] for tasks assessed/performed             Past Medical History:  Diagnosis Date   Abnormal Pap smear of cervix 2014   ASCUS with negative HR HPV    Anemia    Breast cancer ([place]) 11/2020   Cancer ([place]) 06/2020   right breast IDC with DCIS   Cataract    Diabetes mellitus without complication ([place])    Hyperlipidemia    on medicine   Personal history of chemotherapy    Personal history of radiation therapy    Plantar fasciitis    STD (sexually transmitted disease)    HSV type II   Past Surgical History:  Procedure Laterality Date   BREAST BIOPSY Right 08/08/2020   BREAST BIOPSY Right 11/27/2020   BREAST LUMPECTOMY Right 12/18/2020   BREAST LUMPECTOMY WITH RADIOACTIVE SEED AND SENTINEL LYMPH NODE BIOPSY Right 12/18/2020   Procedure: RIGHT BREAST LUMPECTOMY WITH RADIOACTIVE SEED AND RIGHT AXILLARY SENTINEL LYMPH NODE BIOPSY;  Surgeon: Rolm Bookbinder, MD;  Location: [place];  Service: General;  Laterality: Right;   CATARACT EXTRACTION  2019   COLONOSCOPY     COLPOSCOPY W/ BIOPSY / CURETTAGE  02/04/1999   Chronic cervicitis   EYE SURGERY     PORTACATH PLACEMENT N/A 08/26/2020   Procedure: INSERTION PORT-A-CATH;  Surgeon: Rolm Bookbinder, MD;  Location: [place];  Service: General;  Laterality: N/A;  START TIME OF 12:30 PM FOR 60 MINUTES ROOM 8   Patient Active Problem List   Diagnosis Date Noted   Pain due to onychomycosis of toenails of both feet 06/05/2021   Diarrhea 10/24/2020    Port-A-Cath in place 09/26/2020   Hypokalemia 09/26/2020   Malignant neoplasm of upper-outer quadrant of right breast in female, estrogen receptor positive ([place]) 08/15/2020   Personal history of COVID-19 07/04/2020   Mixed hyperlipidemia 07/04/2020   Diabetes mellitus type 2 in obese ([place]) 04/28/2019   Precordial pain 04/28/2019   DOE (dyspnea on exertion) 04/28/2019   Tendonitis, Achilles, right 01/17/2018   Heel pain 09/26/2015   Plantar fasciitis of right foot 10/25/2014   Equinus deformity of foot, acquired 10/25/2014   Tarsal tunnel syndrome 10/25/2014   Deformity of metatarsal bone of right foot 10/25/2014   Genital herpes, unspecified 12/21/2012   DIABETES MELLITUS II, UNCOMPLICATED 74/94/4967   OBESITY, NOS 08/25/2006   ANEMIA, ACUTE BLOOD LOSS 08/25/2006   DEPRESSION, MAJOR, RECURRENT 08/25/2006   HEADACHE, UNSPECIFIED 08/25/2006    REFERRING DIAG: right breast cancer at risk for lymphedema  THERAPY DIAG:  Aftercare following surgery for neoplasm  PERTINENT HISTORY: R breast cancer, neoadjuvant chemo, grade 2-3 DCIS ER/PR+, will need radiation and antiestrogen treatment, diabetes.  Pt had right lumpectomy with SLNB on 12/18/2020.  Radiation ended in September.   PRECAUTIONS: right UE Lymphedema risk, None  SUBJECTIVE: Pt returns for her 3 month L-Dex screen.   PAIN:  Are you having pain? no SOZO SCREENING: Patient was assessed today  using the SOZO machine to determine the lymphedema index score. This was compared to her baseline score. It was determined that she is within the recommended range when compared to her baseline and no further action is needed at this time. She will continue SOZO screenings. These are done every 3 months for 2 years post operatively followed by every 6 months for 2 years, and then annually.   L-DEX FLOWSHEETS - 02/22/22 1000       L-DEX LYMPHEDEMA SCREENING   Measurement Type Unilateral    L-DEX MEASUREMENT EXTREMITY Upper Extremity     POSITION  Standing    DOMINANT SIDE Right    At Risk Side Right    BASELINE SCORE (UNILATERAL) -4.6    L-DEX SCORE (UNILATERAL) -3.9    VALUE CHANGE (UNILAT) 0.7               Otelia Limes, PTA 02/22/2022, 10:25 AM

## 2022-02-24 ENCOUNTER — Other Ambulatory Visit (HOSPITAL_COMMUNITY): Payer: Self-pay

## 2022-03-05 ENCOUNTER — Ambulatory Visit: Payer: 59 | Admitting: Podiatry

## 2022-03-16 ENCOUNTER — Other Ambulatory Visit (HOSPITAL_COMMUNITY): Payer: Self-pay

## 2022-03-25 ENCOUNTER — Telehealth: Payer: Self-pay | Admitting: *Deleted

## 2022-03-25 ENCOUNTER — Other Ambulatory Visit (HOSPITAL_COMMUNITY): Payer: Self-pay

## 2022-03-25 MED ORDER — FREESTYLE LIBRE 2 SENSOR MISC
3 refills | Status: AC
  Filled 2022-03-25: qty 6, 84d supply, fill #0
  Filled 2022-09-27: qty 6, 84d supply, fill #1
  Filled 2023-01-13: qty 6, 84d supply, fill #2

## 2022-03-25 NOTE — Telephone Encounter (Signed)
ACCRU-[facility]-2102 - TREATMENT OF ESTABLISHED CHEMOTHERAPY-INDUCED NEUROPATHY WITH N-PALMITOYLETHANOLAMIDE, A CANNABIMIMETIC NUTRACEUTICAL: A RANDOMIZED DOUBLE-BLIND PHASE II PILOT TRIAL   12 Month Follow Up Phone Call    The research nurse called the pt for her 6 month follow up call this morning.  The pt said that overall she has been doing "good".  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   She states she is having some toe pain that has made walking difficult for her.  She doesn't know if it is a "gout flare" or related to her previous ingroin toenail.  The pt said that she is scheduled to see her foot doctor next week.  The nurse encouraged the pt to call her primary care physician and be evaluated for a gout attack.  The pt was informed that gout is a treatable medical problem, and she could get relief sooner by being seen this week.  The pt said that she might consider going to Urgent Care.  The pt said that her neuropathy symptoms are ongoing, and she resumed her gabapentin for relief of the neuropathy.  She states she purchased commercial PEA, but she has not taken it yet.  The pt was cautioned to not take the gabapentin with the PEA supplement. The pt was reminded that she will need to stop her gabapentin for 1 week and then start PEA if she wants to see if the commercial PEA will work better for her.  The pt verbalized understanding.  The pt denies any new health concerns/problems. The pt was thanked for her participation in the study.  The pt had no questions/concerns for the research nurse. The pt was informed that this is her last follow up call, and she has officially completed her study participation.    Brion Aliment RN, BSN, CCRP Clinical Research Nurse Lead 03/25/2022 9:42 AM

## 2022-03-29 ENCOUNTER — Ambulatory Visit (INDEPENDENT_AMBULATORY_CARE_PROVIDER_SITE_OTHER): Payer: 59 | Admitting: Podiatry

## 2022-03-29 ENCOUNTER — Other Ambulatory Visit (HOSPITAL_COMMUNITY): Payer: Self-pay

## 2022-03-29 ENCOUNTER — Ambulatory Visit (INDEPENDENT_AMBULATORY_CARE_PROVIDER_SITE_OTHER): Payer: 59

## 2022-03-29 DIAGNOSIS — M7752 Other enthesopathy of left foot: Secondary | ICD-10-CM

## 2022-03-29 DIAGNOSIS — M79672 Pain in left foot: Secondary | ICD-10-CM

## 2022-03-29 MED ORDER — MELOXICAM 15 MG PO TABS
15.0000 mg | ORAL_TABLET | Freq: Every day | ORAL | 1 refills | Status: DC
  Filled 2022-03-29: qty 30, 30d supply, fill #0
  Filled 2022-07-02: qty 30, 30d supply, fill #1

## 2022-03-29 MED ORDER — METHYLPREDNISOLONE 4 MG PO TBPK
ORAL_TABLET | ORAL | 0 refills | Status: DC
  Filled 2022-03-29: qty 21, 6d supply, fill #0

## 2022-03-30 ENCOUNTER — Other Ambulatory Visit: Payer: Self-pay | Admitting: Podiatry

## 2022-03-30 DIAGNOSIS — M7752 Other enthesopathy of left foot: Secondary | ICD-10-CM

## 2022-03-30 MED ORDER — BETAMETHASONE SOD PHOS & ACET 6 (3-3) MG/ML IJ SUSP
3.0000 mg | Freq: Once | INTRAMUSCULAR | Status: AC
  Administered 2022-03-30: 3 mg via INTRA_ARTICULAR

## 2022-03-30 NOTE — Progress Notes (Signed)
Chief Complaint  Patient presents with   Nail Problem    Thick painful toenails, 3 month follow up - left great toenails is very sore    HPI: 66 y.o. female presenting today for complaint of painful thick elongated toenails to the left great toe.  Patient was seen by podiatry here in our office for routine foot care however she complained of pain and tenderness over the past week to the left great toe joint.  Idiopathic onset.  Denies a history of injury.  She says it began this past Friday evening.  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    Past Surgical History:  Procedure Laterality Date   BREAST BIOPSY Right 08/08/2020   BREAST BIOPSY Right 11/27/2020   BREAST LUMPECTOMY Right 12/18/2020   BREAST LUMPECTOMY WITH RADIOACTIVE SEED AND SENTINEL LYMPH NODE BIOPSY Right 12/18/2020   Procedure: RIGHT BREAST LUMPECTOMY WITH RADIOACTIVE SEED AND RIGHT AXILLARY SENTINEL LYMPH NODE BIOPSY;  Surgeon: Rolm Bookbinder, MD;  Location: [place];  Service: General;  Laterality: Right;   CATARACT EXTRACTION  2019   COLONOSCOPY     COLPOSCOPY W/ BIOPSY / CURETTAGE  02/04/1999   Chronic cervicitis   EYE SURGERY     PORTACATH PLACEMENT N/A 08/26/2020   Procedure: INSERTION PORT-A-CATH;  Surgeon: Rolm Bookbinder, MD;  Location: [facility];  Service: General;  Laterality: N/A;  START TIME OF 12:30 PM FOR 60 MINUTES ROOM 8    Allergies  Allergen Reactions   Penicillins Swelling     Physical Exam: General: The patient is alert and oriented x3  in no acute distress.  Dermatology: Skin is warm, dry and supple bilateral lower extremities. Negative for open lesions or macerations.    Vascular: Palpable pedal pulses bilaterally. Capillary refill within normal limits.  There is some very mild erythema and edema localized around the great toe joint of the left foot consistent with possible acute gout  Neurological: Light touch and protective threshold grossly intact  Musculoskeletal Exam: No pedal deformities noted.  Pain on palpation range of motion of the first MTP joint of the left foot  Radiographic Exam:  Normal osseous mineralization.  There is some joint space narrowing to the first MTP of the left foot.  No acute fractures identified.  Normal osseous mineralization.  Asymptomatic posterior heel spur also noted on lateral view  Assessment: 1.  First MTP capsulitis/possible acute gout left   Plan of Care:  1. Patient evaluated. X-Rays reviewed.  2.  Injection of 0.5 cc Celestone Soluspan injected into the first MTP joint left foot 3.  Prescription for Medrol Dosepak 4.  Prescription for meloxicam 15 mg daily after completion of the Dosepak 5.  Advised against going barefoot.  Recommend good supportive shoes and sneakers 6.  Mechanical debridement of nails 1-5 bilateral was also performed as a courtesy by Dr. Prudence Davidson 7.  Return to clinic in 4 weeks  Edrick Kins, DPM [facility]  Dr. Edrick Kins, DPM    [address], [place] [address]                Office [phone]  Fax [phone]

## 2022-04-05 ENCOUNTER — Other Ambulatory Visit (HOSPITAL_COMMUNITY): Payer: Self-pay

## 2022-04-05 MED ORDER — RAMIPRIL 2.5 MG PO CAPS
2.5000 mg | ORAL_CAPSULE | ORAL | 3 refills | Status: DC
  Filled 2022-04-05: qty 45, 90d supply, fill #0
  Filled 2022-07-05 – 2022-07-23 (×2): qty 45, 90d supply, fill #1
  Filled 2022-11-04: qty 45, 90d supply, fill #2

## 2022-04-12 ENCOUNTER — Other Ambulatory Visit (HOSPITAL_COMMUNITY): Payer: Self-pay

## 2022-04-12 MED ORDER — INSULIN PEN NEEDLE 31G X 8 MM MISC
1.0000 | 3 refills | Status: AC
  Filled 2022-04-12: qty 100, 90d supply, fill #0

## 2022-04-15 NOTE — Progress Notes (Signed)
Patient Care Team: Shon Baton, MD as PCP - General (Internal Medicine) Buford Dresser, MD as PCP - Cardiology (Cardiology) Kyung Rudd, MD as Consulting Physician (Radiation Oncology) Nicholas Lose, MD as Consulting Physician (Hematology and Oncology) Rolm Bookbinder, MD as Consulting Physician (General Surgery)  DIAGNOSIS:  Encounter Diagnosis  Name Primary?   Malignant neoplasm of upper-outer quadrant of right breast in female, estrogen receptor positive ([place])     SUMMARY OF ONCOLOGIC HISTORY: Oncology History  Malignant neoplasm of upper-outer quadrant of right breast in female, estrogen receptor positive ([place])  08/08/2020 Initial Diagnosis   Screening detected right breast mass 2.2 cm by ultrasound upper outer quadrant biopsy: Grade 2-3 IDC with DCIS ER 60%, PR 20%, Ki-67 15%, HER-2 +3+   08/15/2020 Cancer Staging   Staging form: Breast, AJCC 8th Edition - Clinical stage from 08/15/2020: Stage IB (cT2, cN0, cM0, G3, ER+, PR+, HER2+) - Signed by Nicholas Lose, MD on 08/15/2020 Stage prefix: Initial diagnosis   09/12/2020 - 11/14/2020 Neo-Adjuvant Chemotherapy   Neoadjuvant Taxol/Herceptin x 12 weeks   09/17/2020 Surgery   Right breast lumpectomy ([facility]): IDC 2.1 cm 6 SLN negative for cancer   11/21/2020 -  Adjuvant Chemotherapy   Maintenance Herceptin   01/26/2021 - 02/20/2021 Radiation Therapy   Site Technique Total Dose (Gy) Dose per Fx (Gy) Completed Fx Beam Energies  Breast, Right: Breast_Rt 3D 42.56/42.56 2.66 16/16 10X  Breast, Right: Breast_Rt_Bst 3D 8/8 2 4/4 10X     03/17/2021 -  Anti-estrogen oral therapy   Letrozole x 7 years     CHIEF COMPLIANT: Follow up breast cancer on Letrozole  INTERVAL HISTORY: Rachel Gaines is a 66 y.o with the above-mentioned. She presents to the clinic for a follow-up. She states that she has some pain and aches. She reports mild hot flashes and fatigue. He gets them mostly at night. Joint stiffness in hands complaining of  not able to write. She does have neuropathy on the scale 1-10 she rates it an 8. She does takes gabapentin and no relief.  ALLERGIES:  is allergic to penicillins.  MEDICATIONS:  Current Outpatient Medications  Medication Sig Dispense Refill   benzonatate (TESSALON) 200 MG capsule Take 1 capsule (200 mg total) by mouth 3 (three) times daily as needed. 30 capsule 1   Cholecalciferol (VITAMIN D3) 125 MCG (5000 UT) CAPS Take 5,000 Units by mouth once a week.     colestipol (COLESTID) 5 g granules Take 5 grams by mouth 2 (two) times daily. 500 g 12   Continuous Blood Gluc Sensor (FREESTYLE LIBRE 2 SENSOR) MISC Change every 14 days to monitor blood glucose as directed 6 each 3   diphenoxylate-atropine (LOMOTIL) 2.5-0.025 MG tablet Take 1 tablet by mouth 4 (four) times daily as needed for diarrhea or loose stools 30 tablet 0   empagliflozin (JARDIANCE) 10 MG TABS tablet Take 1 tablet (10 mg total) by mouth daily. 90 tablet 3   fluconazole (DIFLUCAN) 150 MG tablet Take 1 tablet (150 mg total) by mouth as needed. 10 tablet 0   FREESTYLE LITE test strip      gabapentin (NEURONTIN) 300 MG capsule Take 300 mg by mouth 3 (three) times daily.     gabapentin (NEURONTIN) 300 MG capsule Take 1 capsule (300 mg total) by mouth 3 (three) times daily. 90 capsule 3   insulin glargine-yfgn (SEMGLEE, YFGN,) 100 UNIT/ML Pen Inject 40 Units into the skin in the morning AND 34 Units at bedtime. 45 mL 3   insulin lispro (  HUMALOG KWIKPEN) 100 UNIT/ML KwikPen Inject 14 Units into the skin 2 (two) times daily. (Patient taking differently: Inject 16 Units into the skin 2 (two) times daily.) 45 mL 5   Insulin Pen Needle 31G X 8 MM MISC Use 1 daily  with Saxenda 100 each 3   Insulin Pen Needle 32G X 4 MM MISC Use with Levemir and Humalog 4 (four) times daily. 400 each 3   letrozole (FEMARA) 2.5 MG tablet Take 1 tablet (2.5 mg total) by mouth daily. 90 tablet 3   meloxicam (MOBIC) 15 MG tablet Take 1 tablet (15 mg total) by  mouth daily. 30 tablet 1   methylPREDNISolone (MEDROL DOSEPAK) 4 MG TBPK tablet Take as directed 21 tablet 0   NONFORMULARY OR COMPOUNDED ITEM [facility]:  Antiinflammatory cream - Diclofenac 3%, Baclofen 2%, Cyclobenzaprine 2%, Lidocaine 2%, dispense 120grams, apply 1-2 grams to affected area 3-4 times a day, +2refills. 120 each 2   Pancrelipase, Lip-Prot-Amyl, (ZENPEP) 40000-126000 units CPEP Take 1 capsule by mouth in the morning and at bedtime. 120 capsule    ramipril (ALTACE) 2.5 MG capsule Take 1 capsule (2.5 mg total) by mouth every other day. 45 capsule 3   rosuvastatin (CRESTOR) 40 MG tablet TAKE 1 TABLET BY MOUTH ONCE DAILY 90 tablet 1   sitaGLIPtin (JANUVIA) 100 MG tablet Take 1 tablet (100 mg total) by mouth daily. 90 tablet 3   UNIFINE PENTIPS 32G X 4 MM MISC USE WITH LEVEMIR AND HUMALOG 4X A DAY DX-E11.65     valACYclovir (VALTREX) 500 MG tablet Take 1 tablet (500 mg total) by mouth 2 (two) times daily. Take for 3 days for outbreak. 30 tablet 2   Zoster Vaccine Adjuvanted [facility]) injection Inject 0.5 mLs into the muscle. 0.5 mL 1   insulin detemir (LEVEMIR) 100 UNIT/ML FlexPen INJECT 34 UNITS SUBCUTANEOUSLY TWICE DAILY 15 mL 3   Insulin Glargine (BASAGLAR KWIKPEN) 100 UNIT/ML INJECT 34 UNITS UNDER THE SKIN TWICE DAILY (Patient not taking: Reported on 12/05/2020) 45 mL 3   No current facility-administered medications for this visit.    PHYSICAL EXAMINATION: ECOG PERFORMANCE STATUS: 1 - Symptomatic but completely ambulatory  Vitals:   04/19/22 0947  BP: (!) 149/66  Pulse: 89  Resp: 18  Temp: 97.7 F (36.5 C)  SpO2: 97%   Filed Weights   04/19/22 0947  Weight: 166 lb 9.6 oz (75.6 kg)    BREAST: No palpable masses or nodules in either right or left breasts. No palpable axillary supraclavicular or infraclavicular adenopathy no breast tenderness or nipple discharge. (exam performed in the presence of a chaperone)  LABORATORY DATA:  I have reviewed the data as  listed    Latest Ref Rng & Units 07/10/2021    9:34 AM 05/29/2021    8:29 AM 04/20/2021   12:47 PM  CMP  Glucose 70 - 99 mg/dL 211  95  234   BUN 8 - 23 mg/dL _0 Creatinine 0.44 - 1.00 mg/dL 0.74  0.71  0.66   Sodium 135 - 145 mmol/L 143  145  138   Potassium 3.5 - 5.1 mmol/L 3.9  3.8  3.6   Chloride 98 - 111 mmol/L 107  110  106   CO2 22 - 32 mmol/L _1 Calcium 8.9 - 10.3 mg/dL 10.0  9.5  9.9   Total Protein 6.5 - 8.1 g/dL 7.6  7.4  7.7   Total Bilirubin 0.3 -  1.2 mg/dL 1.1  1.2  1.5   Alkaline Phos 38 - 126 U/L 92  92  94   AST 15 - 41 U/L _0 ALT 0 - 44 U/L _1 Lab Results  Component Value Date   WBC 6.7 07/10/2021   HGB 13.5 07/10/2021   HCT 40.8 07/10/2021   MCV 83.3 07/10/2021   PLT 243 07/10/2021   NEUTROABS 4.5 07/10/2021    ASSESSMENT & PLAN:  Malignant neoplasm of upper-outer quadrant of right breast in female, estrogen receptor positive ([place]) 08/08/2020:Screening detected right breast mass 2.2 cm by ultrasound upper outer quadrant biopsy: Grade 2-3 IDC with DCIS ER 60%, PR 20%, Ki-67 15%, HER-2 +3+ T2N0 stage Ia Treatment plan: 1.  Neoadjuvant chemotherapy with Taxol and Herceptin weekly x12 followed by Herceptin maintenance completed February 2023 2. breast conserving surgery 09/17/2021:Right breast lumpectomy ([facility]): IDC 2.1 cm 6 SLN negative for cancer 3.  Adjuvant radiation completed 02/20/2021 4.  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  She would like to taper it off and then try PEA.   Breast cancer surveillance: 1.  Breast exam 04/19/2022: Benign 2. mammogram and bone density scheduled for 09/27/2022  Return to clinic in 1 year for follow-up    No orders of the defined types were  placed in this encounter.  The patient has a good understanding of the overall plan. she agrees with it. she will call with any problems that may develop before the next visit here. Total time spent: 30 mins including face to face time and time spent for planning, charting and co-ordination of care   Harriette Ohara, MD 04/19/22    I Gardiner Coins am scribing for Dr. Lindi Adie  I have reviewed the above documentation for accuracy and completeness, and I agree with the above.

## 2022-04-19 ENCOUNTER — Inpatient Hospital Stay: Payer: 59 | Attending: Adult Health | Admitting: Hematology and Oncology

## 2022-04-19 DIAGNOSIS — Z9221 Personal history of antineoplastic chemotherapy: Secondary | ICD-10-CM | POA: Insufficient documentation

## 2022-04-19 DIAGNOSIS — M2560 Stiffness of unspecified joint, not elsewhere classified: Secondary | ICD-10-CM | POA: Diagnosis not present

## 2022-04-19 DIAGNOSIS — Z79811 Long term (current) use of aromatase inhibitors: Secondary | ICD-10-CM | POA: Insufficient documentation

## 2022-04-19 DIAGNOSIS — R232 Flushing: Secondary | ICD-10-CM | POA: Diagnosis not present

## 2022-04-19 DIAGNOSIS — C50411 Malignant neoplasm of upper-outer quadrant of right female breast: Secondary | ICD-10-CM | POA: Insufficient documentation

## 2022-04-19 DIAGNOSIS — R5383 Other fatigue: Secondary | ICD-10-CM | POA: Diagnosis not present

## 2022-04-19 DIAGNOSIS — Z923 Personal history of irradiation: Secondary | ICD-10-CM | POA: Diagnosis not present

## 2022-04-19 DIAGNOSIS — E1140 Type 2 diabetes mellitus with diabetic neuropathy, unspecified: Secondary | ICD-10-CM | POA: Diagnosis not present

## 2022-04-19 DIAGNOSIS — Z170 Estrogen receptor positive status [ER+]: Secondary | ICD-10-CM | POA: Insufficient documentation

## 2022-04-19 NOTE — Assessment & Plan Note (Signed)
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  Breast exam 04/19/2022: Benign 2. mammogram and bone density scheduled for 09/27/2022  Return to clinic in 1 year for follow-up

## 2022-04-30 DIAGNOSIS — E11319 Type 2 diabetes mellitus with unspecified diabetic retinopathy without macular edema: Secondary | ICD-10-CM | POA: Diagnosis not present

## 2022-04-30 DIAGNOSIS — R7989 Other specified abnormal findings of blood chemistry: Secondary | ICD-10-CM | POA: Diagnosis not present

## 2022-04-30 DIAGNOSIS — E785 Hyperlipidemia, unspecified: Secondary | ICD-10-CM | POA: Diagnosis not present

## 2022-04-30 DIAGNOSIS — E559 Vitamin D deficiency, unspecified: Secondary | ICD-10-CM | POA: Diagnosis not present

## 2022-05-03 ENCOUNTER — Ambulatory Visit (INDEPENDENT_AMBULATORY_CARE_PROVIDER_SITE_OTHER): Payer: 59 | Admitting: Podiatry

## 2022-05-03 DIAGNOSIS — M7752 Other enthesopathy of left foot: Secondary | ICD-10-CM | POA: Diagnosis not present

## 2022-05-03 MED ORDER — BETAMETHASONE SOD PHOS & ACET 6 (3-3) MG/ML IJ SUSP
3.0000 mg | Freq: Once | INTRAMUSCULAR | Status: AC
  Administered 2022-05-03: 3 mg via INTRA_ARTICULAR

## 2022-05-03 NOTE — Progress Notes (Signed)
Chief Complaint  Patient presents with   foot  care    Patient is here for left foot follow-up.    HPI: 66 y.o. female presenting today for follow-up evaluation of pain and tenderness associated to the left great toe joint.  Patient states that overall there is some improvement.  The injections did help.  She does have some residual pain and tenderness but overall improvement.  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    Past Surgical History:  Procedure Laterality Date   BREAST BIOPSY Right 08/08/2020   BREAST BIOPSY Right 11/27/2020   BREAST LUMPECTOMY Right 12/18/2020   BREAST LUMPECTOMY WITH RADIOACTIVE SEED AND SENTINEL LYMPH NODE BIOPSY Right 12/18/2020   Procedure: RIGHT BREAST LUMPECTOMY WITH RADIOACTIVE SEED AND RIGHT AXILLARY SENTINEL LYMPH NODE BIOPSY;  Surgeon: Rolm Bookbinder, MD;  Location: [place];  Service: General;  Laterality: Right;   CATARACT EXTRACTION  2019   COLONOSCOPY     COLPOSCOPY W/ BIOPSY / CURETTAGE  02/04/1999   Chronic cervicitis   EYE SURGERY     PORTACATH PLACEMENT N/A 08/26/2020   Procedure: INSERTION PORT-A-CATH;  Surgeon: Rolm Bookbinder, MD;  Location: [place];  Service: General;  Laterality: N/A;  START TIME OF 12:30 PM FOR 60 MINUTES ROOM 8    Allergies  Allergen Reactions   Penicillins Swelling     Physical Exam: General: The patient is alert and oriented x3 in no acute distress.  Dermatology: Skin is warm, dry and supple bilateral lower extremities. Negative  for open lesions or macerations.    Vascular: Palpable pedal pulses bilaterally. Capillary refill within normal limits.  There is some very mild erythema and edema localized around the great toe joint of the left foot consistent with possible acute gout  Neurological: Light touch and protective threshold grossly intact  Musculoskeletal Exam: No pedal deformities noted.  Pain on palpation range of motion of the first MTP joint of the left foot  Radiographic Exam LT foot 03/29/2022:  Normal osseous mineralization.  There is some joint space narrowing to the first MTP of the left foot.  No acute fractures identified.  Normal osseous mineralization.  Asymptomatic posterior heel spur also noted on lateral view  Assessment: 1.  First MTP capsulitis/possible acute gout left   Plan of Care:  1. Patient evaluated.  2.  Injection of 0.5 cc Celestone Soluspan injected into the first MTP joint left foot 3.  Continue meloxicam 15 mg daily 4.  Patient has a follow-up appointment with her PCP to discuss the gout of her left foot.  We did discuss the possibility of preventative medication such as allopurinol to prevent the recurrence since she has had multiple episodes within the past year 5.  Return to clinic as needed      Edrick Kins, DPM [facility]  Dr. Edrick Kins, DPM    [address]  [place], [place] [address]                Office [phone]  Fax [phone]

## 2022-05-14 ENCOUNTER — Other Ambulatory Visit (HOSPITAL_COMMUNITY): Payer: Self-pay

## 2022-05-14 ENCOUNTER — Other Ambulatory Visit: Payer: Self-pay | Admitting: Hematology and Oncology

## 2022-05-14 MED ORDER — ZOSTER VAC RECOMB ADJUVANTED 50 MCG/0.5ML IM SUSR
0.5000 mL | INTRAMUSCULAR | 1 refills | Status: DC
  Filled 2022-05-14 – 2022-06-04 (×3): qty 0.5, 1d supply, fill #0
  Filled ????-??-??: fill #1

## 2022-05-14 MED ORDER — LETROZOLE 2.5 MG PO TABS
2.5000 mg | ORAL_TABLET | Freq: Every day | ORAL | 3 refills | Status: DC
  Filled 2022-05-14: qty 90, 90d supply, fill #0
  Filled 2022-08-20: qty 90, 90d supply, fill #1
  Filled 2023-03-04: qty 90, 90d supply, fill #2

## 2022-05-14 MED ORDER — JANUVIA 100 MG PO TABS
100.0000 mg | ORAL_TABLET | Freq: Every day | ORAL | 3 refills | Status: DC
  Filled 2022-05-14: qty 30, 30d supply, fill #0
  Filled 2022-07-02: qty 30, 30d supply, fill #1
  Filled 2022-08-20: qty 30, 30d supply, fill #2
  Filled 2022-09-27: qty 30, 30d supply, fill #3
  Filled 2022-11-04: qty 30, 30d supply, fill #4
  Filled 2022-12-07: qty 30, 30d supply, fill #5
  Filled 2023-01-13: qty 30, 30d supply, fill #6
  Filled 2023-01-21 (×2): qty 30, 30d supply, fill #7
  Filled 2023-01-28: qty 30, 30d supply, fill #6
  Filled 2023-03-04: qty 30, 30d supply, fill #7
  Filled 2023-04-29: qty 30, 30d supply, fill #8

## 2022-05-19 ENCOUNTER — Other Ambulatory Visit (HOSPITAL_COMMUNITY): Payer: Self-pay

## 2022-05-27 ENCOUNTER — Other Ambulatory Visit (HOSPITAL_COMMUNITY): Payer: Self-pay

## 2022-05-27 DIAGNOSIS — E1165 Type 2 diabetes mellitus with hyperglycemia: Secondary | ICD-10-CM | POA: Diagnosis not present

## 2022-05-27 DIAGNOSIS — E11319 Type 2 diabetes mellitus with unspecified diabetic retinopathy without macular edema: Secondary | ICD-10-CM | POA: Diagnosis not present

## 2022-05-27 DIAGNOSIS — E785 Hyperlipidemia, unspecified: Secondary | ICD-10-CM | POA: Diagnosis not present

## 2022-05-27 DIAGNOSIS — E669 Obesity, unspecified: Secondary | ICD-10-CM | POA: Diagnosis not present

## 2022-05-27 DIAGNOSIS — Z0000 Encounter for general adult medical examination without abnormal findings: Secondary | ICD-10-CM | POA: Diagnosis not present

## 2022-05-27 DIAGNOSIS — R82998 Other abnormal findings in urine: Secondary | ICD-10-CM | POA: Diagnosis not present

## 2022-05-27 DIAGNOSIS — Z794 Long term (current) use of insulin: Secondary | ICD-10-CM | POA: Diagnosis not present

## 2022-05-27 DIAGNOSIS — F418 Other specified anxiety disorders: Secondary | ICD-10-CM | POA: Diagnosis not present

## 2022-05-27 DIAGNOSIS — C50411 Malignant neoplasm of upper-outer quadrant of right female breast: Secondary | ICD-10-CM | POA: Diagnosis not present

## 2022-05-27 DIAGNOSIS — G629 Polyneuropathy, unspecified: Secondary | ICD-10-CM | POA: Diagnosis not present

## 2022-05-27 MED ORDER — COLCHICINE 0.6 MG PO TABS
0.6000 mg | ORAL_TABLET | Freq: Every day | ORAL | 0 refills | Status: AC | PRN
  Filled 2022-05-27: qty 30, 30d supply, fill #0

## 2022-05-28 ENCOUNTER — Other Ambulatory Visit (HOSPITAL_COMMUNITY): Payer: Self-pay

## 2022-06-01 ENCOUNTER — Other Ambulatory Visit (HOSPITAL_COMMUNITY): Payer: Self-pay

## 2022-06-04 ENCOUNTER — Other Ambulatory Visit (HOSPITAL_COMMUNITY): Payer: Self-pay

## 2022-06-04 MED ORDER — AREXVY 120 MCG/0.5ML IM SUSR
0.5000 mL | INTRAMUSCULAR | 0 refills | Status: DC
  Filled 2022-06-04: qty 0.5, 1d supply, fill #0

## 2022-06-04 MED ORDER — JARDIANCE 10 MG PO TABS
10.0000 mg | ORAL_TABLET | Freq: Every day | ORAL | 3 refills | Status: DC
  Filled 2022-06-04: qty 90, 90d supply, fill #0
  Filled 2022-09-27: qty 90, 90d supply, fill #1

## 2022-06-07 ENCOUNTER — Ambulatory Visit: Payer: Commercial Managed Care - PPO | Attending: Hematology and Oncology

## 2022-06-07 DIAGNOSIS — Z483 Aftercare following surgery for neoplasm: Secondary | ICD-10-CM | POA: Insufficient documentation

## 2022-06-10 ENCOUNTER — Encounter: Payer: Self-pay | Admitting: Hematology and Oncology

## 2022-06-11 ENCOUNTER — Other Ambulatory Visit (HOSPITAL_COMMUNITY): Payer: Self-pay

## 2022-06-12 ENCOUNTER — Encounter: Payer: Self-pay | Admitting: Hematology and Oncology

## 2022-07-02 ENCOUNTER — Other Ambulatory Visit (HOSPITAL_COMMUNITY): Payer: Self-pay

## 2022-07-02 ENCOUNTER — Encounter: Payer: Self-pay | Admitting: Hematology and Oncology

## 2022-07-02 MED ORDER — INSULIN GLARGINE-YFGN 100 UNIT/ML ~~LOC~~ SOPN
74.0000 [IU] | PEN_INJECTOR | Freq: Two times a day (BID) | SUBCUTANEOUS | 3 refills | Status: DC
  Filled 2022-07-02: qty 45, 60d supply, fill #0
  Filled 2022-09-03: qty 45, 60d supply, fill #1
  Filled 2022-11-09: qty 45, 60d supply, fill #2
  Filled 2023-06-06: qty 45, 60d supply, fill #3

## 2022-07-05 ENCOUNTER — Other Ambulatory Visit (HOSPITAL_COMMUNITY): Payer: Self-pay

## 2022-07-09 ENCOUNTER — Ambulatory Visit (HOSPITAL_BASED_OUTPATIENT_CLINIC_OR_DEPARTMENT_OTHER): Payer: 59 | Admitting: Cardiology

## 2022-07-09 NOTE — Progress Notes (Incomplete)
Cardiology Office Note:    Date:  07/09/2022   ID:  Rachel Gaines, DOB 01-21-1956, MRN [id number]  PCP:  Shon Baton, MD  Cardiologist:  Buford Dresser, MD  Referring MD: Shon Baton, MD   CC: follow up  History of Present Illness:    Rachel Gaines is a 67 y.o. female with a hx of type II diabetes, obesity, arthritis, hyperlipidemia who is seen for follow up. I initially saw her 04/27/19 as a new consult at the request of Shon Baton, MD for the evaluation and management of chest pain.  Today: Since our last visit, diagnose with Covid 03/2020, received infusion. Had been vaccinated, had mild symptoms. Is waiting on a Covid test result from last week, had a runny nose but no fevers/chills. She was told not to come to her appt with Dr. Virgina Jock later today unless negative, but she presented in person to our appt today.  Continues to have occasional chest tightness, mild. Not exertional, not limiting.    Denies shortness of breath at rest or with normal exertion. If she pushes herself with heavy exertion, she gets mild shortness of breath. No PND, orthopnea, LE edema or unexpected weight gain. No syncope or palpitations.   Past Medical History:  Diagnosis Date   Abnormal Pap smear of cervix 2014   ASCUS with negative HR HPV    Anemia    Breast cancer ([place]) 11/2020   Cancer ([facility]) 06/2020   right breast IDC with DCIS   Cataract    Diabetes mellitus without complication ([place])    Hyperlipidemia    on medicine   Personal history of chemotherapy    Personal history of radiation therapy    Plantar fasciitis    STD (sexually transmitted disease)    HSV type II    Past Surgical History:  Procedure Laterality Date   BREAST BIOPSY Right 08/08/2020   BREAST BIOPSY Right 11/27/2020   BREAST LUMPECTOMY Right 12/18/2020   BREAST LUMPECTOMY WITH RADIOACTIVE SEED AND SENTINEL LYMPH NODE BIOPSY Right 12/18/2020   Procedure: RIGHT BREAST LUMPECTOMY WITH RADIOACTIVE SEED AND RIGHT  AXILLARY SENTINEL LYMPH NODE BIOPSY;  Surgeon: Rolm Bookbinder, MD;  Location: [place];  Service: General;  Laterality: Right;   CATARACT EXTRACTION  2019   COLONOSCOPY     COLPOSCOPY W/ BIOPSY / CURETTAGE  02/04/1999   Chronic cervicitis   EYE SURGERY     PORTACATH PLACEMENT N/A 08/26/2020   Procedure: INSERTION PORT-A-CATH;  Surgeon: Rolm Bookbinder, MD;  Location: [place];  Service: General;  Laterality: N/A;  START TIME OF 12:30 PM FOR 60 MINUTES ROOM 8    Current Medications: Current Outpatient Medications on File Prior to Visit  Medication Sig   benzonatate (TESSALON) 200 MG capsule Take 1 capsule (200 mg total) by mouth 3 (three) times daily as needed.   Cholecalciferol (VITAMIN D3) 125 MCG (5000 UT) CAPS Take 5,000 Units by mouth once a week.   colchicine 0.6 MG tablet Take 1 tablet (0.6 mg total) by mouth daily as needed for gout.   colestipol (COLESTID) 5 g granules Take 5 grams by mouth 2 (two) times daily.   Continuous Blood Gluc Sensor (FREESTYLE LIBRE 2 SENSOR) MISC Change every 14 days to monitor blood glucose as directed   diphenoxylate-atropine (LOMOTIL) 2.5-0.025 MG tablet Take 1 tablet by mouth 4 (four) times daily as needed for diarrhea or loose stools   empagliflozin (JARDIANCE) 10 MG TABS tablet Take 1 tablet (10 mg total) by mouth daily.  fluconazole (DIFLUCAN) 150 MG tablet Take 1 tablet (150 mg total) by mouth as needed.   FREESTYLE LITE test strip    gabapentin (NEURONTIN) 300 MG capsule Take 300 mg by mouth 3 (three) times daily.   gabapentin (NEURONTIN) 300 MG capsule Take 1 capsule (300 mg total) by mouth 3 (three) times daily.   insulin detemir (LEVEMIR) 100 UNIT/ML FlexPen INJECT 34 UNITS SUBCUTANEOUSLY TWICE DAILY   Insulin Glargine (BASAGLAR KWIKPEN) 100 UNIT/ML INJECT 34 UNITS UNDER THE SKIN TWICE DAILY (Patient not taking: Reported on 12/05/2020)   insulin glargine-yfgn (SEMGLEE, YFGN,) 100 UNIT/ML Pen Inject 40 Units into the skin in  the morning AND 34 Units at bedtime.   insulin lispro (HUMALOG KWIKPEN) 100 UNIT/ML KwikPen Inject 14 Units into the skin 2 (two) times daily. (Patient taking differently: Inject 16 Units into the skin 2 (two) times daily.)   Insulin Pen Needle 31G X 8 MM MISC Use 1 daily  with Saxenda   Insulin Pen Needle 32G X 4 MM MISC Use with Levemir and Humalog 4 (four) times daily.   letrozole (FEMARA) 2.5 MG tablet Take 1 tablet (2.5 mg total) by mouth daily.   meloxicam (MOBIC) 15 MG tablet Take 1 tablet (15 mg total) by mouth daily.   methylPREDNISolone (MEDROL DOSEPAK) 4 MG TBPK tablet Take as directed   NONFORMULARY OR COMPOUNDED [place]:  Antiinflammatory cream - Diclofenac 3%, Baclofen 2%, Cyclobenzaprine 2%, Lidocaine 2%, dispense 120grams, apply 1-2 grams to affected area 3-4 times a day, +2refills.   Pancrelipase, Lip-Prot-Amyl, (ZENPEP) 40000-126000 units CPEP Take 1 capsule by mouth in the morning and at bedtime.   ramipril (ALTACE) 2.5 MG capsule Take 1 capsule (2.5 mg total) by mouth every other day.   rosuvastatin (CRESTOR) 40 MG tablet TAKE 1 TABLET BY MOUTH ONCE DAILY   RSV vaccine recomb adjuvanted (AREXVY) 120 MCG/0.5ML injection Inject 0.5 mLs into the muscle.   sitaGLIPtin (JANUVIA) 100 MG tablet Take 1 tablet (100 mg total) by mouth daily.   UNIFINE PENTIPS 32G X 4 MM MISC USE WITH LEVEMIR AND HUMALOG 4X A DAY DX-E11.65   valACYclovir (VALTREX) 500 MG tablet Take 1 tablet (500 mg total) by mouth 2 (two) times daily. Take for 3 days for outbreak.   Zoster Vaccine Adjuvanted [facility]) injection Inject 0.5 mLs into the muscle.   Zoster Vaccine Adjuvanted [facility]) injection Inject 0.5 mLs into the muscle.   No current facility-administered medications on file prior to visit.     Allergies:   Penicillins   Social History   Tobacco Use   Smoking status: Never   Smokeless tobacco: Never  Vaping Use   Vaping Use: Never used  Substance Use Topics   Alcohol use: No     Alcohol/week: 0.0 standard drinks of alcohol   Drug use: No    Family History: family history includes Cancer in her brother and father; Diabetes in her brother, father, mother, sister, sister, sister, sister, sister, sister, and sister; Heart disease in her father, sister, and sister; Heart failure in her father and sister; Lupus in her daughter; Thyroid disease in her sister. There is no history of Colon cancer, Colon polyps, Rectal cancer, Stomach cancer, or Esophageal cancer.  father, sister had heart problems. Sister had PM, then ICD, had frequent shocks so they took out his ICD. Passed away in 2015/10/08 in her late 36s. Brother had unknown heart issues, had "dark legs." Another brother with unknown heart issues, has diabetes; she thinks he had stents  placed.  ROS:   Please see the history of present illness.  Additional pertinent ROS otherwise unremarkable.  EKGs/Labs/Other Studies Reviewed:    The following studies were reviewed today: Cardiac CT 06/21/19 1. No evidence of CAD, CADRADS = 0.  2. Coronary calcium score of 0. This was 0 percentile for age and sex matched control.  3. Normal coronary origin with right dominance.  Echo 05/11/19  1. Left ventricular ejection fraction, by visual estimation, is 60 to 65%. The left ventricle has normal function. There is no left ventricular hypertrophy.  2. The left ventricle has no regional wall motion abnormalities.  3. LVEF by 3D assessment 63%.  4. Global right ventricle has normal systolic function.The right ventricular size is normal. No increase in right ventricular wall thickness.  5. Left atrial size was normal.  6. Right atrial size was normal.  7. Presence of pericardial fat pad.  8. Trivial pericardial effusion is present.  9. The mitral valve is grossly normal. No evidence of mitral valve regurgitation. 10. The tricuspid valve is grossly normal. Tricuspid valve regurgitation is not demonstrated. 11. The aortic valve is  tricuspid. Aortic valve regurgitation is not visualized. No evidence of aortic valve sclerosis or stenosis. 12. The pulmonic valve was grossly normal. Pulmonic valve regurgitation is not visualized. 13. The average left ventricular global longitudinal strain is -21.7 %.  02/24/2000 Nuclear stress FINDINGS CLINICAL DATA:  CHEST PAIN.  FAMILY HISTORY OF CAD. STRESS CARDIOLITE SPECT IMAGING: REST SPECT IMAGES WERE OBTAINED IN THREE PROJECTIONS FOLLOWING IV INJECTION OF 10 mCi 57mc SESTAMIBI.  THE PATIENT EXERCISED ON THE TREADMILL ACCORDING TO THE BRUCE PROTOCOL FOR FIVE MINUTES, AND ACHIEVED A HEART RATE OF 175.   DURING PEAK EXERCISE, 30 mCi 967m SESTAMIBI WAS INJECTED, AND POST STRESS SPECT IMAGES WERE ACQUIRED.  NO REVERSIBLE OR FIXED PERFUSION DEFECTS ARE DETECTED. GATED SPECT WALL MOTION STUDY: LV WALL MOTION IS STUDIED IN THREE PROJECTIONS.  WITH SYSTOLE THERE IS NORMAL LV WALL THICKENING AND COLOR INTENSIFICATION.  NO HYPOKINETIC SEGMENTS ARE NOTED. QUANTITATIVE GATED SPECT STUDY: EDV OF THE LV IS 71 CC.  ESV IS 70 CC.  RESTING LVEF IS 76%. IMPRESSION THE STUDY IS NEGATIVE FOR ISCHEMIA OR INFARCTION.  NORMAL LV WALL MOTION AT REST.  RESTING LVEF IS 76%.  EKG:  EKG is personally reviewed.  The ekg ordered today demonstrates NSR at 68 bpm.  Recent Labs: 07/10/2021: ALT 11; BUN 9; Creatinine 0.74; Hemoglobin 13.5; Platelet Count 243; Potassium 3.9; Sodium 143  Recent Lipid Panel    Component Value Date/Time   CHOL 156 04/27/2019 1033   TRIG 143 04/27/2019 1033   HDL 47 04/27/2019 1033   CHOLHDL 3.3 04/27/2019 1033   LDLCALC 84 04/27/2019 1033    Physical Exam:    VS:  LMP 06/28/2006 (Approximate)     Wt Readings from Last 3 Encounters:  04/19/22 166 lb 9.6 oz (75.6 kg)  02/22/22 169 lb 8 oz (76.9 kg)  01/08/22 170 lb 8 oz (77.3 kg)    GEN: Well nourished, well developed in no acute distress HEENT: Normal, moist mucous membranes NECK: No JVD CARDIAC: regular rhythm,  normal S1 and S2, no rubs or gallops. No murmur. VASCULAR: Radial and DP pulses 2+ bilaterally. No carotid bruits RESPIRATORY:  Clear to auscultation without rales, wheezing or rhonchi  ABDOMEN: Soft, non-tender, non-distended MUSCULOSKELETAL:  Ambulates independently SKIN: Warm and dry, no edema NEUROLOGIC:  Alert and oriented x 3. No focal neuro deficits noted. PSYCHIATRIC:  Normal affect  ASSESSMENT:    No diagnosis found.  PLAN:    Chest pain, dyspnea on exertion: mild, nonlimiting -both cardiac CT and echo have been personally reviewed. -highly suggests that her symptoms are not cardiac related -encouraged her to continue to try to exercise, etc to increase her conditioning level  Type II diabetes on insulin, managed by Dr. Virgina Jock -on ramipril for history of microalbuminuria -on empagliflozin, lantus insulin, sitagliptin -on aspirin, rosuvastatin -per [facility], last A1c 9.3 11/22/19  Mixed hyperlipidemia:  -lipids per [facility] 11/22/19, LDL 83, TG 113  Personal history of Covid-19 -mild symptoms, received infusion 03/2020 -has been vaccinated -awaiting results on a Covid test from last week. Symptom of runny nose. Instructed that our policy is the same as Dr. Keane Police, we do virtual visits while covid tests are pending  Cardiac risk counseling and prevention recommendations: -recommend heart healthy/Mediterranean diet, with whole grains, fruits, vegetable, fish, lean meats, nuts, and olive oil. Limit salt. -recommend moderate walking, 3-5 times/week for 30-50 minutes each session. Aim for at least 150 minutes.week. Goal should be pace of 3 miles/hours, or walking 1.5 miles in 30 minutes -recommend avoidance of tobacco products. Avoid excess alcohol. -ASCVD risk score: The ASCVD Risk score (Arnett DK, et al., 2019) failed to calculate for the following reasons:   Cannot find a previous HDL lab   Cannot find a previous total cholesterol lab    Plan for follow up: 2 years or sooner  PRN  Buford Dresser, MD, PhD, [facility]   Medication Adjustments/Labs and Tests Ordered: Current medicines are reviewed at length with the patient today.  Concerns regarding medicines are outlined above.  No orders of the defined types were placed in this encounter.  No orders of the defined types were placed in this encounter.   There are no Patient Instructions on file for this visit.  Signed, Buford Dresser, MD PhD 07/09/2022  [place]

## 2022-07-15 ENCOUNTER — Other Ambulatory Visit (HOSPITAL_COMMUNITY): Payer: Self-pay

## 2022-07-15 DIAGNOSIS — K5730 Diverticulosis of large intestine without perforation or abscess without bleeding: Secondary | ICD-10-CM | POA: Diagnosis not present

## 2022-07-15 DIAGNOSIS — R152 Fecal urgency: Secondary | ICD-10-CM | POA: Diagnosis not present

## 2022-07-15 DIAGNOSIS — K8681 Exocrine pancreatic insufficiency: Secondary | ICD-10-CM | POA: Diagnosis not present

## 2022-07-15 DIAGNOSIS — R194 Change in bowel habit: Secondary | ICD-10-CM | POA: Diagnosis not present

## 2022-07-15 DIAGNOSIS — K7581 Nonalcoholic steatohepatitis (NASH): Secondary | ICD-10-CM | POA: Diagnosis not present

## 2022-07-15 MED ORDER — ZENPEP 40000-126000 UNITS PO CPEP
ORAL_CAPSULE | ORAL | 0 refills | Status: AC
  Filled 2022-07-15: qty 270, 30d supply, fill #0

## 2022-07-16 ENCOUNTER — Other Ambulatory Visit (HOSPITAL_COMMUNITY): Payer: Self-pay

## 2022-07-19 ENCOUNTER — Other Ambulatory Visit (HOSPITAL_COMMUNITY): Payer: Self-pay

## 2022-07-23 ENCOUNTER — Encounter: Payer: Self-pay | Admitting: Hematology and Oncology

## 2022-07-23 ENCOUNTER — Other Ambulatory Visit (HOSPITAL_COMMUNITY): Payer: Self-pay

## 2022-08-03 IMAGING — MR MR HEAD WO/W CM
13 series · 48 of 48 positions shown · IV contrast (gadavist)
Comparison: No prior

CLINICAL DATA: Dizziness, non-specific hx breast cancer with
dizziness, concern for mets

EXAM:
MRI HEAD WITHOUT AND WITH CONTRAST
TECHNIQUE: Multiplanar, multiecho pulse sequences of the brain and surrounding
structures were obtained without and with intravenous contrast.
CONTRAST:  8mL GADAVIST GADOBUTROL 1 MMOL/ML IV SOLN

[Series 5: DWI · axial · 3.0mm · 1.36mm/px · z∈[-30,+106]mm · 6 of 96 slices shown (1 of 2)]
[im 1/96]
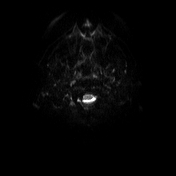
[im 20/96]
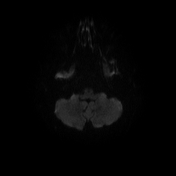
[im 39/96]
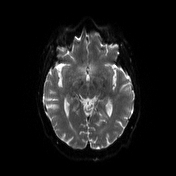
[im 58/96]
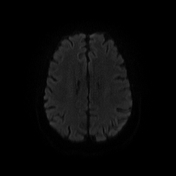
[im 77/96]
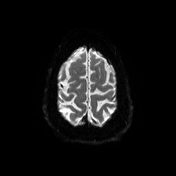
[im 96/96]
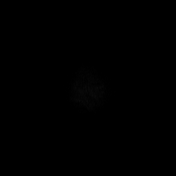

[Series 6: DWI · axial · 3.0mm · 1.36mm/px · z∈[-30,+106]mm · 3 of 48 slices shown (2 of 2)]
[im 1/48]
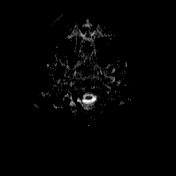
[im 24/48]
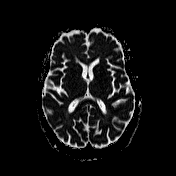
[im 48/48]
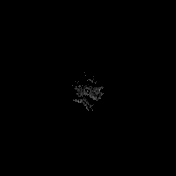

[Series 7: T1 · sagittal · 5.0mm · 0.75mm/px · 2 of 24 slices shown (1 of 2)]
[im 1/24]
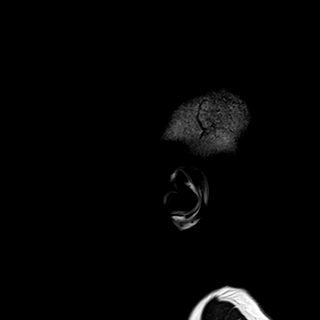
[im 24/24]
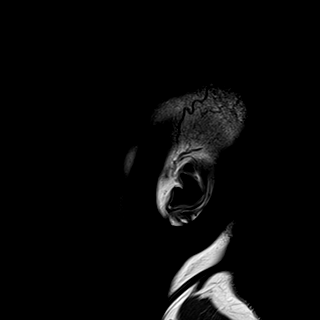

[Series 8: T2 · axial · 5.0mm · 0.62mm/px · z∈[-35,+109]mm · 2 of 24 slices shown]
[im 1/24]
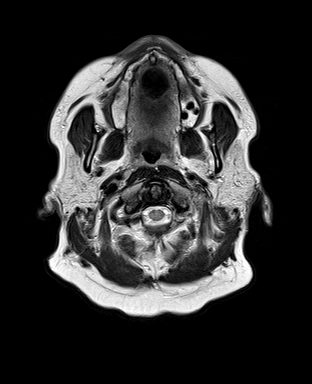
[im 24/24]
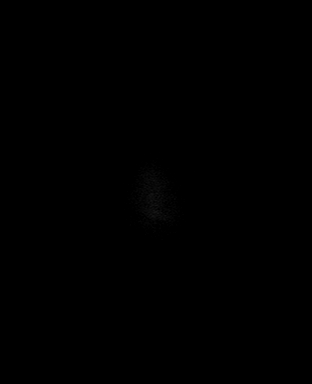

[Series 9: FLAIR · axial · 3.0mm · 0.75mm/px · z∈[-31,+105]mm · 3 of 48 slices shown]
[im 1/48]
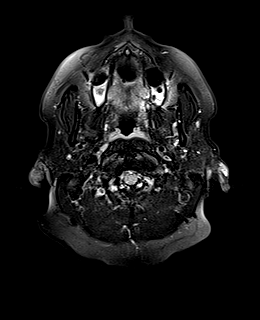
[im 24/48]
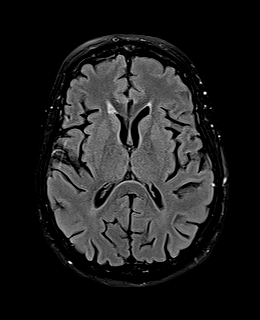
[im 48/48]
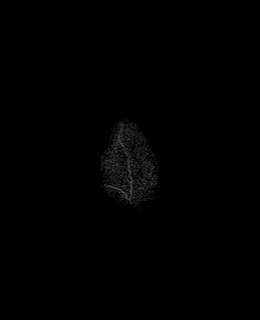

[Series 10: swi_images · axial · 3.0mm · 0.75mm/px · z∈[-37,+111]mm · 3 of 52 slices shown]
[im 1/52]
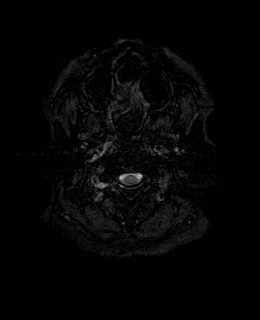
[im 26/52]
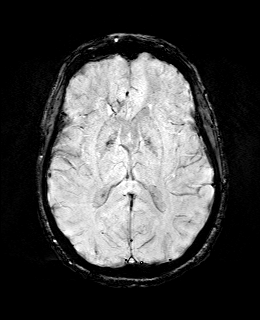
[im 52/52]
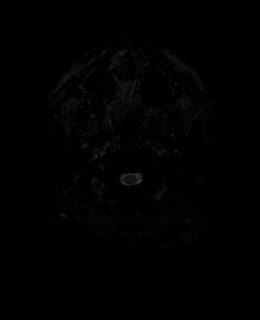

[Series 12: T1 · axial · 1.0mm · 0.94mm/px · z∈[-32,+106]mm · 9 of 144 slices shown (2 of 2)]
[im 1/144]
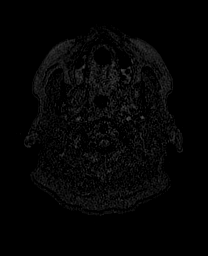
[im 18/144]
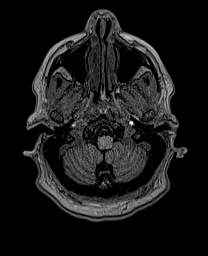
[im 36/144]
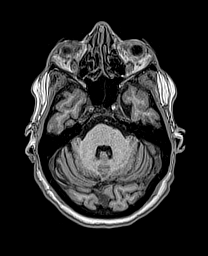
[im 54/144]
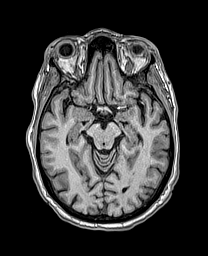
[im 72/144]
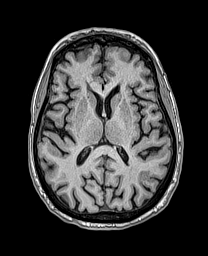
[im 90/144]
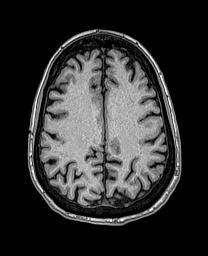
[im 108/144]
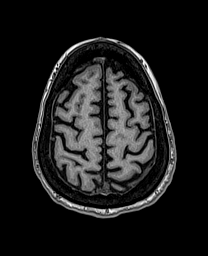
[im 126/144]
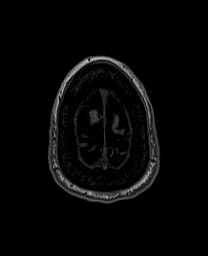
[im 144/144]
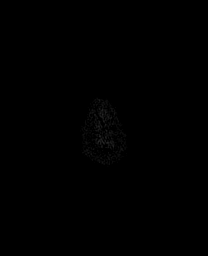

[Series 13: cor dwi_tracew · coronal · 5.0mm · 1.53mm/px · 3 of 52 slices shown]
[im 1/52]
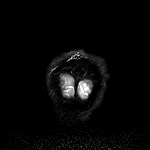
[im 26/52]
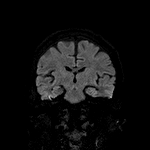
[im 52/52]
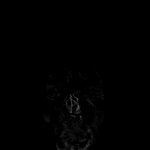

[Series 14: cor dwi_adc · coronal · 5.0mm · 1.53mm/px · 2 of 26 slices shown]
[im 1/26]
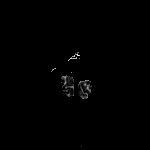
[im 26/26]
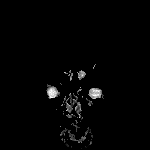

[Series 15: T2 post-contrast · coronal · 5.0mm · 0.57mm/px · 2 of 26 slices shown]
[im 1/26]
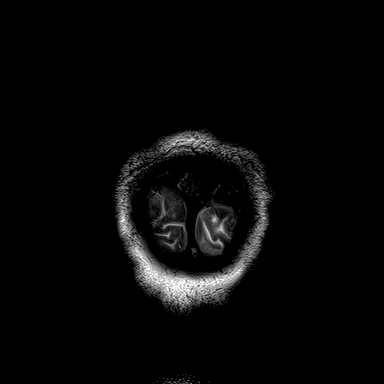
[im 26/26]
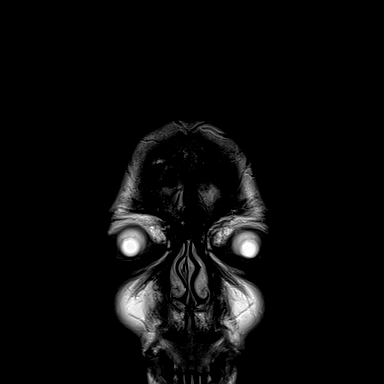

[Series 16: T1 post-contrast · axial · 1.0mm · 0.94mm/px · z∈[-32,+106]mm · 9 of 144 slices shown (1 of 3)]
[im 1/144]
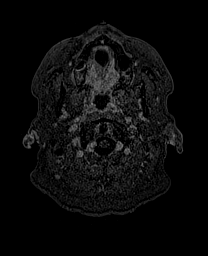
[im 18/144]
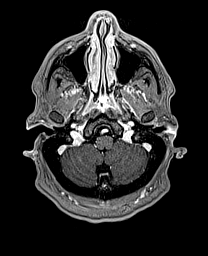
[im 36/144]
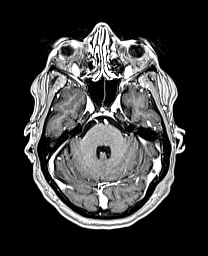
[im 54/144]
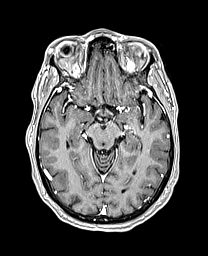
[im 72/144]
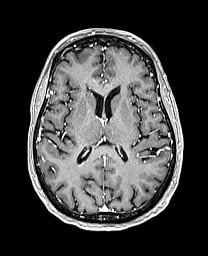
[im 90/144]
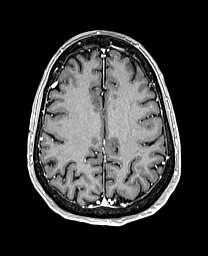
[im 108/144]
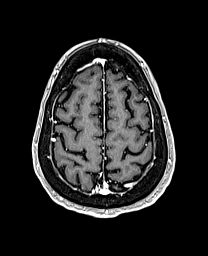
[im 126/144]
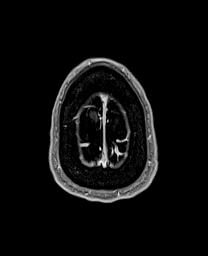
[im 144/144]
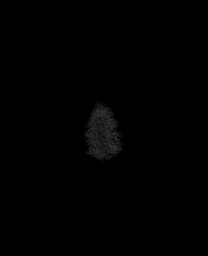

[Series 17: T1 post-contrast · coronal · 5.0mm · 0.43mm/px · 2 of 26 slices shown (2 of 3)]
[im 1/26]
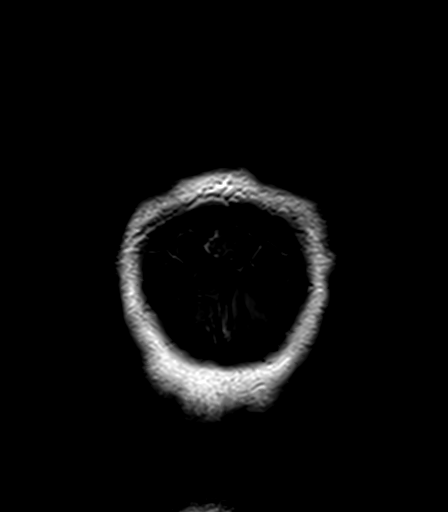
[im 26/26]
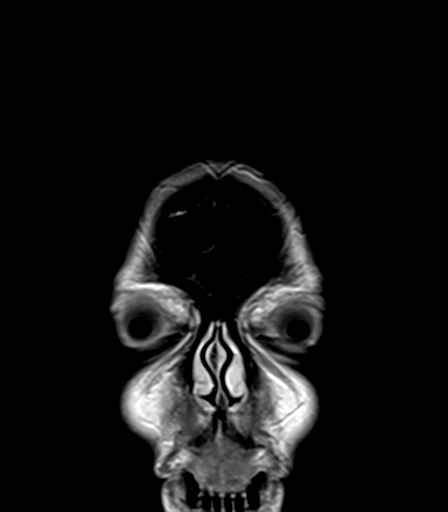

[Series 18: T1 post-contrast · sagittal · 5.0mm · 0.75mm/px · 2 of 24 slices shown (3 of 3)]
[im 1/24]
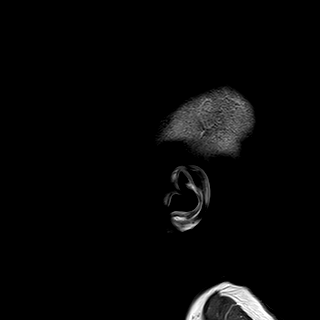
[im 24/24]
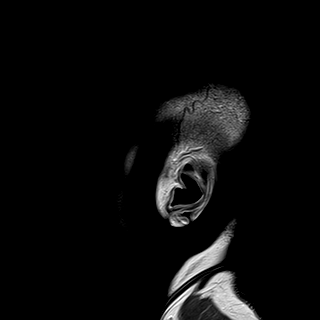

[48 of 48 positions shown; findings below may reference images not displayed]

FINDINGS: Brain: There is no acute infarction or intracranial hemorrhage.
There is no intracranial mass, mass effect, or edema. There is no
hydrocephalus or extra-axial fluid collection. Ventricles and sulci
are within normal limits in size and configuration. Right inferior
frontal developmental venous anomaly is incidentally noted.

Vascular: Major vessel flow voids at the skull base are preserved.

Skull and upper cervical spine: Normal marrow signal is preserved.
Cervical spine degenerative changes.

Sinuses/Orbits: Trace mucosal thickening. Bilateral lens
replacements.

Other: Sella is unremarkable. Minimal patchy mastoid fluid
opacification.
IMPRESSION: No evidence of intracranial metastatic disease.

No evidence of recent infarction or hemorrhage.

## 2022-08-20 ENCOUNTER — Other Ambulatory Visit (HOSPITAL_COMMUNITY): Payer: Self-pay

## 2022-08-20 ENCOUNTER — Encounter: Payer: Self-pay | Admitting: Hematology and Oncology

## 2022-08-23 DIAGNOSIS — H469 Unspecified optic neuritis: Secondary | ICD-10-CM | POA: Diagnosis not present

## 2022-09-10 DIAGNOSIS — E559 Vitamin D deficiency, unspecified: Secondary | ICD-10-CM | POA: Diagnosis not present

## 2022-09-10 DIAGNOSIS — C50411 Malignant neoplasm of upper-outer quadrant of right female breast: Secondary | ICD-10-CM | POA: Diagnosis not present

## 2022-09-10 DIAGNOSIS — G629 Polyneuropathy, unspecified: Secondary | ICD-10-CM | POA: Diagnosis not present

## 2022-09-10 DIAGNOSIS — R159 Full incontinence of feces: Secondary | ICD-10-CM | POA: Diagnosis not present

## 2022-09-10 DIAGNOSIS — E785 Hyperlipidemia, unspecified: Secondary | ICD-10-CM | POA: Diagnosis not present

## 2022-09-10 DIAGNOSIS — E11319 Type 2 diabetes mellitus with unspecified diabetic retinopathy without macular edema: Secondary | ICD-10-CM | POA: Diagnosis not present

## 2022-09-10 DIAGNOSIS — E1165 Type 2 diabetes mellitus with hyperglycemia: Secondary | ICD-10-CM | POA: Diagnosis not present

## 2022-09-10 DIAGNOSIS — F418 Other specified anxiety disorders: Secondary | ICD-10-CM | POA: Diagnosis not present

## 2022-09-10 DIAGNOSIS — M79641 Pain in right hand: Secondary | ICD-10-CM | POA: Diagnosis not present

## 2022-09-10 DIAGNOSIS — Z794 Long term (current) use of insulin: Secondary | ICD-10-CM | POA: Diagnosis not present

## 2022-09-14 DIAGNOSIS — M79641 Pain in right hand: Secondary | ICD-10-CM | POA: Diagnosis not present

## 2022-09-16 DIAGNOSIS — M65341 Trigger finger, right ring finger: Secondary | ICD-10-CM | POA: Diagnosis not present

## 2022-09-27 ENCOUNTER — Ambulatory Visit
Admission: RE | Admit: 2022-09-27 | Discharge: 2022-09-27 | Disposition: A | Discharge status: Home / Self Care | Payer: 59 | Source: Ambulatory Visit | Attending: Adult Health | Admitting: Adult Health

## 2022-09-27 ENCOUNTER — Other Ambulatory Visit: Payer: Self-pay

## 2022-09-27 ENCOUNTER — Encounter: Payer: Self-pay | Admitting: Hematology and Oncology

## 2022-09-27 ENCOUNTER — Other Ambulatory Visit: Payer: Self-pay | Admitting: Nurse Practitioner

## 2022-09-27 ENCOUNTER — Telehealth: Payer: Self-pay

## 2022-09-27 ENCOUNTER — Other Ambulatory Visit (HOSPITAL_COMMUNITY): Payer: Self-pay

## 2022-09-27 DIAGNOSIS — Z853 Personal history of malignant neoplasm of breast: Secondary | ICD-10-CM | POA: Diagnosis not present

## 2022-09-27 DIAGNOSIS — Z780 Asymptomatic menopausal state: Secondary | ICD-10-CM | POA: Diagnosis not present

## 2022-09-27 DIAGNOSIS — C50411 Malignant neoplasm of upper-outer quadrant of right female breast: Secondary | ICD-10-CM

## 2022-09-27 DIAGNOSIS — R928 Other abnormal and inconclusive findings on diagnostic imaging of breast: Secondary | ICD-10-CM | POA: Diagnosis not present

## 2022-09-27 DIAGNOSIS — B009 Herpesviral infection, unspecified: Secondary | ICD-10-CM

## 2022-09-27 MED ORDER — VALACYCLOVIR HCL 500 MG PO TABS
500.0000 mg | ORAL_TABLET | Freq: Two times a day (BID) | ORAL | 0 refills | Status: DC
  Filled 2022-09-27: qty 30, 15d supply, fill #0

## 2022-09-27 NOTE — Telephone Encounter (Signed)
Called and given below message. She verbalized understanding and appreciated the call. 

## 2022-09-27 NOTE — Telephone Encounter (Signed)
Med refill request: Valtrex Last AEX: 08/24/21 Next AEX: not scheduled Last MMG (if hormonal med) 10/09/21 BI-RADS CAT 2 benign Refill authorized: Please Advise, pt requested through Epic

## 2022-09-27 NOTE — Telephone Encounter (Signed)
-----   Message from Gardenia Phlegm, NP sent at 09/27/2022 10:47 AM EDT ----- Regarding: FW: Normal-please call patient t Nd let her know ----- Message ----- From: Interface, Rad Results In Sent: 09/27/2022  10:30 AM EDT To: Gardenia Phlegm, NP

## 2022-10-01 ENCOUNTER — Encounter: Payer: Self-pay | Admitting: Hematology and Oncology

## 2022-10-01 ENCOUNTER — Other Ambulatory Visit (HOSPITAL_COMMUNITY): Payer: Self-pay

## 2022-10-01 DIAGNOSIS — M79641 Pain in right hand: Secondary | ICD-10-CM | POA: Diagnosis not present

## 2022-10-18 ENCOUNTER — Other Ambulatory Visit (HOSPITAL_COMMUNITY): Payer: Self-pay

## 2022-10-29 ENCOUNTER — Other Ambulatory Visit: Payer: Self-pay

## 2022-10-29 DIAGNOSIS — B3731 Acute candidiasis of vulva and vagina: Secondary | ICD-10-CM

## 2022-10-29 DIAGNOSIS — B009 Herpesviral infection, unspecified: Secondary | ICD-10-CM

## 2022-10-29 NOTE — Telephone Encounter (Signed)
Pt calling to request refills on medications pending.   Last AEX 08/24/2021--scheduled for 01/20/2023.  Pt reports TW usually send diflucan in for her for yeast infections d/t reoccurring from DM.

## 2022-11-01 ENCOUNTER — Other Ambulatory Visit (HOSPITAL_COMMUNITY): Payer: Self-pay

## 2022-11-01 MED ORDER — FLUCONAZOLE 150 MG PO TABS
150.0000 mg | ORAL_TABLET | ORAL | 0 refills | Status: DC | PRN
Start: 2022-11-01 — End: 2023-01-20
  Filled 2022-11-01: qty 10, 10d supply, fill #0

## 2022-11-01 MED ORDER — VALACYCLOVIR HCL 500 MG PO TABS
500.0000 mg | ORAL_TABLET | Freq: Two times a day (BID) | ORAL | 0 refills | Status: DC
Start: 2022-11-01 — End: 2023-01-20
  Filled 2022-11-01: qty 30, 15d supply, fill #0

## 2022-11-04 ENCOUNTER — Other Ambulatory Visit: Payer: Self-pay

## 2022-11-04 ENCOUNTER — Other Ambulatory Visit (HOSPITAL_COMMUNITY): Payer: Self-pay

## 2022-11-09 ENCOUNTER — Other Ambulatory Visit: Payer: Self-pay

## 2022-11-30 DIAGNOSIS — K5730 Diverticulosis of large intestine without perforation or abscess without bleeding: Secondary | ICD-10-CM | POA: Diagnosis not present

## 2022-11-30 DIAGNOSIS — R159 Full incontinence of feces: Secondary | ICD-10-CM | POA: Diagnosis not present

## 2022-11-30 DIAGNOSIS — K8681 Exocrine pancreatic insufficiency: Secondary | ICD-10-CM | POA: Diagnosis not present

## 2022-11-30 DIAGNOSIS — K7581 Nonalcoholic steatohepatitis (NASH): Secondary | ICD-10-CM | POA: Diagnosis not present

## 2022-11-30 DIAGNOSIS — R197 Diarrhea, unspecified: Secondary | ICD-10-CM | POA: Diagnosis not present

## 2022-11-30 DIAGNOSIS — R152 Fecal urgency: Secondary | ICD-10-CM | POA: Diagnosis not present

## 2022-12-07 ENCOUNTER — Other Ambulatory Visit (HOSPITAL_COMMUNITY): Payer: Self-pay

## 2022-12-07 MED ORDER — ROSUVASTATIN CALCIUM 40 MG PO TABS
40.0000 mg | ORAL_TABLET | Freq: Every day | ORAL | 2 refills | Status: DC
  Filled 2022-12-07: qty 90, 90d supply, fill #0
  Filled 2023-06-03: qty 90, 90d supply, fill #1
  Filled 2023-10-06: qty 90, 90d supply, fill #2

## 2022-12-10 ENCOUNTER — Other Ambulatory Visit (HOSPITAL_COMMUNITY): Payer: Self-pay

## 2022-12-13 ENCOUNTER — Other Ambulatory Visit (HOSPITAL_COMMUNITY): Payer: Self-pay

## 2023-01-07 ENCOUNTER — Other Ambulatory Visit (HOSPITAL_COMMUNITY): Payer: Self-pay

## 2023-01-07 DIAGNOSIS — E559 Vitamin D deficiency, unspecified: Secondary | ICD-10-CM | POA: Diagnosis not present

## 2023-01-07 DIAGNOSIS — E11319 Type 2 diabetes mellitus with unspecified diabetic retinopathy without macular edema: Secondary | ICD-10-CM | POA: Diagnosis not present

## 2023-01-07 DIAGNOSIS — J309 Allergic rhinitis, unspecified: Secondary | ICD-10-CM | POA: Diagnosis not present

## 2023-01-07 DIAGNOSIS — R079 Chest pain, unspecified: Secondary | ICD-10-CM | POA: Diagnosis not present

## 2023-01-07 DIAGNOSIS — E669 Obesity, unspecified: Secondary | ICD-10-CM | POA: Diagnosis not present

## 2023-01-07 DIAGNOSIS — E1165 Type 2 diabetes mellitus with hyperglycemia: Secondary | ICD-10-CM | POA: Diagnosis not present

## 2023-01-07 DIAGNOSIS — E785 Hyperlipidemia, unspecified: Secondary | ICD-10-CM | POA: Diagnosis not present

## 2023-01-07 DIAGNOSIS — Z794 Long term (current) use of insulin: Secondary | ICD-10-CM | POA: Diagnosis not present

## 2023-01-07 DIAGNOSIS — C50411 Malignant neoplasm of upper-outer quadrant of right female breast: Secondary | ICD-10-CM | POA: Diagnosis not present

## 2023-01-07 DIAGNOSIS — G629 Polyneuropathy, unspecified: Secondary | ICD-10-CM | POA: Diagnosis not present

## 2023-01-07 MED ORDER — GABAPENTIN 300 MG PO CAPS
300.0000 mg | ORAL_CAPSULE | Freq: Three times a day (TID) | ORAL | 3 refills | Status: DC
  Filled 2023-01-07: qty 90, 30d supply, fill #0

## 2023-01-07 MED ORDER — RAMIPRIL 2.5 MG PO CAPS
2.5000 mg | ORAL_CAPSULE | ORAL | 3 refills | Status: DC
  Filled 2023-01-07 – 2023-01-28 (×4): qty 45, 90d supply, fill #0

## 2023-01-07 MED ORDER — INSULIN GLARGINE-YFGN 100 UNIT/ML ~~LOC~~ SOPN
PEN_INJECTOR | SUBCUTANEOUS | 3 refills | Status: DC
  Filled 2023-01-07: qty 45, 60d supply, fill #0
  Filled 2023-03-14: qty 45, 60d supply, fill #1

## 2023-01-07 MED ORDER — JARDIANCE 10 MG PO TABS
10.0000 mg | ORAL_TABLET | Freq: Every day | ORAL | 3 refills | Status: DC
  Filled 2023-01-07: qty 90, 90d supply, fill #0
  Filled 2023-05-12: qty 90, 90d supply, fill #1
  Filled 2023-09-12: qty 90, 90d supply, fill #2
  Filled 2023-12-14: qty 90, 90d supply, fill #3

## 2023-01-10 DIAGNOSIS — F4323 Adjustment disorder with mixed anxiety and depressed mood: Secondary | ICD-10-CM | POA: Diagnosis not present

## 2023-01-13 ENCOUNTER — Other Ambulatory Visit: Payer: Self-pay

## 2023-01-13 ENCOUNTER — Other Ambulatory Visit (HOSPITAL_COMMUNITY): Payer: Self-pay

## 2023-01-13 MED ORDER — INSULIN LISPRO (1 UNIT DIAL) 100 UNIT/ML (KWIKPEN)
14.0000 [IU] | PEN_INJECTOR | Freq: Two times a day (BID) | SUBCUTANEOUS | 3 refills | Status: DC
  Filled 2023-01-13: qty 30, 90d supply, fill #0
  Filled 2023-01-28: qty 18, 56d supply, fill #0
  Filled 2023-05-12: qty 18, 56d supply, fill #1
  Filled 2023-09-12: qty 18, 56d supply, fill #2
  Filled 2023-12-14: qty 27, 84d supply, fill #3

## 2023-01-17 DIAGNOSIS — F4323 Adjustment disorder with mixed anxiety and depressed mood: Secondary | ICD-10-CM | POA: Diagnosis not present

## 2023-01-20 ENCOUNTER — Other Ambulatory Visit (HOSPITAL_COMMUNITY)
Admission: RE | Admit: 2023-01-20 | Discharge: 2023-01-20 | Disposition: A | Discharge status: Home / Self Care | Payer: 59 | Source: Ambulatory Visit | Attending: Nurse Practitioner | Admitting: Nurse Practitioner

## 2023-01-20 ENCOUNTER — Encounter: Payer: Self-pay | Admitting: Nurse Practitioner

## 2023-01-20 ENCOUNTER — Ambulatory Visit (INDEPENDENT_AMBULATORY_CARE_PROVIDER_SITE_OTHER): Payer: 59 | Admitting: Nurse Practitioner

## 2023-01-20 ENCOUNTER — Other Ambulatory Visit (HOSPITAL_COMMUNITY): Payer: Self-pay

## 2023-01-20 VITALS — BP 126/72 | HR 76 | Ht 63.25 in | Wt 172.0 lb

## 2023-01-20 DIAGNOSIS — Z124 Encounter for screening for malignant neoplasm of cervix: Secondary | ICD-10-CM

## 2023-01-20 DIAGNOSIS — Z170 Estrogen receptor positive status [ER+]: Secondary | ICD-10-CM

## 2023-01-20 DIAGNOSIS — Z01419 Encounter for gynecological examination (general) (routine) without abnormal findings: Secondary | ICD-10-CM | POA: Diagnosis not present

## 2023-01-20 DIAGNOSIS — Z780 Asymptomatic menopausal state: Secondary | ICD-10-CM

## 2023-01-20 DIAGNOSIS — B009 Herpesviral infection, unspecified: Secondary | ICD-10-CM

## 2023-01-20 DIAGNOSIS — C50411 Malignant neoplasm of upper-outer quadrant of right female breast: Secondary | ICD-10-CM

## 2023-01-20 MED ORDER — VALACYCLOVIR HCL 500 MG PO TABS
500.0000 mg | ORAL_TABLET | Freq: Two times a day (BID) | ORAL | 2 refills | Status: DC
Start: 2023-01-20 — End: 2024-02-17
  Filled 2023-01-20 – 2023-02-25 (×2): qty 30, 15d supply, fill #0

## 2023-01-20 NOTE — Progress Notes (Signed)
Rachel Gaines [birth year] [id number]   History:  67 y.o. [id number] presents for annual exam. Postmenopausal - no HRT, no bleeding. Normal pap history. 07/2020 right breast cancer, triple positive managed with lumpectomy, chemo, radiation, and currently on Letrozole. History of DM, HLD. HSV, rare outbreaks.   Gynecologic History Patient's last menstrual period was 06/28/2006 (approximate).   Contraception/Family planning: post menopausal status Sexually active: No  Health Maintenance Last Pap: 01/06/2018. Results were: Normal neg HPV, 5-year repeat Last mammogram: 09/27/2022. Results were: Normal Last colonoscopy: 05/05/2018. Results were: Polyps, 5-year recall Last Dexa: 09/27/2022. Results were: Normal  Past medical history, past surgical history, family history and social history were all reviewed and documented in the EPIC chart. Single. 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 Daughter in [place], estranged. Son is local, lives with her, unemployed, has 33 yo daughter and 87 yo son. She keeps them Monday-Thursday.   ROS:  A ROS was performed and pertinent positives and negatives are included.  Exam:  Vitals:   01/20/23 1557  BP: 126/72  Pulse: 76  SpO2: 97%  Weight: 172 lb (78 kg)  Height: 5' 3.25" (1.607 m)    Body mass index is 30.23 kg/m.  General appearance:  Normal Thyroid:  Symmetrical, normal in size, without palpable masses or nodularity. Respiratory  Auscultation:  Clear without wheezing or rhonchi Cardiovascular  Auscultation:  Regular rate, without rubs, murmurs or gallops  Edema/varicosities:  Not grossly evident Abdominal  Soft,nontender, without masses, guarding or rebound.  Liver/spleen:  No organomegaly noted  Hernia:  None appreciated  Skin  Inspection:  Grossly normal Breasts: Examined lying and sitting.   Right: Without masses, retractions, nipple discharge or axillary adenopathy. Mild lymphedema present along lower inner breast, some tenderness during exam, no  redness  Left: Without masses, retractions, nipple discharge or axillary adenopathy. Genitourinary   Inguinal/mons:  Normal without inguinal adenopathy  External genitalia:  Normal appearing vulva with no masses, tenderness, or lesions  BUS/Urethra/Skene's glands:  Normal  Vagina:  Normal appearing with normal color and discharge, no lesions. Atrophic changes  Cervix:  Normal appearing without discharge or lesions  Uterus:  Normal in size, shape and contour.  Midline and mobile, nontender  Adnexa/parametria:     Rt: Normal in size, without masses or tenderness.   Lt: Normal in size, without masses or tenderness.  Anus and perineum: Normal  Digital rectal exam: Declines  Patient informed chaperone available to be present for breast and pelvic exam. Patient has requested no chaperone to be present. Patient has been advised what will be completed during breast and pelvic exam.   Assessment/Plan:  67 y.o. [id number] for annual exam.   Well female exam with routine gynecological exam - Education provided on SBEs, importance of preventative screenings, current guidelines, high calcium diet, regular exercise, and multivitamin daily.  Labs with PCP.   Postmenopausal - no HRT, no bleeding  Malignant neoplasm of upper-outer quadrant of right breast in female, estrogen receptor positive (HCC) - 07/2020 right breast cancer, triple positive managed with lumpectomy, chemo, radiation, and currently on Letrozole. UTD on screenings. Sees oncology in October.   HSV (herpes simplex virus) infection - Plan: valACYclovir (VALTREX) 500 MG tablet as needed for outbreaks.   Screening for cervical cancer - Plan: Cytology - PAP( [facility]) - Normal pap history.   Screening for colon cancer - 2019 colonoscopy. Due this year and plans to schedule soon.   Screening for osteoporosis - Normal bone density 09/2022.  Return in 1 year  for annual.     Rachel Mackie DNP, 4:23 PM 01/20/2023

## 2023-01-21 ENCOUNTER — Other Ambulatory Visit (HOSPITAL_COMMUNITY): Payer: Self-pay

## 2023-01-26 ENCOUNTER — Other Ambulatory Visit (HOSPITAL_COMMUNITY): Payer: Self-pay

## 2023-01-28 ENCOUNTER — Other Ambulatory Visit (HOSPITAL_COMMUNITY): Payer: Self-pay

## 2023-01-28 MED ORDER — CLINDAMYCIN HCL 300 MG PO CAPS
300.0000 mg | ORAL_CAPSULE | Freq: Four times a day (QID) | ORAL | 0 refills | Status: DC
  Filled 2023-01-28: qty 30, 8d supply, fill #0

## 2023-01-28 MED ORDER — HYDROCODONE-ACETAMINOPHEN 10-325 MG PO TABS
1.0000 | ORAL_TABLET | Freq: Four times a day (QID) | ORAL | 0 refills | Status: DC | PRN
  Filled 2023-01-28: qty 20, 5d supply, fill #0

## 2023-01-29 ENCOUNTER — Other Ambulatory Visit: Payer: Self-pay

## 2023-01-29 ENCOUNTER — Encounter (HOSPITAL_BASED_OUTPATIENT_CLINIC_OR_DEPARTMENT_OTHER): Payer: Self-pay

## 2023-01-29 ENCOUNTER — Emergency Department (HOSPITAL_BASED_OUTPATIENT_CLINIC_OR_DEPARTMENT_OTHER): Payer: 59 | Admitting: Radiology

## 2023-01-29 ENCOUNTER — Emergency Department (HOSPITAL_BASED_OUTPATIENT_CLINIC_OR_DEPARTMENT_OTHER)
Admission: EM | Admit: 2023-01-29 | Discharge: 2023-01-29 | Disposition: A | Discharge status: Home / Self Care | Payer: 59 | Attending: Emergency Medicine | Admitting: Emergency Medicine

## 2023-01-29 DIAGNOSIS — Z794 Long term (current) use of insulin: Secondary | ICD-10-CM | POA: Insufficient documentation

## 2023-01-29 DIAGNOSIS — I700 Atherosclerosis of aorta: Secondary | ICD-10-CM | POA: Diagnosis not present

## 2023-01-29 DIAGNOSIS — R079 Chest pain, unspecified: Secondary | ICD-10-CM | POA: Diagnosis not present

## 2023-01-29 DIAGNOSIS — R0789 Other chest pain: Secondary | ICD-10-CM | POA: Diagnosis not present

## 2023-01-29 DIAGNOSIS — E119 Type 2 diabetes mellitus without complications: Secondary | ICD-10-CM | POA: Insufficient documentation

## 2023-01-29 DIAGNOSIS — Z7984 Long term (current) use of oral hypoglycemic drugs: Secondary | ICD-10-CM | POA: Insufficient documentation

## 2023-01-29 LAB — CBC
HCT: 40.5 % (ref 36.0–46.0)
Hemoglobin: 13.8 g/dL (ref 12.0–15.0)
MCH: 28 pg (ref 26.0–34.0)
MCHC: 34.1 g/dL (ref 30.0–36.0)
MCV: 82.3 fL (ref 80.0–100.0)
Platelets: 238 10*3/uL (ref 150–400)
RBC: 4.92 MIL/uL (ref 3.87–5.11)
RDW: 13.7 % (ref 11.5–15.5)
WBC: 6.4 10*3/uL (ref 4.0–10.5)
nRBC: 0 % (ref 0.0–0.2)

## 2023-01-29 LAB — BASIC METABOLIC PANEL
Anion gap: 10 (ref 5–15)
BUN: 6 mg/dL — ABNORMAL LOW (ref 8–23)
CO2: 25 mmol/L (ref 22–32)
Calcium: 9.6 mg/dL (ref 8.9–10.3)
Chloride: 106 mmol/L (ref 98–111)
Creatinine, Ser: 0.7 mg/dL (ref 0.44–1.00)
GFR, Estimated: >60 mL/min (ref >60)
Glucose, Bld: 189 mg/dL — ABNORMAL HIGH (ref 70–99)
Potassium: 3.8 mmol/L (ref 3.5–5.1)
Sodium: 141 mmol/L (ref 135–145)

## 2023-01-29 LAB — TROPONIN I (HIGH SENSITIVITY): Troponin I (High Sensitivity): 3 ng/L (ref <18)

## 2023-01-29 MED ORDER — FAMOTIDINE 20 MG PO TABS: 20.0000 mg | ORAL_TABLET | Freq: Two times a day (BID) | ORAL | 0 refills | Status: AC

## 2023-01-29 NOTE — ED Notes (Signed)
Pt d/c home per MD order. Discharge summary reviewed with pt, pt verbalizes understanding. No s/s of acute distress noted at discharge.  

## 2023-01-29 NOTE — ED Triage Notes (Signed)
Patient arrives with complaints of chest tightness that started overnight and worsened this morning. Rates tightness a 6/10.

## 2023-01-29 NOTE — ED Provider Notes (Signed)
[place] EMERGENCY DEPARTMENT AT [facility] Provider Note   CSN: [id number] Arrival date & time: 01/29/23  1306     History  Chief Complaint  Patient presents with   Chest Pain    Rachel Gaines is a 67 y.o. female with a history of high cholesterol, diabetes, presenting to the ED with complaint of chest discomfort.  Patient reports she has noted episodes of epigastric chest discomfort ongoing for several weeks.  These can occur at random, often at rest, sometimes with exertion.  She denies associated shortness of breath or lightheadedness.  She said this morning when she woke up from sleep she was having more significant chest discomfort than typical.  It is near his epigastrium.  It feels like pressure.  It does not radiate anywhere.  She denies shortness of breath, diaphoresis.  The pain is since gone away completely.  She went to an urgent care and was referred to the ED for further evaluation.  She does report a significant family history of MI and coronary disease in multiple family members.  She reports that she does have diabetes and high cholesterol but does not have high blood pressure, does not smoke, and has no known coronary history.  Per my review of her medical records the patient had a coronary CT scan performed in June 21 2019 (Dr Cristal Deer cardiologist), with a coronary calcium score of 0.  HPI     Home Medications Prior to Admission medications   Medication Sig Start Date End Date Taking? Authorizing Provider  famotidine (PEPCID) 20 MG tablet Take 1 tablet (20 mg total) by mouth 2 (two) times daily. 01/29/23 02/28/23 Yes , Kermit Balo, MD  benzonatate (TESSALON) 200 MG capsule Take 1 capsule (200 mg total) by mouth 3 (three) times daily as needed. 03/19/21   Serena Croissant, MD  clindamycin (CLEOCIN) 300 MG capsule Take 1 capsule (300 mg total) by mouth every 6 (six) hours. 01/28/23     colchicine 0.6 MG tablet Take 1 tablet (0.6 mg total) by mouth daily  as needed for gout. 05/27/22     colestipol (COLESTID) 5 g granules Take 5 grams by mouth 2 (two) times daily. 01/23/21   Loa Socks, NP  Continuous Glucose Sensor (FREESTYLE LIBRE 2 SENSOR) MISC Change every 14 days to monitor blood glucose as directed 03/25/22   Creola Corn, MD  diphenoxylate-atropine (LOMOTIL) 2.5-0.025 MG tablet Take 1 tablet by mouth 4 (four) times daily as needed for diarrhea or loose stools 02/01/22   Serena Croissant, MD  empagliflozin (JARDIANCE) 10 MG TABS tablet Take 1 tablet (10 mg total) by mouth daily. 06/04/22     empagliflozin (JARDIANCE) 10 MG TABS tablet Take 1 tablet (10 mg total) by mouth daily. 01/07/23     FREESTYLE LITE test strip  03/20/19   [provider]  gabapentin (NEURONTIN) 300 MG capsule Take 300 mg by mouth 3 (three) times daily.    [provider]  gabapentin (NEURONTIN) 300 MG capsule Take 1 capsule (300 mg total) by mouth 3 (three) times daily. 01/07/23     HYDROcodone-acetaminophen (NORCO) 10-325 MG tablet Take 1 tablet every 6 hours as needed for pain 01/28/23     insulin detemir (LEVEMIR) 100 UNIT/ML FlexPen INJECT 34 UNITS SUBCUTANEOUSLY TWICE DAILY 08/28/20 08/28/21  Creola Corn, MD  Insulin Glargine [facility] KWIKPEN) 100 UNIT/ML INJECT 34 UNITS UNDER THE SKIN TWICE DAILY Patient not taking: Reported on 12/05/2020 10/26/19 10/25/20  Creola Corn, MD  insulin glargine-yfgn [facility],)  100 UNIT/ML Pen Inject 40 Units into the skin in the morning AND 34 Units at bedtime. 07/02/22     insulin glargine-yfgn (SEMGLEE, YFGN,) 100 UNIT/ML Pen Inject 40 Units into the skin in the morning AND 34 Units at bedtime. 01/07/23     insulin lispro (HUMALOG KWIKPEN) 100 UNIT/ML KwikPen Inject 14-16 Units into the skin 2 (two) times daily as directed 01/13/23     Insulin Pen Needle 31G X 8 MM MISC Use 1 daily  with Saxenda 04/12/22     Insulin Pen Needle 32G X 4 MM MISC Use with Levemir and Humalog 4 (four) times daily. 12/24/21     letrozole  (FEMARA) 2.5 MG tablet Take 1 tablet (2.5 mg total) by mouth daily. 05/14/22   Serena Croissant, MD  NONFORMULARY OR COMPOUNDED ITEM [facility]:  Antiinflammatory cream - Diclofenac 3%, Baclofen 2%, Cyclobenzaprine 2%, Lidocaine 2%, dispense 120grams, apply 1-2 grams to affected area 3-4 times a day, +2refills. 09/26/15   Vivi Barrack, DPM  Pancrelipase, Lip-Prot-Amyl, (ZENPEP) 40000-126000 units CPEP Take 1 capsule by mouth in the morning and at bedtime. 09/05/20   Serena Croissant, MD  ramipril (ALTACE) 2.5 MG capsule Take 1 capsule (2.5 mg total) by mouth every other day. 04/05/22     ramipril (ALTACE) 2.5 MG capsule Take 1 capsule (2.5 mg total) by mouth every other day. 01/07/23     rosuvastatin (CRESTOR) 40 MG tablet Take 1 tablet (40 mg total) by mouth daily. 12/07/22     RSV vaccine recomb adjuvanted (AREXVY) 120 MCG/0.5ML injection Inject 0.5 mLs into the muscle. Patient not taking: Reported on 01/20/2023 06/04/22   Judyann Munson, MD  sitaGLIPtin (JANUVIA) 100 MG tablet Take 1 tablet (100 mg total) by mouth daily. 05/14/22     UNIFINE PENTIPS 32G X 4 MM MISC USE WITH LEVEMIR AND HUMALOG 4X A DAY DX-E11.65 09/24/19   [provider]  valACYclovir (VALTREX) 500 MG tablet Take 1 tablet (500 mg total) by mouth 2 (two) times daily for 3 days for outbreak. 01/20/23   [name], NP  Zoster Vaccine Adjuvanted [facility]) injection Inject 0.5 mLs into the muscle. Patient not taking: Reported on 01/20/2023 02/12/22   Judyann Munson, MD  Zoster Vaccine Adjuvanted [facility]) injection Inject 0.5 mLs into the muscle. Patient not taking: Reported on 01/20/2023 05/14/22   Judyann Munson, MD      Allergies    Penicillins    Review of Systems   Review of Systems  Physical Exam Updated Vital Signs BP 124/65   Pulse 65   Temp 99.1 F (37.3 C)   Resp 16   Ht 5\' 3"  (1.6 m)   Wt 71.7 kg   LMP 06/28/2006 (Approximate)   SpO2 94%   BMI 27.99 kg/m  Physical Exam Constitutional:       General: She is not in acute distress. HENT:     Head: Normocephalic and atraumatic.  Eyes:     Conjunctiva/sclera: Conjunctivae normal.     Pupils: Pupils are equal, round, and reactive to light.  Cardiovascular:     Rate and Rhythm: Normal rate and regular rhythm.  Pulmonary:     Effort: Pulmonary effort is normal. No respiratory distress.  Abdominal:     General: There is no distension.     Tenderness: There is no abdominal tenderness.  Skin:    General: Skin is warm and dry.  Neurological:     General: No focal deficit present.     Mental Status:  She is alert. Mental status is at baseline.  Psychiatric:        Mood and Affect: Mood normal.        Behavior: Behavior normal.     ED Results / Procedures / Treatments   Labs (all labs ordered are listed, but only abnormal results are displayed) Labs Reviewed  BASIC METABOLIC PANEL - Abnormal; Notable for the following components:      Result Value   Glucose, Bld 189 (*)    BUN 6 (*)    All other components within normal limits  CBC  TROPONIN I (HIGH SENSITIVITY)    EKG EKG Interpretation Date/Time:  Saturday January 29 2023 13:19:04 EDT Ventricular Rate:  78 PR Interval:  142 QRS Duration:  70 QT Interval:  372 QTC Calculation: 424 R Axis:   37  Text Interpretation: Normal sinus rhythm When compared with ECG of 13-Aug-2000 14:46, No significant change was found Confirmed by Alvester Chou [phone]) on 01/29/2023 1:35:04 PM  Radiology DG Chest 2 View  Result Date: 01/29/2023 CLINICAL DATA:  Chest pain and chest tightness EXAM: CHEST - 2 VIEW COMPARISON:  08/26/2020 FINDINGS: Heart size is normal. There is aortic atherosclerotic calcification. The lungs are clear. The vascularity is normal. No effusions. No abnormal bone finding. Electronically Signed   By: Paulina Fusi M.D.   On: 01/29/2023 14:14    Procedures Procedures    Medications Ordered in ED Medications - No data to display  ED Course/ Medical  Decision Making/ A&P                                 Medical Decision Making Amount and/or Complexity of Data Reviewed Labs: ordered. Radiology: ordered.   This patient presents to the ED with concern for chest tightness. This involves an extensive number of treatment options, and is a complaint that carries with it a high risk of complications and morbidity.  The differential diagnosis includes reflux versus ACS versus pneumonia versus pleural effusion versus other  Co-morbidities that complicate the patient evaluation: Cardiovascular risk factors noted above, high cholesterol, family history, diabetes  External records from outside source obtained and reviewed including coronary CT scan approximately 3 years ago with no emergent findings, calcium score of 0  I ordered and personally interpreted labs.  The pertinent results include: No emergent findings  I ordered imaging studies including x-ray of the chest I independently visualized and interpreted imaging which showed no emergent findings I agree with the radiologist interpretation  The patient was maintained on a cardiac monitor.  I personally viewed and interpreted the cardiac monitored which showed an underlying rhythm of: Sinus rhythm  Per my interpretation the patient's ECG shows sinus rhythm no acute ischemic findings  Patient was not requiring any medications for chest pain as she was not having active chest discomfort  I have reviewed the patients home medicines and have made adjustments as needed  Test Considered: Low suspicion for acute PE.  No acute risk factors, no persistent chest pain, no tachycardia or hypoxia.  I do not see an indication for CT angiogram at this time   After the interventions noted above, I reevaluated the patient and found that they have: stayed the same  - asymptomatic   Dispostion:  After consideration of the diagnostic results and the patients response to treatment, I feel that the patent  would benefit from outpatient follow-up.    Explained it is  possible she is having gastritis or reflux type symptoms given that her discomfort is epigastric and woke her from sleep.  We can try Pepcid for the next 30 days.  She is willing to do so.  However he made clear that a single workup in the ER does not rule out all potential serious medical conditions including heart conditions, and I would encourage her to follow-up with her cardiologist.  It has been over 3 years since her coronary CT scan and she may need further cardiac workup if her symptoms persist.  But at this point, with a negative troponin and normal EKG and no chest pain, I do not see an indication for hospitalization.  She is comfortable going home.         Final Clinical Impression(s) / ED Diagnoses Final diagnoses:  Chest pain, unspecified type    Rx / DC Orders ED Discharge Orders          Ordered    famotidine (PEPCID) 20 MG tablet  2 times daily        01/29/23 1436              Terald Sleeper, MD 01/29/23 1438

## 2023-01-30 DIAGNOSIS — R079 Chest pain, unspecified: Secondary | ICD-10-CM | POA: Diagnosis not present

## 2023-01-31 DIAGNOSIS — F4323 Adjustment disorder with mixed anxiety and depressed mood: Secondary | ICD-10-CM | POA: Diagnosis not present

## 2023-02-14 DIAGNOSIS — F4323 Adjustment disorder with mixed anxiety and depressed mood: Secondary | ICD-10-CM | POA: Diagnosis not present

## 2023-02-25 ENCOUNTER — Other Ambulatory Visit (HOSPITAL_COMMUNITY): Payer: Self-pay

## 2023-03-04 ENCOUNTER — Other Ambulatory Visit (HOSPITAL_COMMUNITY): Payer: Self-pay

## 2023-03-04 ENCOUNTER — Telehealth: Payer: Self-pay

## 2023-03-04 MED ORDER — CLINDAMYCIN HCL 300 MG PO CAPS
300.0000 mg | ORAL_CAPSULE | Freq: Four times a day (QID) | ORAL | 0 refills | Status: DC
  Filled 2023-03-04: qty 30, 8d supply, fill #0

## 2023-03-04 MED ORDER — HYDROCODONE-ACETAMINOPHEN 10-325 MG PO TABS
1.0000 | ORAL_TABLET | Freq: Four times a day (QID) | ORAL | 0 refills | Status: DC | PRN
  Filled 2023-03-04: qty 20, 5d supply, fill #0

## 2023-03-04 NOTE — Telephone Encounter (Signed)
Pt called to ask if letrozole could be causing diarrhea. She reports her gastroenterologist told her it could be this medication. Advised pt this is an unlikely symptom of Letrozole and encouraged the use of OTC imodium as directed. She is going to try this and follow up with Dr Pamelia Hoit as scheduled 04/21/23

## 2023-03-07 DIAGNOSIS — F4323 Adjustment disorder with mixed anxiety and depressed mood: Secondary | ICD-10-CM | POA: Diagnosis not present

## 2023-03-14 ENCOUNTER — Other Ambulatory Visit (HOSPITAL_COMMUNITY): Payer: Self-pay

## 2023-03-15 ENCOUNTER — Telehealth: Payer: Self-pay

## 2023-03-15 NOTE — Telephone Encounter (Signed)
Pt called to find out what her breast cancer dx was as well as the stage. She was provided this information. Pt verbalized thanks and understanding.

## 2023-04-19 ENCOUNTER — Encounter (HOSPITAL_BASED_OUTPATIENT_CLINIC_OR_DEPARTMENT_OTHER): Payer: Self-pay | Admitting: Cardiology

## 2023-04-19 ENCOUNTER — Ambulatory Visit (HOSPITAL_BASED_OUTPATIENT_CLINIC_OR_DEPARTMENT_OTHER): Payer: 59 | Admitting: Cardiology

## 2023-04-19 ENCOUNTER — Other Ambulatory Visit (HOSPITAL_BASED_OUTPATIENT_CLINIC_OR_DEPARTMENT_OTHER): Payer: Self-pay

## 2023-04-19 VITALS — BP 130/68 | HR 67 | Ht 64.0 in | Wt 174.0 lb

## 2023-04-19 DIAGNOSIS — Z712 Person consulting for explanation of examination or test findings: Secondary | ICD-10-CM | POA: Diagnosis not present

## 2023-04-19 DIAGNOSIS — E118 Type 2 diabetes mellitus with unspecified complications: Secondary | ICD-10-CM

## 2023-04-19 DIAGNOSIS — R1013 Epigastric pain: Secondary | ICD-10-CM

## 2023-04-19 DIAGNOSIS — E782 Mixed hyperlipidemia: Secondary | ICD-10-CM | POA: Diagnosis not present

## 2023-04-19 DIAGNOSIS — Z794 Long term (current) use of insulin: Secondary | ICD-10-CM | POA: Diagnosis not present

## 2023-04-19 MED ORDER — COMIRNATY 30 MCG/0.3ML IM SUSY
0.3000 mL | PREFILLED_SYRINGE | Freq: Once | INTRAMUSCULAR | 0 refills | Status: AC
  Filled 2023-04-19: qty 0.3, 1d supply, fill #0

## 2023-04-19 NOTE — Patient Instructions (Signed)
Medication Instructions:   NO CHANGES   *If you need a refill on your cardiac medications before your next appointment, please call your pharmacy*   Lab Work: NONE   If you have labs (blood work) drawn today and your tests are completely normal, you will receive your results only by: MyChart Message (if you have MyChart) OR A paper copy in the mail If you have any lab test that is abnormal or we need to change your treatment, we will call you to review the results.   Testing/Procedures: NONE   Follow-Up: At [facility], you and your health needs are our priority.  As part of our continuing mission to provide you with exceptional heart care, we have created designated Provider Care Teams.  These Care Teams include your primary Cardiologist (physician) and Advanced Practice Providers (APPs -  Physician Assistants and Nurse Practitioners) who all work together to provide you with the care you need, when you need it.  We recommend signing up for the patient portal called "MyChart".  Sign up information is provided on this After Visit Summary.  MyChart is used to connect with patients for Virtual Visits (Telemedicine).  Patients are able to view lab/test results, encounter notes, upcoming appointments, etc.  Non-urgent messages can be sent to your provider as well.   To learn more about what you can do with MyChart, go to [email or web address].    Your next appointment:   1 year(s)  The format for your next appointment:   In Person  Provider:   Jodelle Red, MD

## 2023-04-19 NOTE — Progress Notes (Signed)
Cardiology Office Note:  .    Date:  04/19/2023  ID:  Rachel Gaines, DOB [birth year], MRN [id number] PCP: Creola Corn, MD  [facility] Providers Cardiologist:  Jodelle Red, MD     History of Present Illness: .    Rachel Gaines is a 67 y.o. female with a hx of type II diabetes, obesity, arthritis, hyperlipidemia, who is seen for follow up. I initially saw her 04/27/19 as a new consult at the request of Creola Corn, MD for the evaluation and management of chest pain.   At her visit 06/2020, she reported a diagnosis with COVID in 03/2020, received infusion. She had been vaccinated and had mild symptoms. She continued to have occasional chest tightness, non-exertional and not limiting.   She was diagnosed with breast cancer and underwent chemotherapy and subsequent right breast lumpectomy 12/18/2020.  On 01/29/2023 she presented to the ED complaining of epigastric chest discomfort for several weeks, often waking her from sleep. She had no active chest pain and troponin was negative. EKG showed NSR at 78 bpm. She was advised to try Pepcid for 30 days and follow-up with outpatient cardiology.  Today, she complains of a chest tightness in her left chest, which has been occurring 4 times a week, ongoing for a while now. The pain seems to randomly occur with no clear pattern: sometimes at rest, at work, or while home when walking up the stairs. Since her pain had subsided for a time, she has not tried the Pepcid.  At times she has had other acid reflux symptoms which is relieved with Tums. Typically she stays away from spicy foods as she is also struggling with intermittent diarrhea since her chemotherapy. She has been treating her diarrhea with OTC medications. She states that usually she doesn't eat much.  She is scheduled to follow-up with her PCP in November with planned lab work.  She denies any palpitations, shortness of breath, peripheral edema, lightheadedness, headaches,  syncope, orthopnea, or PND.  ROS:  Please see the history of present illness. ROS otherwise negative except as noted.  (+) Left chest tightness (+) Intermittent acid reflux (+) Intermittent diarrhea  Studies Reviewed: Marland Kitchen         Chest X-Ray  01/29/2023: FINDINGS: Heart size is normal. There is aortic atherosclerotic calcification. The lungs are clear. The vascularity is normal. No effusions. No abnormal bone finding.  Physical Exam:    VS:  BP 130/68   Pulse 67   Ht 5\' 4"  (1.626 m)   Wt 174 lb (78.9 kg)   LMP 06/28/2006 (Approximate)   SpO2 96%   BMI 29.87 kg/m    Wt Readings from Last 3 Encounters:  04/19/23 174 lb (78.9 kg)  01/29/23 158 lb (71.7 kg)  01/20/23 172 lb (78 kg)    GEN: Well nourished, well developed in no acute distress HEENT: Normal, moist mucous membranes NECK: No JVD CARDIAC: regular rhythm, normal S1 and S2, no rubs or gallops. No murmur. VASCULAR: Radial and DP pulses 2+ bilaterally. No carotid bruits RESPIRATORY:  Clear to auscultation without rales, wheezing or rhonchi  ABDOMEN: Soft, non-tender, non-distended MUSCULOSKELETAL:  Ambulates independently SKIN: Warm and dry, no edema NEUROLOGIC:  Alert and oriented x 3. No focal neuro deficits noted. PSYCHIATRIC:  Normal affect   ASSESSMENT AND PLAN: .    Epigastric pain -recent ER visit with normal hsTn, reviewed tests. Improves with antacid, Did not try acid reducer -both cardiac CT and echo have been personally reviewed. (CT with Ca  score 0 and no CAD) -highly suggests that her symptoms are not cardiac related -reviewed red flag warning signs that need immediate medical attention   Type II diabetes on insulin, managed by Dr. Timothy Lasso -on ramipril for history of microalbuminuria -on empagliflozin, lantus insulin, sitagliptin -on aspirin, rosuvastatin -per [facility], last A1c 9.1 -discussed indications for aspirin today. She was previously told to stop aspirin from her oncologist, defer to them, but as  this is for primary prevention and she has no CAD, no aspirin is reasonable   Mixed hyperlipidemia:  -last LDL 70, due for labs with Dr. Timothy Lasso next month -continue rosuvastatin 40 mg daily.   Cardiac risk counseling and prevention recommendations: -recommend heart healthy/Mediterranean diet, with whole grains, fruits, vegetable, fish, lean meats, nuts, and olive oil. Limit salt. -recommend moderate walking, 3-5 times/week for 30-50 minutes each session. Aim for at least 150 minutes.week. Goal should be pace of 3 miles/hours, or walking 1.5 miles in 30 minutes -recommend avoidance of tobacco products. Avoid excess alcohol. -ASCVD risk score: The ASCVD Risk score (Arnett DK, et al., 2019) failed to calculate for the following reasons:   Cannot find a previous HDL lab   Cannot find a previous total cholesterol lab   Dispo: Follow-up in 1 year, or sooner as needed.  I,Mathew Stumpf,acting as a Neurosurgeon for [employer], MD.,have documented all relevant documentation on the behalf of Jodelle Red, MD,as directed by  Jodelle Red, MD while in the presence of Jodelle Red, MD.  I, Jodelle Red, MD, have reviewed all documentation for this visit. The documentation on 04/19/23 for the exam, diagnosis, procedures, and orders are all accurate and complete.   Signed, Jodelle Red, MD

## 2023-04-25 ENCOUNTER — Inpatient Hospital Stay: Payer: 59 | Attending: Hematology and Oncology | Admitting: Hematology and Oncology

## 2023-04-25 ENCOUNTER — Other Ambulatory Visit (HOSPITAL_COMMUNITY): Payer: Self-pay

## 2023-04-25 VITALS — BP 152/61 | HR 65 | Temp 97.8°F | Resp 18 | Ht 64.0 in | Wt 173.6 lb

## 2023-04-25 DIAGNOSIS — Z170 Estrogen receptor positive status [ER+]: Secondary | ICD-10-CM | POA: Insufficient documentation

## 2023-04-25 DIAGNOSIS — T451X5A Adverse effect of antineoplastic and immunosuppressive drugs, initial encounter: Secondary | ICD-10-CM | POA: Insufficient documentation

## 2023-04-25 DIAGNOSIS — I890 Lymphedema, not elsewhere classified: Secondary | ICD-10-CM | POA: Insufficient documentation

## 2023-04-25 DIAGNOSIS — G620 Drug-induced polyneuropathy: Secondary | ICD-10-CM | POA: Insufficient documentation

## 2023-04-25 DIAGNOSIS — C50411 Malignant neoplasm of upper-outer quadrant of right female breast: Secondary | ICD-10-CM | POA: Diagnosis not present

## 2023-04-25 DIAGNOSIS — M2560 Stiffness of unspecified joint, not elsewhere classified: Secondary | ICD-10-CM | POA: Insufficient documentation

## 2023-04-25 DIAGNOSIS — Z79811 Long term (current) use of aromatase inhibitors: Secondary | ICD-10-CM | POA: Diagnosis not present

## 2023-04-25 DIAGNOSIS — N951 Menopausal and female climacteric states: Secondary | ICD-10-CM | POA: Diagnosis not present

## 2023-04-25 DIAGNOSIS — R197 Diarrhea, unspecified: Secondary | ICD-10-CM | POA: Insufficient documentation

## 2023-04-25 MED ORDER — LETROZOLE 2.5 MG PO TABS
2.5000 mg | ORAL_TABLET | Freq: Every day | ORAL | 3 refills | Status: DC
  Filled 2023-04-25: qty 90, 90d supply, fill #0

## 2023-04-25 MED ORDER — DIPHENOXYLATE-ATROPINE 2.5-0.025 MG PO TABS
1.0000 | ORAL_TABLET | Freq: Four times a day (QID) | ORAL | 3 refills | Status: DC | PRN
Start: 2023-04-25 — End: 2024-02-13
  Filled 2023-04-25: qty 19, 5d supply, fill #0
  Filled 2023-04-25: qty 11, 3d supply, fill #0
  Filled 2024-02-13: qty 30, 8d supply, fill #1

## 2023-04-25 NOTE — Assessment & Plan Note (Addendum)
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  Neoadjuvant chemotherapy with Taxol and Herceptin weekly x12 followed by Herceptin maintenance completed February 2023 2. breast conserving surgery 09/17/2021:Right breast lumpectomy ([place]): IDC 2.1 cm 6 SLN negative for cancer 3.  Adjuvant radiation completed 02/20/2021 4.  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     Breast cancer surveillance: 1.  Breast exam 04/25/2023: Benign 2. mammogram and bone density  09/27/2022: T-score 0.2: Normal, mammogram benign breast density category B   Return to clinic in 1 year for follow-up

## 2023-04-25 NOTE — Progress Notes (Signed)
Patient Care Team: Creola Corn, MD as PCP - General (Internal Medicine) Jodelle Red, MD as PCP - Cardiology (Cardiology) Dorothy Puffer, MD as Consulting Physician (Radiation Oncology) Serena Croissant, MD as Consulting Physician (Hematology and Oncology) Emelia Loron, MD as Consulting Physician (General Surgery)  DIAGNOSIS:  Encounter Diagnoses  Name Primary?   Malignant neoplasm of upper-outer quadrant of right breast in female, estrogen receptor positive (HCC) Yes   Diarrhea, unspecified type     SUMMARY OF ONCOLOGIC HISTORY: Oncology History  Malignant neoplasm of upper-outer quadrant of right breast in female, estrogen receptor positive (HCC)  08/08/2020 Initial Diagnosis   Screening detected right breast mass 2.2 cm by ultrasound upper outer quadrant biopsy: Grade 2-3 IDC with DCIS ER 60%, PR 20%, Ki-67 15%, HER-2 +3+   08/15/2020 Cancer Staging   Staging form: Breast, AJCC 8th Edition - Clinical stage from 08/15/2020: Stage IB (cT2, cN0, cM0, G3, ER+, PR+, HER2+) - Signed by Serena Croissant, MD on 08/15/2020 Stage prefix: Initial diagnosis   09/12/2020 - 11/14/2020 Neo-Adjuvant Chemotherapy   Neoadjuvant Taxol/Herceptin x 12 weeks   09/17/2020 Surgery   Right breast lumpectomy ([facility]): IDC 2.1 cm 6 SLN negative for cancer   11/21/2020 -  Adjuvant Chemotherapy   Maintenance Herceptin   01/26/2021 - 02/20/2021 Radiation Therapy   Site Technique Total Dose (Gy) Dose per Fx (Gy) Completed Fx Beam Energies  Breast, Right: Breast_Rt 3D 42.56/42.56 2.66 16/16 10X  Breast, Right: Breast_Rt_Bst 3D 8/8 2 4/4 10X     03/17/2021 -  Anti-estrogen oral therapy   Letrozole x 7 years     CHIEF COMPLIANT: Follow-up on letrozole therapy  Discussed the use of AI scribe software for clinical note transcription with the patient, who gave verbal consent to proceed.  History of Present Illness   The patient, a breast cancer survivor on letrozole for two years, presents with  ongoing diarrhea and breast discomfort. She believes the diarrhea is a side effect of the letrozole, and she has been managing it by skipping meals on work days and occasionally skipping a dose of the medication. Despite these measures, she still experiences episodes of diarrhea. She also reports occasional hot flashes, primarily at night, but these do not significantly bother her.  In addition to the diarrhea, she has been experiencing discomfort in her right breast and limited range of motion in her right arm. The discomfort varies in intensity and is sometimes severe enough to prevent her from lifting her arm. She has declined further physical therapy at this time.         ALLERGIES:  is allergic to penicillins.  MEDICATIONS:  Current Outpatient Medications  Medication Sig Dispense Refill   benzonatate (TESSALON) 200 MG capsule Take 1 capsule (200 mg total) by mouth 3 (three) times daily as needed. 30 capsule 1   colchicine 0.6 MG tablet Take 1 tablet (0.6 mg total) by mouth daily as needed for gout. 30 tablet 0   Continuous Glucose Sensor (FREESTYLE LIBRE 2 SENSOR) MISC Change every 14 days to monitor blood glucose as directed 6 each 3   diphenoxylate-atropine (LOMOTIL) 2.5-0.025 MG tablet Take 1 tablet by mouth 4 (four) times daily as needed for diarrhea or loose stools 30 tablet 3   empagliflozin (JARDIANCE) 10 MG TABS tablet Take 1 tablet (10 mg total) by mouth daily. 90 tablet 3   famotidine (PEPCID) 20 MG tablet Take 1 tablet (20 mg total) by mouth 2 (two) times daily. (Patient taking differently: Take 20 mg by  mouth as needed for heartburn.) 60 tablet 0   FREESTYLE LITE test strip      gabapentin (NEURONTIN) 300 MG capsule Take 300 mg by mouth 3 (three) times daily.     insulin glargine-yfgn (SEMGLEE, YFGN,) 100 UNIT/ML Pen Inject 40 Units into the skin in the morning AND 34 Units at bedtime. 45 mL 3   insulin lispro (HUMALOG KWIKPEN) 100 UNIT/ML KwikPen Inject 14-16 Units into the  skin 2 (two) times daily as directed 45 mL 3   Insulin Pen Needle 31G X 8 MM MISC Use 1 daily  with Saxenda 100 each 3   Insulin Pen Needle 32G X 4 MM MISC Use with Levemir and Humalog 4 (four) times daily. 400 each 3   letrozole (FEMARA) 2.5 MG tablet Take 1 tablet (2.5 mg total) by mouth daily. 90 tablet 3   Pancrelipase, Lip-Prot-Amyl, (ZENPEP) 40000-126000 units CPEP Take 1 capsule by mouth in the morning and at bedtime. 120 capsule    ramipril (ALTACE) 2.5 MG capsule Take 1 capsule (2.5 mg total) by mouth every other day. 45 capsule 3   rosuvastatin (CRESTOR) 40 MG tablet Take 1 tablet (40 mg total) by mouth daily. 90 tablet 2   sitaGLIPtin (JANUVIA) 100 MG tablet Take 1 tablet (100 mg total) by mouth daily. 90 tablet 3   UNIFINE PENTIPS 32G X 4 MM MISC USE WITH LEVEMIR AND HUMALOG 4X A DAY DX-E11.65     valACYclovir (VALTREX) 500 MG tablet Take 1 tablet (500 mg total) by mouth 2 (two) times daily for 3 days for outbreak. 30 tablet 2   No current facility-administered medications for this visit.    PHYSICAL EXAMINATION: ECOG PERFORMANCE STATUS: 1 - Symptomatic but completely ambulatory  Vitals:   04/25/23 0930  BP: (!) 152/61  Pulse: 65  Resp: 18  Temp: 97.8 F (36.6 C)  SpO2: 99%   Filed Weights   04/25/23 0930  Weight: 173 lb 9.6 oz (78.7 kg)    Physical Exam   BREAST: Tenderness and limited range of motion in the right arm. SKIN: Mild lymphedema present.      (exam performed in the presence of a chaperone)  LABORATORY DATA:  I have reviewed the data as listed    Latest Ref Rng & Units 01/29/2023    1:24 PM 07/10/2021    9:34 AM 05/29/2021    8:29 AM  CMP  Glucose 70 - 99 mg/dL 875  643  95   BUN 8 - 23 mg/dL 6  9  7    Creatinine 0.44 - 1.00 mg/dL 3.29  5.18  8.41   Sodium 135 - 145 mmol/L 141  143  145   Potassium 3.5 - 5.1 mmol/L 3.8  3.9  3.8   Chloride 98 - 111 mmol/L 106  107  110   CO2 22 - 32 mmol/L 25  30  26    Calcium 8.9 - 10.3 mg/dL 9.6  66.0  9.5    Total Protein 6.5 - 8.1 g/dL  7.6  7.4   Total Bilirubin 0.3 - 1.2 mg/dL  1.1  1.2   Alkaline Phos 38 - 126 U/L  92  92   AST 15 - 41 U/L  12  17   ALT 0 - 44 U/L  11  17     Lab Results  Component Value Date   WBC 6.4 01/29/2023   HGB 13.8 01/29/2023   HCT 40.5 01/29/2023   MCV 82.3 01/29/2023   PLT 238  01/29/2023   NEUTROABS 4.5 07/10/2021    ASSESSMENT & PLAN:  Malignant neoplasm of upper-outer quadrant of right breast in female, estrogen receptor positive (HCC) 08/08/2020:Screening detected right breast mass 2.2 cm by ultrasound upper outer quadrant biopsy: Grade 2-3 IDC with DCIS ER 60%, PR 20%, Ki-67 15%, HER-2 +3+ T2N0 stage Ia Treatment plan: 1.  Neoadjuvant chemotherapy with Taxol and Herceptin weekly x12 followed by Herceptin maintenance completed February 2023 2. breast conserving surgery 09/17/2021:Right breast lumpectomy ([facility]): IDC 2.1 cm 6 SLN negative for cancer 3.  Adjuvant radiation completed 02/20/2021 4.  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     Breast cancer surveillance: 1.  Breast exam 04/25/2023: Benign 2. mammogram and bone density  09/27/2022: T-score 0.2: Normal, mammogram benign breast density category B   Return to clinic in 1 year for follow-up ------------------------------------- Assessment and Plan    Breast Cancer Two and a half years post-diagnosis, on Letrozole for two years. Reports diarrhea and hot flashes, but overall tolerating the medication. -Continue Letrozole. -Refill Letrozole for one year. -Provide prescription for Lomotil for diarrhea management.  Right Arm Mobility Reports occasional soreness and difficulty lifting right arm. Declined referral to physical therapy. -Continue current  management.  Lymphedema Mild lymphedema noted in the right breast. -Advise supportive bras and consider wearing at night for additional support.  Follow-up Tolerating treatment well. -Schedule annual follow-up appointment.        No orders of the defined types were placed in this encounter.  The patient has a good understanding of the overall plan. she agrees with it. she will call with any problems that may develop before the next visit here. Total time spent: 30 mins including face to face time and time spent for planning, charting and co-ordination of care   Tamsen Meek, MD 04/25/23

## 2023-04-29 ENCOUNTER — Other Ambulatory Visit (HOSPITAL_COMMUNITY): Payer: Self-pay

## 2023-04-29 MED ORDER — RAMIPRIL 2.5 MG PO CAPS
2.5000 mg | ORAL_CAPSULE | ORAL | 3 refills | Status: DC
  Filled 2023-04-29 – 2023-11-10 (×2): qty 45, 90d supply, fill #0

## 2023-04-29 MED ORDER — RAMIPRIL 2.5 MG PO CAPS
2.5000 mg | ORAL_CAPSULE | ORAL | 3 refills | Status: DC
  Filled 2023-04-29: qty 45, 90d supply, fill #0
  Filled 2023-07-21: qty 45, 90d supply, fill #1
  Filled 2024-03-08: qty 45, 90d supply, fill #2

## 2023-05-04 ENCOUNTER — Other Ambulatory Visit: Payer: Self-pay

## 2023-05-04 ENCOUNTER — Other Ambulatory Visit (HOSPITAL_COMMUNITY): Payer: Self-pay

## 2023-05-04 MED ORDER — ZENPEP 40000-126000 UNITS PO CPEP
2.0000 | ORAL_CAPSULE | Freq: Every day | ORAL | 2 refills | Status: AC
  Filled 2023-05-04: qty 240, 30d supply, fill #0

## 2023-05-04 MED ORDER — ZENPEP 40000-126000 UNITS PO CPEP
2.0000 | ORAL_CAPSULE | Freq: Two times a day (BID) | ORAL | 2 refills | Status: AC
  Filled 2023-05-04: qty 200, 25d supply, fill #0

## 2023-05-09 ENCOUNTER — Other Ambulatory Visit (HOSPITAL_COMMUNITY): Payer: Self-pay

## 2023-05-12 ENCOUNTER — Encounter: Payer: Self-pay | Admitting: Hematology and Oncology

## 2023-05-12 ENCOUNTER — Other Ambulatory Visit (HOSPITAL_COMMUNITY): Payer: Self-pay

## 2023-05-12 ENCOUNTER — Other Ambulatory Visit: Payer: Self-pay

## 2023-05-12 MED ORDER — FREESTYLE LIBRE 2 SENSOR MISC
1.0000 | 3 refills | Status: AC
  Filled 2023-05-12: qty 6, 84d supply, fill #0
  Filled 2023-09-12: qty 6, 84d supply, fill #1
  Filled 2023-12-14: qty 6, 84d supply, fill #2

## 2023-05-16 ENCOUNTER — Other Ambulatory Visit (HOSPITAL_COMMUNITY): Payer: Self-pay

## 2023-05-16 MED ORDER — TECHLITE PEN NEEDLES 31G X 8 MM MISC
0 refills | Status: AC
  Filled 2023-05-16: qty 100, 50d supply, fill #0

## 2023-05-20 DIAGNOSIS — D649 Anemia, unspecified: Secondary | ICD-10-CM | POA: Diagnosis not present

## 2023-05-23 DIAGNOSIS — F418 Other specified anxiety disorders: Secondary | ICD-10-CM | POA: Diagnosis not present

## 2023-05-23 DIAGNOSIS — E559 Vitamin D deficiency, unspecified: Secondary | ICD-10-CM | POA: Diagnosis not present

## 2023-05-23 DIAGNOSIS — Z79899 Other long term (current) drug therapy: Secondary | ICD-10-CM | POA: Diagnosis not present

## 2023-05-23 DIAGNOSIS — D649 Anemia, unspecified: Secondary | ICD-10-CM | POA: Diagnosis not present

## 2023-05-23 DIAGNOSIS — E785 Hyperlipidemia, unspecified: Secondary | ICD-10-CM | POA: Diagnosis not present

## 2023-05-23 DIAGNOSIS — E1165 Type 2 diabetes mellitus with hyperglycemia: Secondary | ICD-10-CM | POA: Diagnosis not present

## 2023-05-23 DIAGNOSIS — M109 Gout, unspecified: Secondary | ICD-10-CM | POA: Diagnosis not present

## 2023-05-23 DIAGNOSIS — R5383 Other fatigue: Secondary | ICD-10-CM | POA: Diagnosis not present

## 2023-05-23 DIAGNOSIS — R7989 Other specified abnormal findings of blood chemistry: Secondary | ICD-10-CM | POA: Diagnosis not present

## 2023-05-30 ENCOUNTER — Other Ambulatory Visit (HOSPITAL_COMMUNITY): Payer: Self-pay

## 2023-06-03 ENCOUNTER — Other Ambulatory Visit (HOSPITAL_COMMUNITY): Payer: Self-pay

## 2023-06-03 DIAGNOSIS — M1990 Unspecified osteoarthritis, unspecified site: Secondary | ICD-10-CM | POA: Diagnosis not present

## 2023-06-03 DIAGNOSIS — R82998 Other abnormal findings in urine: Secondary | ICD-10-CM | POA: Diagnosis not present

## 2023-06-03 DIAGNOSIS — F418 Other specified anxiety disorders: Secondary | ICD-10-CM | POA: Diagnosis not present

## 2023-06-03 DIAGNOSIS — R030 Elevated blood-pressure reading, without diagnosis of hypertension: Secondary | ICD-10-CM | POA: Diagnosis not present

## 2023-06-03 DIAGNOSIS — Z794 Long term (current) use of insulin: Secondary | ICD-10-CM | POA: Diagnosis not present

## 2023-06-03 DIAGNOSIS — M549 Dorsalgia, unspecified: Secondary | ICD-10-CM | POA: Diagnosis not present

## 2023-06-03 DIAGNOSIS — E1165 Type 2 diabetes mellitus with hyperglycemia: Secondary | ICD-10-CM | POA: Diagnosis not present

## 2023-06-03 DIAGNOSIS — Z0000 Encounter for general adult medical examination without abnormal findings: Secondary | ICD-10-CM | POA: Diagnosis not present

## 2023-06-03 DIAGNOSIS — M109 Gout, unspecified: Secondary | ICD-10-CM | POA: Diagnosis not present

## 2023-06-03 DIAGNOSIS — G629 Polyneuropathy, unspecified: Secondary | ICD-10-CM | POA: Diagnosis not present

## 2023-06-03 DIAGNOSIS — Z1331 Encounter for screening for depression: Secondary | ICD-10-CM | POA: Diagnosis not present

## 2023-06-03 DIAGNOSIS — Z23 Encounter for immunization: Secondary | ICD-10-CM | POA: Diagnosis not present

## 2023-06-03 DIAGNOSIS — E785 Hyperlipidemia, unspecified: Secondary | ICD-10-CM | POA: Diagnosis not present

## 2023-06-06 ENCOUNTER — Telehealth: Payer: Self-pay | Admitting: *Deleted

## 2023-06-06 ENCOUNTER — Other Ambulatory Visit (HOSPITAL_COMMUNITY): Payer: Self-pay

## 2023-06-06 NOTE — Telephone Encounter (Signed)
Patient left message requesting call back, no info provided.

## 2023-06-07 ENCOUNTER — Other Ambulatory Visit (HOSPITAL_COMMUNITY): Payer: Self-pay

## 2023-06-07 MED ORDER — JANUVIA 100 MG PO TABS
100.0000 mg | ORAL_TABLET | Freq: Every day | ORAL | 3 refills | Status: DC
  Filled 2023-06-07: qty 30, 30d supply, fill #0
  Filled 2023-07-21: qty 30, 30d supply, fill #1
  Filled 2023-09-12: qty 30, 30d supply, fill #2
  Filled 2023-10-21 – 2023-11-10 (×2): qty 30, 30d supply, fill #3
  Filled 2023-12-14: qty 30, 30d supply, fill #4
  Filled 2024-01-25: qty 30, 30d supply, fill #5
  Filled 2024-03-08: qty 30, 30d supply, fill #6
  Filled 2024-04-16: qty 30, 30d supply, fill #7
  Filled 2024-05-29: qty 30, 30d supply, fill #8

## 2023-06-07 NOTE — Telephone Encounter (Signed)
Call returned to patient, mailbox full, unable to leave message.    

## 2023-06-13 ENCOUNTER — Other Ambulatory Visit (HOSPITAL_COMMUNITY): Payer: Self-pay

## 2023-06-13 ENCOUNTER — Other Ambulatory Visit: Payer: Self-pay | Admitting: Nurse Practitioner

## 2023-06-13 DIAGNOSIS — N760 Acute vaginitis: Secondary | ICD-10-CM

## 2023-06-13 MED ORDER — FLUCONAZOLE 150 MG PO TABS
150.0000 mg | ORAL_TABLET | ORAL | 0 refills | Status: DC
Start: 2023-06-13 — End: 2023-11-22
  Filled 2023-06-13: qty 2, 6d supply, fill #0

## 2023-06-13 NOTE — Telephone Encounter (Signed)
Spoke with patient. Patient states RX for yeast was supposed to be sent in at last OV.   Reviewed chart, last visit was [facility] 01/20/23. No Rx sent.   Patient reports external vaginal itching for 2 weeks, off and on. Denies any other symptoms. Has tried OTC cream externally, no change. Patient declines OV at this time, asking if TW could send Rx?   Tiffany -please review and advise.

## 2023-06-13 NOTE — Telephone Encounter (Signed)
Patient notified. Encounter closed

## 2023-06-13 NOTE — Telephone Encounter (Signed)
Diflucan sent. Will need OV if symptoms persist or return. Thanks.

## 2023-06-14 ENCOUNTER — Other Ambulatory Visit (HOSPITAL_COMMUNITY): Payer: Self-pay

## 2023-07-21 ENCOUNTER — Other Ambulatory Visit: Payer: Self-pay

## 2023-07-21 ENCOUNTER — Encounter: Payer: Self-pay | Admitting: Hematology and Oncology

## 2023-07-21 ENCOUNTER — Other Ambulatory Visit (HOSPITAL_COMMUNITY): Payer: Self-pay

## 2023-07-21 MED ORDER — GABAPENTIN 300 MG PO CAPS
300.0000 mg | ORAL_CAPSULE | Freq: Three times a day (TID) | ORAL | 3 refills | Status: DC
  Filled 2023-07-21: qty 90, 30d supply, fill #0
  Filled 2023-11-10: qty 90, 30d supply, fill #1
  Filled 2024-03-08: qty 90, 30d supply, fill #2

## 2023-07-21 MED ORDER — INSULIN GLARGINE-YFGN 100 UNIT/ML ~~LOC~~ SOPN
PEN_INJECTOR | SUBCUTANEOUS | 3 refills | Status: DC
  Filled 2023-07-21 – 2023-09-29 (×2): qty 45, 61d supply, fill #0
  Filled 2023-12-14: qty 45, 61d supply, fill #1

## 2023-08-01 ENCOUNTER — Other Ambulatory Visit (HOSPITAL_COMMUNITY): Payer: Self-pay

## 2023-08-01 ENCOUNTER — Other Ambulatory Visit: Payer: Self-pay

## 2023-08-01 MED ORDER — INSULIN GLARGINE-YFGN 100 UNIT/ML ~~LOC~~ SOPN
PEN_INJECTOR | SUBCUTANEOUS | 3 refills | Status: DC
  Filled 2023-08-01: qty 45, 60d supply, fill #0
  Filled 2024-02-13: qty 45, 60d supply, fill #1

## 2023-08-02 ENCOUNTER — Other Ambulatory Visit (HOSPITAL_COMMUNITY): Payer: Self-pay

## 2023-08-02 MED ORDER — INSULIN GLARGINE-YFGN 100 UNIT/ML ~~LOC~~ SOPN
PEN_INJECTOR | SUBCUTANEOUS | 3 refills | Status: DC
  Filled 2023-08-02: qty 45, 61d supply, fill #0

## 2023-08-05 ENCOUNTER — Other Ambulatory Visit (HOSPITAL_COMMUNITY): Payer: Self-pay

## 2023-09-12 ENCOUNTER — Other Ambulatory Visit: Payer: Self-pay

## 2023-09-12 ENCOUNTER — Other Ambulatory Visit (HOSPITAL_COMMUNITY): Payer: Self-pay

## 2023-09-19 ENCOUNTER — Other Ambulatory Visit (HOSPITAL_COMMUNITY): Payer: Self-pay

## 2023-09-23 DIAGNOSIS — E1165 Type 2 diabetes mellitus with hyperglycemia: Secondary | ICD-10-CM | POA: Diagnosis not present

## 2023-09-23 DIAGNOSIS — G629 Polyneuropathy, unspecified: Secondary | ICD-10-CM | POA: Diagnosis not present

## 2023-09-23 DIAGNOSIS — F418 Other specified anxiety disorders: Secondary | ICD-10-CM | POA: Diagnosis not present

## 2023-09-23 DIAGNOSIS — E11319 Type 2 diabetes mellitus with unspecified diabetic retinopathy without macular edema: Secondary | ICD-10-CM | POA: Diagnosis not present

## 2023-09-23 DIAGNOSIS — N393 Stress incontinence (female) (male): Secondary | ICD-10-CM | POA: Diagnosis not present

## 2023-09-23 DIAGNOSIS — E559 Vitamin D deficiency, unspecified: Secondary | ICD-10-CM | POA: Diagnosis not present

## 2023-09-23 DIAGNOSIS — Z794 Long term (current) use of insulin: Secondary | ICD-10-CM | POA: Diagnosis not present

## 2023-09-23 DIAGNOSIS — R030 Elevated blood-pressure reading, without diagnosis of hypertension: Secondary | ICD-10-CM | POA: Diagnosis not present

## 2023-09-23 DIAGNOSIS — E785 Hyperlipidemia, unspecified: Secondary | ICD-10-CM | POA: Diagnosis not present

## 2023-09-23 DIAGNOSIS — K30 Functional dyspepsia: Secondary | ICD-10-CM | POA: Diagnosis not present

## 2023-09-29 ENCOUNTER — Other Ambulatory Visit (HOSPITAL_COMMUNITY): Payer: Self-pay

## 2023-09-30 ENCOUNTER — Other Ambulatory Visit (HOSPITAL_COMMUNITY): Payer: Self-pay

## 2023-10-11 ENCOUNTER — Other Ambulatory Visit (HOSPITAL_COMMUNITY): Payer: Self-pay

## 2023-10-11 ENCOUNTER — Encounter: Payer: Self-pay | Admitting: Pharmacist

## 2023-10-11 ENCOUNTER — Other Ambulatory Visit: Payer: Self-pay

## 2023-10-11 DIAGNOSIS — M25552 Pain in left hip: Secondary | ICD-10-CM | POA: Diagnosis not present

## 2023-10-11 DIAGNOSIS — M5416 Radiculopathy, lumbar region: Secondary | ICD-10-CM | POA: Diagnosis not present

## 2023-10-11 DIAGNOSIS — E669 Obesity, unspecified: Secondary | ICD-10-CM | POA: Diagnosis not present

## 2023-10-11 DIAGNOSIS — K8681 Exocrine pancreatic insufficiency: Secondary | ICD-10-CM | POA: Diagnosis not present

## 2023-10-11 DIAGNOSIS — K5730 Diverticulosis of large intestine without perforation or abscess without bleeding: Secondary | ICD-10-CM | POA: Diagnosis not present

## 2023-10-11 DIAGNOSIS — K7581 Nonalcoholic steatohepatitis (NASH): Secondary | ICD-10-CM | POA: Diagnosis not present

## 2023-10-11 DIAGNOSIS — R152 Fecal urgency: Secondary | ICD-10-CM | POA: Diagnosis not present

## 2023-10-11 MED ORDER — CREON 36000-114000 UNITS PO CPEP
ORAL_CAPSULE | ORAL | 4 refills | Status: AC
  Filled 2023-10-11 – 2023-11-10 (×3): qty 800, 89d supply, fill #0

## 2023-10-12 ENCOUNTER — Other Ambulatory Visit (HOSPITAL_COMMUNITY): Payer: Self-pay

## 2023-10-12 MED ORDER — METHYLPREDNISOLONE 4 MG PO TBPK
ORAL_TABLET | ORAL | 0 refills | Status: AC
  Filled 2023-10-12: qty 21, 6d supply, fill #0

## 2023-10-13 ENCOUNTER — Other Ambulatory Visit (HOSPITAL_COMMUNITY): Payer: Self-pay

## 2023-10-21 ENCOUNTER — Other Ambulatory Visit (HOSPITAL_COMMUNITY): Payer: Self-pay

## 2023-10-28 ENCOUNTER — Telehealth: Payer: Self-pay

## 2023-10-28 NOTE — Telephone Encounter (Signed)
 Patient called & left a message on refill voicemail requesting refill of fluconazole  150mg .  I called patient to see what symptoms she is complaining of. No answer from patient & her voicemail is full. I will send patient a mychart message to see if I can reach her that way.

## 2023-10-31 ENCOUNTER — Other Ambulatory Visit (HOSPITAL_COMMUNITY): Payer: Self-pay

## 2023-10-31 MED ORDER — ROPINIROLE HCL 0.25 MG PO TABS
0.2500 mg | ORAL_TABLET | Freq: Every evening | ORAL | 0 refills | Status: DC | PRN
Start: 2023-10-31 — End: 2024-02-13
  Filled 2023-10-31: qty 30, 30d supply, fill #0

## 2023-11-01 ENCOUNTER — Other Ambulatory Visit (HOSPITAL_COMMUNITY): Payer: Self-pay

## 2023-11-02 ENCOUNTER — Other Ambulatory Visit (HOSPITAL_COMMUNITY): Payer: Self-pay

## 2023-11-03 ENCOUNTER — Other Ambulatory Visit: Payer: Self-pay | Admitting: Internal Medicine

## 2023-11-03 DIAGNOSIS — Z9889 Other specified postprocedural states: Secondary | ICD-10-CM

## 2023-11-04 NOTE — Telephone Encounter (Signed)
No call back from patient.  Encounter closed.   

## 2023-11-10 ENCOUNTER — Encounter: Payer: Self-pay | Admitting: Hematology and Oncology

## 2023-11-10 ENCOUNTER — Other Ambulatory Visit (HOSPITAL_COMMUNITY): Payer: Self-pay

## 2023-11-15 ENCOUNTER — Encounter: Payer: Self-pay | Admitting: Hematology and Oncology

## 2023-11-17 ENCOUNTER — Encounter: Payer: Self-pay | Admitting: Hematology and Oncology

## 2023-11-17 ENCOUNTER — Other Ambulatory Visit (HOSPITAL_COMMUNITY): Payer: Self-pay | Admitting: Gastroenterology

## 2023-11-17 ENCOUNTER — Other Ambulatory Visit (HOSPITAL_COMMUNITY): Payer: Self-pay

## 2023-11-17 DIAGNOSIS — K5730 Diverticulosis of large intestine without perforation or abscess without bleeding: Secondary | ICD-10-CM | POA: Diagnosis not present

## 2023-11-17 DIAGNOSIS — K8681 Exocrine pancreatic insufficiency: Secondary | ICD-10-CM | POA: Diagnosis not present

## 2023-11-17 DIAGNOSIS — R1011 Right upper quadrant pain: Secondary | ICD-10-CM | POA: Diagnosis not present

## 2023-11-17 DIAGNOSIS — R110 Nausea: Secondary | ICD-10-CM | POA: Diagnosis not present

## 2023-11-17 DIAGNOSIS — K760 Fatty (change of) liver, not elsewhere classified: Secondary | ICD-10-CM | POA: Diagnosis not present

## 2023-11-22 ENCOUNTER — Encounter: Payer: Self-pay | Admitting: Nurse Practitioner

## 2023-11-22 ENCOUNTER — Other Ambulatory Visit (HOSPITAL_COMMUNITY): Payer: Self-pay

## 2023-11-22 ENCOUNTER — Ambulatory Visit (INDEPENDENT_AMBULATORY_CARE_PROVIDER_SITE_OTHER): Admitting: Nurse Practitioner

## 2023-11-22 VITALS — BP 112/64 | HR 76

## 2023-11-22 DIAGNOSIS — N3001 Acute cystitis with hematuria: Secondary | ICD-10-CM

## 2023-11-22 DIAGNOSIS — R300 Dysuria: Secondary | ICD-10-CM | POA: Diagnosis not present

## 2023-11-22 DIAGNOSIS — N760 Acute vaginitis: Secondary | ICD-10-CM

## 2023-11-22 LAB — WET PREP FOR TRICH, YEAST, CLUE

## 2023-11-22 MED ORDER — NITROFURANTOIN MONOHYD MACRO 100 MG PO CAPS
100.0000 mg | ORAL_CAPSULE | Freq: Two times a day (BID) | ORAL | 0 refills | Status: DC
  Filled 2023-11-22: qty 14, 7d supply, fill #0

## 2023-11-22 MED ORDER — FLUCONAZOLE 150 MG PO TABS
150.0000 mg | ORAL_TABLET | ORAL | 0 refills | Status: DC
  Filled 2023-11-22: qty 2, 6d supply, fill #0

## 2023-11-22 NOTE — Progress Notes (Signed)
   Acute Office Visit  Subjective:    Patient ID: Rachel Gaines, female    DOB: 05/19/1956, 68 y.o.   MRN: [id number]   HPI 68 y.o. presents today for urinary frequency, urgency, burning with urination x 1 week. Had blood in urine last week when she did UA at her work. Has noticed some discharge and mild itching as well.   Patient's last menstrual period was 06/28/2006 (approximate).    Review of Systems  Constitutional: Negative.  Negative for chills and fever.  Genitourinary:  Positive for dysuria, frequency, urgency, vaginal discharge and vaginal pain (Itching). Negative for difficulty urinating, flank pain, hematuria and pelvic pain.       Objective:     Physical Exam Constitutional:      Appearance: Normal appearance.  Abdominal:     Tenderness: There is no right CVA tenderness or left CVA tenderness.  Genitourinary:    General: Normal vulva.     Vagina: Normal.     BP 112/64 (BP Location: Right Arm, Patient Position: Sitting)   Pulse 76   LMP 06/28/2006 (Approximate)   SpO2 98%  Wt Readings from Last 3 Encounters:  04/25/23 173 lb 9.6 oz (78.7 kg)  04/19/23 174 lb (78.9 kg)  01/29/23 158 lb (71.7 kg)        Patient informed chaperone available to be present for breast and/or pelvic exam. Patient has requested no chaperone to be present. Patient has been advised what will be completed during breast and pelvic exam.   Wet prep negative for pathogens  UA: 1+ leukocytes, neg nitrites, 2+ blood, 2+ glucose, light yellow/cloudy. Microscopic: wbc 40-60, rbc 10-20, moderate bacteria  Assessment & Plan:   Problem List Items Addressed This Visit   None Visit Diagnoses       Acute cystitis with hematuria    -  Primary   Relevant Medications   nitrofurantoin, macrocrystal-monohydrate, (MACROBID) 100 MG capsule     Dysuria       Relevant Orders   Urinalysis,Complete w/RFL Culture     Acute vaginitis       Relevant Medications   fluconazole  (DIFLUCAN ) 150 MG  tablet   Other Relevant Orders   WET PREP FOR TRICH, YEAST, CLUE      Plan: Acute cystitis - Macrobid 100 mg BID x 7 days. Culture pending. Increase water intake. Requests Diflucan .   Return if symptoms worsen or fail to improve.    Andee Bamberger DNP, 9:00 AM 11/22/2023

## 2023-11-24 ENCOUNTER — Ambulatory Visit: Payer: Self-pay | Admitting: Nurse Practitioner

## 2023-11-24 ENCOUNTER — Other Ambulatory Visit (HOSPITAL_COMMUNITY): Payer: Self-pay

## 2023-11-24 ENCOUNTER — Other Ambulatory Visit: Payer: Self-pay | Admitting: Nurse Practitioner

## 2023-11-24 DIAGNOSIS — N3000 Acute cystitis without hematuria: Secondary | ICD-10-CM

## 2023-11-24 LAB — CULTURE INDICATED

## 2023-11-24 LAB — URINALYSIS, COMPLETE W/RFL CULTURE
Bilirubin Urine: NEGATIVE
Hyaline Cast: NONE SEEN /LPF
Ketones, ur: NEGATIVE
Nitrites, Initial: NEGATIVE
Specific Gravity, Urine: 1.022 (ref 1.001–1.035)
pH: 6 (ref 5.0–8.0)

## 2023-11-24 LAB — URINE CULTURE
MICRO NUMBER:: 16501844
SPECIMEN QUALITY:: ADEQUATE

## 2023-11-24 MED ORDER — SULFAMETHOXAZOLE-TRIMETHOPRIM 800-160 MG PO TABS
1.0000 | ORAL_TABLET | Freq: Two times a day (BID) | ORAL | 0 refills | Status: AC
  Filled 2023-11-24: qty 6, 3d supply, fill #0

## 2023-12-01 ENCOUNTER — Encounter

## 2023-12-01 ENCOUNTER — Encounter: Payer: Self-pay | Admitting: Hematology and Oncology

## 2023-12-02 ENCOUNTER — Encounter (HOSPITAL_COMMUNITY)
Admission: RE | Admit: 2023-12-02 | Discharge: 2023-12-02 | Disposition: A | Discharge status: Home / Self Care | Source: Ambulatory Visit | Attending: Gastroenterology | Admitting: Gastroenterology

## 2023-12-02 ENCOUNTER — Ambulatory Visit (HOSPITAL_COMMUNITY)
Admission: RE | Admit: 2023-12-02 | Discharge: 2023-12-02 | Disposition: A | Discharge status: Home / Self Care | Source: Ambulatory Visit | Attending: Gastroenterology | Admitting: Gastroenterology

## 2023-12-02 DIAGNOSIS — R1011 Right upper quadrant pain: Secondary | ICD-10-CM | POA: Insufficient documentation

## 2023-12-02 DIAGNOSIS — R932 Abnormal findings on diagnostic imaging of liver and biliary tract: Secondary | ICD-10-CM | POA: Diagnosis not present

## 2023-12-02 MED ORDER — TECHNETIUM TC 99M MEBROFENIN IV KIT
5.0000 | PACK | Freq: Once | INTRAVENOUS | Status: AC | PRN
  Administered 2023-12-02: 5 via INTRAVENOUS

## 2023-12-06 ENCOUNTER — Other Ambulatory Visit (HOSPITAL_COMMUNITY): Payer: Self-pay

## 2023-12-14 ENCOUNTER — Other Ambulatory Visit (HOSPITAL_COMMUNITY): Payer: Self-pay

## 2023-12-15 ENCOUNTER — Other Ambulatory Visit (HOSPITAL_COMMUNITY): Payer: Self-pay

## 2023-12-16 ENCOUNTER — Other Ambulatory Visit: Payer: Self-pay

## 2023-12-16 ENCOUNTER — Other Ambulatory Visit (HOSPITAL_COMMUNITY): Payer: Self-pay

## 2023-12-22 ENCOUNTER — Other Ambulatory Visit (HOSPITAL_COMMUNITY): Payer: Self-pay

## 2023-12-26 ENCOUNTER — Encounter: Payer: Self-pay | Admitting: Nurse Practitioner

## 2023-12-26 ENCOUNTER — Ambulatory Visit (INDEPENDENT_AMBULATORY_CARE_PROVIDER_SITE_OTHER): Admitting: Nurse Practitioner

## 2023-12-26 ENCOUNTER — Other Ambulatory Visit (HOSPITAL_COMMUNITY): Payer: Self-pay

## 2023-12-26 VITALS — BP 128/82 | HR 78 | Temp 98.1°F

## 2023-12-26 DIAGNOSIS — B3731 Acute candidiasis of vulva and vagina: Secondary | ICD-10-CM | POA: Diagnosis not present

## 2023-12-26 DIAGNOSIS — N3000 Acute cystitis without hematuria: Secondary | ICD-10-CM

## 2023-12-26 DIAGNOSIS — R350 Frequency of micturition: Secondary | ICD-10-CM

## 2023-12-26 MED ORDER — FLUCONAZOLE 150 MG PO TABS
150.0000 mg | ORAL_TABLET | ORAL | 0 refills | Status: AC
  Filled 2023-12-26: qty 2, 6d supply, fill #0

## 2023-12-26 MED ORDER — SULFAMETHOXAZOLE-TRIMETHOPRIM 800-160 MG PO TABS
1.0000 | ORAL_TABLET | Freq: Two times a day (BID) | ORAL | 0 refills | Status: AC
  Filled 2023-12-26: qty 6, 3d supply, fill #0

## 2023-12-26 NOTE — Progress Notes (Signed)
   Acute Office Visit  Subjective:    Patient ID: Rachel Gaines, female    DOB: 04/15/1956, 68 y.o.   MRN: [id number]   HPI 68 y.o. presents today for urinary frequency, urgency, tingling with urination, back pain and nausea at times x 1.5 weeks.  Treated for UTI 5/27 with macrobid . Symptoms resolved. Denies vaginal symptoms today.   Patient's last menstrual period was 06/28/2006 (approximate).    Review of Systems  Constitutional:  Negative for chills and fever.  Genitourinary:  Positive for dysuria, frequency, pelvic pain (Pressure) and urgency. Negative for difficulty urinating, hematuria, vaginal discharge and vaginal pain.       Objective:    Physical Exam Constitutional:      Appearance: Normal appearance.  Abdominal:     Tenderness: There is no right CVA tenderness or left CVA tenderness.     BP 128/82   Pulse 78   Temp 98.1 F (36.7 C) (Oral)   LMP 06/28/2006 (Approximate)   SpO2 99%  Wt Readings from Last 3 Encounters:  04/25/23 173 lb 9.6 oz (78.7 kg)  04/19/23 174 lb (78.9 kg)  01/29/23 158 lb (71.7 kg)        UA: trace leukocytes, neg nitrites, trace blood, 3+ glucose, yellow/cloudy. Microscopic: wbc 20-40, rbc 0-2, no bacteria, few yeast  with budding seen  Assessment & Plan:   Problem List Items Addressed This Visit   None Visit Diagnoses       Acute cystitis without hematuria    -  Primary   Relevant Medications   sulfamethoxazole -trimethoprim  (BACTRIM  DS) 800-160 MG tablet     Urinary frequency       Relevant Orders   Urinalysis,Complete w/RFL Culture     Vaginal candidiasis       Relevant Medications      fluconazole  (DIFLUCAN ) 150 MG tablet      Plan: Yeast present in UA, also has moderate amount of WBCs. Bactrim  800-160 mg BID x 3 days, culture pending. Diflucan  150 mg every 3 days x 2 doses.   Return if symptoms worsen or fail to improve.    Annabella DELENA Shutter DNP, 9:13 AM 12/26/2023

## 2023-12-28 ENCOUNTER — Ambulatory Visit: Payer: Self-pay | Admitting: Nurse Practitioner

## 2023-12-28 LAB — URINALYSIS, COMPLETE W/RFL CULTURE
Bacteria, UA: NONE SEEN /HPF
Bilirubin Urine: NEGATIVE
Hyaline Cast: NONE SEEN /LPF
Ketones, ur: NEGATIVE
Nitrites, Initial: NEGATIVE
Protein, ur: NEGATIVE
Specific Gravity, Urine: 1.024 (ref 1.001–1.035)
pH: 5.5 (ref 5.0–8.0)

## 2023-12-28 LAB — URINE CULTURE
MICRO NUMBER:: 16640420
SPECIMEN QUALITY:: ADEQUATE

## 2023-12-28 LAB — CULTURE INDICATED

## 2023-12-29 ENCOUNTER — Other Ambulatory Visit (HOSPITAL_COMMUNITY): Payer: Self-pay

## 2024-01-04 ENCOUNTER — Other Ambulatory Visit: Payer: Self-pay | Admitting: Internal Medicine

## 2024-01-04 DIAGNOSIS — Z9889 Other specified postprocedural states: Secondary | ICD-10-CM

## 2024-01-06 ENCOUNTER — Ambulatory Visit
Admission: RE | Admit: 2024-01-06 | Discharge: 2024-01-06 | Disposition: A | Discharge status: Home / Self Care | Source: Ambulatory Visit | Attending: Internal Medicine | Admitting: Internal Medicine

## 2024-01-06 ENCOUNTER — Other Ambulatory Visit: Payer: Self-pay | Admitting: Internal Medicine

## 2024-01-06 DIAGNOSIS — Z9889 Other specified postprocedural states: Secondary | ICD-10-CM

## 2024-01-06 DIAGNOSIS — N6312 Unspecified lump in the right breast, upper inner quadrant: Secondary | ICD-10-CM | POA: Diagnosis not present

## 2024-01-06 DIAGNOSIS — R928 Other abnormal and inconclusive findings on diagnostic imaging of breast: Secondary | ICD-10-CM

## 2024-01-06 DIAGNOSIS — N6311 Unspecified lump in the right breast, upper outer quadrant: Secondary | ICD-10-CM | POA: Diagnosis not present

## 2024-01-09 ENCOUNTER — Other Ambulatory Visit: Payer: Self-pay | Admitting: Internal Medicine

## 2024-01-09 DIAGNOSIS — N6310 Unspecified lump in the right breast, unspecified quadrant: Secondary | ICD-10-CM

## 2024-01-17 ENCOUNTER — Ambulatory Visit
Admission: RE | Admit: 2024-01-17 | Discharge: 2024-01-17 | Disposition: A | Discharge status: Home / Self Care | Source: Ambulatory Visit | Attending: Internal Medicine | Admitting: Internal Medicine

## 2024-01-17 DIAGNOSIS — N6310 Unspecified lump in the right breast, unspecified quadrant: Secondary | ICD-10-CM

## 2024-01-17 DIAGNOSIS — N6312 Unspecified lump in the right breast, upper inner quadrant: Secondary | ICD-10-CM | POA: Diagnosis not present

## 2024-01-17 DIAGNOSIS — N6311 Unspecified lump in the right breast, upper outer quadrant: Secondary | ICD-10-CM | POA: Diagnosis not present

## 2024-01-17 HISTORY — PX: BREAST BIOPSY: SHX20

## 2024-01-18 LAB — SURGICAL PATHOLOGY

## 2024-01-25 ENCOUNTER — Other Ambulatory Visit (HOSPITAL_COMMUNITY): Payer: Self-pay

## 2024-02-13 ENCOUNTER — Other Ambulatory Visit (HOSPITAL_COMMUNITY): Payer: Self-pay

## 2024-02-13 ENCOUNTER — Other Ambulatory Visit: Payer: Self-pay | Admitting: Hematology and Oncology

## 2024-02-13 DIAGNOSIS — R197 Diarrhea, unspecified: Secondary | ICD-10-CM

## 2024-02-13 MED ORDER — DIPHENOXYLATE-ATROPINE 2.5-0.025 MG PO TABS
1.0000 | ORAL_TABLET | Freq: Four times a day (QID) | ORAL | 3 refills | Status: AC | PRN
  Filled 2024-02-13: qty 30, 8d supply, fill #0

## 2024-02-13 MED ORDER — ROPINIROLE HCL 0.25 MG PO TABS
0.2500 mg | ORAL_TABLET | Freq: Every evening | ORAL | 1 refills | Status: AC | PRN
  Filled 2024-02-13: qty 30, 30d supply, fill #0
  Filled 2024-05-29 – 2024-06-08 (×2): qty 30, 30d supply, fill #1

## 2024-02-13 MED ORDER — ROSUVASTATIN CALCIUM 40 MG PO TABS
40.0000 mg | ORAL_TABLET | Freq: Every day | ORAL | 2 refills | Status: AC
  Filled 2024-02-13: qty 5, 5d supply, fill #0
  Filled 2024-02-13: qty 85, 85d supply, fill #0
  Filled 2024-06-29: qty 90, 90d supply, fill #1
  Filled 2024-06-29: qty 30, 30d supply, fill #1

## 2024-02-16 NOTE — Progress Notes (Unsigned)
 Rachel Gaines May 07, 1956 [id number]   History:  67 y.o. [id number] presents for annual exam. Postmenopausal - no HRT, no bleeding. Normal pap history. 07/2020 right breast cancer, triple positive managed with lumpectomy, chemo, radiation, and currently on Letrozole . History of DM, HLD. HSV, rare outbreaks.   Gynecologic History Patient's last menstrual period was 06/28/2006 (approximate).   Contraception/Family planning: post menopausal status Sexually active: No  Health Maintenance Last Pap: 01/06/2018. Results were: ASCUS neg HPV Last mammogram: 01/06/2024. Results were: 2 suspicious masses right breast, biopsy confirmed fat necrosis Last colonoscopy: 05/05/2018. Results were: Polyps, 5-year recall Last Dexa: 09/27/2022. Results were: Normal     09/19/2015   10:38 AM  Depression screen PHQ 2/9  Decreased Interest 0  Down, Depressed, Hopeless 1  PHQ - 2 Score 1     Past medical history, past surgical history, family history and social history were all reviewed and documented in the EPIC chart. Single. 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 Daughter in [place], estranged. Son is local, lives with her, unemployed, has 67 yo daughter and 85 yo son. She keeps them Monday-Thursday.   ROS:  A ROS was performed and pertinent positives and negatives are included.  Exam:  There were no vitals filed for this visit.   There is no height or weight on file to calculate BMI.  General appearance:  Normal Thyroid:  Symmetrical, normal in size, without palpable masses or nodularity. Respiratory  Auscultation:  Clear without wheezing or rhonchi Cardiovascular  Auscultation:  Regular rate, without rubs, murmurs or gallops  Edema/varicosities:  Not grossly evident Abdominal  Soft,nontender, without masses, guarding or rebound.  Liver/spleen:  No organomegaly noted  Hernia:  None appreciated  Skin  Inspection:  Grossly normal Breasts: Examined lying and sitting.   Right: Without masses, retractions, nipple  discharge or axillary adenopathy. Mild lymphedema present along lower inner breast, some tenderness during exam, no redness  Left: Without masses, retractions, nipple discharge or axillary adenopathy. Pelvic: External genitalia:  no lesions              Urethra:  normal appearing urethra with no masses, tenderness or lesions              Bartholins and Skenes: normal                 Vagina: normal appearing vagina with normal color and discharge, no lesions              Cervix: no lesions Bimanual Exam:  Uterus:  no masses or tenderness              Adnexa: no mass, fullness, tenderness              Rectovaginal: Deferred              Anus:  normal, no lesions   Assessment/Plan:  68 y.o. [id number] for annual exam.   Well female exam with routine gynecological exam - Education provided on SBEs, importance of preventative screenings, current guidelines, high calcium  diet, regular exercise, and multivitamin daily.  Labs with PCP.   Postmenopausal - no HRT, no bleeding  Malignant neoplasm of upper-outer quadrant of right breast in female, estrogen receptor positive (HCC) - 07/2020 right breast cancer, triple positive managed with lumpectomy, chemo, radiation, and currently on Letrozole . UTD on screenings. Benign biopsy last month right breast, 95-month recall.   HSV (herpes simplex virus) infection - Plan: valACYclovir  (VALTREX ) 500 MG tablet as needed for outbreaks.   Screening  for cervical cancer -12/2022 ASCUS neg HPV. Will repeat at 3-year interval per guidelines.   Screening for colon cancer - 2019 colonoscopy. Due this year and plans to schedule soon.   Screening for osteoporosis - Normal bone density 09/2022.  No follow-ups on file.     Rachel DELENA Shutter DNP, 6:59 PM 02/16/2024

## 2024-02-17 ENCOUNTER — Ambulatory Visit (INDEPENDENT_AMBULATORY_CARE_PROVIDER_SITE_OTHER): Admitting: Nurse Practitioner

## 2024-02-17 ENCOUNTER — Other Ambulatory Visit (HOSPITAL_COMMUNITY): Payer: Self-pay

## 2024-02-17 ENCOUNTER — Encounter: Payer: Self-pay | Admitting: Nurse Practitioner

## 2024-02-17 VITALS — BP 122/70 | HR 67 | Ht 61.0 in | Wt 165.0 lb

## 2024-02-17 DIAGNOSIS — C50411 Malignant neoplasm of upper-outer quadrant of right female breast: Secondary | ICD-10-CM | POA: Diagnosis not present

## 2024-02-17 DIAGNOSIS — B009 Herpesviral infection, unspecified: Secondary | ICD-10-CM | POA: Diagnosis not present

## 2024-02-17 DIAGNOSIS — Z1331 Encounter for screening for depression: Secondary | ICD-10-CM | POA: Diagnosis not present

## 2024-02-17 DIAGNOSIS — Z780 Asymptomatic menopausal state: Secondary | ICD-10-CM

## 2024-02-17 DIAGNOSIS — Z01419 Encounter for gynecological examination (general) (routine) without abnormal findings: Secondary | ICD-10-CM

## 2024-02-17 DIAGNOSIS — Z170 Estrogen receptor positive status [ER+]: Secondary | ICD-10-CM

## 2024-02-17 MED ORDER — VALACYCLOVIR HCL 500 MG PO TABS
500.0000 mg | ORAL_TABLET | Freq: Two times a day (BID) | ORAL | 1 refills | Status: AC
  Filled 2024-02-17: qty 30, 15d supply, fill #0
  Filled 2024-07-02: qty 30, 15d supply, fill #1

## 2024-02-21 ENCOUNTER — Other Ambulatory Visit (HOSPITAL_COMMUNITY): Payer: Self-pay

## 2024-02-23 ENCOUNTER — Ambulatory Visit: Admitting: Nurse Practitioner

## 2024-02-24 ENCOUNTER — Other Ambulatory Visit (HOSPITAL_COMMUNITY): Payer: Self-pay

## 2024-02-24 DIAGNOSIS — E559 Vitamin D deficiency, unspecified: Secondary | ICD-10-CM | POA: Diagnosis not present

## 2024-02-24 DIAGNOSIS — G629 Polyneuropathy, unspecified: Secondary | ICD-10-CM | POA: Diagnosis not present

## 2024-02-24 DIAGNOSIS — E11319 Type 2 diabetes mellitus with unspecified diabetic retinopathy without macular edema: Secondary | ICD-10-CM | POA: Diagnosis not present

## 2024-02-24 DIAGNOSIS — E1165 Type 2 diabetes mellitus with hyperglycemia: Secondary | ICD-10-CM | POA: Diagnosis not present

## 2024-02-24 DIAGNOSIS — F418 Other specified anxiety disorders: Secondary | ICD-10-CM | POA: Diagnosis not present

## 2024-02-24 DIAGNOSIS — R030 Elevated blood-pressure reading, without diagnosis of hypertension: Secondary | ICD-10-CM | POA: Diagnosis not present

## 2024-02-24 DIAGNOSIS — E669 Obesity, unspecified: Secondary | ICD-10-CM | POA: Diagnosis not present

## 2024-02-24 DIAGNOSIS — E785 Hyperlipidemia, unspecified: Secondary | ICD-10-CM | POA: Diagnosis not present

## 2024-02-24 DIAGNOSIS — Z794 Long term (current) use of insulin: Secondary | ICD-10-CM | POA: Diagnosis not present

## 2024-02-24 DIAGNOSIS — C50411 Malignant neoplasm of upper-outer quadrant of right female breast: Secondary | ICD-10-CM | POA: Diagnosis not present

## 2024-02-24 MED ORDER — FREESTYLE LIBRE 3 PLUS SENSOR MISC
1.0000 | 3 refills | Status: AC
  Filled 2024-02-24: qty 6, 90d supply, fill #0
  Filled 2024-03-08: qty 2, 30d supply, fill #0

## 2024-03-06 ENCOUNTER — Other Ambulatory Visit (HOSPITAL_COMMUNITY): Payer: Self-pay

## 2024-03-08 ENCOUNTER — Other Ambulatory Visit (HOSPITAL_COMMUNITY): Payer: Self-pay

## 2024-03-22 ENCOUNTER — Other Ambulatory Visit (HOSPITAL_COMMUNITY): Payer: Self-pay

## 2024-03-23 ENCOUNTER — Encounter (HOSPITAL_COMMUNITY): Payer: Self-pay

## 2024-03-23 ENCOUNTER — Other Ambulatory Visit (HOSPITAL_COMMUNITY): Payer: Self-pay

## 2024-03-23 MED ORDER — INSULIN LISPRO (1 UNIT DIAL) 100 UNIT/ML (KWIKPEN)
14.0000 [IU] | PEN_INJECTOR | Freq: Two times a day (BID) | SUBCUTANEOUS | 3 refills | Status: AC
  Filled 2024-03-23: qty 27, 84d supply, fill #0
  Filled 2024-07-20: qty 27, 84d supply, fill #1

## 2024-03-23 MED ORDER — IBUPROFEN 800 MG PO TABS
800.0000 mg | ORAL_TABLET | Freq: Three times a day (TID) | ORAL | 0 refills | Status: AC | PRN
  Filled 2024-03-23: qty 40, 13d supply, fill #0

## 2024-03-23 MED ORDER — CLINDAMYCIN HCL 300 MG PO CAPS
300.0000 mg | ORAL_CAPSULE | Freq: Four times a day (QID) | ORAL | 0 refills | Status: DC
  Filled 2024-03-23: qty 30, 8d supply, fill #0

## 2024-03-30 DIAGNOSIS — H52223 Regular astigmatism, bilateral: Secondary | ICD-10-CM | POA: Diagnosis not present

## 2024-03-30 DIAGNOSIS — E119 Type 2 diabetes mellitus without complications: Secondary | ICD-10-CM | POA: Diagnosis not present

## 2024-04-16 ENCOUNTER — Other Ambulatory Visit (HOSPITAL_COMMUNITY): Payer: Self-pay

## 2024-04-16 MED ORDER — JARDIANCE 10 MG PO TABS
10.0000 mg | ORAL_TABLET | Freq: Every day | ORAL | 3 refills | Status: AC
  Filled 2024-04-16: qty 90, 90d supply, fill #0
  Filled 2024-07-20: qty 90, 90d supply, fill #1

## 2024-04-19 ENCOUNTER — Other Ambulatory Visit (HOSPITAL_COMMUNITY): Payer: Self-pay

## 2024-04-23 ENCOUNTER — Other Ambulatory Visit (HOSPITAL_COMMUNITY): Payer: Self-pay

## 2024-04-24 ENCOUNTER — Inpatient Hospital Stay: Payer: 59 | Admitting: Hematology and Oncology

## 2024-04-24 NOTE — Assessment & Plan Note (Deleted)
 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  Neoadjuvant chemotherapy with Taxol  and Herceptin  weekly x12 followed by Herceptin  maintenance completed February 2023 2. breast conserving surgery 09/17/2021:Right breast lumpectomy ([facility]): IDC 2.1 cm 6 SLN negative for cancer 3.  Adjuvant radiation completed 02/20/2021 4.  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     Breast cancer surveillance: 1.  Breast exam 04/24/2024: Benign 2. mammogram 01/06/2024: Suspicious 7 mm mass right breast 10 o'clock position, 6 mm mass 2 o'clock position: Biopsies: Fat necrosis  Bone density: T-score 0.2: Normal, mammogram benign breast density category B   Return to clinic in 1 year for follow-up

## 2024-04-25 ENCOUNTER — Other Ambulatory Visit (HOSPITAL_COMMUNITY): Payer: Self-pay

## 2024-04-26 ENCOUNTER — Encounter: Payer: Self-pay | Admitting: Hematology and Oncology

## 2024-04-26 ENCOUNTER — Other Ambulatory Visit (HOSPITAL_COMMUNITY): Payer: Self-pay

## 2024-04-26 MED ORDER — TRESIBA FLEXTOUCH 100 UNIT/ML ~~LOC~~ SOPN
PEN_INJECTOR | SUBCUTANEOUS | 3 refills | Status: AC
  Filled 2024-04-26 (×4): qty 24, 32d supply, fill #0
  Filled 2024-04-27: qty 45, 61d supply, fill #0
  Filled 2024-06-08: qty 45, 61d supply, fill #1

## 2024-04-27 ENCOUNTER — Other Ambulatory Visit (HOSPITAL_COMMUNITY): Payer: Self-pay

## 2024-04-27 ENCOUNTER — Other Ambulatory Visit: Payer: Self-pay

## 2024-05-01 ENCOUNTER — Other Ambulatory Visit (HOSPITAL_COMMUNITY): Payer: Self-pay

## 2024-05-01 ENCOUNTER — Inpatient Hospital Stay: Attending: Hematology and Oncology | Admitting: Hematology and Oncology

## 2024-05-01 VITALS — BP 140/76 | HR 75 | Temp 97.9°F | Resp 18 | Ht 61.0 in | Wt 165.9 lb

## 2024-05-01 DIAGNOSIS — C50411 Malignant neoplasm of upper-outer quadrant of right female breast: Secondary | ICD-10-CM

## 2024-05-01 DIAGNOSIS — Z1721 Progesterone receptor positive status: Secondary | ICD-10-CM | POA: Diagnosis not present

## 2024-05-01 DIAGNOSIS — Z170 Estrogen receptor positive status [ER+]: Secondary | ICD-10-CM | POA: Diagnosis not present

## 2024-05-01 DIAGNOSIS — Z79899 Other long term (current) drug therapy: Secondary | ICD-10-CM | POA: Diagnosis not present

## 2024-05-01 DIAGNOSIS — Z79624 Long term (current) use of inhibitors of nucleotide synthesis: Secondary | ICD-10-CM | POA: Diagnosis not present

## 2024-05-01 DIAGNOSIS — Z9221 Personal history of antineoplastic chemotherapy: Secondary | ICD-10-CM | POA: Diagnosis not present

## 2024-05-01 DIAGNOSIS — Z923 Personal history of irradiation: Secondary | ICD-10-CM | POA: Diagnosis not present

## 2024-05-01 DIAGNOSIS — Z79811 Long term (current) use of aromatase inhibitors: Secondary | ICD-10-CM | POA: Diagnosis not present

## 2024-05-01 MED ORDER — LETROZOLE 2.5 MG PO TABS
2.5000 mg | ORAL_TABLET | Freq: Every day | ORAL | 3 refills | Status: AC
  Filled 2024-05-01 – 2024-07-02 (×2): qty 90, 90d supply, fill #0

## 2024-05-01 NOTE — Progress Notes (Signed)
 Patient Care Team: Onita Rush, MD as PCP - General (Internal Medicine) Lonni Slain, MD as PCP - Cardiology (Cardiology) Dewey Rush, MD as Consulting Physician (Radiation Oncology) Odean Potts, MD as Consulting Physician (Hematology and Oncology) Ebbie Cough, MD as Consulting Physician (General Surgery)  DIAGNOSIS:  Encounter Diagnosis  Name Primary?   Malignant neoplasm of upper-outer quadrant of right breast in female, estrogen receptor positive (HCC) Yes    SUMMARY OF ONCOLOGIC HISTORY: Oncology History  Malignant neoplasm of upper-outer quadrant of right breast in female, estrogen receptor positive (HCC)  08/08/2020 Initial Diagnosis   Screening detected right breast mass 2.2 cm by ultrasound upper outer quadrant biopsy: Grade 2-3 IDC with DCIS ER 60%, PR 20%, Ki-67 15%, HER-2 +3+   08/15/2020 Cancer Staging   Staging form: Breast, AJCC 8th Edition - Clinical stage from 08/15/2020: Stage IB (cT2, cN0, cM0, G3, ER+, PR+, HER2+) - Signed by Odean Potts, MD on 08/15/2020 Stage prefix: Initial diagnosis   09/12/2020 - 11/14/2020 Neo-Adjuvant Chemotherapy   Neoadjuvant Taxol /Herceptin  x 12 weeks   09/17/2020 Surgery   Right breast lumpectomy ([facility]): IDC 2.1 cm 6 SLN negative for cancer   11/21/2020 -  Adjuvant Chemotherapy   Maintenance Herceptin    01/26/2021 - 02/20/2021 Radiation Therapy   Site Technique Total Dose (Gy) Dose per Fx (Gy) Completed Fx Beam Energies  Breast, Right: Breast_Rt 3D 42.56/42.56 2.66 16/16 10X  Breast, Right: Breast_Rt_Bst 3D 8/8 2 4/4 10X     03/17/2021 -  Anti-estrogen oral therapy   Letrozole  x 7 years     CHIEF COMPLIANT: Follow-up on letrozole  therapy  HISTORY OF PRESENT ILLNESS:  History of Present Illness Rachel Gaines is a 68 year old female who presents for follow-up regarding her medication management.  She experiences intermittent soreness in areas that have undergone surgery and radiation. She is currently  taking letrozole  and requires a refill. She inquires about a supplement, PEA, as a potential alternative to gabapentin .     ALLERGIES:  is allergic to metformin hcl and penicillins.  MEDICATIONS:  Current Outpatient Medications  Medication Sig Dispense Refill   colchicine  0.6 MG tablet Take 1 tablet (0.6 mg total) by mouth daily as needed for gout. 30 tablet 0   Continuous Glucose Sensor (FREESTYLE LIBRE 2 SENSOR) MISC Change every 14 days to monitor blood glucose as directed 6 each 3   Continuous Glucose Sensor (FREESTYLE LIBRE 2 SENSOR) MISC Use to monitor blood glucose continuously as directed, changing the sensor every 14 (fourteen) days. 6 each 3   Continuous Glucose Sensor (FREESTYLE LIBRE 3 PLUS SENSOR) MISC Change sensor every 15 days. 6 each 3   diphenoxylate -atropine  (LOMOTIL ) 2.5-0.025 MG tablet Take 1 tablet by mouth 4 (four) times daily as needed for diarrhea or loose stools 30 tablet 3   empagliflozin  (JARDIANCE ) 10 MG TABS tablet Take 1 tablet (10 mg total) by mouth daily. 90 tablet 3   famotidine  (PEPCID ) 20 MG tablet Take 1 tablet (20 mg total) by mouth 2 (two) times daily. 60 tablet 0   fluconazole  (DIFLUCAN ) 150 MG tablet Take 1 tablet (150 mg total) by mouth every 3 (three) days. 2 tablet 0   FREESTYLE LITE test strip      gabapentin  (NEURONTIN ) 300 MG capsule Take 300 mg by mouth 3 (three) times daily.     ibuprofen  (ADVIL ) 800 MG tablet Take 1 tablet (800 mg total) by mouth every 8 (eight) hours as needed for pain. 40 tablet 0   insulin  degludec (TRESIBA  FLEXTOUCH) 100 UNIT/ML FlexTouch Pen Inject 40 Units into the skin every morning AND 34 Units at bedtime as directed. 45 mL 3   insulin  lispro (HUMALOG ) 100 UNIT/ML KwikPen Inject 14-16 Units into the skin 2 (two) times daily as directed 45 mL 3   Insulin  Pen Needle (TECHLITE PEN NEEDLES) 31G X 8 MM MISC Use 1 pen needle as directed. 100 each 0   Insulin  Pen Needle 31G X 8 MM MISC Use 1 daily  with Saxenda 100 each 3    Insulin  Pen Needle 32G X 4 MM MISC Use with Levemir and Humalog  4 (four) times daily. 400 each 3   letrozole  (FEMARA ) 2.5 MG tablet Take 1 tablet (2.5 mg total) by mouth daily. 90 tablet 3   lipase/protease/amylase (CREON ) 36000 UNITS CPEP capsule Take 2 capsules (72,000 Units total) by mouth 3 (three) times daily with meals AND 1 capsule (36,000 Units total) with snacks, as directed. 720 capsule 4   methylPREDNISolone  (MEDROL ) 4 MG TBPK tablet Take as prescribed on dose pack over 6 days 21 tablet 0   Pancrelipase , Lip-Prot-Amyl, (ZENPEP ) 40000-126000 units CPEP Take 1 capsule by mouth in the morning and at bedtime. 120 capsule    Pancrelipase , Lip-Prot-Amyl, (ZENPEP ) 40000-126000 units CPEP Take 2 capsules by mouth with  meals and 1 with snacks. 240 capsule 2   Pancrelipase , Lip-Prot-Amyl, (ZENPEP ) 40000-126000 units CPEP Take 2 capsules by mouth  with meals and 1 with snacks. 240 capsule 2   ramipril  (ALTACE ) 2.5 MG capsule Take 1 capsule (2.5 mg total) by mouth every other day 45 capsule 3   rOPINIRole  (REQUIP ) 0.25 MG tablet Take 1 tablet (0.25 mg total) by mouth at bedtime as needed. 30 tablet 1   rosuvastatin  (CRESTOR ) 40 MG tablet Take 1 tablet (40 mg total) by mouth daily. 90 tablet 2   sitaGLIPtin  (JANUVIA ) 100 MG tablet Take 1 tablet (100 mg total) by mouth daily. 90 tablet 3   UNIFINE PENTIPS 32G X 4 MM MISC USE WITH LEVEMIR AND HUMALOG  4X A DAY [id number]     valACYclovir  (VALTREX ) 500 MG tablet Take 1 tablet (500 mg total) by mouth 2 (two) times daily for 3-5 days at first sign of outbreak. 30 tablet 1   No current facility-administered medications for this visit.    PHYSICAL EXAMINATION: ECOG PERFORMANCE STATUS: 1 - Symptomatic but completely ambulatory  Vitals:   05/01/24 0908  BP: (!) 140/76  Pulse: 75  Resp: 18  Temp: 97.9 F (36.6 C)  SpO2: 97%   Filed Weights   05/01/24 0908  Weight: 165 lb 14.4 oz (75.3 kg)    Physical Exam   (exam performed in the presence of a  chaperone)  LABORATORY DATA:  I have reviewed the data as listed    Latest Ref Rng & Units 01/29/2023    1:24 PM 07/10/2021    9:34 AM 05/29/2021    8:29 AM  CMP  Glucose 70 - 99 mg/dL 810  788  95   BUN 8 - 23 mg/dL 6  9  7    Creatinine 0.44 - 1.00 mg/dL 9.29  9.25  9.28   Sodium 135 - 145 mmol/L 141  143  145   Potassium 3.5 - 5.1 mmol/L 3.8  3.9  3.8   Chloride 98 - 111 mmol/L 106  107  110   CO2 22 - 32 mmol/L 25  30  26    Calcium  8.9 - 10.3 mg/dL 9.6  89.9  9.5   Total  Protein 6.5 - 8.1 g/dL  7.6  7.4   Total Bilirubin 0.3 - 1.2 mg/dL  1.1  1.2   Alkaline Phos 38 - 126 U/L  92  92   AST 15 - 41 U/L  12  17   ALT 0 - 44 U/L  11  17     Lab Results  Component Value Date   WBC 6.4 01/29/2023   HGB 13.8 01/29/2023   HCT 40.5 01/29/2023   MCV 82.3 01/29/2023   PLT 238 01/29/2023   NEUTROABS 4.5 07/10/2021    ASSESSMENT & PLAN:  Malignant neoplasm of upper-outer quadrant of right breast in female, estrogen receptor positive (HCC) 08/08/2020:Screening detected right breast mass 2.2 cm by ultrasound upper outer quadrant biopsy: Grade 2-3 IDC with DCIS ER 60%, PR 20%, Ki-67 15%, HER-2 +3+ T2N0 stage Ia Treatment plan: 1.  Neoadjuvant chemotherapy with Taxol  and Herceptin  weekly x12 followed by Herceptin  maintenance completed February 2023 2. breast conserving surgery 09/17/2021:Right breast lumpectomy ([facility]): IDC 2.1 cm 6 SLN negative for cancer 3.  Adjuvant radiation completed 02/20/2021 4.  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     Breast cancer surveillance: 1. Breast exam 05/01/2024: Benign 2. bone density  09/27/2022: T-score 0.2: Normal,  3. mammogram 01/06/2024: Suspicious 7 mm mass right breast at 10:00, 6 mm mass at 2:00,  both biopsies: Fat necrosis  Return to clinic in 1 year for follow-up   No orders of the defined types were placed in this encounter.  The patient has a good understanding of the overall plan. she agrees with it. she will call with any problems that may develop before the next visit here.  I personally spent a total of 30 minutes in the care of the patient today including preparing to see the patient, getting/reviewing separately obtained history, performing a medically appropriate exam/evaluation, counseling and educating, placing orders, referring and communicating with other health care professionals, documenting clinical information in the EHR, independently interpreting results, communicating results, and coordinating care.   Viinay K Prudie Guthridge, MD 05/01/24

## 2024-05-01 NOTE — Assessment & Plan Note (Signed)
 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  Neoadjuvant chemotherapy with Taxol  and Herceptin  weekly x12 followed by Herceptin  maintenance completed February 2023 2. breast conserving surgery 09/17/2021:Right breast lumpectomy ([facility]): IDC 2.1 cm 6 SLN negative for cancer 3.  Adjuvant radiation completed 02/20/2021 4.  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     Breast cancer surveillance: 1.  Breast exam 05/01/2024: Benign 2. bone density  09/27/2022: T-score 0.2: Normal,  3. mammogram 01/06/2024: Suspicious 7 mm mass right breast at 10:00, 6 mm mass at 2:00, both biopsies: Fat necrosis   Return to clinic in 1 year for follow-up   Return to clinic in 1 year for follow-up

## 2024-05-11 ENCOUNTER — Other Ambulatory Visit (HOSPITAL_COMMUNITY): Payer: Self-pay

## 2024-06-01 ENCOUNTER — Other Ambulatory Visit (HOSPITAL_COMMUNITY): Payer: Self-pay

## 2024-06-07 ENCOUNTER — Other Ambulatory Visit (HOSPITAL_COMMUNITY): Payer: Self-pay

## 2024-06-08 ENCOUNTER — Other Ambulatory Visit (HOSPITAL_COMMUNITY): Payer: Self-pay

## 2024-06-08 DIAGNOSIS — E559 Vitamin D deficiency, unspecified: Secondary | ICD-10-CM | POA: Diagnosis not present

## 2024-06-08 DIAGNOSIS — D649 Anemia, unspecified: Secondary | ICD-10-CM | POA: Diagnosis not present

## 2024-06-08 DIAGNOSIS — Z1212 Encounter for screening for malignant neoplasm of rectum: Secondary | ICD-10-CM | POA: Diagnosis not present

## 2024-06-08 MED ORDER — JANUVIA 100 MG PO TABS
100.0000 mg | ORAL_TABLET | Freq: Every day | ORAL | 3 refills | Status: AC
  Filled 2024-06-08: qty 90, 90d supply, fill #0
  Filled 2024-06-29: qty 30, 30d supply, fill #0

## 2024-06-08 MED ORDER — RAMIPRIL 2.5 MG PO CAPS
2.5000 mg | ORAL_CAPSULE | ORAL | 3 refills | Status: AC
  Filled 2024-06-08: qty 45, 90d supply, fill #0

## 2024-06-15 ENCOUNTER — Other Ambulatory Visit: Payer: Self-pay

## 2024-06-15 ENCOUNTER — Other Ambulatory Visit (HOSPITAL_COMMUNITY): Payer: Self-pay

## 2024-06-15 DIAGNOSIS — R82998 Other abnormal findings in urine: Secondary | ICD-10-CM | POA: Diagnosis not present

## 2024-06-15 MED ORDER — TRESIBA FLEXTOUCH 100 UNIT/ML ~~LOC~~ SOPN
PEN_INJECTOR | SUBCUTANEOUS | 3 refills | Status: AC
  Filled 2024-06-15: qty 15, 20d supply, fill #0
  Filled 2024-06-15: qty 30, 41d supply, fill #0
  Filled 2024-06-29: qty 45, 61d supply, fill #0
  Filled 2024-07-02: qty 45, 61d supply, fill #1

## 2024-06-15 MED ORDER — FREESTYLE LIBRE 3 PLUS SENSOR MISC
3 refills | Status: AC
  Filled 2024-06-15: qty 6, 90d supply, fill #0

## 2024-06-15 MED ORDER — INSULIN LISPRO (1 UNIT DIAL) 100 UNIT/ML (KWIKPEN)
14.0000 [IU] | PEN_INJECTOR | Freq: Two times a day (BID) | SUBCUTANEOUS | 3 refills | Status: AC
  Filled 2024-06-15: qty 27, 84d supply, fill #0
  Filled 2024-06-29: qty 9, 28d supply, fill #0

## 2024-06-15 MED ORDER — GABAPENTIN 300 MG PO CAPS
300.0000 mg | ORAL_CAPSULE | Freq: Three times a day (TID) | ORAL | 3 refills | Status: AC
  Filled 2024-06-15 – 2024-06-29 (×2): qty 90, 30d supply, fill #0

## 2024-06-15 MED ORDER — RAMIPRIL 2.5 MG PO CAPS
2.5000 mg | ORAL_CAPSULE | ORAL | 3 refills | Status: AC
  Filled 2024-06-15: qty 45, 90d supply, fill #0
  Filled 2024-06-29: qty 15, 30d supply, fill #0

## 2024-06-15 MED ORDER — JANUVIA 100 MG PO TABS
100.0000 mg | ORAL_TABLET | Freq: Every day | ORAL | 3 refills | Status: AC
  Filled 2024-06-15: qty 90, 90d supply, fill #0

## 2024-06-19 ENCOUNTER — Other Ambulatory Visit (HOSPITAL_COMMUNITY): Payer: Self-pay

## 2024-06-27 ENCOUNTER — Other Ambulatory Visit (HOSPITAL_COMMUNITY): Payer: Self-pay

## 2024-06-29 ENCOUNTER — Encounter (HOSPITAL_COMMUNITY): Payer: Self-pay

## 2024-06-29 ENCOUNTER — Other Ambulatory Visit (HOSPITAL_COMMUNITY): Payer: Self-pay

## 2024-07-02 ENCOUNTER — Other Ambulatory Visit: Payer: Self-pay

## 2024-07-02 ENCOUNTER — Other Ambulatory Visit (HOSPITAL_COMMUNITY): Payer: Self-pay

## 2024-07-20 ENCOUNTER — Other Ambulatory Visit (HOSPITAL_COMMUNITY): Payer: Self-pay

## 2024-07-30 ENCOUNTER — Other Ambulatory Visit (HOSPITAL_COMMUNITY): Payer: Self-pay

## 2024-08-01 ENCOUNTER — Other Ambulatory Visit (HOSPITAL_COMMUNITY): Payer: Self-pay

## 2024-08-02 ENCOUNTER — Other Ambulatory Visit (HOSPITAL_COMMUNITY): Payer: Self-pay

## 2025-02-18 ENCOUNTER — Ambulatory Visit: Admitting: Nurse Practitioner

## 2025-02-19 ENCOUNTER — Ambulatory Visit: Admitting: Nurse Practitioner

## 2025-05-02 ENCOUNTER — Inpatient Hospital Stay: Admitting: Hematology and Oncology
